# Patient Record
Sex: Male | Born: 1935 | Race: White | Hispanic: No | Smoking: Never smoker
Health system: Southern US, Community
[De-identification: ages and names within clinical notes are randomized; demographics above are authoritative.]

## PROBLEM LIST (undated history)

## (undated) DIAGNOSIS — Z9889 Other specified postprocedural states: Secondary | ICD-10-CM

## (undated) DIAGNOSIS — Z9049 Acquired absence of other specified parts of digestive tract: Secondary | ICD-10-CM

## (undated) DIAGNOSIS — I82409 Acute embolism and thrombosis of unspecified deep veins of unspecified lower extremity: Secondary | ICD-10-CM

## (undated) DIAGNOSIS — I639 Cerebral infarction, unspecified: Secondary | ICD-10-CM

## (undated) DIAGNOSIS — I6529 Occlusion and stenosis of unspecified carotid artery: Secondary | ICD-10-CM

## (undated) DIAGNOSIS — F0390 Unspecified dementia without behavioral disturbance: Secondary | ICD-10-CM

## (undated) DIAGNOSIS — E785 Hyperlipidemia, unspecified: Secondary | ICD-10-CM

## (undated) DIAGNOSIS — I2510 Atherosclerotic heart disease of native coronary artery without angina pectoris: Secondary | ICD-10-CM

## (undated) DIAGNOSIS — Z86711 Personal history of pulmonary embolism: Secondary | ICD-10-CM

## (undated) DIAGNOSIS — H269 Unspecified cataract: Secondary | ICD-10-CM

## (undated) DIAGNOSIS — Z8781 Personal history of (healed) traumatic fracture: Secondary | ICD-10-CM

## (undated) DIAGNOSIS — E039 Hypothyroidism, unspecified: Secondary | ICD-10-CM

## (undated) DIAGNOSIS — I350 Nonrheumatic aortic (valve) stenosis: Secondary | ICD-10-CM

## (undated) DIAGNOSIS — Z8619 Personal history of other infectious and parasitic diseases: Secondary | ICD-10-CM

## (undated) DIAGNOSIS — Z8679 Personal history of other diseases of the circulatory system: Secondary | ICD-10-CM

## (undated) DIAGNOSIS — R55 Syncope and collapse: Secondary | ICD-10-CM

## (undated) DIAGNOSIS — Z950 Presence of cardiac pacemaker: Secondary | ICD-10-CM

## (undated) HISTORY — PX: EYE SURGERY: SHX253

## (undated) HISTORY — DX: Other specified postprocedural states: Z98.890

## (undated) HISTORY — DX: Presence of cardiac pacemaker: Z95.0

## (undated) HISTORY — DX: Cerebral infarction, unspecified: I63.9

## (undated) HISTORY — DX: Nonrheumatic aortic (valve) stenosis: I35.0

## (undated) HISTORY — DX: Occlusion and stenosis of unspecified carotid artery: I65.29

## (undated) HISTORY — DX: Personal history of (healed) traumatic fracture: Z87.81

## (undated) HISTORY — DX: Personal history of pulmonary embolism: Z86.711

## (undated) HISTORY — PX: CARDIAC CATHETERIZATION: SHX172

## (undated) HISTORY — DX: Atherosclerotic heart disease of native coronary artery without angina pectoris: I25.10

## (undated) HISTORY — DX: Hyperlipidemia, unspecified: E78.5

## (undated) HISTORY — PX: VASCULAR SURGERY: SHX849

## (undated) HISTORY — DX: Acquired absence of other specified parts of digestive tract: Z90.49

## (undated) HISTORY — DX: Syncope and collapse: R55

## (undated) HISTORY — DX: Personal history of other diseases of the circulatory system: Z86.79

## (undated) HISTORY — DX: Hypothyroidism, unspecified: E03.9

## (undated) HISTORY — DX: Unspecified cataract: H26.9

## (undated) HISTORY — DX: Personal history of other infectious and parasitic diseases: Z86.19

## (undated) HISTORY — PX: BRAIN SURGERY: SHX531

---

## 1983-11-14 HISTORY — PX: AORTIC VALVE REPLACEMENT: SHX41

## 1998-08-23 ENCOUNTER — Encounter: Payer: Self-pay | Admitting: Cardiology

## 1998-08-23 ENCOUNTER — Inpatient Hospital Stay (HOSPITAL_COMMUNITY): Admission: AD | Admit: 1998-08-23 | Discharge: 1998-08-25 | Payer: Self-pay | Admitting: Cardiology

## 1998-08-24 ENCOUNTER — Encounter: Payer: Self-pay | Admitting: Pediatrics

## 1998-08-24 ENCOUNTER — Encounter: Payer: Self-pay | Admitting: Cardiology

## 2000-03-19 ENCOUNTER — Encounter: Payer: Self-pay | Admitting: Cardiology

## 2000-04-04 ENCOUNTER — Ambulatory Visit (HOSPITAL_COMMUNITY): Admission: RE | Admit: 2000-04-04 | Discharge: 2000-04-04 | Payer: Self-pay | Admitting: Cardiology

## 2003-03-11 ENCOUNTER — Ambulatory Visit (HOSPITAL_COMMUNITY): Admission: RE | Admit: 2003-03-11 | Discharge: 2003-03-11 | Payer: Self-pay | Admitting: Family Medicine

## 2003-03-11 ENCOUNTER — Encounter: Payer: Self-pay | Admitting: Family Medicine

## 2003-05-03 ENCOUNTER — Inpatient Hospital Stay (HOSPITAL_COMMUNITY): Admission: EM | Admit: 2003-05-03 | Discharge: 2003-05-06 | Payer: Self-pay | Admitting: Emergency Medicine

## 2003-05-03 ENCOUNTER — Encounter: Payer: Self-pay | Admitting: Emergency Medicine

## 2003-05-05 ENCOUNTER — Encounter: Payer: Self-pay | Admitting: Neurosurgery

## 2003-05-20 ENCOUNTER — Encounter: Payer: Self-pay | Admitting: Neurosurgery

## 2003-05-20 ENCOUNTER — Ambulatory Visit (HOSPITAL_COMMUNITY): Admission: RE | Admit: 2003-05-20 | Discharge: 2003-05-20 | Payer: Self-pay | Admitting: Neurosurgery

## 2003-06-03 ENCOUNTER — Ambulatory Visit (HOSPITAL_COMMUNITY): Admission: RE | Admit: 2003-06-03 | Discharge: 2003-06-03 | Payer: Self-pay | Admitting: Neurosurgery

## 2003-06-03 ENCOUNTER — Encounter: Payer: Self-pay | Admitting: Neurosurgery

## 2003-06-05 ENCOUNTER — Encounter: Payer: Self-pay | Admitting: Neurosurgery

## 2003-06-08 ENCOUNTER — Inpatient Hospital Stay (HOSPITAL_COMMUNITY): Admission: RE | Admit: 2003-06-08 | Discharge: 2003-06-15 | Payer: Self-pay | Admitting: Neurosurgery

## 2003-06-09 ENCOUNTER — Encounter: Payer: Self-pay | Admitting: Neurosurgery

## 2003-06-10 ENCOUNTER — Encounter: Payer: Self-pay | Admitting: Neurosurgery

## 2003-06-15 ENCOUNTER — Encounter: Payer: Self-pay | Admitting: Neurosurgery

## 2003-06-23 ENCOUNTER — Encounter: Payer: Self-pay | Admitting: Neurosurgery

## 2003-06-23 ENCOUNTER — Ambulatory Visit (HOSPITAL_COMMUNITY): Admission: RE | Admit: 2003-06-23 | Discharge: 2003-06-23 | Payer: Self-pay | Admitting: Neurosurgery

## 2003-06-25 ENCOUNTER — Inpatient Hospital Stay (HOSPITAL_COMMUNITY): Admission: EM | Admit: 2003-06-25 | Discharge: 2003-07-02 | Payer: Self-pay | Admitting: Emergency Medicine

## 2003-06-25 ENCOUNTER — Encounter: Payer: Self-pay | Admitting: Emergency Medicine

## 2003-06-29 ENCOUNTER — Encounter: Payer: Self-pay | Admitting: Cardiology

## 2003-07-08 ENCOUNTER — Encounter: Payer: Self-pay | Admitting: Neurosurgery

## 2003-07-08 ENCOUNTER — Inpatient Hospital Stay (HOSPITAL_COMMUNITY): Admission: RE | Admit: 2003-07-08 | Discharge: 2003-07-19 | Payer: Self-pay | Admitting: Neurosurgery

## 2003-07-13 ENCOUNTER — Encounter: Payer: Self-pay | Admitting: Neurosurgery

## 2003-07-14 ENCOUNTER — Encounter (INDEPENDENT_AMBULATORY_CARE_PROVIDER_SITE_OTHER): Payer: Self-pay | Admitting: Specialist

## 2003-07-14 ENCOUNTER — Encounter: Payer: Self-pay | Admitting: Neurosurgery

## 2003-07-15 ENCOUNTER — Encounter: Payer: Self-pay | Admitting: Neurosurgery

## 2003-07-17 ENCOUNTER — Encounter: Payer: Self-pay | Admitting: Neurosurgery

## 2003-07-19 ENCOUNTER — Encounter: Payer: Self-pay | Admitting: Neurosurgery

## 2003-07-30 ENCOUNTER — Inpatient Hospital Stay (HOSPITAL_COMMUNITY): Admission: EM | Admit: 2003-07-30 | Discharge: 2003-08-02 | Payer: Self-pay | Admitting: Emergency Medicine

## 2003-07-30 ENCOUNTER — Encounter: Payer: Self-pay | Admitting: Emergency Medicine

## 2003-07-31 ENCOUNTER — Encounter: Payer: Self-pay | Admitting: Cardiology

## 2003-08-02 ENCOUNTER — Encounter: Payer: Self-pay | Admitting: Neurosurgery

## 2003-08-05 ENCOUNTER — Encounter: Admission: RE | Admit: 2003-08-05 | Discharge: 2003-08-05 | Payer: Self-pay | Admitting: Family Medicine

## 2003-08-06 ENCOUNTER — Inpatient Hospital Stay (HOSPITAL_COMMUNITY): Admission: EM | Admit: 2003-08-06 | Discharge: 2003-08-21 | Payer: Self-pay | Admitting: Emergency Medicine

## 2003-08-10 ENCOUNTER — Encounter: Payer: Self-pay | Admitting: Family Medicine

## 2003-08-11 ENCOUNTER — Encounter: Payer: Self-pay | Admitting: Family Medicine

## 2003-08-17 ENCOUNTER — Encounter: Payer: Self-pay | Admitting: Family Medicine

## 2003-08-17 ENCOUNTER — Encounter: Payer: Self-pay | Admitting: Cardiology

## 2003-09-11 ENCOUNTER — Ambulatory Visit (HOSPITAL_COMMUNITY): Admission: RE | Admit: 2003-09-11 | Discharge: 2003-09-11 | Payer: Self-pay | Admitting: Neurosurgery

## 2003-10-30 ENCOUNTER — Ambulatory Visit (HOSPITAL_COMMUNITY): Admission: RE | Admit: 2003-10-30 | Discharge: 2003-10-30 | Payer: Self-pay | Admitting: Neurosurgery

## 2003-11-14 DIAGNOSIS — Z9049 Acquired absence of other specified parts of digestive tract: Secondary | ICD-10-CM

## 2003-11-14 HISTORY — DX: Acquired absence of other specified parts of digestive tract: Z90.49

## 2004-01-22 ENCOUNTER — Ambulatory Visit (HOSPITAL_COMMUNITY): Admission: RE | Admit: 2004-01-22 | Discharge: 2004-01-22 | Payer: Self-pay | Admitting: Neurosurgery

## 2004-07-21 ENCOUNTER — Ambulatory Visit (HOSPITAL_COMMUNITY): Admission: RE | Admit: 2004-07-21 | Discharge: 2004-07-21 | Payer: Self-pay | Admitting: Neurosurgery

## 2004-09-15 ENCOUNTER — Ambulatory Visit: Payer: Self-pay | Admitting: Internal Medicine

## 2004-09-29 ENCOUNTER — Ambulatory Visit: Payer: Self-pay | Admitting: Internal Medicine

## 2004-10-20 ENCOUNTER — Ambulatory Visit: Payer: Self-pay | Admitting: Cardiology

## 2004-10-20 ENCOUNTER — Inpatient Hospital Stay (HOSPITAL_COMMUNITY): Admission: EM | Admit: 2004-10-20 | Discharge: 2004-10-26 | Payer: Self-pay | Admitting: Emergency Medicine

## 2004-10-21 ENCOUNTER — Encounter: Payer: Self-pay | Admitting: Cardiology

## 2004-10-25 ENCOUNTER — Encounter (INDEPENDENT_AMBULATORY_CARE_PROVIDER_SITE_OTHER): Payer: Self-pay | Admitting: *Deleted

## 2004-10-31 ENCOUNTER — Ambulatory Visit: Payer: Self-pay | Admitting: Cardiovascular Disease

## 2004-10-31 ENCOUNTER — Ambulatory Visit: Payer: Self-pay

## 2004-11-21 ENCOUNTER — Ambulatory Visit: Payer: Self-pay | Admitting: Cardiology

## 2004-12-06 ENCOUNTER — Ambulatory Visit: Payer: Self-pay | Admitting: Cardiology

## 2004-12-16 ENCOUNTER — Ambulatory Visit: Payer: Self-pay | Admitting: Cardiovascular Disease

## 2004-12-30 ENCOUNTER — Ambulatory Visit: Payer: Self-pay | Admitting: Internal Medicine

## 2005-01-04 ENCOUNTER — Ambulatory Visit: Payer: Self-pay | Admitting: Cardiology

## 2005-01-23 ENCOUNTER — Ambulatory Visit: Payer: Self-pay | Admitting: Cardiology

## 2005-01-30 ENCOUNTER — Ambulatory Visit (HOSPITAL_COMMUNITY): Admission: RE | Admit: 2005-01-30 | Discharge: 2005-01-30 | Payer: Self-pay | Admitting: Family Medicine

## 2005-01-30 ENCOUNTER — Ambulatory Visit: Payer: Self-pay | Admitting: Cardiology

## 2005-02-07 ENCOUNTER — Ambulatory Visit: Payer: Self-pay | Admitting: Cardiology

## 2005-02-09 ENCOUNTER — Encounter: Admission: RE | Admit: 2005-02-09 | Discharge: 2005-03-21 | Payer: Self-pay | Admitting: Family Medicine

## 2005-02-14 ENCOUNTER — Ambulatory Visit: Payer: Self-pay | Admitting: Cardiology

## 2005-02-28 ENCOUNTER — Ambulatory Visit: Payer: Self-pay | Admitting: Cardiology

## 2005-03-16 ENCOUNTER — Ambulatory Visit: Payer: Self-pay | Admitting: Internal Medicine

## 2005-03-30 ENCOUNTER — Ambulatory Visit: Payer: Self-pay | Admitting: Cardiology

## 2005-04-14 ENCOUNTER — Ambulatory Visit: Payer: Self-pay | Admitting: *Deleted

## 2005-04-27 ENCOUNTER — Ambulatory Visit: Payer: Self-pay | Admitting: Internal Medicine

## 2005-05-15 ENCOUNTER — Inpatient Hospital Stay (HOSPITAL_COMMUNITY): Admission: RE | Admit: 2005-05-15 | Discharge: 2005-05-17 | Payer: Self-pay | Admitting: Orthopedic Surgery

## 2005-05-23 ENCOUNTER — Ambulatory Visit: Payer: Self-pay | Admitting: *Deleted

## 2005-05-24 ENCOUNTER — Encounter: Admission: RE | Admit: 2005-05-24 | Discharge: 2005-07-05 | Payer: Self-pay | Admitting: Orthopedic Surgery

## 2005-06-07 ENCOUNTER — Ambulatory Visit: Payer: Self-pay | Admitting: Cardiology

## 2005-06-22 ENCOUNTER — Ambulatory Visit: Payer: Self-pay | Admitting: Internal Medicine

## 2005-07-03 ENCOUNTER — Ambulatory Visit: Payer: Self-pay | Admitting: Cardiology

## 2005-07-06 ENCOUNTER — Encounter: Admission: RE | Admit: 2005-07-06 | Discharge: 2005-08-01 | Payer: Self-pay | Admitting: Orthopedic Surgery

## 2005-07-11 ENCOUNTER — Ambulatory Visit: Payer: Self-pay | Admitting: Cardiovascular Disease

## 2005-07-21 ENCOUNTER — Ambulatory Visit: Payer: Self-pay | Admitting: Internal Medicine

## 2005-08-02 ENCOUNTER — Ambulatory Visit: Payer: Self-pay | Admitting: Cardiology

## 2005-08-16 ENCOUNTER — Ambulatory Visit: Payer: Self-pay | Admitting: Cardiology

## 2005-09-06 ENCOUNTER — Ambulatory Visit: Payer: Self-pay | Admitting: Cardiology

## 2005-10-03 ENCOUNTER — Ambulatory Visit: Payer: Self-pay | Admitting: Cardiology

## 2005-10-17 ENCOUNTER — Ambulatory Visit: Payer: Self-pay | Admitting: Cardiology

## 2005-11-08 ENCOUNTER — Ambulatory Visit: Payer: Self-pay | Admitting: *Deleted

## 2005-12-06 ENCOUNTER — Ambulatory Visit: Payer: Self-pay | Admitting: Cardiology

## 2005-12-20 ENCOUNTER — Ambulatory Visit: Payer: Self-pay | Admitting: Cardiology

## 2005-12-29 ENCOUNTER — Ambulatory Visit: Payer: Self-pay | Admitting: Internal Medicine

## 2006-01-11 ENCOUNTER — Ambulatory Visit: Payer: Self-pay | Admitting: Cardiology

## 2006-02-08 ENCOUNTER — Ambulatory Visit: Payer: Self-pay | Admitting: Cardiology

## 2006-03-08 ENCOUNTER — Ambulatory Visit: Payer: Self-pay | Admitting: Cardiology

## 2006-03-14 ENCOUNTER — Encounter: Payer: Self-pay | Admitting: Cardiology

## 2006-04-05 ENCOUNTER — Ambulatory Visit: Payer: Self-pay | Admitting: Cardiology

## 2006-04-16 ENCOUNTER — Ambulatory Visit: Payer: Self-pay | Admitting: Cardiology

## 2006-05-15 ENCOUNTER — Ambulatory Visit: Payer: Self-pay | Admitting: Internal Medicine

## 2006-05-15 ENCOUNTER — Ambulatory Visit: Payer: Self-pay | Admitting: Cardiology

## 2006-05-29 ENCOUNTER — Ambulatory Visit: Payer: Self-pay | Admitting: Cardiology

## 2006-06-19 ENCOUNTER — Ambulatory Visit: Payer: Self-pay | Admitting: Cardiology

## 2006-07-10 ENCOUNTER — Ambulatory Visit: Payer: Self-pay | Admitting: *Deleted

## 2006-08-07 ENCOUNTER — Ambulatory Visit: Payer: Self-pay | Admitting: Cardiovascular Disease

## 2006-09-04 ENCOUNTER — Ambulatory Visit: Payer: Self-pay | Admitting: Internal Medicine

## 2006-10-02 ENCOUNTER — Ambulatory Visit: Payer: Self-pay | Admitting: Internal Medicine

## 2006-10-23 ENCOUNTER — Ambulatory Visit: Payer: Self-pay | Admitting: Cardiovascular Disease

## 2006-11-20 ENCOUNTER — Ambulatory Visit: Payer: Self-pay | Admitting: Cardiology

## 2006-12-18 ENCOUNTER — Ambulatory Visit: Payer: Self-pay | Admitting: Cardiology

## 2007-01-15 ENCOUNTER — Ambulatory Visit: Payer: Self-pay | Admitting: Cardiology

## 2007-02-12 ENCOUNTER — Ambulatory Visit: Payer: Self-pay | Admitting: Internal Medicine

## 2007-02-26 ENCOUNTER — Ambulatory Visit: Payer: Self-pay | Admitting: *Deleted

## 2007-03-26 ENCOUNTER — Ambulatory Visit: Payer: Self-pay | Admitting: Cardiology

## 2007-04-23 ENCOUNTER — Ambulatory Visit: Payer: Self-pay | Admitting: Cardiology

## 2007-05-14 ENCOUNTER — Ambulatory Visit: Payer: Self-pay | Admitting: Cardiology

## 2007-05-21 ENCOUNTER — Ambulatory Visit: Payer: Self-pay | Admitting: Cardiology

## 2007-05-21 LAB — CONVERTED CEMR LAB
ALT: 16 units/L (ref 0–53)
AST: 31 units/L (ref 0–37)
Albumin: 3.8 g/dL (ref 3.5–5.2)
Alkaline Phosphatase: 62 units/L (ref 39–117)
BUN: 13 mg/dL (ref 6–23)
Basophils Absolute: 0 10*3/uL (ref 0.0–0.1)
Basophils Relative: 0.1 % (ref 0.0–1.0)
Bilirubin, Direct: 0.1 mg/dL (ref 0.0–0.3)
CO2: 31 meq/L (ref 19–32)
Calcium: 8.7 mg/dL (ref 8.4–10.5)
Chloride: 109 meq/L (ref 96–112)
Cholesterol: 144 mg/dL (ref 0–200)
Creatinine, Ser: 1.1 mg/dL (ref 0.4–1.5)
Eosinophils Absolute: 0.1 10*3/uL (ref 0.0–0.6)
Eosinophils Relative: 1.9 % (ref 0.0–5.0)
GFR calc Af Amer: 84 mL/min
GFR calc non Af Amer: 69 mL/min
Glucose, Bld: 102 mg/dL — ABNORMAL HIGH (ref 70–99)
HCT: 36.7 % — ABNORMAL LOW (ref 39.0–52.0)
HDL: 50.4 mg/dL (ref >39.0)
Hemoglobin: 12.8 g/dL — ABNORMAL LOW (ref 13.0–17.0)
LDL Cholesterol: 82 mg/dL (ref 0–99)
Lymphocytes Relative: 22.4 % (ref 12.0–46.0)
MCHC: 35 g/dL (ref 30.0–36.0)
MCV: 97.5 fL (ref 78.0–100.0)
Monocytes Absolute: 0.4 10*3/uL (ref 0.2–0.7)
Monocytes Relative: 12.3 % — ABNORMAL HIGH (ref 3.0–11.0)
Neutro Abs: 2.1 10*3/uL (ref 1.4–7.7)
Neutrophils Relative %: 63.3 % (ref 43.0–77.0)
Platelets: 135 10*3/uL — ABNORMAL LOW (ref 150–400)
Potassium: 4.7 meq/L (ref 3.5–5.1)
RBC: 3.76 M/uL — ABNORMAL LOW (ref 4.22–5.81)
RDW: 12 % (ref 11.5–14.6)
Sodium: 142 meq/L (ref 135–145)
TSH: 3.25 microintl units/mL (ref 0.35–5.50)
Total Bilirubin: 1.2 mg/dL (ref 0.3–1.2)
Total CHOL/HDL Ratio: 2.9
Total Protein: 6.8 g/dL (ref 6.0–8.3)
Triglycerides: 57 mg/dL (ref 0–149)
VLDL: 11 mg/dL (ref 0–40)
WBC: 3.4 10*3/uL — ABNORMAL LOW (ref 4.5–10.5)

## 2007-06-17 ENCOUNTER — Ambulatory Visit: Payer: Self-pay | Admitting: Internal Medicine

## 2007-07-16 ENCOUNTER — Ambulatory Visit: Payer: Self-pay | Admitting: Cardiology

## 2007-07-29 ENCOUNTER — Ambulatory Visit: Payer: Self-pay | Admitting: Internal Medicine

## 2007-08-19 ENCOUNTER — Ambulatory Visit: Payer: Self-pay | Admitting: Internal Medicine

## 2007-09-16 ENCOUNTER — Ambulatory Visit: Payer: Self-pay | Admitting: Cardiovascular Disease

## 2007-09-26 ENCOUNTER — Ambulatory Visit: Payer: Self-pay | Admitting: Cardiology

## 2007-10-15 ENCOUNTER — Ambulatory Visit: Payer: Self-pay | Admitting: Cardiology

## 2007-10-29 ENCOUNTER — Ambulatory Visit: Payer: Self-pay | Admitting: Internal Medicine

## 2007-11-26 ENCOUNTER — Ambulatory Visit: Payer: Self-pay | Admitting: Cardiology

## 2007-12-17 ENCOUNTER — Ambulatory Visit: Payer: Self-pay | Admitting: Cardiology

## 2008-01-14 ENCOUNTER — Ambulatory Visit: Payer: Self-pay | Admitting: Internal Medicine

## 2008-02-11 ENCOUNTER — Ambulatory Visit: Payer: Self-pay | Admitting: Internal Medicine

## 2008-03-10 ENCOUNTER — Ambulatory Visit: Payer: Self-pay | Admitting: Cardiology

## 2008-04-07 ENCOUNTER — Ambulatory Visit: Payer: Self-pay | Admitting: Cardiology

## 2008-05-05 ENCOUNTER — Ambulatory Visit: Payer: Self-pay | Admitting: Cardiovascular Disease

## 2008-05-13 ENCOUNTER — Ambulatory Visit: Payer: Self-pay | Admitting: Cardiology

## 2008-05-19 ENCOUNTER — Ambulatory Visit: Payer: Self-pay | Admitting: Cardiovascular Disease

## 2008-05-19 ENCOUNTER — Ambulatory Visit: Payer: Self-pay | Admitting: Cardiology

## 2008-05-19 LAB — CONVERTED CEMR LAB
ALT: 14 units/L (ref 0–53)
AST: 30 units/L (ref 0–37)
Albumin: 3.9 g/dL (ref 3.5–5.2)
Alkaline Phosphatase: 63 units/L (ref 39–117)
BUN: 12 mg/dL (ref 6–23)
Basophils Absolute: 0 10*3/uL (ref 0.0–0.1)
Basophils Relative: 0.7 % (ref 0.0–1.0)
Bilirubin, Direct: 0.1 mg/dL (ref 0.0–0.3)
CO2: 29 meq/L (ref 19–32)
Calcium: 8.6 mg/dL (ref 8.4–10.5)
Chloride: 103 meq/L (ref 96–112)
Cholesterol: 132 mg/dL (ref 0–200)
Creatinine, Ser: 1.1 mg/dL (ref 0.4–1.5)
Eosinophils Absolute: 0.1 10*3/uL (ref 0.0–0.7)
Eosinophils Relative: 1.7 % (ref 0.0–5.0)
GFR calc Af Amer: 83 mL/min
GFR calc non Af Amer: 69 mL/min
Glucose, Bld: 103 mg/dL — ABNORMAL HIGH (ref 70–99)
HCT: 37.4 % — ABNORMAL LOW (ref 39.0–52.0)
HDL: 48.2 mg/dL (ref >39.0)
Hemoglobin: 13.2 g/dL (ref 13.0–17.0)
LDL Cholesterol: 73 mg/dL (ref 0–99)
Lymphocytes Relative: 25.1 % (ref 12.0–46.0)
MCHC: 35.4 g/dL (ref 30.0–36.0)
MCV: 96.1 fL (ref 78.0–100.0)
Monocytes Absolute: 0.3 10*3/uL (ref 0.1–1.0)
Monocytes Relative: 10.2 % (ref 3.0–12.0)
Neutro Abs: 1.8 10*3/uL (ref 1.4–7.7)
Neutrophils Relative %: 62.3 % (ref 43.0–77.0)
Platelets: 138 10*3/uL — ABNORMAL LOW (ref 150–400)
Potassium: 4.3 meq/L (ref 3.5–5.1)
RBC: 3.89 M/uL — ABNORMAL LOW (ref 4.22–5.81)
RDW: 12.2 % (ref 11.5–14.6)
Sodium: 137 meq/L (ref 135–145)
TSH: 3.5 microintl units/mL (ref 0.35–5.50)
Total Bilirubin: 1.1 mg/dL (ref 0.3–1.2)
Total CHOL/HDL Ratio: 2.7
Total Protein: 7 g/dL (ref 6.0–8.3)
Triglycerides: 54 mg/dL (ref 0–149)
VLDL: 11 mg/dL (ref 0–40)
WBC: 3 10*3/uL — ABNORMAL LOW (ref 4.5–10.5)

## 2008-06-16 ENCOUNTER — Ambulatory Visit: Payer: Self-pay | Admitting: Cardiology

## 2008-07-15 ENCOUNTER — Ambulatory Visit: Payer: Self-pay | Admitting: Internal Medicine

## 2008-08-12 ENCOUNTER — Ambulatory Visit: Payer: Self-pay | Admitting: Cardiovascular Disease

## 2008-09-09 ENCOUNTER — Ambulatory Visit: Payer: Self-pay | Admitting: Cardiology

## 2008-10-07 ENCOUNTER — Ambulatory Visit: Payer: Self-pay | Admitting: Cardiovascular Disease

## 2008-11-04 ENCOUNTER — Ambulatory Visit: Payer: Self-pay | Admitting: Cardiovascular Disease

## 2008-12-02 ENCOUNTER — Ambulatory Visit: Payer: Self-pay | Admitting: Internal Medicine

## 2008-12-30 ENCOUNTER — Ambulatory Visit: Payer: Self-pay | Admitting: Cardiology

## 2009-01-27 ENCOUNTER — Ambulatory Visit: Payer: Self-pay | Admitting: Cardiology

## 2009-02-24 ENCOUNTER — Ambulatory Visit: Payer: Self-pay | Admitting: Internal Medicine

## 2009-03-24 ENCOUNTER — Ambulatory Visit: Payer: Self-pay | Admitting: Cardiology

## 2009-04-13 ENCOUNTER — Encounter: Payer: Self-pay | Admitting: *Deleted

## 2009-04-21 ENCOUNTER — Ambulatory Visit: Payer: Self-pay | Admitting: Cardiology

## 2009-04-21 LAB — CONVERTED CEMR LAB
POC INR: 2.8
Protime: 20.1

## 2009-05-12 ENCOUNTER — Ambulatory Visit: Payer: Self-pay | Admitting: Internal Medicine

## 2009-05-12 LAB — CONVERTED CEMR LAB
POC INR: 2.3
Prothrombin Time: 18.4 s

## 2009-05-19 ENCOUNTER — Encounter: Payer: Self-pay | Admitting: *Deleted

## 2009-06-05 DIAGNOSIS — E785 Hyperlipidemia, unspecified: Secondary | ICD-10-CM | POA: Insufficient documentation

## 2009-06-08 DIAGNOSIS — Z9889 Other specified postprocedural states: Secondary | ICD-10-CM | POA: Insufficient documentation

## 2009-06-08 DIAGNOSIS — I82409 Acute embolism and thrombosis of unspecified deep veins of unspecified lower extremity: Secondary | ICD-10-CM | POA: Insufficient documentation

## 2009-06-08 DIAGNOSIS — I6200 Nontraumatic subdural hemorrhage, unspecified: Secondary | ICD-10-CM | POA: Insufficient documentation

## 2009-06-09 ENCOUNTER — Encounter: Payer: Self-pay | Admitting: Cardiology

## 2009-06-09 ENCOUNTER — Ambulatory Visit: Payer: Self-pay | Admitting: Cardiology

## 2009-06-09 DIAGNOSIS — I2510 Atherosclerotic heart disease of native coronary artery without angina pectoris: Secondary | ICD-10-CM

## 2009-06-09 LAB — CONVERTED CEMR LAB
POC INR: 1.8
Prothrombin Time: 16.7 s

## 2009-07-07 ENCOUNTER — Ambulatory Visit: Payer: Self-pay | Admitting: Internal Medicine

## 2009-07-07 LAB — CONVERTED CEMR LAB: POC INR: 2.7

## 2009-08-02 ENCOUNTER — Ambulatory Visit: Payer: Self-pay | Admitting: Cardiology

## 2009-08-02 LAB — CONVERTED CEMR LAB: POC INR: 2.6

## 2009-08-30 ENCOUNTER — Ambulatory Visit: Payer: Self-pay | Admitting: Cardiovascular Disease

## 2009-08-30 LAB — CONVERTED CEMR LAB: POC INR: 2.2

## 2009-09-27 ENCOUNTER — Ambulatory Visit: Payer: Self-pay | Admitting: Cardiology

## 2009-09-27 LAB — CONVERTED CEMR LAB: POC INR: 2.3

## 2009-10-25 ENCOUNTER — Ambulatory Visit: Payer: Self-pay | Admitting: Cardiovascular Disease

## 2009-10-25 LAB — CONVERTED CEMR LAB: POC INR: 2.1

## 2009-11-02 ENCOUNTER — Telehealth: Payer: Self-pay | Admitting: Cardiology

## 2009-11-15 ENCOUNTER — Telehealth: Payer: Self-pay | Admitting: Cardiology

## 2009-11-22 ENCOUNTER — Encounter (INDEPENDENT_AMBULATORY_CARE_PROVIDER_SITE_OTHER): Payer: Self-pay | Admitting: Cardiology

## 2009-11-22 ENCOUNTER — Ambulatory Visit: Payer: Self-pay | Admitting: Internal Medicine

## 2009-12-20 ENCOUNTER — Ambulatory Visit: Payer: Self-pay | Admitting: Cardiovascular Disease

## 2010-01-10 ENCOUNTER — Encounter (INDEPENDENT_AMBULATORY_CARE_PROVIDER_SITE_OTHER): Payer: Self-pay | Admitting: Cardiology

## 2010-01-10 ENCOUNTER — Ambulatory Visit: Payer: Self-pay | Admitting: Cardiology

## 2010-02-07 ENCOUNTER — Ambulatory Visit: Payer: Self-pay | Admitting: Cardiovascular Disease

## 2010-02-07 LAB — CONVERTED CEMR LAB: POC INR: 2.1

## 2010-03-07 ENCOUNTER — Ambulatory Visit: Payer: Self-pay | Admitting: Cardiovascular Disease

## 2010-04-04 ENCOUNTER — Ambulatory Visit: Payer: Self-pay | Admitting: Cardiology

## 2010-05-02 ENCOUNTER — Ambulatory Visit: Payer: Self-pay | Admitting: Cardiology

## 2010-05-02 LAB — CONVERTED CEMR LAB: POC INR: 1.9

## 2010-05-30 ENCOUNTER — Ambulatory Visit: Payer: Self-pay | Admitting: Cardiology

## 2010-05-30 LAB — CONVERTED CEMR LAB: POC INR: 2

## 2010-06-07 ENCOUNTER — Ambulatory Visit: Payer: Self-pay | Admitting: Cardiology

## 2010-06-27 ENCOUNTER — Ambulatory Visit: Payer: Self-pay | Admitting: Cardiology

## 2010-07-25 ENCOUNTER — Ambulatory Visit: Payer: Self-pay | Admitting: Internal Medicine

## 2010-07-25 LAB — CONVERTED CEMR LAB: POC INR: 2.1

## 2010-08-22 ENCOUNTER — Ambulatory Visit: Payer: Self-pay | Admitting: Internal Medicine

## 2010-08-22 LAB — CONVERTED CEMR LAB: POC INR: 2.3

## 2010-09-15 ENCOUNTER — Ambulatory Visit: Payer: Self-pay | Admitting: Cardiology

## 2010-10-13 ENCOUNTER — Ambulatory Visit: Payer: Self-pay | Admitting: Cardiovascular Disease

## 2010-11-10 ENCOUNTER — Ambulatory Visit: Payer: Self-pay | Admitting: Cardiology

## 2010-11-13 DIAGNOSIS — I639 Cerebral infarction, unspecified: Secondary | ICD-10-CM

## 2010-11-13 HISTORY — DX: Cerebral infarction, unspecified: I63.9

## 2010-12-07 ENCOUNTER — Ambulatory Visit (HOSPITAL_COMMUNITY)
Admission: RE | Admit: 2010-12-07 | Discharge: 2010-12-07 | Payer: Self-pay | Source: Home / Self Care | Attending: Family Medicine | Admitting: Family Medicine

## 2010-12-07 ENCOUNTER — Ambulatory Visit: Admission: RE | Admit: 2010-12-07 | Discharge: 2010-12-07 | Payer: Self-pay | Source: Home / Self Care

## 2010-12-07 LAB — CONVERTED CEMR LAB: POC INR: 2

## 2010-12-13 NOTE — Medication Information (Signed)
Summary: rov/sak  Anticoagulant Therapy  Managed by: Reina Fuse, PharmD Referring MD: Charlies Constable MD PCP: [facility] pratice-dr Elana Alm Supervising MD: Tenny Craw MD, Gunnar Fusi Indication 1: Aortic Valve Replacement (ICD-V43.3) Indication 2: Aortic Valve Disorder (ICD-424.1) Lab Used: [facility] Site: [facility] INR POC 2.3 INR RANGE 2 - 2.5  Dietary changes: no    Health status changes: no    Bleeding/hemorrhagic complications: no    Recent/future hospitalizations: no    Any changes in medication regimen? no    Recent/future dental: no  Any missed doses?: no       Is patient compliant with meds? yes       Current Medications (verified): 1)  Coumadin 3 Mg Tabs (Warfarin Sodium) .... Take As Directed By Coumadin Clinic. 2)  Flomax 0.4 Mg Caps (Tamsulosin Hcl) .Marland Kitchen.. 1 Tab  Day 3)  Vytorin 10-40 Mg Tabs (Ezetimibe-Simvastatin) .Marland Kitchen.. 1 Tab A Day 4)  Levoxyl 50 Mcg Tabs (Levothyroxine Sodium) .Marland Kitchen.. 1 Tab A Day 5)  Multivitamins   Tabs (Multiple Vitamin)  Allergies (verified): 1)  ! Sulfa  Anticoagulation Management History:      The patient is taking warfarin and comes in today for a routine follow up visit.  Positive risk factors for bleeding include an age of 75 years or older.  The bleeding index is 'intermediate risk'.  Positive CHADS2 values include Age > 48 years old.  The start date was 02/25/1998.  Anticoagulation responsible provider: Tenny Craw MD, Gunnar Fusi.  INR POC: 2.3.  Cuvette Lot#: [id number].  Exp: 09/2011.    Anticoagulation Management Assessment/Plan:      The patient's current anticoagulation dose is Coumadin 3 mg tabs: Take as directed by coumadin clinic..  The target INR is 2.0-2.5.  The next INR is due 09/19/2010.  Anticoagulation instructions were given to patient.  Results were reviewed/authorized by Reina Fuse, PharmD.  He was notified by Reina Fuse, PharmD.         Prior Anticoagulation Instructions: INR 2.1  Continue taking 1 tablet (3 mg) every day except 1/2  tablet (1.5 mg) on Monday's and Friday's.  Recheck in 4 wks  Current Anticoagulation Instructions: INR 2.3  Continue taking Coumadin 1 tab (3 mg) on all days except Coumadin 0.5 tab (1.5 mg) on Mondays and Fridays. Return to clinic in 4 weeks.

## 2010-12-13 NOTE — Medication Information (Signed)
Summary: rov/tm  Anticoagulant Therapy  Managed by: Weston Brass, PharmD Referring MD: Charlies Constable MD PCP: [facility] family pratice-dr Elana Alm Supervising MD: Shirlee Latch MD, Dalton Indication 1: Aortic Valve Replacement (ICD-V43.3) Indication 2: Aortic Valve Disorder (ICD-424.1) Lab Used: [facility] Site: [facility] INR POC 1.9 INR RANGE 2 - 2.5  Dietary changes: no    Health status changes: no    Bleeding/hemorrhagic complications: no    Recent/future hospitalizations: no    Any changes in medication regimen? no    Recent/future dental: no  Any missed doses?: no       Is patient compliant with meds? yes       Allergies: 1)  ! Sulfa  Anticoagulation Management History:      The patient is taking warfarin and comes in today for a routine follow up visit.  Positive risk factors for bleeding include an age of 29 years or older.  The bleeding index is 'intermediate risk'.  Positive CHADS2 values include Age > 71 years old.  The start date was 02/25/1998.  Anticoagulation responsible provider: Shirlee Latch MD, Dalton.  INR POC: 1.9.  Cuvette Lot#: [id number].  Exp: 08/2011.    Anticoagulation Management Assessment/Plan:      The patient's current anticoagulation dose is Coumadin 3 mg tabs: Take as directed by coumadin clinic..  The target INR is 2.0-2.5.  The next INR is due 07/25/2010.  Anticoagulation instructions were given to patient.  Results were reviewed/authorized by Weston Brass, PharmD.  He was notified by Gweneth Fritter, PharmD Candidate.         Prior Anticoagulation Instructions: INR 2.0 Continue 3mg s daily except 1.5mg s on Mondays and Fridays. Recheck in 4 weeks.   Current Anticoagulation Instructions: INR 1.9  Take 1 tablet (3mg ) tonight.  Then resume normal schedule of taking 1 tablet (3mg ) every day except take 1/2 tablet (1.5mg ) on Mondays and Fridays.  Recheck in 4 weeks.

## 2010-12-13 NOTE — Medication Information (Signed)
Summary: rov/ewj  Anticoagulant Therapy  Managed by: Bethena Midget, RN, BSN Referring MD: Charlies Constable MD PCP: [facility] family pratice-dr Tomasita Crumble MD: Antoine Poche MD, Fayrene Fearing Indication 1: Aortic Valve Replacement (ICD-V43.3) Indication 2: Aortic Valve Disorder (ICD-424.1) Lab Used[facility]  Site: [address] INR POC 2.0 INR RANGE 2 - 2.5  Dietary changes: no    Health status changes: no    Bleeding/hemorrhagic complications: no    Recent/future hospitalizations: no    Any changes in medication regimen? no    Recent/future dental: no  Any missed doses?: no       Is patient compliant with meds? yes       Allergies: 1)  ! Sulfa  Anticoagulation Management History:      The patient comes in today for his initial visit for anticoagulation therapy.  Positive risk factors for bleeding include an age of 75 years or older.  The bleeding index is 'intermediate risk'.  Positive CHADS2 values include Age > 86 years old.  The start date was 02/25/1998.  Anticoagulation responsible provider: Antoine Poche MD, Fayrene Fearing.  INR POC: 2.0.  Cuvette Lot#: [id number].  Exp: 08/2011.    Anticoagulation Management Assessment/Plan:      The patient's current anticoagulation dose is Coumadin 3 mg tabs: Take as directed by coumadin clinic..  The target INR is 2.0-2.5.  The next INR is due 06/27/2010.  Anticoagulation instructions were given to patient.  Results were reviewed/authorized by Bethena Midget, RN, BSN.  He was notified by Bethena Midget, RN, BSN.         Prior Anticoagulation Instructions: INR 1.9  Take 1 tablet today, then resume same dosage 1 tablet daily except 1/2 tablet on Mondays and Fridays.  Recheck in 4 weeks.    Current Anticoagulation Instructions: INR 2.0 Continue 3mg s daily except 1.5mg s on Mondays and Fridays. Recheck in 4 weeks.

## 2010-12-13 NOTE — Medication Information (Signed)
Summary: ROV/SP  Anticoagulant Therapy  Managed by: Eda Keys, PharmD Referring MD: Charlies Constable MD PCP: [facility] family pratice-dr Elana Alm Supervising MD: Jens Som MD, Arlys John Indication 1: Aortic Valve Replacement (ICD-V43.3) Indication 2: Aortic Valve Disorder (ICD-424.1) Lab Used: [facility] Site: [facility] INR POC 2.2 INR RANGE 2 - 2.5  Dietary changes: no    Health status changes: no    Bleeding/hemorrhagic complications: no    Recent/future hospitalizations: no    Any changes in medication regimen? no    Recent/future dental: no  Any missed doses?: no       Is patient compliant with meds? yes       Allergies: 1)  ! Sulfa  Anticoagulation Management History:      The patient is taking warfarin and comes in today for a routine follow up visit.  Positive risk factors for bleeding include an age of 75 years or older.  The bleeding index is 'intermediate risk'.  Positive CHADS2 values include Age > 75 years old.  The start date was 02/25/1998.  Anticoagulation responsible provider: Jens Som MD, Arlys John.  INR POC: 2.2.  Cuvette Lot#: [id number].  Exp: 06/2011.    Anticoagulation Management Assessment/Plan:      The patient's current anticoagulation dose is Coumadin 3 mg tabs: Take as directed by coumadin clinic..  The target INR is 2.0-2.5.  The next INR is due 05/02/2010.  Anticoagulation instructions were given to patient.  Results were reviewed/authorized by Eda Keys, PharmD.  He was notified by Eda Keys.         Prior Anticoagulation Instructions: INR 2.1  Continue same dose of 1 tablet every day except 1/2 tablet on Monday and Friday   Current Anticoagulation Instructions: INR 2.2  Take 1/2 tablet on Monday and Friday and 1 tablet all other days.  Return to clinic in 4 weeks.

## 2010-12-13 NOTE — Medication Information (Signed)
Summary: rov/tm  Anticoagulant Therapy  Managed by: Weston Brass, PharmD Referring MD: Charlies Constable MD PCP: [facility] pratice-dr Elana Alm Supervising MD: Excell Seltzer MD, Casimiro Needle Indication 1: Aortic Valve Replacement (ICD-V43.3) Indication 2: Aortic Valve Disorder (ICD-424.1) Lab Used: [facility] Site: [facility] INR POC 2.1 INR RANGE 2 - 2.5  Dietary changes: no    Health status changes: no    Bleeding/hemorrhagic complications: no    Recent/future hospitalizations: no    Any changes in medication regimen? no    Recent/future dental: no  Any missed doses?: no       Is patient compliant with meds? yes       Allergies: 1)  ! Sulfa  Anticoagulation Management History:      The patient is taking warfarin and comes in today for a routine follow up visit.  Positive risk factors for bleeding include an age of 75 years or older.  The bleeding index is 'intermediate risk'.  Positive CHADS2 values include Age > 75 years old.  The start date was 02/25/1998.  Anticoagulation responsible provider: Excell Seltzer MD, Casimiro Needle.  INR POC: 2.1.  Exp: 03/2011.    Anticoagulation Management Assessment/Plan:      The patient's current anticoagulation dose is Coumadin 3 mg tabs: Take as directed by coumadin clinic..  The target INR is 2.0-2.5.  The next INR is due 04/04/2010.  Anticoagulation instructions were given to patient.  Results were reviewed/authorized by Weston Brass, PharmD.  He was notified by Weston Brass PharmD.         Prior Anticoagulation Instructions: INR 2.1 Continue 3mg s everyday except 1.5mg s on Mondays and Fridays. Recheck in 4 weeks.   Current Anticoagulation Instructions: INR 2.1  Continue same dose of 1 tablet every day except 1/2 tablet on Monday and Friday

## 2010-12-13 NOTE — Letter (Signed)
Summary: Handout Printed  Printed Handout:  - Coumadin Instructions-w/out Meds 

## 2010-12-13 NOTE — Medication Information (Signed)
Summary: rov/eac  Anticoagulant Therapy  Managed by: Cloyde Reams, RN, BSN Referring MD: Charlies Constable MD PCP: [facility] family pratice-dr Tomasita Crumble MD: Shirlee Latch MD, Keven Soucy Indication 1: Aortic Valve Replacement (ICD-V43.3) Indication 2: Aortic Valve Disorder (ICD-424.1) Lab Used: [facility] Site: [address] INR POC 1.9 INR RANGE 2 - 2.5  Dietary changes: yes       Details: Incr vegetables from garden.   Health status changes: no    Bleeding/hemorrhagic complications: no    Recent/future hospitalizations: no    Any changes in medication regimen? no    Recent/future dental: no  Any missed doses?: no       Is patient compliant with meds? yes       Allergies: 1)  ! Sulfa  Anticoagulation Management History:      The patient is taking warfarin and comes in today for a routine follow up visit.  Positive risk factors for bleeding include an age of 75 years or older.  The bleeding index is 'intermediate risk'.  Positive CHADS2 values include Age > 75 years old.  The start date was 02/25/1998.  Anticoagulation responsible provider: Shirlee Latch MD, Joclyn Alsobrook.  INR POC: 1.9.  Cuvette Lot#: [id number].  Exp: 07/2011.    Anticoagulation Management Assessment/Plan:      The patient's current anticoagulation dose is Coumadin 3 mg tabs: Take as directed by coumadin clinic..  The target INR is 2.0-2.5.  The next INR is due 05/30/2010.  Anticoagulation instructions were given to patient.  Results were reviewed/authorized by Cloyde Reams, RN, BSN.  He was notified by Cloyde Reams RN.         Prior Anticoagulation Instructions: INR 2.2  Take 1/2 tablet on Monday and Friday and 1 tablet all other days.  Return to clinic in 4 weeks.      Current Anticoagulation Instructions: INR 1.9  Take 1 tablet today, then resume same dosage 1 tablet daily except 1/2 tablet on Mondays and Fridays.  Recheck in 4 weeks.

## 2010-12-13 NOTE — Medication Information (Signed)
Summary: [email or web address].mp  Anticoagulant Therapy  Managed by: Bethena Midget, RN, BSN Referring MD: Charlies Constable MD PCP: [facility] family pratice-dr Tomasita Crumble MD: Clifton James MD, Cristal Deer Indication 1: Aortic Valve Replacement (ICD-V43.3) Indication 2: Aortic Valve Disorder (ICD-424.1) Lab Used: [facility] Site: [address] INR POC 2.1 INR RANGE 2 - 2.5  Dietary changes: no    Health status changes: no    Bleeding/hemorrhagic complications: no    Recent/future hospitalizations: no    Any changes in medication regimen? no    Recent/future dental: no  Any missed doses?: no       Is patient compliant with meds? yes       Allergies: 1)  ! Sulfa  Anticoagulation Management History:      The patient is taking warfarin and comes in today for a routine follow up visit.  Positive risk factors for bleeding include an age of 76 years or older.  The bleeding index is 'intermediate risk'.  Positive CHADS2 values include Age > 1 years old.  The start date was 02/25/1998.  Anticoagulation responsible provider: Clifton James MD, Cristal Deer.  INR POC: 2.1.  Cuvette Lot#: [id number].  Exp: 03/2011.    Anticoagulation Management Assessment/Plan:      The patient's current anticoagulation dose is Coumadin 3 mg tabs: Take as directed by coumadin clinic..  The target INR is 2.0-2.5.  The next INR is due 03/07/2010.  Anticoagulation instructions were given to patient.  Results were reviewed/authorized by Bethena Midget, RN, BSN.  He was notified by Bethena Midget, RN, BSN.         Prior Anticoagulation Instructions: INR 2.3  Continue 0.5 tab each Monday and Friday and 1 tab on all other days.  Recheck in 4 weeks.   Current Anticoagulation Instructions: INR 2.1 Continue 3mg s everyday except 1.5mg s on Mondays and Fridays. Recheck in 4 weeks.

## 2010-12-13 NOTE — Medication Information (Signed)
Summary: rov/ewj  Anticoagulant Therapy  Managed by: Weston Brass, PharmD Referring MD: Charlies Constable MD PCP: [facility] family pratice-dr Elana Alm Supervising MD: Eden Emms MD, Theron Arista Indication 1: Aortic Valve Replacement (ICD-V43.3) Indication 2: Aortic Valve Disorder (ICD-424.1) Lab Used: [facility] Site: [facility] INR POC 1.8 INR RANGE 2 - 2.5  Dietary changes: no    Health status changes: no    Bleeding/hemorrhagic complications: no    Recent/future hospitalizations: no    Any changes in medication regimen? no    Recent/future dental: no  Any missed doses?: no       Is patient compliant with meds? yes       Allergies: 1)  ! Sulfa  Anticoagulation Management History:      The patient is taking warfarin and comes in today for a routine follow up visit.  Positive risk factors for bleeding include an age of 75 years or older.  The bleeding index is 'intermediate risk'.  Positive CHADS2 values include Age > 75 years old.  The start date was 02/25/1998.  Anticoagulation responsible provider: Eden Emms MD, Theron Arista.  INR POC: 1.8.  Cuvette Lot#: [id number].  Exp: 10/2011.    Anticoagulation Management Assessment/Plan:      The patient's current anticoagulation dose is Coumadin 3 mg tabs: Take as directed by coumadin clinic..  The target INR is 2.0-2.5.  The next INR is due 11/10/2010.  Anticoagulation instructions were given to patient.  Results were reviewed/authorized by Weston Brass, PharmD.  He was notified by Weston Brass PharmD.         Prior Anticoagulation Instructions: INR 2.0  Continue on same dosage 1 tablet daily except 1/2 tablet on Mondays and Fridays.  Recheck in 4 weeks.   Current Anticoagulation Instructions: INR 1.8  Take 1 1/2 tablets today then resume same dose of 1 tablet every day except 1/2 tablet on Monday and Friday.  Recheck INR in 4 weeks.

## 2010-12-13 NOTE — Medication Information (Signed)
Summary: [email or web address]  Anticoagulant Therapy  Managed by: Cloyde Reams, RN, BSN Referring MD: Charlies Constable MD PCP: [facility] family pratice-dr Tomasita Crumble MD: Excell Seltzer MD, Casimiro Needle Indication 1: Aortic Valve Replacement (ICD-V43.3) Indication 2: Aortic Valve Disorder (ICD-424.1) Lab Used: [facility] Site: [address] INR POC 3.0 INR RANGE 2 - 2.5   Health status changes: yes       Details: Had flu last week.    Bleeding/hemorrhagic complications: no    Recent/future hospitalizations: no    Any changes in medication regimen? yes       Details: Took Tamiflu, cough medication rx.    Recent/future dental: no  Any missed doses?: no       Is patient compliant with meds? yes       Allergies (verified): 1)  ! Sulfa  Anticoagulation Management History:      The patient is taking warfarin and comes in today for a routine follow up visit.  Positive risk factors for bleeding include an age of 75 years or older.  The bleeding index is 'intermediate risk'.  Positive CHADS2 values include Age > 65 years old.  The start date was 02/25/1998.  Anticoagulation responsible provider: Excell Seltzer MD, Casimiro Needle.  INR POC: 3.0.  Cuvette Lot#: [id number].  Exp: 02/2011.    Anticoagulation Management Assessment/Plan:      The patient's current anticoagulation dose is Coumadin 3 mg tabs: Take as directed by coumadin clinic..  The target INR is 2.0-2.5.  The next INR is due 01/10/2010.  Anticoagulation instructions were given to patient.  Results were reviewed/authorized by Cloyde Reams, RN, BSN.  He was notified by Cloyde Reams RN.         Prior Anticoagulation Instructions: INR 2.1  Continue 0.5 tab each Monday and Friday and 1 tab on all other days.  Recheck in 4 weeks.   Current Anticoagulation Instructions: INR 3.0  Skip today's dosage of coumadin then resume same dosage 1 tablet daily except 1/2 tablet on Mondays and Fridays.  Recheck in 3 weeks.

## 2010-12-13 NOTE — Medication Information (Signed)
Summary: rov/tm  Anticoagulant Therapy  Managed by: Shelby Dubin, PharmD, BCPS, CPP Referring MD: Charlies Constable MD PCP: [facility] family pratice-dr Elana Alm Supervising MD: Tenny Craw MD, Gunnar Fusi Indication 1: Aortic Valve Replacement (ICD-V43.3) Indication 2: Aortic Valve Disorder (ICD-424.1) Lab Used: [facility] Site: [facility] INR POC 2.1 INR RANGE 2 - 2.5  Dietary changes: no    Health status changes: no    Bleeding/hemorrhagic complications: no    Recent/future hospitalizations: no    Any changes in medication regimen? no    Recent/future dental: no  Any missed doses?: no       Is patient compliant with meds? yes       Current Medications (verified): 1)  Coumadin 3 Mg Tabs (Warfarin Sodium) .... Take As Directed By Coumadin Clinic. 2)  Flomax 0.4 Mg Caps (Tamsulosin Hcl) .Marland Kitchen.. 1 Tab  Day 3)  Vytorin 10-40 Mg Tabs (Ezetimibe-Simvastatin) .Marland Kitchen.. 1 Tab A Day 4)  Levoxyl 50 Mcg Tabs (Levothyroxine Sodium) .Marland Kitchen.. 1 Tab A Day 5)  Multivitamins   Tabs (Multiple Vitamin)  Allergies (verified): 1)  ! Sulfa  Anticoagulation Management History:      The patient is taking warfarin and comes in today for a routine follow up visit.  Positive risk factors for bleeding include an age of 75 years or older.  The bleeding index is 'intermediate risk'.  Positive CHADS2 values include Age > 75 years old.  The start date was 02/25/1998.  Anticoagulation responsible provider: Tenny Craw MD, Gunnar Fusi.  INR POC: 2.1.  Cuvette Lot#: [id number].  Exp: 12/2010.    Anticoagulation Management Assessment/Plan:      The patient's current anticoagulation dose is Coumadin 3 mg tabs: Take as directed by coumadin clinic..  The target INR is 2.0-2.5.  The next INR is due 12/20/2009.  Anticoagulation instructions were given to patient.  Results were reviewed/authorized by Shelby Dubin, PharmD, BCPS, CPP.  He was notified by Shelby Dubin PharmD, BCPS, CPP.         Prior Anticoagulation Instructions: INR 2.1 Continue 1 pill  everyday except 1/ pill on Mondays and Fridays. Recheck in 4 weeks.   Current Anticoagulation Instructions: INR 2.1  Continue 0.5 tab each Monday and Friday and 1 tab on all other days.  Recheck in 4 weeks.

## 2010-12-13 NOTE — Medication Information (Signed)
Summary: rov/sl  Anticoagulant Therapy  Managed by: Cloyde Reams, RN, BSN Referring MD: Charlies Constable MD PCP: [facility] family pratice-dr Elana Alm Supervising MD: Daleen Squibb MD, Maisie Fus Indication 1: Aortic Valve Replacement (ICD-V43.3) Indication 2: Aortic Valve Disorder (ICD-424.1) [facility]  Site: [address] INR POC 2.0 INR RANGE 2 - 2.5  Dietary changes: no    Health status changes: no    Bleeding/hemorrhagic complications: no    Recent/future hospitalizations: no    Any changes in medication regimen? yes       Details: Amoxicillin today prior to DDS visit.   Recent/future dental: yes     Details: Seeing DDS today, tooth ache needs extracted.   Any missed doses?: no       Is patient compliant with meds? yes       Allergies: 1)  ! Sulfa  Anticoagulation Management History:      The patient is taking warfarin and comes in today for a routine follow up visit.  Positive risk factors for bleeding include an age of 75 years or older.  The bleeding index is 'intermediate risk'.  Positive CHADS2 values include Age > 75 years old.  The start date was 02/25/1998.  Anticoagulation responsible Wynnie Pacetti: Daleen Squibb MD, Maisie Fus.  INR POC: 2.0.  Cuvette Lot#: [id number].  Exp: 10/2011.    Anticoagulation Management Assessment/Plan:      The patient's current anticoagulation dose is Coumadin 3 mg tabs: Take as directed by coumadin clinic..  The target INR is 2.0-2.5.  The next INR is due 10/13/2010.  Anticoagulation instructions were given to patient.  Results were reviewed/authorized by Cloyde Reams, RN, BSN.  He was notified by Cloyde Reams RN.         Prior Anticoagulation Instructions: INR 2.3  Continue taking Coumadin 1 tab (3 mg) on all days except Coumadin 0.5 tab (1.5 mg) on Mondays and Fridays. Return to clinic in 4 weeks.   Current Anticoagulation Instructions: INR 2.0  Continue on same dosage 1 tablet daily except 1/2 tablet on Mondays and Fridays.  Recheck in 4 weeks.

## 2010-12-13 NOTE — Medication Information (Signed)
Summary: [name]  Anticoagulant Therapy  Managed by: Rolland Porter, PharmD Referring MD: Charlies Constable MD PCP: [facility] family pratice-dr Tomasita Crumble MD: Tenny Craw MD, Gunnar Fusi Indication 1: Aortic Valve Replacement (ICD-V43.3) Indication 2: Aortic Valve Disorder (ICD-424.1) Lab Used: [facility] Site: [address] INR POC 2.1 INR RANGE 2 - 2.5  Dietary changes: no    Health status changes: no    Bleeding/hemorrhagic complications: no    Recent/future hospitalizations: no    Any changes in medication regimen? no    Recent/future dental: no  Any missed doses?: no       Is patient compliant with meds? yes       Current Medications (verified): 1)  Coumadin 3 Mg Tabs (Warfarin Sodium) .... Take As Directed By Coumadin Clinic. 2)  Flomax 0.4 Mg Caps (Tamsulosin Hcl) .Marland Kitchen.. 1 Tab  Day 3)  Vytorin 10-40 Mg Tabs (Ezetimibe-Simvastatin) .Marland Kitchen.. 1 Tab A Day 4)  Levoxyl 50 Mcg Tabs (Levothyroxine Sodium) .Marland Kitchen.. 1 Tab A Day 5)  Multivitamins   Tabs (Multiple Vitamin)  Allergies (verified): 1)  ! Sulfa  Anticoagulation Management History:      The patient is taking warfarin and comes in today for a routine follow up visit.  Positive risk factors for bleeding include an age of 74 years or older.  The bleeding index is 'intermediate risk'.  Positive CHADS2 values include Age > 43 years old.  The start date was 02/25/1998.  Anticoagulation responsible provider: Tenny Craw MD, Gunnar Fusi.  INR POC: 2.1.  Cuvette Lot#: [id number].  Exp: 09/2011.    Anticoagulation Management Assessment/Plan:      The patient's current anticoagulation dose is Coumadin 3 mg tabs: Take as directed by coumadin clinic..  The target INR is 2.0-2.5.  The next INR is due 08/22/2010.  Anticoagulation instructions were given to patient.  Results were reviewed/authorized by Rolland Porter, PharmD.  He was notified by Rolland Porter.         Prior Anticoagulation Instructions: INR 1.9  Take 1 tablet (3mg ) tonight.  Then resume normal  schedule of taking 1 tablet (3mg ) every day except take 1/2 tablet (1.5mg ) on Mondays and Fridays.  Recheck in 4 weeks.   Current Anticoagulation Instructions: INR 2.1  Continue taking 1 tablet (3 mg) every day except 1/2 tablet (1.5 mg) on Monday's and Friday's.  Recheck in 4 wks

## 2010-12-13 NOTE — Assessment & Plan Note (Signed)
Summary: per check out   Visit Type:  Follow-up Primary Provider:  [facility]-dr mcphail  CC:  occ chest pain overall pt states he is doing good.  History of Present Illness: The patient is 77 years old and returned for management of valvular heart disease. In 1985 he had a St. Jude aortic valve replacement for aortic stenosis. He had nonobstructive CAD at that time. Several years ago he had a subdural hematoma requiring craniotomy. Head, his Coumadin for a period of time. He later developed deep vein thrombophlebitis and pulmonary embolism. He finally survived all that improved. He was able to get back on his Coumadin.  He has been doing reasonably well from the standpoint of his heart. He's had no chest pain shortness of breath or palpitations.  Current Medications (verified): 1)  Coumadin 3 Mg Tabs (Warfarin Sodium) .... Take As Directed By Coumadin Clinic. 2)  Flomax 0.4 Mg Caps (Tamsulosin Hcl) .Marland Kitchen.. 1 Tab  Day 3)  Vytorin 10-40 Mg Tabs (Ezetimibe-Simvastatin) .Marland Kitchen.. 1 Tab A Day 4)  Levoxyl 50 Mcg Tabs (Levothyroxine Sodium) .Marland Kitchen.. 1 Tab A Day 5)  Multivitamins   Tabs (Multiple Vitamin)  Allergies (verified): 1)  ! Sulfa  Past History:  Past Medical History: Reviewed history from 06/08/2009 and no changes required.  1. Status post St. Jude aortic valve replacement in 1985 for aortic       stenosis, now stable.   2. Mild nonobstructive coronary artery disease at catheterization.   3. History of subdural hematoma requiring craniotomy.   4. History of deep vein thrombophlebitis and pulmonary embolus in the       postoperative period.   5. Hyperlipidemia.   Review of Systems       ROS is negative except as outlined in HPI.   Vital Signs:  Patient profile:   77 year old male Height:      71 inches Weight:      157 pounds BMI:     21.98 Pulse rate:   80 / minute BP sitting:   135 / 72  (left arm) Cuff size:   regular  Vitals Entered By: Burnett Kanaris, CNA (June 07, 2010 2:21 PM)   Impression & Recommendations:  Problem # 1:  HEART VALVE REPLACEMENT, HX OF (ICD-V15.1) He is status post prosthetic aortic valve replacement. This problem is stable.  Problem # 2:  HYPERLIPIDEMIA (ICD-272.4) He has hyperlipidemia managed with Vytorin. He recently had a lipid profile with his primary care physician. His updated medication list for this problem includes:    Vytorin 10-40 Mg Tabs (Ezetimibe-simvastatin) .Marland Kitchen... 1 tab a day  Problem # 3:  SUBDURAL HEMATOMA (ICD-432.1) He has a history of subdural hematoma but this has resolved and he is back on Coumadin.  Other Orders: EKG w/ Interpretation (93000)  Patient Instructions: 1)  Your physician recommends that you continue on your current medications as directed. Please refer to the Current Medication list given to you today. 2)  Your physician wants you to follow-up in: 6 months with Dr. Shirlee Latch.  You will receive a reminder letter in the mail two months in advance. If you don't receive a letter, please call our office to schedule the follow-up appointment.

## 2010-12-13 NOTE — Medication Information (Signed)
Summary: rov/ewj  Anticoagulant Therapy  Managed by: Shelby Dubin, PharmD, BCPS, CPP Referring MD: Charlies Constable MD PCP: [facility]-dr Elana Alm Supervising MD: Excell Seltzer MD, Casimiro Needle Indication 1: Aortic Valve Replacement (ICD-V43.3) Indication 2: Aortic Valve Disorder (ICD-424.1) Lab Used: [facility] Site: [facility] INR POC 2.3 INR RANGE 2 - 2.5  Dietary changes: no    Health status changes: no    Bleeding/hemorrhagic complications: no    Recent/future hospitalizations: no    Any changes in medication regimen? no    Recent/future dental: no  Any missed doses?: no       Is patient compliant with meds? yes       Current Medications (verified): 1)  Coumadin 3 Mg Tabs (Warfarin Sodium) .... Take As Directed By Coumadin Clinic. 2)  Flomax 0.4 Mg Caps (Tamsulosin Hcl) .Marland Kitchen.. 1 Tab  Day 3)  Vytorin 10-40 Mg Tabs (Ezetimibe-Simvastatin) .Marland Kitchen.. 1 Tab A Day 4)  Levoxyl 50 Mcg Tabs (Levothyroxine Sodium) .Marland Kitchen.. 1 Tab A Day 5)  Multivitamins   Tabs (Multiple Vitamin)  Allergies (verified): 1)  ! Sulfa  Anticoagulation Management History:      The patient is taking warfarin and comes in today for a routine follow up visit.  Positive risk factors for bleeding include an age of 75 years or older.  The bleeding index is 'intermediate risk'.  Positive CHADS2 values include Age > 25 years old.  The start date was 02/25/1998.  Anticoagulation responsible provider: Excell Seltzer MD, Casimiro Needle.  INR POC: 2.3.  Cuvette Lot#: [id number].  Exp: 03/2011.    Anticoagulation Management Assessment/Plan:      The patient's current anticoagulation dose is Coumadin 3 mg tabs: Take as directed by coumadin clinic..  The target INR is 2.0-2.5.  The next INR is due 02/07/2010.  Anticoagulation instructions were given to patient.  Results were reviewed/authorized by Shelby Dubin, PharmD, BCPS, CPP.  He was notified by Shelby Dubin PharmD, BCPS, CPP.         Prior Anticoagulation Instructions: INR 3.0  Skip  today's dosage of coumadin then resume same dosage 1 tablet daily except 1/2 tablet on Mondays and Fridays.  Recheck in 3 weeks.    Current Anticoagulation Instructions: INR 2.3  Continue 0.5 tab each Monday and Friday and 1 tab on all other days.  Recheck in 4 weeks.  Prescriptions: COUMADIN 3 MG TABS (WARFARIN SODIUM) Take as directed by coumadin clinic.  #60 x 0   Entered and Authorized by:   Shelby Dubin PharmD, BCPS, CPP   Signed by:   Shelby Dubin PharmD, BCPS, CPP on 01/10/2010   Method used:   Samples Given   RxID:   [id number] VYTORIN 10-40 MG TABS (EZETIMIBE-SIMVASTATIN) 1 TAB A DAY  #28 x 0   Entered and Authorized by:   Shelby Dubin PharmD, BCPS, CPP   Signed by:   Shelby Dubin PharmD, BCPS, CPP on 01/10/2010   Method used:   Samples Given   RxID:   [id number]  vytorin 10/40 lot # [id number]   exp 11/2010 coumadin 3 mg lot # [id number] exp 12/2010 mep

## 2010-12-13 NOTE — Progress Notes (Signed)
Summary: discuss surgery   Phone Note Call from Patient Call back at Home Phone [phone]   Caller: Patient Reason for Call: Talk to Nurse Summary of Call: pt needs to discuss surgery with Dr Juanda Chance per dr Elana Alm.  Initial call taken by: Edman Circle,  November 15, 2009 9:06 AM  Follow-up for Phone Call        The pt received a letter from Dr. Elana Alm stating that he had spoken with Dr. Juanda Chance. The pt was instructed to call her regarding the details of that conversation re: his surgical risk with coumadin. I explained to the pt I would discuss with Dr. Juanda Chance and we will call him back later. Sherri Rad, RN, BSN  November 15, 2009 4:12 PM  Dr. Juanda Chance called and spoke with the above pt. The pt will f/u with his urologist Dr. Darvin Neighbours and he will have Dr. Earlene Plater call Dr. Juanda Chance to discuss his situation.  Follow-up by: Sherri Rad, RN, BSN,  November 15, 2009 6:02 PM

## 2010-12-14 DIAGNOSIS — I639 Cerebral infarction, unspecified: Secondary | ICD-10-CM

## 2010-12-14 HISTORY — DX: Cerebral infarction, unspecified: I63.9

## 2010-12-15 NOTE — Medication Information (Signed)
Summary: rov/tm  Anticoagulant Therapy  Managed by: Louann Sjogren, PharmD Referring MD: Charlies Constable MD PCP: [facility] family pratice-dr Tomasita Crumble MD: Eden Emms MD,Hridaan Bouse Indication 1: Aortic Valve Replacement (ICD-V43.3) Indication 2: Aortic Valve Disorder (ICD-424.1) Lab Used: [facility] Site: [address] INR POC 2.0 INR RANGE 2 - 2.5  Dietary changes: no    Health status changes: no    Bleeding/hemorrhagic complications: no    Recent/future hospitalizations: no    Any changes in medication regimen? no    Recent/future dental: no  Any missed doses?: no       Is patient compliant with meds? yes       Allergies: 1)  ! Sulfa  Anticoagulation Management History:      The patient is taking warfarin and comes in today for a routine follow up visit.  Positive risk factors for bleeding include an age of 75 years or older.  The bleeding index is 'intermediate risk'.  Positive CHADS2 values include Age > 75 years old.  The start date was 02/25/1998.  Today's INR is 2.0.  Anticoagulation responsible provider: Eden Emms MD,Elayah Klooster.  INR POC: 2.0.  Cuvette Lot#: [id number].  Exp: 08/2011.    Anticoagulation Management Assessment/Plan:      The patient's current anticoagulation dose is Coumadin 3 mg tabs: Take as directed by coumadin clinic..  The target INR is 2.0-2.5.  The next INR is due 01/02/2011.  Anticoagulation instructions were given to patient.  Results were reviewed/authorized by Louann Sjogren, PharmD.  He was notified by Louann Sjogren PharmD.         Prior Anticoagulation Instructions: INR 1.8 Today take 1.5 pills then resume 1 pill everyday except 1/2 pill on Mondays and Fridays. Recheck in 4 weeks.   Current Anticoagulation Instructions: INR 2.0 (goal 2-2.5)  Continue taking 1 tablet everyday except take 1/2 tablet on Mondays and Fridays. Recheck INR in 4 weeks.

## 2010-12-15 NOTE — Medication Information (Signed)
Summary: rov/sp  Anticoagulant Therapy  Managed by: Cloyde Reams, RN, BSN Referring MD: Charlies Constable MD PCP: [facility]-dr Tomasita Crumble MD: Jens Som MD, Arlys John Indication 1: Aortic Valve Replacement (ICD-V43.3) Indication 2: Aortic Valve Disorder (ICD-424.1) Lab Used: [facility] Site: [address] INR POC 1.8 INR RANGE 2 - 2.5  Dietary changes: no    Health status changes: no    Bleeding/hemorrhagic complications: no    Recent/future hospitalizations: no    Any changes in medication regimen? no    Recent/future dental: no  Any missed doses?: no       Is patient compliant with meds? yes       Allergies: 1)  ! Sulfa  Anticoagulation Management History:      The patient is taking warfarin and comes in today for a routine follow up visit.  Positive risk factors for bleeding include an age of 20 years or older.  The bleeding index is 'intermediate risk'.  Positive CHADS2 values include Age > 58 years old.  The start date was 02/25/1998.  Anticoagulation responsible provider: Jens Som MD, Arlys John.  INR POC: 1.8.  Cuvette Lot#: [id number].  Exp: 08/2011.    Anticoagulation Management Assessment/Plan:      The patient's current anticoagulation dose is Coumadin 3 mg tabs: Take as directed by coumadin clinic..  The target INR is 2.0-2.5.  The next INR is due 12/08/2010.  Anticoagulation instructions were given to patient.  Results were reviewed/authorized by Cloyde Reams, RN, BSN.  He was notified by Cloyde Reams RN.         Prior Anticoagulation Instructions: INR 1.8  Take 1 1/2 tablets today then resume same dose of 1 tablet every day except 1/2 tablet on Monday and Friday.  Recheck INR in 4 weeks.   Current Anticoagulation Instructions: INR 1.8 Today take 1.5 pills then resume 1 pill everyday except 1/2 pill on Mondays and Fridays. Recheck in 4 weeks.

## 2011-01-02 ENCOUNTER — Encounter (INDEPENDENT_AMBULATORY_CARE_PROVIDER_SITE_OTHER): Payer: Medicare PPO

## 2011-01-02 ENCOUNTER — Encounter: Payer: Self-pay | Admitting: Cardiovascular Disease

## 2011-01-02 DIAGNOSIS — Z7901 Long term (current) use of anticoagulants: Secondary | ICD-10-CM

## 2011-01-02 DIAGNOSIS — I359 Nonrheumatic aortic valve disorder, unspecified: Secondary | ICD-10-CM

## 2011-01-02 DIAGNOSIS — Z954 Presence of other heart-valve replacement: Secondary | ICD-10-CM

## 2011-01-10 NOTE — Medication Information (Signed)
Summary: rov/sp  Anticoagulant Therapy  Managed by: Bethena Midget, RN, BSN Referring MD: Charlies Constable MD PCP: [facility]-dr Tomasita Crumble MD: Clifton James MD, Cristal Deer Indication 1: Aortic Valve Replacement (ICD-V43.3) Indication 2: Aortic Valve Disorder (ICD-424.1) Lab Used: [facility] Site: [address] INR POC 2.2 INR RANGE 2 - 2.5  Dietary changes: no    Health status changes: no    Bleeding/hemorrhagic complications: no    Recent/future hospitalizations: no    Any changes in medication regimen? no    Recent/future dental: no  Any missed doses?: no       Is patient compliant with meds? yes       Allergies: 1)  ! Sulfa  Anticoagulation Management History:      The patient is taking warfarin and comes in today for a routine follow up visit.  Positive risk factors for bleeding include an age of 75 years or older.  The bleeding index is 'intermediate risk'.  Positive CHADS2 values include Age > 52 years old.  The start date was 02/25/1998.  His last INR was 2.0.  Anticoagulation responsible provider: Clifton James MD, Cristal Deer.  INR POC: 2.2.  Cuvette Lot#: [id number].  Exp: 12/2011.    Anticoagulation Management Assessment/Plan:      The patient's current anticoagulation dose is Coumadin 3 mg tabs: Take as directed by coumadin clinic..  The target INR is 2.0-2.5.  The next INR is due 01/30/2011.  Anticoagulation instructions were given to patient.  Results were reviewed/authorized by Bethena Midget, RN, BSN.  He was notified by Bethena Midget, RN, BSN.         Prior Anticoagulation Instructions: INR 2.0 (goal 2-2.5)  Continue taking 1 tablet everyday except take 1/2 tablet on Mondays and Fridays. Recheck INR in 4 weeks.  Current Anticoagulation Instructions: INR 2.2 Continue 1 pill everyday except 1/2 pill on Mondays and Fridays. Recheck in 4 weeks.

## 2011-01-11 ENCOUNTER — Encounter: Payer: Self-pay | Admitting: Cardiology

## 2011-01-11 DIAGNOSIS — I359 Nonrheumatic aortic valve disorder, unspecified: Secondary | ICD-10-CM | POA: Insufficient documentation

## 2011-01-11 DIAGNOSIS — Z7901 Long term (current) use of anticoagulants: Secondary | ICD-10-CM | POA: Insufficient documentation

## 2011-01-11 DIAGNOSIS — I82409 Acute embolism and thrombosis of unspecified deep veins of unspecified lower extremity: Secondary | ICD-10-CM

## 2011-01-11 DIAGNOSIS — Z952 Presence of prosthetic heart valve: Secondary | ICD-10-CM

## 2011-03-28 NOTE — Assessment & Plan Note (Signed)
[facility]                            CARDIOLOGY OFFICE NOTE   [name], [name]                   MRN:          [id number]  DATE:05/14/2007                            DOB:          04-27-1928    PRIMARY CARE PHYSICIAN:  Ernestina Penna, M.D. and Dr. Montey Hora in  [facility].   PAST MEDICAL HISTORY:  [name] is 77 years old and returns for  management of his valvular heart disease.  He had a St. Jude aortic  valve replacement in 1985.  A year ago he developed a subdural hematoma  requiring surgery and he subsequently developed pulmonary embolus.  All  this required a down-titration of his anticoagulation therapy.  He  finally got through all this and has done well.  He has had no recent  chest pain, shortness of breath, or palpitations.   He does have nonobstructive coronary artery disease at catheterization.   PAST MEDICAL HISTORY:  Significant for hyperlipidemia.   CURRENT MEDICATIONS:  Flomax.  Coumadin.  Vytorin.  Levoxyl.   EXAMINATION:  Blood pressure is 129/68 and pulse 77 and regular.  There is no venous distension.  Carotid pulses are full without bruits.  Chest was clear.  CARDIAC:  Rhythm was regular.  There was normal prosthetic closure  sounds.  There was short systolic murmur at the left sternal edge.  I  could hear no diastolic murmur.  The abdomen is soft with normal bowel sounds.  There is no  hepatosplenomegaly.  Peripheral pulses are full and there is no peripheral edema.   ELECTROCARDIOGRAM:  Showed sinus rhythm and was normal.   IMPRESSION:  1. Status post St. Jude aortic valve replacement in 1985, stable.  2. Nonobstructive coronary disease.  3. History of subdural hematoma requiring craniotomy.  4. History of deep vein thrombophlebitis and pulmonary embolus in the      postoperative period.  5. Hyperlipidemia.   RECOMMENDATIONS:  I think [name] is doing quite well.  He has  not  had any lipid profile in some time, so we will plan to have him come  back for fasting lipid, liver, CBC, BNP, and TSH.  I will plan to see  him back in followup in a year.   ADDENDUM:  His wife was in the hospital following anterior back surgery  with titanium disk replacement, which was complicated by perforated  small intestine.  She was in the hospital over 100 days and was very  sick, and required emergency laparotomy.  She is now doing better.     Bruce Elvera Lennox Juanda Chance, MD, [facility]  Electronically Signed    BRB/MedQ  DD: 05/14/2007  DT: 05/14/2007  Job #: [id number]

## 2011-03-28 NOTE — Assessment & Plan Note (Signed)
[facility]                            CARDIOLOGY OFFICE NOTE   Stanley, [name]                   MRN:          [id number]  DATE:05/13/2008                            DOB:          14-Jan-1928    PRIMARY CARE PHYSICIANS:  Stanley Penna, MD in [place] and Dr. Montey [name], [facility].   CLINICAL HISTORY:  [name] is 77 years old, who returned for  followup management of his valvular heart disease.  He had a St. Jude  aortic valve replacement in 1985 for aortic stenosis.  In 2007, he  developed a substernal hematoma requiring surgery and during this period  he had to be off anticoagulants.  He subsequently developed deep vein  thrombophlebitis and a pulmonary embolus.  Finally, he is able to get  through all these without developing any problems with his St. Jude  aortic prosthesis.  He is now doing well and says he has had no recent  chest pain, shortness breath, or palpitations.   He has had nonobstructive coronary artery disease at catheterization.   PAST MEDICAL HISTORY:  Significant for hyperlipidemia.   CURRENT MEDICATIONS:  1. Flomax.  2. Coumadin, which is followed here in our clinic.  3. Vytorin 10/40.  4. Levoxyl.   PHYSICAL EXAMINATION:  VITAL SIGNS:  The blood pressure is 120/71 and  the pulse is 76 and regular.  NECK:  There is no venous tension.  The carotid pulses were full without  bruits.  CHEST:  Clear without rales or rhonchi.  CARDIAC:  Rhythm was regular.  There is normal prosthetic closure sound.  I could hear no diastolic murmur.  There is a grade 1-2/6 short systolic  murmur.  ABDOMEN:  Soft without organomegaly.  EXTREMITIES:  Peripheral pulses are full with no peripheral edema.   Electrocardiogram showed left axis deviation and minor nonspecific ST-T  changes.   IMPRESSION:  1. Status post St. Jude aortic valve replacement in 1985 for aortic      stenosis, now stable.  2. Mild  nonobstructive coronary artery disease at catheterization.  3. History of subdural hematoma requiring craniotomy.  4. History of deep vein thrombophlebitis and pulmonary embolus in the      postoperative period.  5. Hyperlipidemia.   RECOMMENDATIONS:  I think, [name] is doing quite well.  We will  plan to get a lipid and liver profile as well as a CBC, BMP, and TSH  when he comes in for his next pro-time.  I will see him back in follow  up in a year.     Bruce Elvera Lennox Juanda Chance, MD, [facility]  Electronically Signed    BRB/MedQ  DD: 05/13/2008  DT: 05/14/2008  Job #: [id number]

## 2011-03-31 NOTE — Consult Note (Signed)
NAME:  [name], [name]                      ACCOUNT NO.:  [id number]   MEDICAL RECORD NO.:  [id number]                   PATIENT TYPE:  INP   LOCATION:  3715                                 FACILITY:  [facility]   PHYSICIAN:  Hewitt Shorts, M.D.            DATE OF BIRTH:  1928-06-19   DATE OF CONSULTATION:  07/31/2003  DATE OF DISCHARGE:                                   CONSULTATION   HISTORY OF PRESENT ILLNESS:  The patient is a 77 year old right-handed white  male who has been under my care since June of this year and has had several  hospitalizations during that time. He was most recently discharged on  July 19, 2003, and was readmitted last evening  by the [facility] Teaching Service.   The patient's history is notable for having developed aortic stenosis and  undergoing an aortic valve replacement in 1985  with placement  of a St.  Jude's valve. He was subsequently anticoagulated with Coumadin because of  the valve's mechanical nature.   In April of this year the patient developed headaches. A CT scan was  obtained by his physicians at [facility] and was  unremarkable. That study was done at [facility].   The patient presented to the [facility] emergency room in June of  this year complaining of headache. A CT scan of the head was done which  revealed thin, bilateral chronic subdural hematomas. I admitted the patient  to the hospital. His anticoagulation was reversed and we followed  his  subdural hematomas which remained stable  in size. He was discharged to  home, but over a 4 week period of time, there was no resolution of the  subdural hematomas. The patient continued to complain of headaches  and  therefore  the patient was admitted and underwent bilateral frontal and  parietal craniectomies for evacuation of the subdural hematoma. The  procedure was tolerated well and  he had good relief of the  headaches.   The patient was subsequently discharged to home to follow up in the office,  but  on June 25, 2003, he developed pleuritic chest pain. He presented to  the emergency room and was evaluated by Dr. Juanito Doom in the [facility]  Cardiology Service. A CT scan of the chest revealed pulmonary embolism, and  after consultation with me, Dr. Daleen Squibb initiated anticoagulation with tightly  controlled heparin and then tightly  controlled Coumadin.   However, subsequent to his discharge back to home, the patient developed  a  headache on July 07, 2003, and contacted our office the following day. A  CT scan revealed  at that time recurrent subdural hematoma and the patient  was readmitted. His  anticoagulation was once again reversed. The [facility]  Cardiology Service placed an inferior vena cava filter and the patient was  subsequently taken for bilateral frontal parietal craniotomies on July 13, 2003, for evacuation of the subdural hematomas. We left subdural drains in  place. These were subsequently removed. A follow up CT scan showed a stable  appearance  and he was discharged to home on July 19, 2003.   The patient over  the past week or 2 had developed pain in his right groin  as well as  some low back pain. He was suspicious for the possibility of  kidney stones and underwent evaluation this week with Dr. Darvin Neighbours.  Yesterday while going for those evaluations with his son, he  had several  episodes of passing out consistent with syncope and presyncope, and after  contacting Dr. Regino Schultze office, he was taken to the [facility]  emergency room, evaluated, and admitted to the hospital, specifically to the  [facility] Service. Notably the patient had no motor seizure  activity with any of these episodes. He denies  any headaches. He was found  on his examination  by the teaching service to have orthostasis.   Notably the patient had been on Cardura for a long  time for prostatic  hypertrophy. That has since been discontinued by the teaching service and  he has been started on Flomax a an alternative. The patient has been seen in  cardiology consultation  by the [facility] Cardiology Service once again and a  neurosurgical consultation was requested. Past medical history, past  surgical history, allergies, medications, family history and social history  are all documented in my history and physical examination from July 08, 2003.   REVIEW OF SYSTEMS:  Notable for that as described above but is otherwise  unremarkable.   PHYSICAL EXAMINATION:  GENERAL:  The patient is a well developed, well  nourished white male in no acute distress.  LUNGS: Clear to auscultation. He has symmetrical respirations.  HEART:  Regular rate and rhythm, normal S1 and S2, no murmur.  ABDOMEN:  Soft, nontender, normal bowel sounds are present.  NEUROLOGIC:  The patient is awake, alert, fully oriented. Speech is fluent.  He has good comprehension. Cranial nerves show the pupils to be equally  round and reactive to light about 3 mm bilaterally. Extraocular movements  intact. Facial movement is symmetrical. Palatal movement is symmetrical.  Shoulder shrug is symmetrical. Tongue is in the midline. Motor examination  shows 5/5 strength  in the upper  and lower extremities. He has no drift of  the upper extremities. Sensation  is intact to pinprick. Reflexes are  symmetrical.   LABORATORY DATA:  Dilantin level  done in the emergency room last night  revealed a level  of 16.9. His outpatient Dilantin dose was 200 mg b.i.d.   IMPRESSION:  1. Syncope/presyncope. This may well be due to orthostasis. I tend to doubt     seizure as a cause of the significant presyncope, particularly  in the     absence of any motor seizure activity.  2. Status post  evacuation of bilateral subdural hematomas. The patient is     neurologically stable. His wounds are well healed. 3. Dilantin for  seizure prophylaxis. Outpatient dosage was 200 mg b.i.d.   RECOMMENDATIONS:  I have recommended and have ordered a CT scan of the brain  without contrast and a Dilantin level to be done on August 02, 2003, to  be available for morning rounds  on August 03, 2003. We  will change the patient's Dilantin dosage to his previously therapeutic dose  of 200 mg b.i.d. I  have had the option to speak to the patient and his wife  and both of his sons as well as one of his daughter-in-laws about  his  condition. I will plan on following  up on the patient on August 03, 2003.                                               Hewitt Shorts, M.D.    RWN/MEDQ  D:  07/31/2003  T:  08/02/2003  Job:  [id number]   cc:   Charlies Constable, M.D.   Ronald L. Ovidio Hanger, M.D.  [address]  [place]  [place] [address]  Fax: [phone]   [facility]

## 2011-03-31 NOTE — H&P (Signed)
NAME:  [name], [name]                      ACCOUNT NO.:  [id number]   MEDICAL RECORD NO.:  [id number]                   PATIENT TYPE:  INP   LOCATION:  3106                                 FACILITY:  [facility]   PHYSICIAN:  Hewitt Shorts, M.D.            DATE OF BIRTH:  1928-10-22   DATE OF ADMISSION:  07/08/2003  DATE OF DISCHARGE:                                HISTORY & PHYSICAL   HISTORY OF PRESENT ILLNESS:  The patient is a 77 year old, right-handed,  white male who is readmitted to [facility] with diagnosis of  increasing bilateral subdural hematomas.  His history is notable for having  had a St. Jude's valve (mechanical) placed 19 years ago for which he was on  Coumadin anticoagulation therapy for 19 years.   In April of this year, he developed headaches.  He saw his primary  physician.  CT scan of the head was done, which was unremarkable.  His  headaches continued and he presented to the [facility] Emergency  Room complaining of headaches in late June of this year and CT at that time  revealed thin bilateral chronic subdural hematomas.  The patient was  admitted; his anticoagulation was reversed with fresh frozen plasma and  vitamin K, and because of the lack of neurologic deficit and the relative  small size of the subdural hematomas, we decided to observe them and to see  if they might resolve on their own.   The patient was discharged to home and followed as an outpatient in the  office.  However, followup CT scan showed no changed in the subdural  hematomas and therefore, patient was readmitted on June 08, 2003, for  evacuation of the hematomas via bilateral frontal and parietal  craniectomies.  The patient did well from that surgery; however, he did  develop reaccumulation of these chronic subdural hematomas.  However, his  headaches had essentially resolved and therefore, we decided to observe  these.  He followed up at the office earlier this  month.  CT scan showed no  change in the subdural hematomas and he was scheduled to be seen in the  [facility] anticoagulation clinic.  However, prior to that visit, he  developed pleuritic chest pain at home.  He came to the emergency room and  was evaluated by Dr. Juanito Doom from the [facility] Cardiology Service and found  to have pulmonary embolism.  He was admitted to Dr. Vern Claude service and after  consultation with me started on heparin, and subsequently Coumadin and then  was discharged to home on Coumadin therapy being followed in the [facility]  Anticoagulation Clinic.   Last night, while watching TV, the patient developed sudden worse headache,  as well as vertical diplopia, and he contacted the office today.  We  requested a CT scan of the brain without contrast and for the patient to  come into the office for evaluation.  At  this time, he is having some  headache, but no diplopia and he does not describe any other neurologic  difficulties.  He has been taking Coumadin on a regular basis per the  instructions of the anticoagulation clinic at [facility].   PAST MEDICAL HISTORY:  Notable for his history of aortic valve disease.  He  presented 19 years ago with shortness of breath with physical activity,  which gradually worsened.  He developed dizziness and presyncope.  He was  found to have aortic valve disease and underwent an aortic valve replacement  by Dr. Juanda Chance in 1995.  There is history of subdural hematoma this year as  described above.  He has had a history of pulmonary embolism this month as  described above.  He has no history of hypertension, myocardial infarction,  cancers, stroke, diabetes, peptic ulcer disease, or lung disease.   PAST SURGICAL HISTORY:  His surgeries include open-heart surgery in 1995,  ________ in 1935, and ______ surgery in July of this year, as  described  above.   ALLERGIES:  He reports allergies to sulfa, which causes hives and  codeine,  which causes nausea.   MEDICATIONS:  1. Cardura 4 mg b.i.d.  2. Lipitor 40 mg q.p.m.  3. Dilantin 300 mg q.a.m.  4. Coumadin.   FAMILY HISTORY:  His mother died of stroke at age 60.  He father died of  heart attack at age 54.   SOCIAL HISTORY:  The patient is retired; he is married.  He does not drink  alcoholic beverages.   REVIEW OF SYSTEMS:  Notable for what is described in his history of present  illness and past medical history, but is otherwise unremarkable.   PHYSICAL EXAMINATION:  GENERAL:  This is a well-developed, well-nourished,  white male in no acute distress.  LUNGS:  Clear to auscultation and symmetrical  HEART:  Regular rate and rhythm with clear valvular sound.  There is no  murmur.  ABDOMEN:  Soft, nontender, bowel sounds present.  EXTREMITIES:  Show no clubbing, cyanosis, or edema.  NEUROLOGIC:  Shows the patient to be awake, alert, fully oriented.  His  speech is fluent.  Normal comprehension.  Cranial nerves show pupils to be  equal and reactive to light at about 3 mm bilaterally.  Extraocular  movements are intact.  His facial movement is symmetrical.  Motor  examination shows 5/5 strength in the upper and lower extremities.  He has  no drift of the upper extremities.  Sensation is intact throughout.  Reflexes are symmetrical.   DIAGNOSTIC IMPRESSION:  A CT scan of the brain done without contrast today  at [facility] as an outpatient shows bilateral frontoparietal acute  and chronic subdural hematoma.  Overall, larger hematoma is now on the right  side with more than acute component seen intermixed with the chronic  component on the right side.  This is noticeably increased as compared to  his most recent CAT scan earlier this month.   IMPRESSION:  1. Bilateral acute and chronic subdural hematomas contributed to by     anticoagulation with Coumadin. 2. History of pulmonary embolism earlier this month.  3. St. Jude's (mechanical)  aortic valve.  4. Anticoagulation with Coumadin for #2 and #3.   PLAN:  The patient will be admitted to the neurosurgical advanced care unit.  Observation including neuro checks, reversal of his anticoagulation with  fresh frozen plasma and vitamin K.  The patient is to be seen in cardiology  consultation by [facility]iology regarding placement of an inferior vena  cava stent (the case was discussed with Dr. Juanda Chance who concurs with these  plans).  The patient will require bilateral frontoparietal craniotomies  possibly craniectomies for evacuation of subdural hematoma.   The patient was seen with his wife and son, and the patient and his family  clearly understand the difficulties in this case and the significant risks  regarding his cranial condition (subdural hematomas), his pulmonary  embolism, and his mechanical heart valve.  Nevertheless, they also fully  understand the need for reverse of his anticoagulation at  this time for placement of his IVC filter and bilateral craniotomies for  evacuation of subdural hematoma.  They understand the risks of infection,  bleeding, neurologic deficit, complications regarding his heart valve and/or  his pulmonary embolism and complications in regards to surgery.                                                  Hewitt Shorts, M.D.    RWN/MEDQ  D:  07/09/2003  T:  07/09/2003  Job:  [id number]   cc:   Charlies Constable, M.D.

## 2011-03-31 NOTE — H&P (Signed)
NAMEAERON, DONAGHEY            ACCOUNT NO.:  [id number]   MEDICAL RECORD NO.:  [id number]          PATIENT TYPE:  INP   LOCATION:                               FACILITY:  [facility]   PHYSICIAN:  Almedia Balls. Ranell Patrick, M.D. DATE OF BIRTH:  17-Mar-1928   DATE OF ADMISSION:  05/15/2005  DATE OF DISCHARGE:                                HISTORY & PHYSICAL   Mr. Lazalde is a 77 year old male who comes in complaining about left  shoulder pain.   HISTORY OF PRESENT ILLNESS:  Patient has left shoulder pain that has been  ongoing.  MRI shows some mild bursitis, but no discrete rotator cuff tear.  Patient has elected to have a left shoulder arthroscopy and possible rotator  cuff repair by Dr. Malon Kindle on May 15, 2005.  Patient has allergies to  codeine and sulfa.   CURRENT MEDICATIONS:  1.  Coumadin 3 mg daily.  2.  Lipitor 40 mg p.o. daily.  3.  Flomax 0.4 mg p.o. daily.  4.  Levoxyl 25 mg p.o. daily.  5.  Multivitamin.   PAST MEDICAL HISTORY:  1.  BPH.  2.  Aortic valve replacement using a St. Jude.  3.  Coronary artery disease that is nonobstructive.  4.  History of DVT and PE.   SOCIAL HISTORY:  Patient is married.  He is retired.  Does not smoke or  drink.  Dr. Juanda Chance is his primary care.   FAMILY HISTORY:  Significant for coronary artery disease.   REVIEW OF SYSTEMS:  Pain with that left shoulder, especially with range of  motion.  Decreased strength.   PHYSICAL EXAMINATION:  GENERAL:  Mr. Cato is a healthy 77 year old  male.  No acute distress.  Pleasant mood and affect.  Alert and oriented x3.  VITAL SIGNS:  Pulse 84, respirations 18, blood pressure 148/72.  HEAD AND NECK:  Cranial nerves II-XII are grossly intact.  No complete  deficits.  NECK:  Full range of motion with no tenderness to palpation in paraspinal  muscles or spinous processes.  CHEST:  Active breath sounds with no wheezes, rhonchi, or rales.  HEART:  Audible click from the St. Jude aortic valve, but  no other  significant sounds.  ABDOMEN:  Nontender, nondistended.  Active bowel sounds in all four  quadrants.  EXTREMITIES:  Full range of motion bilateral upper extremities and lower  extremities.  He does have some mild pain in that left shoulder.  Positive  Hawkins.  Negative cross arm test.  Neurovascular is intact.  SKIN:  Intact.   MRI shows some mild left shoulder bursitis without discrete rotator cuff  tear.   IMPRESSION:  Left shoulder pain due to impingement.   PLAN:  Left shoulder arthroscopy and possible rotator cuff repair by Dr.  Malon Kindle on May 15, 2005.      Shella Spearing   TBD/MEDQ  D:  05/09/2005  T:  05/09/2005  Job:  [id number]

## 2011-03-31 NOTE — Discharge Summary (Signed)
NAME:  [name], [name]                      ACCOUNT NO.:  [id number]   MEDICAL RECORD NO.:  [id number]                   PATIENT TYPE:  INP   LOCATION:  3030                                 FACILITY:  [facility]   PHYSICIAN:  Hewitt Shorts, M.D.            DATE OF BIRTH:  September 15, 1928   DATE OF ADMISSION:  06/08/2003  DATE OF DISCHARGE:  06/15/2003                                 DISCHARGE SUMMARY   HISTORY OF PRESENT ILLNESS:  The patient is a 77 year old man who had  developed headache about four months ago.  Notably, he was on Coumadin for a  mechanical heart valve.  CT scan in April was unremarkable.  He continued to  have headache, and in June, he presented to the emergency room complaining  of headache.  CT scan was done which showed bilateral chronic subdural  hematomas.  The patient was admitted, treated with fresh frozen plasma and  vitamin K to reverse the anticoagulation and was followed for a month with  serial CT scans which showed no resolution of the subdural hematomas, and  the patient symptomatically continued to complain of headache.   Decision was, therefore, made to admit the patient for bilateral  frontoparietal craniectomies and evacuation of subdural hematoma.  Details  of the admission History and Physical examination are included in the  admission note.   HOSPITAL COURSE:  The patient was admitted, underwent bilateral frontal and  parietal craniectomies with evacuation of subdural hematoma.  Postoperatively, he has done well.  His wounds have healed well, and he has  remained neurologically intact.  He was loaded with Dilantin at the time of  surgery and has been continued on 20 mg q.a.m.  Dilantin level last week was  12.5.  Followup CT scan of the head was done today and showed residual  pneumocephalus and subdural fluid.  However, symptomatically the patient  notes that he is not having the headaches as he did before surgery.   His wounds have been  healing well, and we went ahead and asked the nurses to  remove the staples from the incisions.  He has been given instructions  regarding wound care and activities.  Following discharge, he is to return  to my office in one week with a new CT of the brain without contrast, and we  will plan on seeing him then.   DISCHARGE MEDICATIONS:  1. He as given a prescription for Dilantin 100 mg capsules, brand name only,     to take 3 q.a.m. Dispense 100 capsules and 5 refills.  2. He already has Vicodin and Tylenol at home for discomfort and pain.   DISCHARGE DIAGNOSES:  Bilateral chronic hemispheric subdural hematomas.  Hewitt Shorts, M.D.    RWN/MEDQ  D:  06/15/2003  T:  06/16/2003  Job:  [id number]

## 2011-03-31 NOTE — Discharge Summary (Signed)
[name], [name]            ACCOUNT NO.:  [id number]   MEDICAL RECORD NO.:  [id number]          PATIENT TYPE:  INP   LOCATION:  5740                         FACILITY:  [facility]   PHYSICIAN:  Sandria Bales. Ezzard Standing, M.D.  DATE OF BIRTH:  1928-10-17   DATE OF ADMISSION:  10/20/2004  DATE OF DISCHARGE:  10/26/2004                                 DISCHARGE SUMMARY   DISCHARGE DIAGNOSES:  1.  Chronic cholecystitis with cholelithiasis.  2.  St. Jude cardiac valve.  3.  Anticoagulation for St. Jude cardiac valve.  4.  History of craniotomy.  5.  History of deep vein thrombosis with inferior vena caval filter placed      in 2004.  6.  Benign prostatic hypertrophy.  7.  Splenic artery aneurysm.  8.  Left renal cyst.   PROCEDURE:  The patient had a laparoscopic cholecystectomy with  intraoperative cholangiogram on October 25, 2004.  His admission date was  October 20, 2004.  His discharge date was October 26, 2004.   HOSPITAL COURSE:  [name] is a 77 year old white male who again sees  Montey Hora, P.A. at [facility] and Dr. Charlies Constable and presented to the [facility] Emergency Room with  epigastric abdominal pain.  An ultrasound revealed gallstones with a  thickened gallbladder wall.   The patient has significant history that he had a St. Jude valve placed in  1985 by Dr. Delsa Grana. Wilson.  He has been followed chronically by Dr.  Charlies Constable.  He is on Coumadin for this.  He also has a history of DVT and  has a IVC filter placed 2004.  For this reason, since he was anticoagulated  with a PT of 24.3 and an INR of 3.0 on admission, I consulted cardiology and  Dr. Smitty Cords Brodie's service to see him.  His initial white blood count was  8300, hemoglobin 15, hematocrit 44. Again, his ultrasound showed a gallstone  with thickened gallbladder wall.   He was switched over a heparin drip, to come off of the Coumadin.  Labs that  were done  included an INR and a TSH of 8.219.  He had an echocardiogram  which showed a left ventricular ejection fraction of 60%, St. Jude  mechanical valve with mild aortic valvular regurgitation and good wall  function.   The patient by October 24, 2004 was ready for surgery.  His hemoglobin was  13, white blood count 7100, heparin level 0.77.  He was taken to the  operating room on October 25, 2004 where he underwent laparoscopic  cholecystectomy with intraoperative cholangiogram.   The day after surgery he was sore but doing well.  His temperature was 98.4,  pulse of 58.  He was to restart his Coumadin and was ready for discharge.   DISCHARGE MEDICATIONS:  1.  Restarting his home medications which included Lipitor 40 mg q.d.  2.  Flomax 4 mg q.d.  3.  Coumadin 3 mg q.d.  4.  Levoxyl 250 mcg q.d.  5.  Multivitamin and stool softener.   FOLLOW UP:  She was  to see Dr. Charlies Constable the week following to check for  his heparin for his Coumadin level at the [facility]in Clinic.  He was to see me  in two to three weeks for follow-up physical exam.   CONDITION ON DISCHARGE:  Good and I cannot find a pathology report in the  chart at the time of this dictation.      DHN/MEDQ  D:  12/15/2004  T:  12/15/2004  Job:  [id number]   cc:   Montey Hora, P.A.  [facility]   Charlies Constable, M.D. [facility]   Ronald L. Ovidio Hanger, M.D.  [address]  [place]  [place] [address]  Fax: [phone]

## 2011-03-31 NOTE — Consult Note (Signed)
NAME:  Stanley, [name] NO.:  [id number]   MEDICAL RECORD NO.:  [id number]          PATIENT TYPE:  INP   LOCATION:  5740                         FACILITY:  [facility]   PHYSICIAN:  Pricilla Riffle, M.D.    DATE OF BIRTH:  [birth year]   DATE OF CONSULTATION:  10/20/2004  DATE OF DISCHARGE:                                   CONSULTATION   IDENTIFICATION:  [name] is a 77 year old whom we were asked to see in  the perioperative period.  He has a history of mild CAD and is status post  AVR.  He is being evaluated for cholecystectomy.   HISTORY OF PRESENT ILLNESS:  The patient said he woke up at 3 a.m. with  epigastric pain that became worse during the day.  He presented to the ER  for further evaluation.   He denied chest pain, shortness of breath, and is active.   ALLERGIES:  SULFA, CODEINE.   MEDICATIONS:  1.  Coumadin as directed.  2.  Flomax.  3.  Lipitor 40 mg daily.  4.  Levoxyl 25 mg daily.   PAST MEDICAL HISTORY:  1.  Mild CAD by catheterization 2003 (40% proximal and 30% mid-LAD lesion).  2.  History of aortic valve disease, status post AVR in 1985.  3.  History of syncope secondary to orthostatic hypotension September 2004.  4.  History of subdural hematoma July 2004.  5.  History of DVT/pulmonary embolism while off Coumadin.  6.  History of dyslipidemia.  7.  History of BPH.   SOCIAL HISTORY:  The patient lives in [place].  He is married.  Does not  smoke x50 years.  Does not drink x17 years.   FAMILY HISTORY:  Significant for a mother who died at age 64 of a CVA.  Father died of an MI at age 48.   REVIEW OF SYSTEMS:  Notes a 30-pound weight gain x1 year.  Wears glasses.  CARDIAC:  As noted.  GENITOURINARY:  As noted.  MUSCULOSKELETAL:  Arthralgias.  GASTROINTESTINAL:  Some GE reflux.  Otherwise as noted above.  ENDOCRINE:  Negative.   PHYSICAL EXAMINATION:  GENERAL:  On exam, the patient is in no acute  distress.  VITAL SIGNS:  Blood  pressure 127/68, pulse of 65, temperature 97, O2  saturation on room air is 97.  NECK:  JVP is normal.  LUNGS:  Clear.  CARDIAC:  Regular rate and rhythm.  Crisp valve sounds.  No S3 or S4.  ABDOMEN:  Deferred.  EXTREMITIES:  No edema, good pulses.   Chest x-ray not ordered.  Abdominal x-ray shows cholelithiasis, chronic.  Renal artery stenosis.  EKG not ordered.   Labs significant for a hemoglobin of 15.8, WBC of 8.3.  BUN and creatinine  of 21 and 1.2.  INR of 3. Lipase 26.  Albumin 4.4.  Total bilirubin 1.4,  AST, ALT of 53 and 16.  Alkaline phosphatase 92.   IMPRESSION:  The patient is a 77 year old with history of:   1.  Aortic valve replacement.  2.  Mild coronary artery disease by catheterization.  3.  Dyslipidemia.  4.  Note normal ejection fraction.  5.  Note also history of increased coagulability.   Being admitted with cholecystitis, asked to follow in the perioperative  period.  Denies any active cardiac issues.  On exam, bowel sounds good.  I  agree with letting INR trickle down given clotting history.  Would check  echo preoperatively with this history in particular to recheck baseline  gradients.  After surgery, resume Coumadin when okay from surgical  standpoint.  Note some increased risk with hypercoagulability.  Overall,  though, feel the patient is at relatively low risk for major cardiac event.  Will continue to follow.       PVR/MEDQ  D:  10/20/2004  T:  10/21/2004  Job:  [id number]

## 2011-03-31 NOTE — Discharge Summary (Signed)
NAME:  [name], [name]                      ACCOUNT NO.:  [id number]   MEDICAL RECORD NO.:  [id number]                   PATIENT TYPE:  INP   LOCATION:  4706                                 FACILITY:  [facility]   PHYSICIAN:  Leighton Roach McDiarmid, M.D.             DATE OF BIRTH:  08-22-1928   DATE OF ADMISSION:  08/06/2003  DATE OF DISCHARGE:  08/21/2003                                 DISCHARGE SUMMARY   ATTENDING PHYSICIAN:  Leighton Roach McDiarmid, M.D.   PRIMARY CARE PHYSICIAN:  [facility].   REFERRING PHYSICIAN:  [facility].   CONSULTING PHYSICIAN:  Dr. Newell Coral with neurosurgery and Dr. Charlies Constable  with cardiology.   FINAL DIAGNOSES:  1. Bilateral lower extremity deep vein thromboses.  2. Benign prostatic hypertrophy with urinary retention.  3. Status post IVC filter in August 2004.  4. Status post evacuation of subdural hematoma in July 2004 and August 2004.  5. Status post aortic valve replacement in 1995.  6. History of past deep vein thromboses.  7. Status post [place] filter placement.  8. Hyperlipidemia.  9. Depression, not otherwise specified.   PRINCIPAL PROCEDURES:  1. Abdominal CT showing no masses, only showing the DVTs in the iliac veins     bilaterally. 2.  Pelvic CT:  No masses, otherwise within normal limits.  2. Head CT #1:  No new hemorrhage, no mass effect on the frontal lobe, no     hydronephrosis, no ischemia. Head CT #2:  Small bifrontal extra-axial     fluid collections with no mass effect, no midline shift, and no further     hemorrhage.  3. Echocardiogram:  Ejection fraction 55 to 60%, left ventricle motion and     wall thickness within normal limits. Aortic valve prosthesis, mild mitral     regurgitation, and tricuspid regurgitation, no effusions.   ADMISSION HISTORY AND PHYSICAL:  Please see chart.   LABORATORY DATA:  Includes INR at time of discharge of 2.4. Antithrombin-III  122. Protein C total  88, protein C functional 116, protein S total 139,  protein S functional 70. Lupus anticoagulant PTTLA 56.1, lupus anticoagulant  DRVTT 73.8, PTTLA confirmation 27.7, DRVTT confirmation 1.46, lupus  anticoagulant detected but low.   HOSPITAL COURSE:  [name] is a 77 year old Caucasian male who presented  with a one day history of increasing left leg swelling and pain. Lower  extremity Dopplers were obtained at [facility] and showed evidence of  extensive left lower extremity DVT. This patient has a history of recurrent  chronic subdural hematomas and is status post multiple craniectomies, the  most recent being performed on July 13, 2003. He also had an IVC filter  placed by [facility] cardiology in August, and his past medical history is  significant for aortic valve placement in 1985 with a St. Jude's valve, and  he has been anticoagulated on Coumadin in the past; however, this had  been  discontinued due to the recurrent subdural hematoma. The patient was  admitted in fair condition, and both cardiology and neurosurgery evaluated  the patient and discussed the need for anticoagulation. A thorough workup  was performed to evaluate for causes of recurrent DVTs. His coag studies  were all within normal limits except for possible low levels of lupus  anticoagulant. All other clotting functions were within normal limits. The  patient had an age-appropriate malignancy workup performed including an  abdominal and pelvic CT which were negative. The patient is a nonsmoker, and  the patient had a PSA performed at Dr. Jinny Sanders office which was 1.4, and  there were no abnormalities on a chest x-ray performed at admission.  Cardiology and neurosurgery decided that the patient did indeed need  anticoagulation to prevent future clots. He was started on Coumadin and  heparin, and his Coumadin goal continues to be 2.2 plus or minus 0.2. It is  felt that this patient needs very tight control,  and I stress very tight  control, of his INR to prevent further subdural hemorrhages. At the time of  discharge, the patient has been stable for three days with INRs going 2.2,  2.3, 2.4, 2.3, and 2.4, and this was on 2 mg of Coumadin. The patient had  issues with pain in his leg secondary to the DVT during admission. He was  initially started on IV morphine and was gradually tapered to MS Contin p.o.  At the time of discharge, he was on MS Contin 15 mg b.i.d. This greatly  helps his pain, and the patient was able to ambulate much more each day.  During admission, the patient's ability to ambulate increased. The patient's  Dilantin was continued throughout his hospital course, given his history of  subdural hemorrhages.   The patient does have BPH with urinary retention for which he was treated  with Flomax throughout his hospital stay and had no difficulties.   The patient appeared to be struggling throughout his hospital course with  all of the medical complications he has had over the past few months. He was  thought to be suffering from depression and was started on Remeron 15 mg  q.h.s. At the time of discharge, the patient's mood was improved. He is now  smiling and very hopeful about the future but yet still remains a little  nervous about the possibility of future subdural bleeds.   INSTRUCTIONS:  The patient was instructed to return to normal activity and  ambulate as possible. He is instructed to watch his diet and was educated  during his hospital course on the effects of diet on his Coumadin, and he  was instructed to followup with Dr. Charlies Constable on November 10 at 10:45  a.m. and to have a Coumadin check at [facility] on [facility] on Monday,  August 24, 2003 at noon. He is also instructed to followup with [facility]. He is to call them for an appointment and see  them in two weeks.   DISCHARGE MEDICATIONS: 1. Coumadin 2 mg one tablet p.o.  q.d.  2. MS Contin 15 mg one tablet p.o. q.12h.  3. Flomax 0.4 mg one tablet p.o. q.d.  4. Lipitor 40 mg one tablet p.o. q.d.  5.     Dilantin 200 mg one tablet p.o. b.i.d.  6. Remeron 15 mg one tablet p.o. q.h.s.   ADVANCE DIRECTIVES:  None. DNR status full code.      [employer],  MD                          Etta Grandchild, M.D.    SJ/MEDQ  D:  08/21/2003  T:  08/22/2003  Job:  [id number]   cc:   Charlies Constable, M.D.   Hewitt Shorts, M.D.  [address]  [place]  [place] [address]  Fax: [phone]   [facility]

## 2011-03-31 NOTE — Op Note (Signed)
NAME:  [name], SNAPP                      ACCOUNT NO.:  [id number]   MEDICAL RECORD NO.:  [id number]                   PATIENT TYPE:  INP   LOCATION:  3172                                 FACILITY:  [facility]   PHYSICIAN:  Hewitt Shorts, M.D.            DATE OF BIRTH:  1928/04/01   DATE OF PROCEDURE:  06/08/2003  DATE OF DISCHARGE:                                 OPERATIVE REPORT   PREOPERATIVE DIAGNOSIS:  Bilateral hemispheric chronic subdural hematoma.   POSTOPERATIVE DIAGNOSIS:  Bilateral hemispheric chronic subdural hematoma.   OPERATION/PROCEDURE:  Bilateral frontal and parietal craniectomies and  evacuation of subdural hematoma.   SURGEON:  Hewitt Shorts, M.D.   ASSISTANT:  Clydene Fake, M.D.   ANESTHESIA:  General endotracheal.   INDICATIONS FOR PROCEDURE:  This is a 77 year old man who presented with  headaches and was found by CT scan to have bilateral chronic hemispheric  subdural hematomas.  The patient had been anticoagulated with Coumadin for  an aortic valve, (mechanical St. Jude's) five weeks ago.  The  anticoagulation was reversed and the Coumadin and aspirin discontinued  however, follow-up CT scans revealed no resolution of the hematomas.  The  patient continued to complain of headaches and the decision was made to  proceed with evacuation of the hematomas.   It is elected to initially attempt more limited procedure, that of bilateral  frontal and parietal craniectomies, realizing that this may need to be done  in a staged procedure, proceeding on with bilateral craniotomies if  recurrence or re-accumulation of the subdural hematomas occur.   DESCRIPTION OF PROCEDURE:  The patient is brought to the operating room and  placed under general endotracheal anesthesia.  The patient's head was placed  in the donut head rest.  The scalp was shaved, prepped with Betadine soap  and solution and draped in a sterile fashion.  Bilateral frontal and  parietal incisions were made in a parasagittal fashion paralleling the  midline and approximately 4 cm lateral to the midline on either side.  All  of the incision sites were infiltrated with local anesthetic with  epinephrine.  The self-retaining retractors were placed and then four 2.5 cm  in diameter craniectomies were performed using the [facility] drill and  curets.  The dura was then opened at each site in a cruciate fashion with  cautery and xanthochromic fluid under moderate pressure drained.  The  subdural space was irrigated bilaterally with warm saline until clear.  The  brain was pulsating well and it was felt that we did not need to proceed  onto a craniotomy at this time.  We then closed the parietal incisions first  with interrupted inverted 2-0 Vicryl suture, closing the galea and surgical  staples closing the skin.  We then proceeded to close the frontal incisions  in a similar fashion.  The wounds were dressed with Adaptic and sterile  gauze and Hypo  fix.  The procedure was tolerated well.  Sponge and needle counts correct.  Estimated blood loss was 25 cc.  Following surgery, the patient was to be  reversed from anesthetic, extubated and transferred to the recovery room for  further care to be subsequently transferred to the neurosurgical intensive  care unit for further care.                                                Hewitt Shorts, M.D.    RWN/MEDQ  D:  06/08/2003  T:  06/08/2003  Job:  [id number]

## 2011-03-31 NOTE — Op Note (Signed)
NAME:  [name], [name]                      ACCOUNT NO.:  [id number]   MEDICAL RECORD NO.:  [id number]                   PATIENT TYPE:  INP   LOCATION:  3109                                 FACILITY:  [facility]   PHYSICIAN:  Hewitt Shorts, M.D.            DATE OF BIRTH:  [birth year]   DATE OF PROCEDURE:  07/13/2003  DATE OF DISCHARGE:                                 OPERATIVE REPORT   PREOPERATIVE DIAGNOSIS:  Bilateral acute and chronic subdural hematoma.   POSTOPERATIVE DIAGNOSIS:  Bilateral acute and chronic subdural hematoma.   OPERATION/PROCEDURE:  Bilateral frontal parietal craniotomies and evacuation  of subdural hematoma.   SURGEON:  Hewitt Shorts, M.D.   ASSISTANT:  Stefani Dama, M.D.   ANESTHESIA:  General endotracheal.   INDICATIONS:  The patient is a 77 year old man who has had a long and  complicated history.  He has a St. Jude's valve in place for 19 years, has  been on Coumadin, developed headaches and was found to have bilateral  frontal cubital hematomas.  Evacuation was attempted through bilateral  craniectomies and the patient had good relief of his headaches although he  did develop mild re-accumulation of the subdural collections.  He  subsequently developed pulmonary embolism and was restarted on  anticoagulation and ended up further hemorrhage into the subdural  collections resulting in a mixed acute and chronic hematoma bilaterally.  The patient was readmitted and the anticoagulation reversed.  An inferior  vena cava filter was placed and the patient is now brought to surgery for  evacuation of the hematomas.   DESCRIPTION OF PROCEDURE:  The patient was brought to the operating room and  placed under general endotracheal anesthesia.  The scalp was shaved, prepped  with Betadine soap solution and draped in a sterile fashion.  Bilateral  frontal parietal parasagittal incisions were made, extending the incisions  through the previous  craniectomy incisions.  Dissection was carried down to  the scalp. The periosteum was split and the scalp retracted to either side.  The scar tissue within the previous craniectomy was dissected out and  subdural hematoma drained out.  We then dissected the dura from the  overlying skull and using the [facility] drill with the craniotome  attachment, bilateral frontal parietal bone flaps were elevated and the dura  was opened.  There was moderately thick subdural membranes, thicker on the  right than the left.  We were able to remove the parietal membrane  bilaterally and the visceral membrane on the right side by gently elevating  it off of the brain surface.  The visceral membrane on the left side was  thinner and could not be freed from the peel surface.  A good evacuation of  the hematoma was achieved and we placed two flat Jackson-Pratt drains on the  right side where the hematoma cavity was larger and a single Jackson-Pratt  flat drain on the left side.  All of these were brought out through separate  stab incisions.  The dura was approximated but not tightly closed  bilaterally and the bone flap was secured in place with four straight  Osteomed plates on each side and then the scalp was closed on each side with  interrupted inverted 2-0 Vicryl sutures closing the galea and surgical  staples closing the skin.  However, after closure we noted significant  venous bloody output through one of the Jackson-Pratt drains on the right  side and we reopened that incision, removing the staples and cutting the  Vicryl sutures.  The bone flap was re-elevated.  The blood clot appeared to  be extradural although some had extended into the subdural space.  It was  gently irrigated out and it was found that most of the blood was actually  coming from the galea rather than from the subdural space or the dura.  Bleeding points from the galea were coagulated with bipolar cautery.  Good  hemostasis was  established.  The dura was then reapproximated once again.  The bone flap was once again resecured with the same Osteomed plates and  screws.  The galea was closed with interrupted inverted 2-0 running Vicryl  sutures and then the skin edges and scalp were closed with running locking 3-  0 nylon suture that was used to close both layers.  Although there was mild  output into the Jackson-Pratt drains, there was no significant continuous  venous output.  We then dressed the wounds with Adaptic, sterile gauze  sponges and wrapped with two Kerlix and two cling.  Following surgery, the  patient was reversed from anesthetic, extubated and was transferred to the  recovery room for further care where he was noted to be following commands  and moving all four extremities.  The estimated blood loss was 200 cc.  Sponge and needle counts correct.                                                Hewitt Shorts, M.D.    RWN/MEDQ  D:  07/14/2003  T:  07/14/2003  Job:  [id number]

## 2011-03-31 NOTE — Consult Note (Signed)
NAME:  [name], [name]                      ACCOUNT NO.:  [id number]   MEDICAL RECORD NO.:  [id number]                   PATIENT TYPE:  INP   LOCATION:  4706                                 FACILITY:  [facility]   PHYSICIAN:  Hewitt Shorts, M.D.            DATE OF BIRTH:  April 15, 1928   DATE OF CONSULTATION:  08/11/2003  DATE OF DISCHARGE:                                   CONSULTATION   HISTORY OF PRESENT ILLNESS:  The patient is a 77 year old right-handed white  male who has been under my care since June 2004.  Details of his history are  included in my consultation from July 31, 2003.   The patient was admitted five days ago with newly found deep vein thrombosis  which between Doppler examination and CT scan of the pelvis has been found  to involve the iliac veins bilaterally as well as the left femoral veins.   Consultation has been requested regarding the question of anticoagulation  therapy.   His history is notable for aortic valve replacement with a St. Jude valve in  1995, the development of chronic subdural hematoma this past spring which  underwent evacuations in July 2004 and again in August 2004, because of  reaccumulation.  He notably developed a deep vein thrombosis in August 2004,  initially was treated with anticoagulation, but when the subdural hematomas  recurred, an inferior vena cava filter was placed, the patient was taken  back to surgery for re-evacuation of the hematoma.   Clinically, the patient denies any headache, diplopia, blurred vision,  weakness, or other neurologic complaints at this time.   Past medical history otherwise, family history, social history are reviewed  in my previous dictations regarding this patient.   REVIEW OF SYSTEMS:  Notable primarily for discomfort in the pelvis and  groins.  He has discomfort when bearing weight with his left lower  extremity.   PHYSICAL EXAMINATION:  GENERAL:  The patient is a well-developed,  well-  nourished white male in no acute distress.  VITAL SIGNS:  His temperature is 96.8, pulse 85, blood pressure 104/55,  respiratory rate is 18.  LUNGS:  Clear to auscultation.  He has symmetrical respiratory effort.  HEART:  Regular rate and rhythm. S1 and S2.  There is no murmur.  A good  valve sound is heard.  ABDOMEN:  Soft, nondistended, bowel sounds are present.  NEUROLOGIC:  Mental status:  The patient is awake, alert, he is oriented to  his name, [facility], and 2004.  He follows commands.  His speech  is fluent.  He has good comprehension.  Cranial nerves show pupils are  equal, round, and reactive to light.  Extraocular movements are intact.  His  face has symmetrical movement.  Hearing is present.  Tongue is in the  midline.  Motor examination shows 5/5 strength in the upper and lower  extremities.  He has no drift to the  upper extremities.  Sensation is intact  to pinprick.  Reflexes are symmetrical.   IMPRESSION:  1. Pelvic and left lower extremity deep vein thrombosis.  2. Status post inferior vena cava filter placement in August 2004.  3. Status post evacuation of subdural hematoma in July 2004 and August 2004.  4. Status post aortic valve replacement with St. Jude valve in 1995.   RECOMMENDATIONS:  I spoke with the patient and his wife regarding my  assessment and recommendations.  I feel that before making a recommendation  regarding anticoagulation we will need to obtain an updated CT scan of the  brain without contrast, the last one was done about 1-1/2 weeks ago.  Instead it will be done without contrast.  The patient has been continued on  Dilantin 200 mg b.i.d. for seizure prophylaxis, and we will need to check a  Dilantin level which has been ordered as well.                                               Hewitt Shorts, M.D.    RWN/MEDQ  D:  08/11/2003  T:  08/11/2003  Job:  [id number]   cc:   Charlies Constable, M.D.   [facility]

## 2011-03-31 NOTE — Op Note (Signed)
[name], [name]            ACCOUNT NO.:  [id number]   MEDICAL RECORD NO.:  [id number]          PATIENT TYPE:  INP   LOCATION:  5740                         FACILITY:  [facility]   PHYSICIAN:  Sandria Bales. Ezzard Standing, M.D.  DATE OF BIRTH:  January 21, 1928   DATE OF PROCEDURE:  10/25/2004  DATE OF DISCHARGE:                                 OPERATIVE REPORT   PREOPERATIVE DIAGNOSIS:  Chronic cholecystitis and cholelithiasis.   POSTOPERATIVE DIAGNOSIS:  Chronic cholecystitis and cholelithiasis.   OPERATION PERFORMED:  Laparoscopic cholecystectomy with intraoperative  cholangiogram.   SURGEON:  Sandria Bales. Ezzard Standing, M.D.   ASSISTANT:  Gabrielle Dare. Janee Morn, M.D.   ANESTHESIA:  General endotracheal.   ESTIMATED BLOOD LOSS:  Minimal.   INDICATIONS FOR PROCEDURE:  [name] is a 77 year old white male who  has a St. Jude's aortic valve and is on chronic Coumadin.  He presented last  week with symptomatic cholelithiasis.  He has been kept in the hospital on  intravenous heparin until his INR has returned to normal.  He had become  asymptomatic in the hospital.  He is now ready to proceed with surgery for  his gallbladder.  The indications and potential complications were explained  to the patient.  The potential complications include but not limited to  bleeding, infection, bile duct injury, possibility of open surgery and of  course, postoperatively that of bleeding.   DESCRIPTION OF PROCEDURE:  The patient was placed in supine position.  He  underwent a general endotracheal anesthetic, had his abdomen shaved, prepped  with Betadine solution and sterilely draped.  I made an infraumbilical  incision with sharp dissection carried down to the abdominal cavity with 0  degree 10 mm laparoscope.  This was inserted through a 12 mm Hasson trocar  and this was secured with a 0 Vicryl suture.     Abdominal exploration revealed right and left lobes of the liver were  unremarkable.  Stomach was dilated,  otherwise unremarkable.  The bowel  otherwise what I could see was unremarkable.  Three additional trocars were  placed, a 10 mm subxiphoid trocar.  A 5 mm right subcostal.  A 5 mm lateral  subcostal trocar. Again, the gallbladder had evidence of chronic  cholecystitis with whitish change and plaque-y change of the gallbladder  wall.  There were adhesions up about one third of the gallbladder wall.  The  gallbladder was grasped, rotated cephalad.  The cystic artery was identified  anteriorly, doubly clipped and divided.  A clip was placed across the cystic  duct.   Intraoperative cholangiogram was obtained.  The intraoperative cholangiogram  was created using a cut off taut catheter inserted through a 14 gauge Jelco  into the side of the cut cystic duct.  This was then secured with an  EndoClip.  I used about 6 mL of half strength Hypaque solution.  Upon  opening the cystic duct, I did get kind of a thick oily material that came  out, maybe a little debris. The cholangiogram showed free flow of contrast  down into the duodenum, down the common bile duct, up the hepatic  radicals.  There was kind of a strandy look to the common bile duct which I think may  have been some residual thick or inspissated bile in this but otherwise the  cholangiogram was unremarkable.  I felt no further intervention needed to be  done at this time.  The taut catheter was then removed and the cystic duct  was triply endoclipped and divided.   The gallbladder was then sharply and bluntly dissected from the gallbladder  bed.  Prior to division of the gallbladder from the gallbladder bed, I  revisualized the gallbladder and I did see a couple of bleeding spots which  I controlled with Bovie electrocautery.  I then placed a piece of Surgicel.  I inserted a piece of Surgicel in the gallbladder bed.  The gallbladder was  then placed in an endocatch back and delivered through the umbilicus.  I  irrigated the abdomen  with about 1.5 L of saline.  Each trocar was removed  and in turn there was no bleeding in any trocar site.   The umbilical would was closed with a 0 Vicryl suture and the skin at each  site was closed with a 5-0 Vicryl suture, painted with tincture of benzoin  and steri-stripped.  The patient tolerated the procedure well.   Sponge, needle and instrument counts were correct at end of the case.  He  was transferred to recovery room.  Plan was to discharge him tomorrow,  restart his Coumadin at this time with follow-up with Dr. Juanda Chance for his  Coumadin needs following surgery.      [name]   DHN/MEDQ  D:  10/25/2004  T:  10/25/2004  Job:  [id number]   cc:   Charlies Constable, M.D. [facility]   Ronald L. Ovidio Hanger, M.D.  [address]  [place]  [place] [address]  Fax: [phone]

## 2011-03-31 NOTE — Discharge Summary (Signed)
NAME:  [name], [name]                      ACCOUNT NO.:  [id number]   MEDICAL RECORD NO.:  [id number]                   PATIENT TYPE:  INP   LOCATION:  3102                                 FACILITY:  [facility]   PHYSICIAN:  Hewitt Shorts, M.D.            DATE OF BIRTH:  1928/05/10   DATE OF ADMISSION:  07/08/2003  DATE OF DISCHARGE:  07/19/2003                                 DISCHARGE SUMMARY   ADMISSION HISTORY AND PHYSICAL EXAMINATION:  The patient is a 77 year old  man who was admitted for recurrent subdural hematoma.  He has had a history  of difficulty dating back to April of this year, at which time he presented  with headache.  CT scan at that time was unremarkable.  His headaches  persisted.  He presented for reevaluation to the emergency room in June  complaining of headaches.  He was found by CT scan to have bilateral chronic  subdural hematomas.  His past history is notable for having had a St. Jude's  aortic valve placed in 1985 for aortic stenosis for which he was  anticoagulated with Coumadin.   He was admitted at that time.  The anticoagulation was reversed with fresh  frozen plasma and vitamin K.  The subdural hematomas remained stable.  He  was subsequently discharged to home to observe to see if the subdural  hematomas would resolve on their own.  After four weeks, serial CT scans  revealed no resolution of the subdural hematomas and he was admitted for  craniectomies for evacuation of the subdural hematomas in late July of this  year.  He was subsequently discharged to home and was doing well with good  relief of his headaches.  However, he presented subsequently with pleuritic  chest pain.  He underwent a workup by Jesse Sans. Wall, M.D., who was covering  for his usual cardiologist, Charlies Constable, M.D.  Dr. Daleen Squibb found that he had  evidence of pulmonary embolism.  The patient was started on heparin and  converted over to Coumadin, but on a low dose of tightly  controlled level of  anticoagulation.  However, the patient subsequently developed acute  headache.  Followup CT scan revealed recurrent subdural hematoma with both  acute and chronic components.  The patient was evaluated in the office and  admitted from the office for evaluation and treatment of this.   On examination at the time of admission, the general examination was  unremarkable.  The neurologic examination showed that he was intact.   HOSPITAL COURSE:  The patient was admitted and treated with fresh frozen  plasma and vitamin K.  He was seen in cardiology consultation by the [facility] and arrangements were made for placement of inferior vena  cava filter.  This was done to reduce the risks of major pulmonary embolism.  The patient was subsequently taken to surgery on July 13, 2003, for  bilateral frontal parietal  craniotomy and evacuation of subdural hematoma.  Two drains were left in the right side and a single drain was left in the  left side.  He had mild drainage into the Jackson-Pratt drains.  There was a  mixture of CSF and blood.  The drains were removed three days following  surgery.  Postoperative CT scans showed good evacuation of the subdural  hematoma with a small amount of subdural fluid and air.  The overall  appearance was improved as compared to prior to surgery.  We allowed the  patient to be up and ambulating.  He was followed over the last couple of  days by Dr. Phoebe Perch from my service over the Labor Day holiday weekend.  The  patient was doing well according to Dr. Doreen Beam notes and was up and  ambulating.  He felt that the CT scan looked good.  Dr. Phoebe Perch discharged  the patient to home with arrangements for followup later that week for  suture and staple removal.    DISCHARGE DIAGNOSES:  1. Recurrent subdural hematoma, mixed acute and chronic.  2. History of pulmonary embolism.  3. St. Jude's aortic valve.                                                 Hewitt Shorts, M.D.    RWN/MEDQ  D:  07/29/2003  T:  07/29/2003  Job:  [id number]   cc:   Charlies Constable, M.D.

## 2011-03-31 NOTE — Consult Note (Signed)
NAME:  [name], [name]                      ACCOUNT NO.:  [id number]   MEDICAL RECORD NO.:  [id number]                   PATIENT TYPE:  INP   LOCATION:  4706                                 FACILITY:  [facility]   PHYSICIAN:  Rollene Rotunda, M.D.                DATE OF BIRTH:  07-10-1928   DATE OF CONSULTATION:  08/07/2003  DATE OF DISCHARGE:                                   CONSULTATION   REASON FOR CONSULTATION:  Evaluate patient with new onset lower extremity  deep venous thrombosis and aortic valve replacement.   HISTORY OF PRESENT ILLNESS:  The patient is a pleasant 77 year old with a  history of acute and chronic bilateral subdural hematomas.  He has had  bilateral craniotomies, most recently in August.  He has had a history of  DVTs and pulmonary emboli and has a Greenfield filter placed.  He also has  aortic valve replacement with a St. Jude's valve in 1985.  However, with all  of the bleeding difficulties, he has been off Coumadin.  He was recently  hospitalized for a syncopal episode which was felt to be orthostatic with  the exact etiology unclear.  He has had limited functional capacity being  inhibited by low back pain, as well as pain across his lower abdomen and  suprapubic area.  The etiology of this is not entirely clear.   He says that he noticed the onset of acute left lower extremity swelling  with pain yesterday.  He denies any new cardiac symptoms.  His orthostatic  symptoms are mild to moderate, but he has had no presyncope or syncope since  his most recent discharge.  He has had no chest discomfort.  He denies any  shortness of breath.  He does not notice any palpitations.   PAST MEDICAL HISTORY:  1. Greenfield filter on July 10, 2003.  2. Pulmonary emboli/DVT on June 25, 2003.  3. Acute and chronic bilateral subdural hematomas, status post craniotomy on     July 13, 2003.  4. Nonobstructive coronary disease.  5. Hyperlipidemia.  6. Benign  prostatic hypertrophy.  7. Status post St. Jude's aortic valve replacement in 1985.   ALLERGIES:  1. SULFA.  2. CODEINE.   MEDICATIONS:  1. Lipitor 40 mg daily.  2. Dilantin 200 mg b.i.d.  3. Flomax.  4. Multivitamins.  5. Vicodin.   SOCIAL HISTORY:  The patient is married.  He has never smoked cigarettes.  Does not drink alcohol.   FAMILY HISTORY:  Contributory for later onset coronary artery disease in  first degree relatives.   REVIEW OF SYSTEMS:  As stated in the HPI and otherwise negative for other  systems.   PHYSICAL EXAMINATION:  GENERAL APPEARANCE:  The patient is in no distress.  VITAL SIGNS:  Blood pressure 101/63, heart rate 80s and regular, and  afebrile.  HEENT:  Eyes:  Unremarkable.  Pupils equal, round, and reactive  to light.  Fundi no visualized.  Oral  mucosa unremarkable.  NECK:  No jugular venous distention.  Wave form within normal limits.  Carotid upstroke brisk and symmetric.  No bruits.  No thyromegaly.  LYMPHATICS:  No cervical, axillary, or inguinal adenopathy.  LUNGS:  Clear to auscultation bilaterally.  BACK:  No costovertebral angle tenderness.  CHEST:  Well-healed sternotomy scar.  HEART:  PMI not displaced or sustained.  S1 within normal limits.  S2 crisp.  A 2/6 apical systolic murmur radiating out the aortic outflow tract.  No  diastolic murmurs.  ABDOMEN:  Flat.  Positive bowel sounds normal in frequency and pitch.  No  bruits, rebound, guarding, or midline pulsatile mass.  No hepatomegaly or  splenomegaly.  SKIN:  No rash.  No nodules.  EXTREMITIES:  2+ upper pulses, 2+ femorals, 1+ dorsalis pedis, and 2+  posterior tibialis bilaterally.  Diffuse left lower extremity swelling and  calf tenderness.  NEUROLOGIC:  Oriented to person, place, and time.  Cranial nerves II-XII  grossly intact.  Motor grossly intact.   LABORATORY DATA:  WBC 6.0, hemoglobin 9.5.  Sodium 140, potassium 4.2, BUN  11, creatinine 0.9.   ASSESSMENT AND PLAN:  1.  New onset left lower extremity deep venous thrombosis.  The patient has     multiple indications for Coumadin with his mechanical aortic valve.  2. History of pulmonary emboli and deep venous thrombosis.  New deep venous     thrombosis.  He does have a Greenfield filter, but still is at risk for     pulmonary emboli.  However, he has a contraindication of recurrent     subdural hematomas.  Will need to confer with Dr. Newell Coral to weigh the     risks and benefits of restarting Coumadin.  3. Aortic valve replacement as above.  4. Orthostatic hypotension.  This seems to be somewhat better than it had     been a few weeks ago.  He has had no frank syncope.  He will continue on     the medications as listed.  He will continue with conservative measures,     though he cannot use compressive stockings at this point.                                               Rollene Rotunda, M.D.    JH/MEDQ  D:  08/07/2003  T:  08/08/2003  Job:  [id number]

## 2011-03-31 NOTE — H&P (Signed)
NAME:  [name], [name]                      ACCOUNT NO.:  [id number]   MEDICAL RECORD NO.:  [id number]                   PATIENT TYPE:  INP   LOCATION:  3172                                 FACILITY:  [facility]   PHYSICIAN:  Hewitt Shorts, M.D.            DATE OF BIRTH:  Jan 31, 1928   DATE OF ADMISSION:  06/08/2003  DATE OF DISCHARGE:                                HISTORY & PHYSICAL   HISTORY OF PRESENT ILLNESS:  The patient is a 77 year old right-handed white  male who has had difficulties with headaches for three months.  They have  been persistent and had been gradually worsening.  He had undergone a CT  scan of the head in April of this year which was unremarkable.  He presented  to the emergency room five weeks ago and CT scan at that time revealed  bilateral chronic hemispheric subdural hematomas.  The patient notably was  on Coumadin because of an aortic valve and had been therapeutically  anticoagulated.   We admitted the patient five weeks ago.  He was given vitamin K and fresh  frozen plasma.  The anticoagulation was reversed and follow-up CT scans were  stable.  The patient was followed as an outpatient to see if the subdural  hematomas would resolve on their own.  Unfortunately, follow-up CT scans  reveal no change in the bilateral hemispheric chronic subdural hematomas.  The patient has continued to have headache and has remained off Coumadin for  the past month.   That being the case, decision made to admit the patient at this time for  evacuation of the subdural hematomas.  It was planned to attempt bilateral  frontal and parietal craniectomies rather than proceeding with a more  extensive bilateral craniotomies.  However, the latter may be necessary in a  staged fashion.   The patient has also continued to follow up with his cardiologist, Charlies Constable, M.D.   PAST MEDICAL HISTORY:  Unchanged from his May 03, 2003 admission history  and physical  examination.   PAST SURGICAL HISTORY:  Unchanged from his May 03, 2003 admission history  and physical examination.   ALLERGIES:  Unchanged from his May 03, 2003 admission history and physical  examination.   MEDICATIONS:  1. Lipitor 40 mg q.p.m.  2. Cardura 4 mg b.i.d.  3. He is no longer taking Coumadin or aspirin.   FAMILY HISTORY:  Unchanged.   SOCIAL HISTORY:  Unchanged.   REVIEW OF SYSTEMS:  Notable for what was described above, but is otherwise  unremarkable.   PHYSICAL EXAMINATION:  GENERAL:  The patient is a well-developed, well-  nourished white male in no acute distress.  VITAL SIGNS:  Temperature 97.3, pulse 80, blood pressure 109/64, respiratory  rate 18, height 5 feet 11 inches, weight 154 pounds.  LUNGS:  Clear to auscultation.  He has symmetrical respiratory excursion.  HEART:  Regular rate and rhythm.  S1, S2.  There is no murmur.  ABDOMEN:  Soft, nondistended.  Bowel sounds are present.  EXTREMITIES:  No clubbing, cyanosis, edema.  NEUROLOGIC:  Mental status:  The patient awake, alert, fully oriented.  Speech is fluent.  Has good comprehension.  Cranial nerves:  Pupils are  equal and reactive to light and about 2 mm bilaterally.  Extraocular  movements are intact.  Facial movement is symmetrical.  Hearing is intact.  Palate movement is symmetrical.  Shoulder shrug is symmetrical and tongue is  in the midline.  Motor examination shows 5/5 strength in the upper and lower  extremities.  He has no drift to the upper extremities.  Sensation is intact  to pin prick in the upper and lower extremities.  Reflexes are 1-2 in the  biceps, 2-3 in the quadriceps, 1 in the gastrocnemii and they are all  symmetrical.  Toes are downgoing bilaterally.  He has normal gait and  stance.   DIAGNOSTIC STUDIES:  CT scan of the brain without contrast shows bilateral  hemispheric chronic subdural hematomas without change as compared to CT  scans over the preceding five weeks.    IMPRESSION:  1. Bilateral chronic subdural hematomas.  2. St. Jude (mechanical) aortic valve.   PLAN:  The patient will be admitted for bilateral frontoparietal  craniectomies for evacuation of the subdural hematomas.  I have spoken with  the patient and his wife at length on several occasions regarding this  condition.  We discussed the nature of surgery as well as its risks  including risks of infection, bleeding, possible need for transfusion,  possible reaccumulation of hematomas requiring further surgical  intervention, and anesthetic risks.  He also understands the underlying  risks related to his valve and being off of anticoagulation and the  contraindication to anticoagulation at this time due to these hematomas.   He also understands that this may need to be done as a staged procedure if  reaccumulation of the hematomas is encountered.  Understanding all this he  does wish to go ahead with surgery, is admitted for such.                                                Hewitt Shorts, M.D.    RWN/MEDQ  D:  06/08/2003  T:  06/08/2003  Job:  [id number]   cc:   Charlies Constable, M.D.

## 2011-03-31 NOTE — H&P (Signed)
NAME:  [name]                      ACCOUNT NO.:  [id number]   MEDICAL RECORD NO.:  [id number]                   PATIENT TYPE:  INP   LOCATION:  3304                                 FACILITY:  [facility]   PHYSICIAN:  Jesse Sans. Wall, M.D.                DATE OF BIRTH:  March 09, 1928   DATE OF ADMISSION:  06/25/2003  DATE OF DISCHARGE:                                HISTORY & PHYSICAL   We were asked by the emergency room to evaluate [name] with  progressive left lower posterior thoracic pain.   HISTORY OF PRESENT ILLNESS:  He is a 77 year old gentleman who has a history  of coronary artery disease with his last cath in 2001.  He has 30% LAD, RCA  and circumflex without significant disease.  He also has had a St. Jude  mechanical aortic valve since 1985.   He has been off his Coumadin since May 03, 2003 secondary to chronic  bilateral subdural hematomas.  He had surgery on June 12, 2003 with Dr.  Newell Coral.  He is yet to start back on his Coumadin.   Yesterday he felt tired and nauseated and then developed left lower back  pain in his chest.  It got worse over night to the point that it hurt to  take a deep breath, hurt to lie down, and he is very uncomfortable in the  emergency room.   Chest CT shows bilateral pulmonary emboli that are small but there is a  small left pleural effusion.  He has a rub on exam.   PAST MEDICAL HISTORY:   ALLERGIES:  He is intolerant to SULFA, and says CODEINE gives him nausea,  but he does take Vicodin according to his son.   CURRENT MEDICATIONS:  1. Dilantin 300 mg p.o. q.a.m.  2. Lipitor 40 mg daily.  3. Cardura 4 mg b.i.d.   He also has a history of benign prostatic hypertrophy, hyperlipidemia, and a  family history of coronary disease.   SOCIAL HISTORY:  He does not smoke.  Still with his wife.  He is retired  from a Insurance claims handler.   FAMILY HISTORY:  Is really noncontributory.   REVIEW OF SYSTEMS:  Earlier history of  lactose intolerance is negative.   PHYSICAL EXAMINATION:  GENERAL: He is in a fair amount of pain in the  emergency room.  VITAL SIGNS: His blood pressure is 143/65, his pulse is 86 and regular, his  respiratory rate is 21.  He is splinting on the left side.  His temperature  is 96.0.  He is slightly pale, skin is warm and dry. His O2 saturations are  98% on room air.  HEENT: Other than incision in the frontal area bilaterally from his  craniotomy procedure is not remarkable.  NECK: Shows no JVD.  Carotid upstrokes are equal bilaterally without bruits.  There is no thyromegaly.  Trachea is midline.  LUNGS: Reveal  decreased breath sounds in the left base.  He is splinting.  There is a soft rub in the left lateral lower lobe area.  HEART: Reveals a regular rate and rhythm with a positive valve click.  He  has a soft systolic murmur.  There is no pericardial rub appreciated.  SKIN: Clear.  ABDOMEN: Soft, good bowel sounds.  EXTREMITIES: Reveal no edema, there is no evidence of deep venous thrombosis  or clot.  Pulses are intact.  NEUROLOGIC: Exam is intact.   Chest x-ray shows left lower lobe atelectasis.  His CT scan results are  mentioned above in the HPI.  EKG is unremarkable.  He is in sinus rhythm with no ST segment change.   LABORATORY DATA:  His white count is 5700, platelets are 199,000, hemoglobin  is 12.9.  His troponin in the emergency room was 0.36 which is slightly  positive.  His anticoagulation factors were normal.   ASSESSMENT AND PLAN:  1. Bilateral pulmonary emboli that are small but I think are clearly     associated with his clinical presentation, particularly with the rub and     the small left pleural effusion on CT scan.  It was reviewed with the     radiologist.  We have a call in to Dr. Newell Coral to clear him for     anticoagulation.  If he cannot get cleared will go forward with an IVC     filter.  I have discussed this at length with the patient and the  family.     They agree.  Pain control will be apparently be of importance.  2. Other problems are currently inactive.                                                Thomas C. Wall, M.D.    TCW/MEDQ  D:  06/25/2003  T:  06/26/2003  Job:  [id number]   cc:   Charlies Constable, M.D.

## 2011-03-31 NOTE — Consult Note (Signed)
NAME:  [name], [name]                      ACCOUNT NO.:  [id number]   MEDICAL RECORD NO.:  [id number]                   PATIENT TYPE:  INP   LOCATION:  3106                                 FACILITY:  [facility]   PHYSICIAN:  [name], M.D.                   DATE OF BIRTH:  08-16-1928   DATE OF CONSULTATION:  07/09/2003  DATE OF DISCHARGE:                                   CONSULTATION   REFERRING PHYSICIAN:  Hewitt [name], M.D.   HISTORY OF PRESENT ILLNESS:  [name] is a delightful, 77 year old  male, well-known to [name] with history of remote St. Jude aortic  valve replacement, on chronic Coumadin, nonobstructive coronary artery  disease who presents with recurrent headache and diplopia and found to have  acute on chronic subdural hematoma.   The patient was recently diagnosed with chronic bilateral SDH of this past  June following presentation with headache.  He was taken off Coumadin and  underwent bilateral craniectomy on July 26.  During his recovery period and  off Coumadin, he developed pleuritic chest pain and was admitted on August  12, with bilateral pulmonary emboli by chest CT scan and finding of a left  popliteal DVT.  He was placed on intravenous heparin and discharged on  Coumadin on August 18, with an INR goal of 2.0.   However, the patient developed recurrent headache and vertical diplopia and  presented to [name].  A repeat CT scan of the head revealed a new SDH  with overall increased SDH mass effect.  [name] conferred with Dr.  Charlies [name] and agreed to proceed with reversal of anticoagulation,  followed by placement of IVC filter and then repeat bilateral craniectomy.  The patient was treated with 10 mg of oral Vitamin K on admission and has an  INR today of 1.6.   ALLERGIES:  SULFA.  CODEINE.   MEDICATIONS PRIOR TO ADMISSION:  1. Dilantin 100 mg.  2. Lipitor 40 mg q.d.  3. Cardura 4 mg b.i.d.  4. Coumadin 2 mg  q.d.  5. Hydrocodone p.r.n.   PAST MEDICAL HISTORY:  1. St. Jude AVR in 1985.  2. Nonobstructive coronary artery disease.  Coronary angiogram in May 2001.  3. Dyslipidemia.   REVIEW OF SYMPTOMS:  Denies any development of chest pain, dyspnea,  orthopnea, PND, pedal edema or tachycardic palpitations.  Denies any history  of rheumatic fever, thyroid or peptic ulcer disease.  Otherwise, as per HPI.   SOCIAL HISTORY:  Has never smoked tobacco.  Denies alcohol use.   FAMILY HISTORY:  Father deceased at age 45, secondary to myocardial  infarction.  Three brothers deceased, age 4/68 secondary to myocardial  infarction.  Brother age 4 with history of MI.  Mother deceased at age 55  secondary to stroke.   LABORATORY DATA AND X-RAY FINDINGS:  WBC 3.3, hemoglobin 11.3, platelets  151.  Sodium  139, potassium 3.7, glucose 93, BUN 10, creatinine 1.0.  INR  1.6 (1.9 on August 19).   Chest x-ray on August 12, no congestive heart failure.  Electrocardiogram on  August 25, with normal sinus rhythm at 78 beats per minute with left axis  deviation, nonspecific ST abnormalities.   PHYSICAL EXAMINATION:  VITAL SIGNS:  Blood pressure 117/62, pulse 72,  regular rate, respirations 18, temperature 98.3, SAO2 98% on room air.  GENERAL:  This is a 77 year old male in no apparent distress.  HEENT:  Normocephalic, atraumatic.  NECK:  Preserved bilateral carotid pulse without bruit, no jugular venous  distention.  LUNGS:  Clear to auscultation.  HEART:  Regular rate and rhythm, S1, crisp S2; 1/6 soft, systolic ejection  murmur along upper LSB.  ABDOMEN:  Soft, nontender.  Intact bowel sounds.  No bruits.  EXTREMITIES:  Bilateral femoral, popliteal and dorsalis pedis pulses.  No  pedal edema.  NEUROLOGIC:  Alert and oriented x3.  Cranial nerves 2-12 grossly intact.  Preserved upper/lower extremity strength.  Intact finger to nose.   IMPRESSION:  This is a 77 year old male who presents with acute on  chronic  bilateral subdural hematoma on low dose Coumadin with history of remote St.  Jude aortic valve replacement and more recently, bilateral pulmonary emboli  with left lower extremity deep venous thrombosis.   RECOMMENDATIONS:  Proceed with placement of IVC filter as discussed given  the patient's high risk for the patient's recurrent DVT and pulmonary  emboli.  Following this, the plan is to then have the patient return for a  redo bilateral craniectomy on Monday, by [name].  When cleared by Dr.  Newell [name], the patient will then resume long-term Coumadin anticoagulation  with an INR goal of 1.8-2.2.  The patient was seen and examined in  conjunction with Dr. Veneda [name].     [name], P.A. [facility]                      [name], M.D.    GS/MEDQ  D:  07/09/2003  T:  07/09/2003  Job:  [id number]   cc:   [name], M.D.  [address] [place]  [place] [address]  Fax: [phone]

## 2011-03-31 NOTE — Discharge Summary (Signed)
NAME:  [name], [name]                      ACCOUNT NO.:  [id number]   MEDICAL RECORD NO.:  [id number]                   PATIENT TYPE:  INP   LOCATION:                                       FACILITY:  [facility]   PHYSICIAN:  Leighton Roach McDiarmid, M.D.             DATE OF BIRTH:  11-05-1928   DATE OF ADMISSION:  07/30/2003  DATE OF DISCHARGE:  08/02/2003                                 DISCHARGE SUMMARY   DISCHARGE DIAGNOSES:  1. Orthostatic hypotension.  2. Anemia.  3. Benign prostatic hypertrophy.  4. Anticoagulation secondary to St. Jude aortic valve.   CONSULTATIONS:  1. Hewitt Shorts, M.D., neurosurgery.  2. Charlies Constable, M.D., cardiology.   PROCEDURES:  A 2-D echocardiogram on September 17 showed normal left  ventricular function with and EF of 55 to 65%, mechanica valve which  appeared normal, trivial LV regurgitation and mean gradient of 12.9 mmHg.   DISCHARGE MEDICATIONS:  1. Dilantin 200 mg b.i.d.  2. Lipitor 40 mg p.o. q.h.s.  3. Flomax 0.4 mg p.o. daily.   The patient is to stop taking Cardura.   ACTIVITY:  No strenuous activity x 3 days.  When getting out of bed, stand  up slowly.   DIET:  Low fat.   FOLLOW UP:  1. [facility] in one to two weeks for hospital     followup and also to follow up anemia.  2. See Dr. Newell Coral and cardiologist per their instructions.   BRIEF HISTORY:  [name] is a 77 year old white male recently  discharged from [facility] with [place] filter placement after recent  bilateral subdural hematomas on Coumadin therapy.  On the day of admission,  he had gone to the urologist for followup and workup of rental stones and  had an episode of weakness and near syncope.  That evening at home had a  syncopal episode post standing and then post void.   Upon presentation to the emergency department, he was found have orthostatic  hypotension with no changes in his Mini Mental Status exam which was 28/30,  no focal findings on his neurologic or cardiac exams other than consistent  with previous history.   HOSPITAL COURSE:  He was admitted to be worked up further for syncope.  There were no changes in his baseline EKG.  Enzymes were negative for MI.  No episodes of arrhythmia on telemetry throughout hospitalization.  A 2-D  echocardiogram was negative for source of syncope.  It was felt that his  orthostasis may be secondary to Cardura medication.  Therefore, this was  discontinued, and he was discharged home on Flomax.  He had no episodes of  syncope during hospitalization.   #2.  HISTORY OF SUBDURAL HEMATOMA ON COUMADIN THERAPY:  There was no  evidence of seizure activity.  A head CT on September 19,2004, showed mild  decrease in subdural hematoma with decreased density of blood, no acute  disease.  He also had a Dilantin level that was normal and will follow as  above with Dr. Newell Coral.   #3.  BENIGN PROSTATIC HYPERTROPHY:  He has history of urinary retention, and  this continued throughout hospitalization.  Was admitted on Cardura but  switched to Flomax during hospitalization with incomplete relief, but  patient was instructed on in and out catheterization p.r.n. for urinary  obstruction.   #4.  ANEMIA:  On admission, hemoglobin 10.8, which is stable versus 10.7 on  July 14, 2003. This appears to be anemia of chronic disease with MCV of  94.8.  This will continue to be followed by his primary care physician as an  outpatient as he is stable and hemoccult negative in the hospital.   LABORATORY AND X-RAY DATA:  Laboratory on July 31, 2003: WBC 4.5,  hemoglobin 11.1, hematocrit 32, MCV 93.9, platelets 220.  Sodium 140,  potassium 3.9, chloride 106, CO2 25, BUN 9, creatinine 1.1, calcium 9.2.  On  July 30, 2003, LFTs showed albumin 3.4. AST and ALT normal, alkaline  phosphatase 168, total bilirubin 0.6.  Cardiac enzymes on July 30, 2003: Troponin 0.16, 0.20, and 0.10.   Iron 77, TIBC 183, percent saturation  42.  On July 30, 2003, __________16.9 and September 19 was 13.1.   On July 31, 2003, EKG was done:  Normal sinus rhythm with rate of 79,  and no abnormalities or changes.      Ace Gins, MD                        Leighton Roach McDiarmid, M.D.    JS/MEDQ  D:  02/29/2004  T:  03/01/2004  Job:  [id number]   cc:   [facility]   Charlies Constable, M.D. [facility]   Ronald L. Ovidio Hanger, M.D.  [address]  [place]  [place] [address]  Fax: [phone]   Hewitt Shorts, M.D.  [address]  [place]  [place] [address]  Fax: [phone]

## 2011-03-31 NOTE — Discharge Summary (Signed)
NAME:  [name], [name]                      ACCOUNT NO.:  [id number]   MEDICAL RECORD NO.:  [id number]                   PATIENT TYPE:  INP   LOCATION:  3018                                 FACILITY:  [facility]   PHYSICIAN:  Hewitt Shorts, M.D.            DATE OF BIRTH:  [birth year]   DATE OF ADMISSION:  05/03/2003  DATE OF DISCHARGE:  05/06/2003                                 DISCHARGE SUMMARY   HISTORY OF PRESENT ILLNESS:  The patient is a 77 year old male with a seven-  week history of headaches which have been persistent and gradually  worsening.  He was evaluated in the [facility] emergency room.  On  the day of admission CT scan of the brain without contrast revealed  bilateral subacute/chronic subdural hematomas and neurosurgical consultation  was requested.  The patient did not describe any history of trauma or other  cause.  He had undergone work-up at the end of April because of the  headaches including a CT scan of the brain with and without contrast at  [facility] on March 11, 2003 and this study was unremarkable.   The patient denies any nausea, vomiting, blurred vision, diplopia or  weakness.  His history is notable for being status post aortic valve  replacement 19 years ago.  He has a mechanical St. Jude aortic valve and has  been on Coumadin for the past 19 years and is followed by Dr. Charlies Constable.  Past medical history in addition to the aortic valve disease was otherwise  unremarkable.   PAST SURGICAL HISTORY:  Open heart surgery in 1985 and tonsillectomy in  1935.   ALLERGIES:  1. SULFA causes hives.  2. CODEINE causes nausea.   MEDICATIONS PRIOR TO ADMISSION:  1. Coumadin.  2. Lipitor 40 mg every evening.  3. Cardura 44 mg twice daily.  4. Aspirin 81 mg every morning.   PHYSICAL EXAMINATION:  GENERAL:  The patient is a well-developed, well-  nourished white male in no acute distress.  General examination was  unremarkable .  NEUROLOGICAL:  Intact.   HOSPITAL COURSE:  The patient was evaluated in the emergency room and  admitted to the intermediate care unit for observation.  His anticoagulation  was reversed with vitamin K and fresh frozen plasma.  Follow-up of his  reversal was done.  He still had some anticoagulation which was fairly well  reversed with addition of vitamin K.  He has remained neurologically stable.  He was started on Darvocet-N 100 for the headache.  That was insufficient  and he was switched to Vicodin which has been much more effective for him.  He has been allowed to ambulate around the halls and has done well with  that.  He has remained neurologically intact.  Follow-up CT scan of the  brain was done without contrast yesterday and showed no change in the  bilateral holohemispheric subacute/chromic subdural hematomas.  The  patient  was subsequently transferred to the 3000 unit.  He did have some  constipation and this responded well to milk of magnesia.  The case was  discussed with Dr. Charlies Constable.  Our plan is to discharge him to home.  He  is to return to my office in two weeks and to have a CT scan of the brain  done without contrast that day at [facility] and to bring the study  with him to my office for follow-up.  He is to remain off all anticoagulants  and antiplatelet agents including Coumadin, aspirin, Plavix and so on.  He  is to let us know if he has any difficulty.  In the meantime, he was given a  prescription for Vicodin one or two tablets by mouth four times daily as  needed for pain, 60 tablets, no refills.  He states that he is actually only  using about one tablet twice daily at this point.   DISCHARGE DIAGNOSES:  1. Bilateral subacute/chronic subdural hematomas, etiology of which is     uncertain but which may have been contributed to by expurgation dosing     with Coumadin.  2. St. Jude (mechanical) aortic valve.  3. Anticoagulation with Coumadin which  has since been reversed.  4. Headache.                                               Hewitt Shorts, M.D.    RWN/MEDQ  D:  05/06/2003  T:  05/07/2003  Job:  [id number]   cc:   Charlies Constable, M.D.   Dr. Caren Macadam    cc:   Charlies Constable, M.D.   Dr. Caren Macadam

## 2011-03-31 NOTE — Op Note (Signed)
NAMERYLAN, [name] NO.:  [id number]   MEDICAL RECORD NO.:  [id number]          PATIENT TYPE:  INP   LOCATION:  2899                         FACILITY:  [facility]   PHYSICIAN:  Almedia Balls. Ranell Patrick, M.D. DATE OF BIRTH:  1928/05/10   DATE OF PROCEDURE:  05/15/2005  DATE OF DISCHARGE:                                 OPERATIVE REPORT   PREOPERATIVE DIAGNOSES:  Left shoulder impingement and partial-thickness  rotator cuff tear.   POSTOPERATIVE DIAGNOSES:  Left shoulder impingement and full-thickness  rotator cuff tear.   PROCEDURE:  Left shoulder arthroscopy with limited intra-articular  debridement of torn rotator cuff as well as subacromial decompression and  mini open rotator cuff repair.   SURGEON:  Almedia Balls. Ranell Patrick, M.D.   ASSISTANT:  __________ , P.A.-C.   ANESTHESIA:  General plus interscalene block.   ESTIMATED BLOOD LOSS:  Minimal.   FLUIDS REPLACED:  1200 cc of crystalloid.   COUNTS:  Correct.   COMPLICATIONS:  None.   INDICATIONS:  The patient is a 77 year old male with refractory left  shoulder pain.  The patient has a MRI suggesting a partial-thickness rotator  cuff tear and he has failed conservative management consisting of injections  with corticosteroids in the subacromial space, physical therapy, activity  modification, and pain medications.  The patient presents now with disabling  pain and shoulder function loss, desiring surgical treatment for his  suspected rotator cuff tear and informed consent obtained.   DESCRIPTION OF PROCEDURE:  After an adequate level of anesthesia was  achieved, the patient was positioned in a modified beach-chair position and  all neurovascular structures padded appropriately.  The left shoulder was  sterilely prepped and draped in the usual manner.  The shoulder was scoped  arthroscopically using standard arthroscopic portals, anterior, posterior  and lateral, created in a similar fashion with infiltration of  skin with  0.5% Marcaine with epinephrine, followed by an incision with an 11 blade  scalpel and introduction of the cannula into the joint.  Diagnostic  arthroscopy revealed a normal superior labrum, the biceps appeared normal  with the exception of a small frayed area as I retracted the biceps into the  joint.  The subscapularis normal.  The rotator cuff had a full-thickness  tear that we could see through.  This involved the supraspinatus only and  the infraspinatus and teres minor were normal.  The posterior labrum intact.  The glenohumeral articular cartilage in good shape.  At this point, we  placed the scope into the subacromial space and performed a thorough  bursectomy and acromioplasty using butcher block technique, removing the  anterior leading edge spur and releasing the CA ligament.  The patient had a  full-thickness cuff tear that we could visualize through the subacromial  space.  At this point, we made a small mini open incision in the linear skin  lines to approach the cuff.  We split the deltoid in line with the raphe  between the internal and lateral heads.  We easily were able to visualize  the rotator cuff tear, we freshened up the rotator cuff footprint and then  made a small interval slide to allow for the supraspinatus to be  reapproximated to its footprint.  This could easily be pulled over and  showed no signs of significant retraction or scarring.  At this point, we  placed a single #2 Fibrewire suture in a Mason-Allen suture technique at the  free edge of the tendon.  We then placed a single 6.5-mm corkscrew anchor  that was biosorbable by DePuy Mitek adjacent to the articular cartilage.  This gained good purchase and was double loaded with #2 Orthocor suture.  At  this point, we placed two suture anchors up through the tendon and we also  made two small drill holes in the bone which facilitated passage of the  Fibrewire suture through those holes and out the  lateral side of the  humerus.  We then brought the tendon back to its anatomic position, tied the  two Orthocor mattress sutures and then brought the lateral side of the  footprint down with the Fibrewire.  We tied that out of the lateral humerus.  We then took a 0 Vicryl suture and repaired the interval slide and we had a  nice repair.  We thoroughly irrigated the interval and took the shoulder  through a full range of motion and no impingement was noted of the cuff  repair.  We then repaired the deltoid with 0 Vicryl to itself and then the  subcutaneous and skin with a layered closure of Vicryl and Monocryl, the  same for the portals.  Steri-Strips were applied, followed by a sterile  dressing and shoulder sling, and the patient was taken to the recovery room  in stable condition.       SRN/MEDQ  D:  05/15/2005  T:  05/15/2005  Job:  [id number]

## 2011-03-31 NOTE — Consult Note (Signed)
NAME:  [name], [name]                      ACCOUNT NO.:  [id number]   MEDICAL RECORD NO.:  [id number]                   PATIENT TYPE:  INP   LOCATION:  3301                                 FACILITY:  [facility]   PHYSICIAN:  Olga Millers, M.D.                DATE OF BIRTH:  February 07, 1928   DATE OF CONSULTATION:  05/03/2003  DATE OF DISCHARGE:                                   CONSULTATION   [name] is a very pleasant 77 year old male patient of Dr. Juanda Chance who  has a past medical history of aortic valve replacement who now presents with  a headache.  A head CT reveals a subacute bilateral subdural hematoma.  Cardiology is asked to evaluate for aortic valve replacement and Coumadin.  The patient states that he had his aortic valve replaced approximately 18  years ago secondary to aortic stenosis.  There was no bypass surgery at that  time.  He has since been maintained on Coumadin, and he also takes a baby  aspirin per day.  Over the past five to six weeks, he has complained of an  occasional headache.  He did have a head CT on 03/11/2003 that was negative.  His pain has persisted and gradually increased in intensity.  He presented  to the emergency room today with worsening headache, and head CT revealed  subdural hematomas as described above.  Of note, he has no other complaints.  He denies any chest pain or shortness of breath, and there are no  palpitations or syncope.  There were no other neurological symptoms.   MEDICATIONS PRIOR TO ADMISSION:  1. Cardura 4 mg p.o. b.i.d.  2. Coumadin 2 mg on Monday and 4 mg the remaining days.  3. Aspirin 81 mg p.o. daily.  4. Lipitor 40 mg p.o. daily.   ALLERGIES:  SULFA and CODEINE.   PAST MEDICAL HISTORY:  1. Aortic valve replacement with a St. Jude valve approximately 18 years     ago.  This was secondary to aortic stenosis.  2. History of benign prostatic hypertrophy and is maintained on Cardura.  3. Prior tonsillectomy.   The  remaining past medical history is negative.   SOCIAL HISTORY:  He does not smoke, nor does he consume alcohol.   FAMILY HISTORY:  Positive for coronary artery disease.   REVIEW OF SYSTEMS:  He denies any fevers or chills.  His headache is as  described above.  He denies any hemoptysis or shortness of breath.  There is  no dysphagia, odynophagia, melena, or hematochezia.  There is no dysuria or  hematuria.  There is no rash or seizure activity.  There is no orthopnea,  PND, or pedal edema.  He denies any dysarthria or incontinence.  There is no  loss of strength or sensation in his extremities.  There is no visual  disturbance.  The remaining systems are negative.   PHYSICAL EXAMINATION:  VITAL SIGNS:  Blood pressure 119/64, pulse 74.  He is  afebrile.  GENERAL:  Well developed, well nourished.  He does not appear to be in any  cute distress, although he does complain of headache.  He does not appar to  be depressed.  SKIN:  Warm and dry.  There is no peripheral clubbing.  His skin shows prior  sternotomy but is otherwise unremarkable.  HEENT:  Unremarkable with normal eyelids.  His pupils are equally round and  reactive to light.  NECK:  Supple with a normal upstroke bilaterally.  There are no bruits  noted.  There is no jugular venous distention and no thyromegaly noted.  CHEST: Clear to auscultation and percussion.  CARDIOVASCULAR:  Regular rate and rhythm.  Crisp mechanical valve sound.  I  could not appreciate either systolic or diastolic murmurs.  There is no S3  or S4.  ABDOMEN:  Nontender, nondistended.  Positive bowel sounds.  No  hepatosplenomegaly and no mass appreciated.  There is no abdominal bruit.  EXTREMITIES:  He has 2+ femoral pulses bilaterally and no bruits.  His  extremities demonstrate no edema, and I can palpate no cords.  He has 2+  dorsalis pedis pulses bilaterally.  NEUROLOGIC:  Exam is grossly intact.  His motor strength is 5/5 in all  extremities, and his  sensation is normal to light touch.   LABORATORY DATA:  Hemoglobin 14, hematocrit 40, platelet count 141, white  blood cell count 4.2.  INR is elevated at 3.4.   His head CT is remarkable for the subdural hematomas as described above per  radiology.   DIAGNOSES:  1. Subacute bilateral subdural hematomas.  2. Status post St. Jude aortic valve replacement.  3. History of chronic Coumadin and aspirin use.  4. History of benign prostatic hypertrophy.   PLAN:  Mr. Bevens presents with bilateral subdural hematomas.  I agree  with vitamin K to reverse his Coumadin.  Neurosurgery is evaluating the  patient and will admit.  There is a questionable need for surgical  intervention.  We will follow while the patient is in the hospital.  I would  recommend resuming his anticoagulation when neurosurgery feels this is safe.  Given his intracranial hemorrhage, we will need to decrease our goal INR to  approximately 2.5.  I would also discontinue his aspirin completely and not  resume this medication in the future.  We will follow his exam daily.  There  is increased risk of valve thrombosis off of his Coumadin, but there is no  option at this point given his intracranial hemorrhage.                                               Olga Millers, M.D.    BC/MEDQ  D:  05/03/2003  T:  05/04/2003  Job:  [id number]

## 2011-03-31 NOTE — H&P (Signed)
[name], [name]            ACCOUNT NO.:  [id number]   MEDICAL RECORD NO.:  [id number]          PATIENT TYPE:  INP   LOCATION:  1846                         FACILITY:  [facility]   PHYSICIAN:  Sandria Bales. Ezzard Standing, M.D.  DATE OF BIRTH:  [birth year]   DATE OF ADMISSION:  10/20/2004  DATE OF DISCHARGE:                                HISTORY & PHYSICAL   HISTORY OF PRESENT ILLNESS:  This 77 year old white male who is a patient of  Dr. Montey Hora at [facility] followed from a  cardiac standpoint by Dr. Charlies Constable, has had two or three episodes of  epigastric pain over the last two weeks which had been somewhat nonspecific.  He has mentioned this to Dr.  Montey Hora.  And was advised if he had  further problem he was to come to the ER which he did this morning.   He has never had any ulcer disease, liver disease, jaundice, pancreatic  disease or colon disease.  He had no prior abdominal surgery and no body in  his family has gallbladder disease.   ALLERGIES:  1.  CODEINE makes his sick.  2.  SULFA leads to hives.   CURRENT MEDICATIONS:  1.  Coumadin 3 mg daily.  2.  Flomax 0.4 mg daily.  3.  Lipitor 40 mg daily.  4.  Levoxyl 25 mcg daily.   REVIEW OF SYMPTOMS:  NEUROLOGIC:  Apparently had bilateral subdural  hematomas in the summer of 2004 requiring bilateral craniotomies by Dr.  Shirlean Kelly.  He has recovered from these with no long lasting  neurologic deficit.  PULMONARY:  He has no history of pneumonia or tuberculosis. He did have a  IVC filter placed in the summer of 2004 for DVT.  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 Kennon Portela and Ines Bloomer, M.D., and has done well  with that valve since that time.  Is followed chronically by Dr. Juanda Chance  about every six months to a year.  He is on  chronic anticoagulation for the  St. Jude's valve.  GASTROINTESTINAL:  See history of present  illness.  GENITOURINARY:  He had some BPH before.  He has been placed on Flomax.  He  has seen Dr. Dannette Barbara and Dr. Alto Denver a long time ago for kidney stones, more  recently about a year or two ago.  He did see Dr. Darvin Neighbours for some  prostate complaints.   Apparently he is having no chronic problems that he is aware of.  He is  accompanied by his wife and two sons in the emergency room.   PHYSICAL EXAMINATION:  GENERAL APPEARANCE:  He is a well-nourished, though  somewhat indecisive male.  VITAL SIGNS:  Temperature 97.1, pulse 65, blood pressure 127/68.  HEENT:  Unremarkable.  NECK:  Supple without mass or thyromegaly.  LUNGS:  Clear to auscultation.  CARDIOVASCULAR:  He has a loud snapping second heart sound.  He has a well  healed median sternotomy scar.  I hear no murmur.  ABDOMEN:  He has some epigastric fullness.  No tenderness, no guarding and  no rebound.  He has been medicated.  He has no abdominal scars or  organomegaly that I can feel.  EXTREMITIES:  He has grossly normal upper and lower extremities.  NEUROLOGIC:  Grossly intact.   Labs:  White blood cell count 8300, hemoglobin 15.8, hematocrit 44.9, platelet  count 172,000.  Urinalysis is negative.  Sodium 138, potassium 5.1, chloride  105, glucose of 91, creatinine 1.1.  Alkaline phosphatase is 92, total  bilirubin 1.4.  He has a PT of 24.3 with INR of 3.0.  Lipase is 26.   He had a KUB which showed probable cholelithiasis and 1.2 cm calcified  splenic artery aneurysm.  Ultrasound showed multiple gallstones with  thickened gallbladder wall and a left renal cyst.   IMPRESSION:  1.  Symptomatic cholelithiasis.  I do not think he has acute cholecystitis      but with his underlying cardiac disease, it would probably be best if he      was admitted, placed on IV fluids and then following his coagulation      status, have an elective cholecystectomy in anywhere from two to seven      days.  I discussed this with the patient  and his wife the indications      and potential complications of gallbladder surgery.  Potential      complications include, but not limited to, bleeding, infection, bile      leak, injury and the possibility of open surgery.   1.  St. Jude valve on chronic anticoagulation with his INR at 3.0.  We will      consult Dr. Regino Schultze service to help [place] manage his anticoagulation.  2.  Status post craniotomy (Dr. Chong Sicilian) for which he is recovered with      no long-term follow-up.  3.  History of deep venous thrombosis.  He has an inferior vena cava filter      placed in 2004.  4.  Benign prostatic hypertrophy followed by Dr. Montey Hora.  He has seen      Dr. Darvin Neighbours over a year ago but not seeing him chronically.  5.  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  6.  Left renal cyst which appears to be benign.  7.  Hypothyroidism, recently started on Levoxyl.  Will check this level.  8.  Hypercholesterolemia on Lipitor.      [name]   DHN/MEDQ  D:  10/20/2004  T:  10/20/2004  Job:  [id number]   cc:   Montey Hora, M.D.  [facility]   Charlies Constable, M.D. [address] [facility]   Ronald L. Ovidio Hanger, M.D.  [address]  [place]  [place] [address]  Fax: [phone]

## 2011-03-31 NOTE — H&P (Signed)
NAME:  [name], [name]                      ACCOUNT NO.:  [id number]   MEDICAL RECORD NO.:  [id number]                   PATIENT TYPE:  INP   LOCATION:  3301                                 FACILITY:  [facility]   PHYSICIAN:  Hewitt Shorts, M.D.            DATE OF BIRTH:  July 18, 1928   DATE OF ADMISSION:  05/03/2003  DATE OF DISCHARGE:                                HISTORY & PHYSICAL   HISTORY OF PRESENT ILLNESS:  The patient is a 77 year old right-handed white  male with a seven or more week history of headaches which have been  persistent and gradually worsening.  He was evaluated today in the [facility] Emergency Room by Dr. Cathren Laine, the emergency  room physician, and was found by CT scan of the brain without contrast to  have bilateral subacute/chronic subdural hematoma.  The hematomas are  holohemispheric and approximately 1 cm in thickness.   The patient explains that his headaches began seven or more weeks ago  without any particular cause.  He explained that they could be located in  various areas, at time frontal lobe, at times occipital, at other times  temporal, at other times on the vertex.  They have persisted and gradually  worsened.   The patient was initially evaluated by Birdena Jubilee at [facility].  CT scan of the brain was done at [facility] on  03/11/2003 without and with contrast.  This study was unremarkable.   The patient lived with the symptoms up until last week when he saw Dr.  Zenda Alpers and was given an antibiotics, but after three or four days he was no  better.  He returned to [facility] two days ago and  was given a prescription for Cephadyn (butalbital and acetaminophen), and  that medication gave him no relief.  Therefore, he presented to the  emergency room.   Clinically, the patient denies any nausea, vomiting, blurred vision,  diplopia, or weakness.  He also  denies any history of trauma to his head,  although he is quite physically active, doing a lot of work around his yard  and home including running a tiller and such.  History is also notable for  being status post aortic valve replacement 19 years ago.  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 Jude aortic valve for which he has required  Coumadin therapy and is followed by Dr. Charlies Constable.   PAST MEDICAL HISTORY:  Notable for a history of aortic valve disease.  He  presented 19 years ago with shortness of breath with physical activity which  gradually worsening.  He developed dizziness and presyncope.  He was  evaluated by Dr. Alona Bene in [place] and referred to [facility].  Workup revealed aortic valve disease, and he underwent aortic valve  replacement by Dr. Edwyna Shell on 04/19/1994 and has continued on Coumadin since  that  time.  He has no history of hypertension, myocardial infarction,  cancer, stroke, diabetes, peptic ulcer disease, or lung disease.   PAST SURGICAL HISTORY:  1. Open heart surgery in 1995.  2. Tonsillectomy in 1935.   ALLERGIES:  He reports allergy to SULFA which causes hives, PROTEIN which  causes nausea.   MEDICATIONS PRIOR TO ADMISSION:  1. Coumadin.  2. Lipitor 40 mg q.p.m.  3. Cardura 4 mg b.i.d.  4. Aspirin 81 mg q.a.m.   FAMILY HISTORY:  Mother died at age 39 of a stroke.  His father died at age  1 of a heart attack.   SOCIAL HISTORY:  The patient is retired.  He is married.  He is a smoker.  He does not drink alcoholic beverages.   REVIEW OF SYSTEMS:  Notable as described in History of Present Illness and  Past Medical History.  The 14-point review sheet is otherwise unremarkable.   PHYSICAL EXAMINATION:  GENERAL:  Well developed, well nourished white male  in no acute distress.  VITAL SIGNS:  Temperature 97.4, pulse 74, blood pressure 119/65, respiratory  rate 18.  LUNGS:  Clear to auscultation.  He has symmetric respiratory  excursion.  HEART:  Regular rate and rhythm with a clear valvular sound.  There is no  murmur.  NEUROLOGIC:  Cranial nerves show pupils to be equal, round, and reactive to  light and approximately 2 mm bilaterally.  Extraocular movements are intact.  His face is symmetrical. Hearing is intact.  Palate moves symmetrically.  Shoulder shrug is symmetrical.  Tongue is in the midline.  Motor examination  shows 5/5 strength in the upper and lower extremities.  He has no drift of  the upper extremities.  Sensation is intact to pinprick in the upper and  lower extremities.  Reflexes are 1 to 2 in the biceps.  The quadriceps are 2  to 3.  The gastrocnemius are 1.  Toes are downgoing bilaterally.  Gait and  stance are note tested.   DIAGNOSTIC STUDIES:  CT of the brain without contrast done today at [facility] reveals bilateral holohemispheric subacute/chronic subdural  hematomas.  There is obliteration of the sulci and fissures and compression  of the ventricles, and this is as compared to his previous CT scan done  03/11/2003.   IMPRESSION:  1. Bilateral subacute/chronic subdural hematomas, etiology uncertain, but it     sounds like this is contributed to by anticoagulation with Coumadin.  2. St. Jude (mechanical) aortic valve.  3. Anticoagulation with Coumadin for #2.   PLAN:  The patient will be admitted to the neurosurgical intensive care unit  (NACU).  Neurological checks will be initiated.  The patient's  anticoagulation will be reversed with vitamin K and fresh frozen plasma.  The patient will be given analgesics for his headaches, and we will obtain a  followup CT scan on the morning of June 22 (approximately 36 hours from  now).  Will monitor the scan for increasing or decreasing subdural size as  well as monitor his neurologic function and difficulties as well as his  headaches.  We will hold off on surgical evacuation for now, and [facility] will be consulted.    I have had the opportunity to discuss this case with Dr. Olga Millers from  the [facility].  He agrees with reversal of anticoagulation  and monitoring and understands the possible eventual need for surgical  evacuation but also understands that are plan for now is to  monitor the  patient and see if hematomas may resolve on their own over the coming  months.   I have spoken with the patient, his wife, his sons, his daughter-in-laws,  and other family members at length regarding his condition, recommendations  for treatment, and the risks regarding his condition.  Their questions  regarding this were answered.   At this time, I feel it best to reverse his anticoagulation and monitor the  patient.  I am hopeful that the subdural hematomas may gradually resolve  over the next month or so.  This will require that he remain off  anticoagulation until they have resolved.  However, these subdural hematomas  may increase as time goes on.  If that is the case, surgical evacuation may  become necessary.  Also, he may become more symptomatic as time goes on, and  that also may lead to the need to proceed with surgical evacuation.  The  patient, his family, and [facility] understand this.   The patient and his family understand that there are certainly significant  risks regarding these subdural hematomas and certainly risk if  anticoagulation were continued.  However, they also understand that reversal  of the anticoagulation and leaving him without anticoagulation for several  weeks entails significant risk to his mechanical heart valve including  failure of the valve itself as well as risk of embolic phenomenon to the  brain, kidneys, intestines, or bowels, or other vital structures.   They understand that there is risk with any and all interventions in this  circumstance, but that the prudent intervention at this time is reversal of  anticoagulation and  continued monitoring.                                               Hewitt Shorts, M.D.    RWN/MEDQ  D:  05/03/2003  T:  05/04/2003  Job:  [id number]   cc:   Charlies Constable, M.D.   Caren Macadam, M.D.  [facility]    cc:   Charlies Constable, M.D.   Caren Macadam, M.D.  [facility]

## 2011-03-31 NOTE — Op Note (Signed)
NAME:  [name], [name]                      ACCOUNT NO.:  [id number]   MEDICAL RECORD NO.:  [id number]                   PATIENT TYPE:  INP   LOCATION:  3106                                 FACILITY:  [facility]   PHYSICIAN:  Veneda Melter, M.D.                   DATE OF BIRTH:  01-25-1928   DATE OF PROCEDURE:  07/10/2003  DATE OF DISCHARGE:                                 OPERATIVE REPORT   PROCEDURES PERFORMED:  1. Inferior vena cava venogram.  2. Placement trapeze inferior vena cava filter.   DIAGNOSES:  1. Pulmonary embolus.  2. Deep vein thrombosis.  3. Subdural hematoma.  4. Left renal vein thrombosis.   HISTORY OF PRESENT ILLNESS:  [name] is a 77 year old white male with  a history of St. Jude's mechanical aortic valve replacement on Coumadin  therapy who presents with chronic and recurrent subdural hematomas.  He has  had craniectomy and evacuation of his hematomas performed.  Unfortunately,  the patient has had persistence of hematoma which required repeat surgery.  He has recently developed pulmonary embolus with left lower extremity DVT  and due to contraindications for anticoagulation, he is referred for IVC  placement.   DESCRIPTION OF PROCEDURE:  Informed consent was obtained.  The patient was  brought to the catheterization lab.  A 6 French sheath was placed in the  right femoral vein using the modified Seldinger technique.  A 6 French long  sheath was advanced into the inferior vena cava and manual injections of  contrast performed for positioning.  A 5 French pigtail catheter was  advanced and a inferior venogram performed using power injections of  contrast.  This showed the inferior vena cava to be a large caliber vessel  with no luminal disease.  The right renal vein was widely patent.  Left  renal vein exhibited sluggish flow in its proximal segment suggestive of  thrombosis.  Appropriate positioning of the sheaths was then made, and a  trapeze IVC  filter positioned. This was then deployed.  Repeat angiography  confirmed positioning was below the renal veins.  There was full apposition  of the filter with vessel wall and no evidence and no evidence of vessel  damage or extravasation of contrast.  The sheath was retracted and  manual pressure applied to the right groin until adequate hemostasis was  achieved.  The patient tolerated the procedure well and was transferred to  the floor in stable condition.   FINAL RESULT:  Successful placement of Trapeze IVC filter.                                               Veneda Melter, M.D.    [name]  D:  07/10/2003  T:  07/11/2003  Job:  [id number]  cc:   Charlies Constable, M.D.   Hewitt Shorts, M.D.  [address]  [place]  [place] [address]  Fax: [phone]

## 2011-03-31 NOTE — Discharge Summary (Signed)
   NAME:  [name], [name]                      ACCOUNT NO.:  [id number]   MEDICAL RECORD NO.:  [id number]                   PATIENT TYPE:  INP   LOCATION:  4704                                 FACILITY:  [facility]   PHYSICIAN:  Charlies Constable, M.D.                  DATE OF BIRTH:  27-Apr-1928   DATE OF ADMISSION:  06/25/2003  DATE OF DISCHARGE:  07/02/2003                           DISCHARGE SUMMARY - REFERRING   DISCHARGE DIAGNOSES:  1. Bilateral pulmonary emboli.  2. Left lower extremity deep vein thrombosis.  3. Chronic bilateral subdural hematomas status post surgery May 03, 2003,     with Dr. Newell Coral.  4. Nonobstructive coronary artery disease.  5. St. Jude mechanical aortic valve in 1985.  6. Long-term Coumadin use.   HOSPITAL COURSE:  [name] is a 77 year old male patient who is  admitted on June 25, 2003, after experiencing pleuritic-type chest pain.  Chest CT in the emergency room revealed bilateral pulmonary emboli and a  small left pleural effusion.  Head CT revealed stable bilateral subdural  hygromas with decreased pneumocephalus.  Ultrasound, apparently, revealed a  left popliteal DVT.  A neurosurgical consultation was obtained by Dr.  Newell Coral and after the CT scan was cleared, the patient was started on  Coumadin.  His therapeutic INR goal is 2.0.  Today, on discharge on July 02, 2003, his INR is 1.9.  We will have him return to the office tomorrow to  check an INR.   Lab studies during his hospital stay also include a hemoglobin of 13.1,  hematocrit 37.8, white count 3.9, platelets 171,000.  Sodium 138, potassium  4.2, BUN 13, creatinine 1.2, maximum troponin was 0.41 with cardiac  isoenzymes.  Dilantin level 8.7.   DISCHARGE MEDICATIONS:  1. Dilantin 300 mg q.a.m.  2. Lipitor 40 mg daily.  3. Cardura 4 mg daily.  4. Coumadin 2 mg daily.  5. Tylenol 1-2 tablets q.6h. p.r.n. pain.   ACTIVITY:  As tolerated.  Low-fat diet.   We will need him to  followup on his Coumadin level on July 03, 2003, at  12:00 p.m.  He will follow with Dr. Charlies Constable on July 14, 2003, at  10:00 a.m.      Guy Franco, P.A. [facility]                      Charlies Constable, M.D.    LB/MEDQ  D:  07/02/2003  T:  07/02/2003  Job:  [id number]

## 2011-03-31 NOTE — Cardiovascular Report (Signed)
[facility]  Patient:    [name], [name]                   MRN: [id number] Proc. Date: 04/04/00 Adm. Date:  [id number] Disc. Date: [id number] Attending:  Lenoria Farrier CC:         Monica Becton, M.D.             Cardiac Catheterization Lab                        Cardiac Catheterization  CLINICAL HISTORY:  Mr. Corporan is 77 years old and had a St. Jude aortic valve replacement in 1985.  He has positive risk factors with hyperlipidemia and a positive family history and had a Cardiolite scan as a screening procedure and was found to have posterior ischemia.  He was taken off Coumadin and Lovenox overlap was used.  DESCRIPTION OF PROCEDURE:  The procedure was performed via the right femoral artery using an arterial sheath and 6-French preformed coronary catheters.  A front wall arterial puncture was performed, and Omnipaque contrast was used. No left ventriculogram was performed because of the prosthetic aortic valve. The right femoral artery was closed with Perclose at the end of the procedure to facilitate early re-anticoagulation.  The patient tolerated the procedure well and left the laboratory in satisfactory condition.  RESULTS: 1. The left main coronary artery was free of significant disease. 2. The left anterior descending artery gave rise to two septal perforators    and two diagonal branches.  There was 40% proximal narrowing and 30%    narrowing in the mid vessel.  The rest of the vessel was free of    significant disease. 3. The circumflex gave rise to a marginal branch and a posterolateral    branch.  These were also free of significant disease. 4. The right coronary artery gave rise to a conus branch, a right ventricular    branch, a posterior descending branch, and a posterolateral branch.  These    vessels were free of significant disease.  No left ventriculogram was performed.  CONCLUSION:  Nonobstructive coronary  artery disease.  RECOMMENDATIONS:  Reassurance.  In view of these findings, I think the Cardiolite probably represents a false positive scan.  Will plan continued risk factor modification.  Will plan to send him home today and resume his Coumadin tonight and give him Lovenox today and have him seen in the Coumadin clinic for continued Lovenox overlap until his INR becomes therapeutic. DD:  04/04/00 TD:  04/07/00 Job: [id number]

## 2011-03-31 NOTE — Discharge Summary (Signed)
NAMESUVAN, STCYR NO.:  [id number]   MEDICAL RECORD NO.:  [id number]          PATIENT TYPE:  INP   LOCATION:  5034                         FACILITY:  [facility]   PHYSICIAN:  Almedia Balls. Ranell Patrick, M.D. DATE OF BIRTH:  [birth year]   DATE OF ADMISSION:  05/15/2005  DATE OF DISCHARGE:  05/17/2005                                 DISCHARGE SUMMARY   ADMISSION DIAGNOSES:  Left shoulder pain and possible rotator cuff tear.   DISCHARGE DIAGNOSIS:  Left shoulder pain secondary to arthroscopy and  rotator cuff repair.   BRIEF HISTORY:  The patient is a 77 year old male who had some continuing  left shoulder pain.  MRI shows a probable rotator cuff tear.  The patient  has elected to have an arthroscopy and possible rotator cuff repair  completed by Dr. Malon Kindle that was scheduled for May 15, 2005.   PROCEDURE:  The patient had left shoulder arthroscopy with repair of the  left rotator cuff and also open DCR.  Attending surgeon was Malon Kindle,  M.D., assistant was Standley Dakins, P.A.-C.  Estimated blood loss was minimal,  general anesthesia was used and there were no complications.   HOSPITAL COURSE:  The patient was admitted for left shoulder arthroscopy and  open DCR and rotator cuff repair.  The patient was on Coumadin and thus  needed to be admitted for observation and to get his INR back to being  therapeutic before being discharged home.  The patient had the above-stated  procedure on May 15, 2005 by Dr. Malon Kindle, the patient tolerated the  procedure well and was transferred to the post anesthesia care unit and then  up to 5000.  On postoperative day 1, the patient stated he had very little  pain, he denied any muscle spasms and neurovascularly he was intact  distally.  Postoperative day 2, the patient was doing very well,  neurovascularly he was intact, and he went through some general exercises  while in the bed without difficulty.  Capillary refill was less  than 2  seconds.  The dressing was changed and the incision was healing well with no  signs of cellulitis, erythema, infection, or drainage.  Overall, he was  doing well.  His INR was 1.6 on postoperative day 2, thus he was to be  discharged home.   DISCHARGE PLAN:  The patient will be discharged home on May 17, 2005.   FOLLOWUP:  The patient is to follow up with Dr. Malon Kindle in 10 days.   CONDITION:  Stable.   DIET:  Regular.   ACTIVITY:  No weightbearing with that left arm.   DISCHARGE MEDICATIONS:  1.  Percocet 5/325 one tablet q.4-6h. p.r.n. pain.  2.  Robaxin 500 mg q.6h. p.r.n. pain.  3.  Coumadin.   ALLERGIES:  CODEINE AND SULFA.      Shella Spearing   TBD/MEDQ  D:  05/17/2005  T:  05/17/2005  Job:  [id number]

## 2011-03-31 NOTE — Consult Note (Signed)
NAME:  [name], [name]                      ACCOUNT NO.:  [id number]   MEDICAL RECORD NO.:  [id number]                   PATIENT TYPE:  INP   LOCATION:  4704                                 FACILITY:  [facility]   PHYSICIAN:  Hewitt Shorts, M.D.            DATE OF BIRTH:  1927-11-29   DATE OF CONSULTATION:  DATE OF DISCHARGE:                                   CONSULTATION   HISTORY OF PRESENT ILLNESS:  The patient is a 77 year old man who has been  under my care since late June of this year.  He was admitted two days ago by  the [facility]iology Service under whose care he has been for a couple of  decades with a newly diagnosed pulmonary embolus.   His history is notable for having had a mechanical heart valve placed 19  years ago.  He had been on Coumadin anticoagulation therapy under the  direction of Dr. Charlies Constable for many years.  The patient developed  headaches in April of 2004.  He was evaluated by his primary physician with  a CT scan of the brain that was unremarkable.  His headaches continued and  on May 03, 2003, he presented to the [facility] Emergency Room for  evaluation of the headaches.  CT scan of the brain was obtained which  revealed bilateral chronic subdural hematomas and neurosurgical consultation  was requested.  I admitted the patient at the time.  Anticoagulation was  reversed with fresh frozen plasma and vitamin K.  His subdural hematomas  were monitored while in the hospital and subsequently after discharge to  home with followup CT scans and office visits.  The hope was that the  chronic subdural hematomas might resolve on their own.  Unfortunately, there  was no improvement but on the other hand there was no worsening of the  chronic subdural hematomas.  However, his headaches continued and by one  month later, while he remained off anticoagulation therapy, decision was  made to proceed with bilateral evacuation of the subdural  hematomas and  therefore, the patient was admitted on June 12, 2003, and underwent  bilateral frontal and parietal craniectomies and evacuation of subdural  hematoma.  Following surgery, the patient did well and was subsequently,  after several days, discharged to home and, in fact, returned for a followup  visit in my office on June 23, 2003. CT scan of the brain had been done  earlier that day at [facility].  It showed reaccumulation of the  bilateral subdural hematomas.  They appeared low density, consistent with  chronic appearance.  However, the patient's headaches had improved  significantly immediately following surgical evacuation of the hematomas and  continued to be improved.  He does not describe any significant headaches  and notes the headaches that he had been experiencing from April through  July of this year are not present.   We had discussed  in the office that he should follow up with Dr. Juanda Chance and  have his anticoagulation resumed with Coumadin with a goal of an INR of 2.   However, a little over two days ago at about midnight, the patient began to  develop left chest discomfort.  Within an hour or two, the pain had become  severe and he presented to the [facility] Emergency Room  where he was evaluated by Dr. Juanito Doom from the [facility] Cardiology Service.  The patient had difficulty breathing because of pain.  His breaths were  shallow because more deep inspiration exacerbated the pain.  Workup included  CT scan of the chest which revealed pulmonary embolism and the patient was  admitted by Dr. Daleen Squibb to the [facility].  Dr. Daleen Squibb spoke to  me by phone in the operating room and we agreed to well-controlled  anticoagulation after weighing the risks and benefits to this patient in  this complex situation.  We felt that an appropriate goal was a PTT of 60  seconds.   Currently at this time, the patient is comfortable.  He denies  any  significant headache and he denies any neurologic difficulties.   PAST MEDICAL HISTORY:  His heart valve placement 19 years ago.   PAST SURGICAL HISTORY:  Open heart surgery as well as his bilateral  craniectomies as described above.   ALLERGIES:  He is intolerant to both SULFA and CODEINE.   MEDICATIONS:  1. Dilantin 300 mg q.a.m.  2. Lipitor 40 mg daily.  3. Cardura 4 mg b.i.d.   FAMILY HISTORY:  His parents have passed on.   SOCIAL HISTORY:  The patient is married.  He is retired.  He does not smoke.   REVIEW OF SYSTEMS:  All other systems as described above; otherwise,  unremarkable.   PHYSICAL EXAMINATION:  GENERAL:  Well-developed, well-nourished, white male  in no acute distress.  VITAL SIGNS:  Temperature 97.7, pulse 80, blood pressure 125/55, respiratory  rate 16, O2 saturation 95% on room air.  HEENT:  Well-healed cranial incisions without swelling, erythema or  drainage.  NEUROLOGIC:  The patient is awake, alert.  He is oriented to his name, [facility]  and 2004.  He follows commands.  Speech is fluent.  He has  good comprehension.  Cranial nerves show pupils are equal, round and  reactive to light and about 2.5 mm bilaterally.  Extraocular movements are  intact.  His face is symmetrical.  Motor examination showed 5/5 strength in  the upper and lower extremities.  He has no drift to the upper extremities.  Sensation is intact to pinprick throughout.  Reflexes are symmetrical.  Gait  and stance were not tested.   IMPRESSION:  1. Bilateral subdural hematomas reaccumulated, status post evacuation July     30, but chronic in appearance, not currently symptomatic.  That is, the     patient denies any headaches and has no neurologic complaints or     deficits.  2. Pulmonary embolism.  3. Mechanical heart valve (St. Jude type).   RECOMMENDATIONS: 1. Followup CT scan of the brain as requested for Monday, August 16.  Study     will be done without  contrast.  2. I agree with anticoagulation with heparin with a goal of PTT of 60     seconds and Coumadin with a goal of an INR of 2.  3. Will check a followup Dilantin level.  The Dilantin has been  given for     seizure prophylaxis.  The patient has never had a seizure.  He currently     is on 300 mg q.a.m.   I had an opportunity to speak with the patient, his wife and his daughter at  length in his hospital room today.  I discussed our assessment and plans for  treatment.  Their questions were answered.                                               Hewitt Shorts, M.D.   RWN/MEDQ  D:  06/27/2003  T:  06/27/2003  Job:  [id number]   cc:   Thomas C. Wall, M.D.   Charlies Constable, M.D.

## 2011-04-13 ENCOUNTER — Other Ambulatory Visit: Payer: Self-pay | Admitting: *Deleted

## 2011-04-13 DIAGNOSIS — E7800 Pure hypercholesterolemia, unspecified: Secondary | ICD-10-CM

## 2011-04-13 MED ORDER — EZETIMIBE-SIMVASTATIN 10-40 MG PO TABS: 1.0000 | ORAL_TABLET | Freq: Every day | ORAL | Status: DC

## 2011-05-08 ENCOUNTER — Telehealth: Payer: Self-pay | Admitting: Cardiology

## 2011-05-08 NOTE — Telephone Encounter (Signed)
Will forward to Dr Shirlee Latch for review.  Only other statin I see listed is Lipitor from 2006 in hospital records.

## 2011-05-08 NOTE — Telephone Encounter (Signed)
Pt and his wife are both on vytorin and it is so expensive they can't afford it and he was wondering if Dr. Shirlee Latch could give them generic or something that is cheaper

## 2011-05-09 MED ORDER — ATORVASTATIN CALCIUM 80 MG PO TABS: 80.0000 mg | ORAL_TABLET | Freq: Every day | ORAL | Status: DC

## 2011-05-09 NOTE — Telephone Encounter (Signed)
Marca Ancona, MD 05/09/2011 3:56 PM Signed  Have him take generic atorvastatin 80 mg daily Charolotte Capuchin, RN 05/08/2011 2:47 PM Signed  Will forward to Dr Shirlee Latch for review. Only other statin I see listed is Lipitor from 2006 in hospital records. Lela Vivia Birmingham 05/08/2011 1:24 PM Signed  Pt and his wife are both on vytorin and it is so expensive they can't afford it and he was wondering if Dr. Shirlee Latch could give them generic or something that is cheaper

## 2011-05-09 NOTE — Telephone Encounter (Signed)
I talked with pt. He will stop Vytorin and start Atorvastatin 80mg  daily.

## 2011-05-09 NOTE — Telephone Encounter (Signed)
Have him take generic atorvastatin 80 mg daily

## 2011-06-05 ENCOUNTER — Encounter: Payer: Self-pay | Admitting: Cardiology

## 2011-06-07 ENCOUNTER — Encounter: Payer: Self-pay | Admitting: Cardiology

## 2011-06-07 ENCOUNTER — Ambulatory Visit (INDEPENDENT_AMBULATORY_CARE_PROVIDER_SITE_OTHER): Payer: Medicare PPO | Admitting: Cardiology

## 2011-06-07 VITALS — BP 126/60 | HR 70 | Ht 71.0 in | Wt 152.0 lb

## 2011-06-07 DIAGNOSIS — I359 Nonrheumatic aortic valve disorder, unspecified: Secondary | ICD-10-CM

## 2011-06-07 DIAGNOSIS — E785 Hyperlipidemia, unspecified: Secondary | ICD-10-CM

## 2011-06-07 DIAGNOSIS — Z952 Presence of prosthetic heart valve: Secondary | ICD-10-CM

## 2011-06-07 NOTE — Progress Notes (Signed)
PCP: Dr. Modesto Charon  77 yo with history of mechanical aortic valve replacement, subdural hematoma requiring craniotomy in 2004, and small CVA in 2/12 presents for cardiology followup.  He has been seen by Dr. Juanda Chance in the past and is seen by me for the first time today. In general, he does well symptomatically and is very active.  He works in his garden with no problems.  No exertional dyspnea or chest pain. He had a small CVA in 2/12 with a transient facial droop.  He had not been taking ASA since his subdural hematoma in 2004 but had been on coumadin.  After the CVA, he resumed ASA 81 mg daily.    ECG: NSR, LVH, slight anterolateral ST depression  PMH: 1. Hypothyroidism 2. Cholecystectomy 2005 3. St. Jude mechanical aortic valve replacement in 1985 for aortic stenosis.  Last echo (12/05) with EF 60%, normal mechanical valve 4. DVT/PE (post-operative).  Has IVC filter.  5. Subdural hematoma requiring craniotomy in 2004.  6. Hyperlipidemia 7. Left heart cath (5/01): mild nonobstructive CAD 8. CVA: 2 /12, small with no residual effects.  9. Left shoulder surgery  SH: Married Eulah Citizen [place]), lives outside [place], nonsmoker.   FH: Noncontributory.   Current Outpatient Prescriptions  Medication Sig Dispense Refill  . docusate sodium (COLACE) 100 MG capsule Take 100 mg by mouth 2 (two) times daily.        Marland Kitchen ezetimibe-simvastatin (VYTORIN) 10-40 MG per tablet Take 1 tablet by mouth daily.        Marland Kitchen levothyroxine (SYNTHROID, LEVOTHROID) 50 MCG tablet Take 50 mcg by mouth daily.        . Multiple Vitamin (MULTIVITAMIN) tablet Take 1 tablet by mouth daily.        . Tamsulosin HCl (FLOMAX) 0.4 MG CAPS Take 0.4 mg by mouth daily.        . TRAVATAN Z 0.004 % ophthalmic solution       . warfarin (COUMADIN) 3 MG tablet Take 3 mg by mouth as directed.        Marland Kitchen atorvastatin (LIPITOR) 80 MG tablet Take 1 tablet (80 mg total) by mouth daily.  90 tablet  3    BP 126/60  Pulse 70  Ht 5\' 11"  (1.803  m)  Wt 152 lb (68.947 kg)  BMI 21.20 kg/m2 General: NAD Neck: No JVD, no thyromegaly or thyroid nodule.  Lungs: Clear to auscultation bilaterally with normal respiratory effort. CV: Nondisplaced PMI.  Heart regular S1/S2, mechanical S2, no S3/S4, 1/6 SEM.  No peripheral edema.  No carotid bruit. 2+ PT bilaterally.  Abdomen: Soft, nontender, no hepatosplenomegaly, no distention.  Neurologic: Alert and oriented x 3.  Psych: Normal affect. Extremities: No clubbing or cyanosis.

## 2011-06-07 NOTE — Assessment & Plan Note (Addendum)
St. Jude mechanical aortic valve replacement in 1985.   Symptomatically, he is doing well with no CHF symptoms.  He has been tolerating ASA 81 mg daily and coumadin with no problems since ASA was restarted in 2/12 after CVA.  He does have history of subdural hematoma in 2/12 while on coumadin.  Given history of stroke, he should be bridged with heparin gtt if he needs to come off coumadin at some point in the future.  I will get an echocardiogram to reassess the valve since it has been a number of years since this was done.

## 2011-06-07 NOTE — Assessment & Plan Note (Signed)
He is going to see Dr. Modesto Charon in 8/12.  I will ask for a copy of lipids/LFTs/BMET.

## 2011-06-07 NOTE — Patient Instructions (Signed)
Schedule an appointment for an echocardiogram.  Your physician wants you to follow-up in: 6 months with Dr McLean.(January 2013). You will receive a reminder letter in the mail two months in advance. If you don't receive a letter, please call our office to schedule the follow-up appointment.  

## 2011-06-08 ENCOUNTER — Other Ambulatory Visit: Payer: Self-pay | Admitting: *Deleted

## 2011-06-19 ENCOUNTER — Ambulatory Visit (HOSPITAL_COMMUNITY): Payer: Medicare PPO | Attending: Cardiology | Admitting: Radiology

## 2011-06-19 DIAGNOSIS — Z954 Presence of other heart-valve replacement: Secondary | ICD-10-CM | POA: Insufficient documentation

## 2011-06-19 DIAGNOSIS — E785 Hyperlipidemia, unspecified: Secondary | ICD-10-CM | POA: Insufficient documentation

## 2011-06-19 DIAGNOSIS — Z86718 Personal history of other venous thrombosis and embolism: Secondary | ICD-10-CM | POA: Insufficient documentation

## 2011-06-19 DIAGNOSIS — I2510 Atherosclerotic heart disease of native coronary artery without angina pectoris: Secondary | ICD-10-CM | POA: Insufficient documentation

## 2011-06-19 DIAGNOSIS — Z86711 Personal history of pulmonary embolism: Secondary | ICD-10-CM | POA: Insufficient documentation

## 2011-06-19 DIAGNOSIS — I059 Rheumatic mitral valve disease, unspecified: Secondary | ICD-10-CM | POA: Insufficient documentation

## 2011-06-19 DIAGNOSIS — Z8673 Personal history of transient ischemic attack (TIA), and cerebral infarction without residual deficits: Secondary | ICD-10-CM | POA: Insufficient documentation

## 2011-06-19 DIAGNOSIS — I359 Nonrheumatic aortic valve disorder, unspecified: Secondary | ICD-10-CM

## 2011-06-19 DIAGNOSIS — Z952 Presence of prosthetic heart valve: Secondary | ICD-10-CM

## 2011-06-21 ENCOUNTER — Encounter: Payer: Self-pay | Admitting: Cardiology

## 2011-06-23 ENCOUNTER — Telehealth: Payer: Self-pay | Admitting: *Deleted

## 2011-06-23 NOTE — Telephone Encounter (Signed)
Pt aware of lab results. Reviewed reducing potassium rich foods. Mylo Red RN

## 2011-09-05 ENCOUNTER — Ambulatory Visit: Payer: Self-pay | Admitting: Cardiology

## 2011-09-05 DIAGNOSIS — Z952 Presence of prosthetic heart valve: Secondary | ICD-10-CM

## 2011-09-05 DIAGNOSIS — I82409 Acute embolism and thrombosis of unspecified deep veins of unspecified lower extremity: Secondary | ICD-10-CM

## 2011-09-05 DIAGNOSIS — I359 Nonrheumatic aortic valve disorder, unspecified: Secondary | ICD-10-CM

## 2011-09-05 DIAGNOSIS — Z7901 Long term (current) use of anticoagulants: Secondary | ICD-10-CM

## 2012-02-08 ENCOUNTER — Ambulatory Visit (INDEPENDENT_AMBULATORY_CARE_PROVIDER_SITE_OTHER): Payer: Medicare PPO | Admitting: Cardiology

## 2012-02-08 ENCOUNTER — Encounter: Payer: Self-pay | Admitting: Cardiology

## 2012-02-08 VITALS — BP 110/65 | HR 66 | Ht 71.0 in | Wt 160.0 lb

## 2012-02-08 DIAGNOSIS — Z954 Presence of other heart-valve replacement: Secondary | ICD-10-CM

## 2012-02-08 DIAGNOSIS — E785 Hyperlipidemia, unspecified: Secondary | ICD-10-CM

## 2012-02-08 DIAGNOSIS — Z952 Presence of prosthetic heart valve: Secondary | ICD-10-CM

## 2012-02-08 NOTE — Patient Instructions (Addendum)
Please ask your doctor to fax a copy of your lab results to Dr Shirlee Latch. [phone]  Your physician wants you to follow-up in: 1 year with Dr Shirlee Latch. You will receive a reminder letter in the mail two months in advance. If you don't receive a letter, please call our office to schedule the follow-up appointment.

## 2012-02-09 NOTE — Progress Notes (Signed)
PCP: Dr. Modesto Charon  78 yo with history of mechanical aortic valve replacement, subdural hematoma requiring craniotomy in 2004, and small CVA in 2/12 presents for cardiology followup.  In general, he does well symptomatically and is very active.  He works in his garden with no problems.  No exertional dyspnea or chest pain. He had a small CVA in 2/12 with a transient facial droop.  He had not been taking ASA since his subdural hematoma in 2004 but had been on coumadin.  After the CVA, he resumed ASA 81 mg daily.  Echo in 8/12 showed well-seated mechanical aortic valve with normal function.   PMH: 1. Hypothyroidism 2. Cholecystectomy 2005 3. St. Jude mechanical aortic valve replacement in 1985 for aortic stenosis.  Echo (8/12): EF 50-55%, mild LVH, mechanical aortic valve with mild AI and mean gradient 14 mmHg, mild to moderate MR.  4. DVT/PE (post-operative).  Has IVC filter.  5. Subdural hematoma requiring craniotomy in 2004.  6. Hyperlipidemia 7. Left heart cath (5/01): mild nonobstructive CAD 8. CVA: 2 /12, small with no residual effects.  9. Left shoulder surgery  SH: Married [name]), lives outside [place], nonsmoker.   FH: No premature CAD.   Current Outpatient Prescriptions  Medication Sig Dispense Refill  . aspirin EC 81 MG tablet Take 1 tablet (81 mg total) by mouth daily.      Marland Kitchen docusate sodium (COLACE) 100 MG capsule Take 100 mg by mouth 2 (two) times daily.        Marland Kitchen ezetimibe-simvastatin (VYTORIN) 10-40 MG per tablet Take 1 tablet by mouth daily.        Marland Kitchen levothyroxine (SYNTHROID, LEVOTHROID) 50 MCG tablet Take 50 mcg by mouth daily.        . Multiple Vitamin (MULTIVITAMIN) tablet Take 1 tablet by mouth daily.        . Tamsulosin HCl (FLOMAX) 0.4 MG CAPS Take 0.4 mg by mouth daily.        . TRAVATAN Z 0.004 % ophthalmic solution       . warfarin (COUMADIN) 3 MG tablet Take 3 mg by mouth as directed.          BP 110/65  Pulse 66  Ht 5\' 11"  (1.803 m)  Wt 160 lb  (72.576 kg)  BMI 22.32 kg/m2 General: NAD Neck: No JVD, no thyromegaly or thyroid nodule.  Lungs: Clear to auscultation bilaterally with normal respiratory effort. CV: Nondisplaced PMI.  Heart regular S1/S2, mechanical S2, no S3/S4, 1/6 systolic murmur LLSB.  No peripheral edema.  No carotid bruit. 2+ PT bilaterally.  Abdomen: Soft, nontender, no hepatosplenomegaly, no distention.  Neurologic: Alert and oriented x 3.  Psych: Normal affect. Extremities: No clubbing or cyanosis.

## 2012-02-09 NOTE — Assessment & Plan Note (Signed)
Patient will ask PCP to send lipids and BMET copy to me next time they are checked.

## 2012-02-09 NOTE — Assessment & Plan Note (Signed)
St. Jude mechanical aortic valve replacement in 1985.   Symptomatically, he is doing well with no CHF symptoms.  He has been tolerating ASA 81 mg daily and coumadin with no problems since ASA was restarted in 2/12 after CVA.  He does have history of subdural hematoma in 2/12 while on coumadin.  Given history of stroke, he should be bridged with heparin gtt if he needs to come off coumadin at some point in the future.  Last echo showed normal functioning valve.

## 2012-02-15 ENCOUNTER — Ambulatory Visit: Payer: Medicare PPO | Admitting: Cardiology

## 2012-06-18 ENCOUNTER — Ambulatory Visit (HOSPITAL_COMMUNITY)
Admission: RE | Admit: 2012-06-18 | Discharge: 2012-06-18 | Disposition: A | Discharge status: Home / Self Care | Payer: Medicare Other | Source: Ambulatory Visit | Attending: Family Medicine | Admitting: Family Medicine

## 2012-06-18 ENCOUNTER — Other Ambulatory Visit: Payer: Self-pay | Admitting: Family Medicine

## 2012-06-18 DIAGNOSIS — R55 Syncope and collapse: Secondary | ICD-10-CM

## 2012-06-18 DIAGNOSIS — I639 Cerebral infarction, unspecified: Secondary | ICD-10-CM

## 2012-06-18 DIAGNOSIS — R42 Dizziness and giddiness: Secondary | ICD-10-CM | POA: Insufficient documentation

## 2012-06-18 DIAGNOSIS — I6350 Cerebral infarction due to unspecified occlusion or stenosis of unspecified cerebral artery: Secondary | ICD-10-CM | POA: Insufficient documentation

## 2012-06-27 ENCOUNTER — Encounter: Payer: Self-pay | Admitting: Physician Assistant

## 2012-06-27 ENCOUNTER — Ambulatory Visit (INDEPENDENT_AMBULATORY_CARE_PROVIDER_SITE_OTHER): Payer: Medicare Other | Admitting: Physician Assistant

## 2012-06-27 VITALS — BP 142/71 | HR 77 | Ht 71.0 in | Wt 151.0 lb

## 2012-06-27 DIAGNOSIS — I2510 Atherosclerotic heart disease of native coronary artery without angina pectoris: Secondary | ICD-10-CM

## 2012-06-27 DIAGNOSIS — R55 Syncope and collapse: Secondary | ICD-10-CM

## 2012-06-27 DIAGNOSIS — I359 Nonrheumatic aortic valve disorder, unspecified: Secondary | ICD-10-CM

## 2012-06-27 DIAGNOSIS — I82409 Acute embolism and thrombosis of unspecified deep veins of unspecified lower extremity: Secondary | ICD-10-CM

## 2012-06-27 NOTE — Progress Notes (Signed)
[address] [place], [place]  [address] Phone: [phone] Fax:  [phone]  Date:  06/27/2012   Name:  [name]   DOB:  18-May-1928   MRN:  [id number]  PCP:  Redmond Baseman, MD  Primary Cardiologist:  Dr. Marca Ancona  Primary Electrophysiologist:  None    History of Present Illness: [name] is a 78 y.o. male who returns for evaluation of syncope.  Previously followed by Dr. Juanda Chance.  He has a h/o aortic stenosis, s/p St Jude AVR in 1985, prior subdural hematoma requiring craniotomy in 2004, small stroke in 2/12.  Last echo in 8/12 with normal fxn AVR and good LVF.  Last seen by Dr. Marca Ancona 3/13 with plans for one year follow up.  He notes a hx of episodic dizziness.  Two weeks ago, while sitting at the table he experienced a syncopal episode shortly after eating breakfast.  He had no warning.  Wife witnessed this.  He was unconscious for a few mins.  She did not check his pulse.  His breathing may have been labored.  EMS was summoned.  His sugar, BP and ECG were all reportedly normal.  He does tell me his BP dropped "30 points" lying to standing.  He saw his PCP.  MRI of his brain was ordered and demonstrated no acute CVA.  Labs done at PCP office 06/17/12: K 4.2, creatinine 1.0, Hgb 13.8, TSH 6.244.  Synthroid was adjusted.  He had one other episode of near syncope yesterday.  Otherwise he has had lightheaded episodes and seems to describe a spinning sensation assoc with it.  He notes a discomfort in his occipital area and then nausea. No vomiting.  Denies chest pain.  Push mows his yard.  No dyspnea.  No orthopnea, PND, edema.     Wt Readings from Last 3 Encounters:  06/27/12 151 lb (68.493 kg)  02/08/12 160 lb (72.576 kg)  06/07/11 152 lb (68.947 kg)     Past Medical History  Diagnosis Date  . Aortic stenosis     St.Jude AVR in 1985; Echo 8/12: EF 50-55%, mild LVH, mech AV with mild AI, mean gradient 14 mmHg, mild to mod MR    . CAD (coronary artery disease)     Mild, nonobstructive at catheterization 5/01  . Personal history of subdural hematoma     Requiring craniotomy  . History of pulmonary embolism     post op DVT/PE;  s/p IVC filter  . Hyperlipidemia   . Hypothyroidism   . S/P cholecystectomy 2005  . CVA (cerebral infarction) 12/2010  . S/P shoulder surgery     left    Current Outpatient Prescriptions  Medication Sig Dispense Refill  . aspirin EC 81 MG tablet Take 1 tablet (81 mg total) by mouth daily.      Marland Kitchen docusate sodium (COLACE) 100 MG capsule Take 100 mg by mouth 2 (two) times daily.        Marland Kitchen ezetimibe-simvastatin (VYTORIN) 10-40 MG per tablet Take 1 tablet by mouth daily.        Marland Kitchen latanoprost (XALATAN) 0.005 % ophthalmic solution Place 1 drop into the right eye at bedtime.      Marland Kitchen levothyroxine (SYNTHROID, LEVOTHROID) 50 MCG tablet Take 50 mcg by mouth daily.        . Multiple Vitamin (MULTIVITAMIN) tablet Take 1 tablet by mouth daily.        . Tamsulosin HCl (FLOMAX) 0.4 MG CAPS Take 0.4  mg by mouth daily.        Marland Kitchen warfarin (COUMADIN) 3 MG tablet Take 3 mg by mouth as directed.          Allergies: Allergies  Allergen Reactions  . Sulfonamide Derivatives     History  Substance Use Topics  . Smoking status: Never Smoker   . Smokeless tobacco: Not on file  . Alcohol Use: No     ROS:  Please see the history of present illness.   No fevers, chills, cough, melena, hematochezia, hematuria, diarrhea, arthralgias, rashes.  No long trips.  All other systems reviewed and negative.   PHYSICAL EXAM: VS:  BP 124/64  Pulse 81  Ht 5\' 11"  (1.803 m)  Wt 151 lb (68.493 kg)  BMI 21.06 kg/m2  Filed Vitals:   06/27/12 1632 06/27/12 1639 06/27/12 1640 06/27/12 1641  BP: 119/68 129/67 141/73 142/71  Pulse: 73 80 78 77  Height:      Weight:         Well nourished, well developed, in no acute distress HEENT: normal Neck: no JVD Vascular: no carotid bruits Endo: no thyromegaly Cardiac:   normal S1, mechanical S2; RRR; 1/6 systolic murmur along LSB Lungs:  clear to auscultation bilaterally, no wheezing, rhonchi or rales Abd: soft, nontender, no hepatomegaly Ext: no edema Skin: warm and dry Neuro:  CNs 2-12 intact, no focal abnormalities noted Psych: normal affect  EKG:  NSR, HR 81, LAD, in RBBB, inf-lat TW inversions (more prominent than prior tracing)  ASSESSMENT AND PLAN:  1. Syncope Etiology not clear.  Will check orthostatic vital signs. Echo done last one year ago.  Will repeat to make sure valve remains stable. ECG looks somewhat different.  But no chest pain or dyspnea.  Will arrange ETT-Myoview. Check carotid dopplers.   Arrange 21 day event monitor. No driving for now. Follow up with Dr. Marca Ancona in 4 weeks. Go to ED if syncope recurs.  2.  Aortic Stenosis, s/p Mechanical AVR Last echo demonstrated normal fxn valve. With recent syncopal episode, arrange follow up echo.  3. Non-Obstructive CAD  Cath done in 2001.   I do not see a functional study since then.  Will arrange ETT-Myoview.  4. History of Pulmonary Embolism Recent INRs therapeutic per patient. Plus he has an IVC filter.  O2 sats normal. Doubt acute pulmonary embolism is cause of syncope.  5. Dizziness His orthostatic VS demonstrate a drop in his BP from lying to sitting (137/76 => 119/68) with no significant HR increase.  Then, his BPs return to baseline standing (142/71).  HR 74=>73=>80=>78=>77.  Do not think that orthostasis explains his symptoms.  Has some vertiginous sounding symptoms as well.  But this would not cause syncope.  If cardiac workup negative, refer back to neurology.    Luna Glasgow, PA-C  4:00 PM 06/27/2012

## 2012-06-27 NOTE — Patient Instructions (Addendum)
Your physician recommends that you schedule a follow-up appointment in: APPROX 4 WEEKS WITH DR. [facility]  Your physician has requested that you have en exercise stress myoview. For further information please visit [email or web address] Please follow instruction sheet, as given. DX SYNCOPE  Your physician has recommended that you wear an event monitor. Event monitors are medical devices that record the heart's electrical activity. Doctors most often [place] these monitors to diagnose arrhythmias. Arrhythmias are problems with the speed or rhythm of the heartbeat. The monitor is a small, portable device. You can wear one while you do your normal daily activities. This is usually used to diagnose what is causing palpitations/syncope (passing out). DX SYNCOPE  Your physician has requested that you have a carotid duplex. This test is an ultrasound of the carotid arteries in your neck. It looks at blood flow through these arteries that supply the brain with blood. Allow one hour for this exam. There are no restrictions or special instructions. DX SYNCOPE  Your physician has requested that you have an echocardiogram. Echocardiography is a painless test that uses sound waves to create images of your heart. It provides your doctor with information about the size and shape of your heart and how well your heart's chambers and valves are working. This procedure takes approximately one hour. There are no restrictions for this procedure. DX SYNCOPE  NO DRIVING UNTIL FURTHER ADVISED BY CARDIOLOGY

## 2012-06-28 ENCOUNTER — Ambulatory Visit (HOSPITAL_COMMUNITY): Payer: Medicare Other | Attending: Internal Medicine

## 2012-06-28 DIAGNOSIS — R55 Syncope and collapse: Secondary | ICD-10-CM

## 2012-06-28 DIAGNOSIS — I079 Rheumatic tricuspid valve disease, unspecified: Secondary | ICD-10-CM | POA: Insufficient documentation

## 2012-06-28 DIAGNOSIS — E785 Hyperlipidemia, unspecified: Secondary | ICD-10-CM | POA: Insufficient documentation

## 2012-06-28 DIAGNOSIS — I059 Rheumatic mitral valve disease, unspecified: Secondary | ICD-10-CM | POA: Insufficient documentation

## 2012-06-28 DIAGNOSIS — I2510 Atherosclerotic heart disease of native coronary artery without angina pectoris: Secondary | ICD-10-CM | POA: Insufficient documentation

## 2012-06-28 DIAGNOSIS — I10 Essential (primary) hypertension: Secondary | ICD-10-CM | POA: Insufficient documentation

## 2012-06-28 NOTE — Progress Notes (Signed)
Echocardiogram performed.  

## 2012-07-01 ENCOUNTER — Ambulatory Visit: Payer: Medicare Other | Admitting: Cardiology

## 2012-07-02 ENCOUNTER — Ambulatory Visit (HOSPITAL_COMMUNITY): Payer: Medicare Other | Attending: Cardiology | Admitting: Radiology

## 2012-07-02 VITALS — BP 132/63 | HR 69 | Ht 71.0 in | Wt 148.0 lb

## 2012-07-02 DIAGNOSIS — R9431 Abnormal electrocardiogram [ECG] [EKG]: Secondary | ICD-10-CM

## 2012-07-02 DIAGNOSIS — R0609 Other forms of dyspnea: Secondary | ICD-10-CM | POA: Insufficient documentation

## 2012-07-02 DIAGNOSIS — I2510 Atherosclerotic heart disease of native coronary artery without angina pectoris: Secondary | ICD-10-CM | POA: Insufficient documentation

## 2012-07-02 DIAGNOSIS — R0989 Other specified symptoms and signs involving the circulatory and respiratory systems: Secondary | ICD-10-CM | POA: Insufficient documentation

## 2012-07-02 DIAGNOSIS — R002 Palpitations: Secondary | ICD-10-CM | POA: Insufficient documentation

## 2012-07-02 DIAGNOSIS — I10 Essential (primary) hypertension: Secondary | ICD-10-CM | POA: Insufficient documentation

## 2012-07-02 DIAGNOSIS — R55 Syncope and collapse: Secondary | ICD-10-CM | POA: Insufficient documentation

## 2012-07-02 DIAGNOSIS — R42 Dizziness and giddiness: Secondary | ICD-10-CM | POA: Insufficient documentation

## 2012-07-02 DIAGNOSIS — R61 Generalized hyperhidrosis: Secondary | ICD-10-CM | POA: Insufficient documentation

## 2012-07-02 MED ORDER — TECHNETIUM TC 99M TETROFOSMIN IV KIT
30.0000 | PACK | Freq: Once | INTRAVENOUS | Status: AC | PRN
  Administered 2012-07-02: 30 via INTRAVENOUS

## 2012-07-02 MED ORDER — TECHNETIUM TC 99M TETROFOSMIN IV KIT
10.0000 | PACK | Freq: Once | INTRAVENOUS | Status: AC | PRN
  Administered 2012-07-02: 10 via INTRAVENOUS

## 2012-07-02 NOTE — Progress Notes (Addendum)
[facility] SITE 3 NUCLEAR MED [address] [place] [address] [phone]  Cardiology Nuclear Med Study  BATES COLLINGTON is a 78 y.o. male     MRN : [id number]     DOB: 21-Nov-1927  Procedure Date: 07/02/2012  Nuclear Med Background Indication for Stress Test:  Evaluation for Ischemia and New EKG Changes History:  '85 AVR; '01 Cath:Mild n/o CAD; 06/28/12 EF=60-65%, mild AI, mild LVH. Cardiac Risk Factors: CVA, Family History - CAD, Hypertension, Lipids and RBBB.  Symptoms:  Doe, Palpitations, Diaphoresis, Dizziness, DOE and Syncope about 2 weeks ago.   Nuclear Pre-Procedure Caffeine/Decaff Intake:  None NPO After: 7:00pm   Lungs:  Clear. O2 Sat: 97% on room air. IV 0.9% NS with Angio Cath:  22g  IV Site: R Hand  IV Started by:  Cathlyn Parsons, RN  Chest Size (in):  40 Cup Size: n/a  Height: 5\' 11"  (1.803 m)  Weight:  148 lb (67.132 kg)  BMI:  Body mass index is 20.64 kg/(m^2). Tech Comments:  n/a    Nuclear Med Study 1 or 2 day study: 1 day  Stress Test Type:  Stress  Reading MD: Willa Rough, MD  Order Authorizing Provider:  Marca Ancona, MD and [name], MD  Resting Radionuclide: Technetium 32m Tetrofosmin  Resting Radionuclide Dose: 10.6 mCi   Stress Radionuclide:  Technetium 50m Tetrofosmin  Stress Radionuclide Dose: 32.6 mCi           Stress Protocol Rest HR: 69 Stress HR: 120  Rest BP: 132/63 Stress BP: 163/68  Exercise Time (min): 6:00 METS: 7.0   Predicted Max HR: 139 bpm % Max HR: 86.33 bpm Rate Pressure Product: 53664   Dose of Adenosine (mg):  n/a Dose of Lexiscan: n/a mg  Dose of Atropine (mg): n/a Dose of Dobutamine: n/a mcg/kg/min (at max HR)  Stress Test Technologist: Smiley Houseman, CMA-N  Nuclear Technologist:  Domenic Polite, CNMT     Rest Procedure:  Myocardial perfusion imaging was performed at rest 45 minutes following the intravenous administration of Technetium 6m Tetrofosmin.  Rest ECG: Nonspecific ST-T  wave changes with occasional PAC.  Stress Procedure:  The patient exercised on the treadmill utilizing the Bruce protocol for six minutes. He then stopped due to fatigue and denied any chest pain.  There were no diagnostic ST-T wave changes, occasional PAC's and PVC's were noted.  Technetium 79m Tetrofosmin was injected at peak exercise and myocardial perfusion imaging was performed after a brief delay.  Stress ECG: No significant change from baseline ECG and No significant ST segment change suggestive of ischemia.  QPS Raw Data Images:  Normal; no motion artifact; normal heart/lung ratio. Stress Images:  There is a small area of decreased uptake in the inferior wall. The degree of photon reduction is mild Rest Images:  The rest images are the same as the stress images Subtraction (SDS):  No evidence of ischemia. Transient Ischemic Dilatation (Normal <1.22):  0.94 Lung/Heart Ratio (Normal <0.45):  0.31  Quantitative Gated Spect Images QGS EDV:  109 ml QGS ESV:  51 ml  Impression Exercise Capacity:  This is fairly good exercise tolerance for this 78 year old patient BP Response:  Normal blood pressure response. Clinical Symptoms:  shortness of breath ECG Impression:  No significant ST segment change suggestive of ischemia. Comparison with Prior Nuclear Study: No images to compare  Overall Impression:  Normal stress nuclear study. There is some decreased activity in the inferior wall. This is probably due to  attenuation.  LV Ejection Fraction: 54%.  LV Wall Motion:  Normal Wall Motion.  Willa Rough, MD   No evidence for ischemia or infarction.  EF near normal. Please tell patient.   Marca Ancona 07/03/2012 10:56 AM

## 2012-07-04 ENCOUNTER — Encounter (INDEPENDENT_AMBULATORY_CARE_PROVIDER_SITE_OTHER): Payer: Medicare Other

## 2012-07-04 DIAGNOSIS — R55 Syncope and collapse: Secondary | ICD-10-CM

## 2012-07-04 DIAGNOSIS — I6529 Occlusion and stenosis of unspecified carotid artery: Secondary | ICD-10-CM

## 2012-07-04 NOTE — Progress Notes (Signed)
Pt.notified

## 2012-07-07 ENCOUNTER — Encounter: Payer: Self-pay | Admitting: Physician Assistant

## 2012-07-08 ENCOUNTER — Telehealth: Payer: Self-pay | Admitting: *Deleted

## 2012-07-08 NOTE — Telephone Encounter (Signed)
pt's wife notified of echo results and gave verbal understanding to results today

## 2012-07-08 NOTE — Telephone Encounter (Signed)
Message copied by Tarri Fuller on Mon Jul 08, 2012  3:43 PM ------      Message from: [place], [place] T      Created: Sun Jul 07, 2012  6:24 AM       Normal LV function.      AV prosthesis ok.      [name], PA-C  6:24 AM 07/07/2012

## 2012-07-09 ENCOUNTER — Telehealth: Payer: Self-pay | Admitting: *Deleted

## 2012-07-09 ENCOUNTER — Encounter: Payer: Self-pay | Admitting: Physician Assistant

## 2012-07-09 NOTE — Telephone Encounter (Signed)
pt notified about carotid dopplers and gave me verbal understanding today

## 2012-07-09 NOTE — Telephone Encounter (Signed)
Message copied by Tarri Fuller on Tue Jul 09, 2012  3:26 PM ------      Message from: [place], [place] T      Created: Tue Jul 09, 2012  2:00 PM       RICA 40-59%      LICA 0-39%      Mild plaque buildup      Follow up carotid dopplers in one year.      [name], PA-C  2:00 PM 07/09/2012

## 2012-07-10 ENCOUNTER — Telehealth: Payer: Self-pay | Admitting: Cardiology

## 2012-07-10 NOTE — Telephone Encounter (Signed)
I received a call from [facility] regarding a 3.5 second pause regarding Mr. Raether at 7:00.  I spoke with Dr. Shirlee Latch who is aware of this, and is continuing to monitor.

## 2012-07-24 ENCOUNTER — Ambulatory Visit (INDEPENDENT_AMBULATORY_CARE_PROVIDER_SITE_OTHER): Payer: Medicare Other | Admitting: Cardiology

## 2012-07-24 ENCOUNTER — Encounter: Payer: Self-pay | Admitting: Cardiology

## 2012-07-24 VITALS — BP 128/62 | HR 71 | Ht 71.0 in | Wt 151.0 lb

## 2012-07-24 DIAGNOSIS — I359 Nonrheumatic aortic valve disorder, unspecified: Secondary | ICD-10-CM

## 2012-07-24 DIAGNOSIS — R55 Syncope and collapse: Secondary | ICD-10-CM

## 2012-07-24 DIAGNOSIS — I441 Atrioventricular block, second degree: Secondary | ICD-10-CM

## 2012-07-24 NOTE — Patient Instructions (Addendum)
You have been referred to EP for pacemaker evaluation. Dr Graciela Husbands, Dr Johney Frame, or Dr Ladona Ridgel.  Your physician has recommended that you have a sleep study. This test records several body functions during sleep, including: brain activity, eye movement, oxygen and carbon dioxide blood levels, heart rate and rhythm, breathing rate and rhythm, the flow of air through your mouth and nose, snoring, body muscle movements, and chest and belly movement.  Your physician recommends that you schedule a follow-up appointment in: 2 months with Dr Shirlee Latch.

## 2012-07-24 NOTE — Progress Notes (Signed)
Patient ID: [name], male   DOB: August 25, 1928, 78 y.o.   MRN: [id number] PCP: Dr. Modesto [name]  78 yo with history of mechanical aortic valve replacement, subdural hematoma requiring craniotomy in 2004, and small CVA in 2/12 presents for cardiology followup.  In general, he does well symptomatically and is very active.  He works in his garden with no problems.  No exertional dyspnea or chest pain. He had a small CVA in 2/12 with a transient facial droop.  He had not been taking ASA since his subdural hematoma in 2004 but had been on coumadin.  After the CVA, he resumed ASA 81 mg daily.    He has had spells of "dizziness" for many years.  These are periodic, typically once a month or less.  He will get lightheaded and diaphoretic and nauseated.  He will not fully pass out and the symptoms resolve quickly.  However, in 8/13, he was sitting at the kitchen table with his wife.  He had finished breakfast.  His wife says that his head fell onto the table without warning.  He was unconscious for a few minutes.  He was not orthostatic when checked by EMS.  No acute changes on ECG.  He had an MRI of his head that did not show a CVA.  He later followed up in our office with [name].  He had a myoview that showed no ischemia and an echo showing normal EF and a well-seated mechanical aortic valve.  He received a 30-day event monitor.  Since the syncopal event, he had one other episode in [name] where he got extremely lightheaded and almost passed out (before he started wearing the event monitor).    The event monitor has showed episodes of Type I 2nd degree and 2:1 heart block.  This typically occurs at night.  There are also episodes of higher degree heart block (several Ps in a row with no QRSs with up to 3.5 second pauses).  These episodes also tend to occur late at night while patient is presumably asleep.    Labs (8/13): K 4.2, creatinine 1.0, TSH 6.44 (Synthroid adjusted)   PMH: 1. Hypothyroidism 2.  Cholecystectomy 2005 3. St. Jude mechanical aortic valve replacement in 1985 for aortic stenosis.  Echo (8/12): EF 50-55%, mild LVH, mechanical aortic valve with mild AI and mean gradient 14 mmHg, mild to moderate MR. Echo (8/13): EF 60-65%, mechanical aortic valve is well-seated with mean gradient 19 mmHg. 4. DVT/PE (post-operative).  Has IVC filter.  5. Subdural hematoma requiring craniotomy in 2004.  6. Hyperlipidemia 7. Left heart cath (5/01): mild nonobstructive CAD 8. CVA: 2 /12, small with no residual effects.  9. Left shoulder surgery 10. Syncope (8/13): ETT-myoview was done with no ischemia or infarction, EF 54%.  Echo unremarkable, see above.  Carotid dopplers with 40-59% RICA stenosis.  30 day event monitor with episodes of Type I 2nd degree and 2:1 heart block.  The monitor also showed higher-grade heart block with several nonconducted Ps in a row, up to 3.5 second pauses.  Most arrhythmias took place at night.   SH: Married [name]), lives outside [place], nonsmoker.   FH: No premature CAD.   ROS: All systems reviewed and negative except as per HPI.   Current Outpatient Prescriptions  Medication Sig Dispense Refill  . aspirin EC 81 MG tablet Take 1 tablet (81 mg total) by mouth daily.      Marland Kitchen docusate sodium (COLACE) 100 MG capsule Take 100 mg  by mouth 2 (two) times daily.        Marland Kitchen ezetimibe-simvastatin (VYTORIN) 10-40 MG per tablet Take 1 tablet by mouth daily.        Marland Kitchen latanoprost (XALATAN) 0.005 % ophthalmic solution Place 1 drop into the right eye at bedtime.      Marland Kitchen levothyroxine (SYNTHROID, LEVOTHROID) 50 MCG tablet Take 50 mcg by mouth daily.        . Multiple Vitamin (MULTIVITAMIN) tablet Take 1 tablet by mouth daily.        . Tamsulosin HCl (FLOMAX) 0.4 MG CAPS Take 0.4 mg by mouth daily.        Marland Kitchen warfarin (COUMADIN) 3 MG tablet Take 3 mg by mouth as directed.          BP 128/62  Pulse 71  Ht 5\' 11"  (1.803 m)  Wt 151 lb (68.493 kg)  BMI 21.06 kg/m2   SpO2 96% General: NAD Neck: No JVD, no thyromegaly or thyroid nodule.  Lungs: Clear to auscultation bilaterally with normal respiratory effort. CV: Nondisplaced PMI.  Heart regular S1/S2, mechanical S2, no S3/S4, 1/6 systolic murmur LLSB.  No peripheral edema.  No carotid bruit. 2+ PT bilaterally.  Abdomen: Soft, nontender, no hepatosplenomegaly, no distention.  Neurologic: Alert and oriented x 3.  Psych: Normal affect. Extremities: No clubbing or cyanosis.   Assessment/Plan  1. Aortic valve replaced  St. Jude mechanical aortic valve replacement in 1985. Symptomatically, he is doing well with no CHF symptoms. He has been tolerating ASA 81 mg daily and coumadin with no problems since ASA was restarted in 2/12 after CVA. He does have history of subdural hematoma in 2004 while on coumadin. Given history of stroke, he should be bridged with heparin gtt if he needs to come off coumadin at some point in the future. Last echo showed normal functioning valve.  2. HYPERLIPIDEMIA  Patient will ask PCP to send lipids and BMET copy to me next time they are checked.  3. Syncope Description of syncopal event sounds arrhythmic.  He had one further symptomatically severe lightheaded episode after this but did not pass out. Echo and myoview were unremarkable.  The 30 day monitor showed Type I 2nd degree AVB and 2:1 AV block.  It also showed higher grade AV block with multiple sequential nonconducted Ps, creatinine pauses up to 3.5 seconds.  Most of the arrhythmias were noted at night.  - No driving.  - Sleep study to see if hypoxemia/OSA is contributing to nocturnal arrhythmias.   - I will refer Mr [name] to EP.  As above, syncopal episode was worrisome and arrhythmias were noted on monitor, though they were not significant enough to be expected to cause symptoms.  He will finish the monitor in a few days.  An implanted loop recorder may be the next step.    [name] [name]

## 2012-07-25 DIAGNOSIS — R55 Syncope and collapse: Secondary | ICD-10-CM | POA: Insufficient documentation

## 2012-07-29 ENCOUNTER — Encounter: Payer: Self-pay | Admitting: Internal Medicine

## 2012-07-29 ENCOUNTER — Telehealth: Payer: Self-pay | Admitting: Internal Medicine

## 2012-07-29 ENCOUNTER — Ambulatory Visit (INDEPENDENT_AMBULATORY_CARE_PROVIDER_SITE_OTHER): Payer: Medicare Other | Admitting: Internal Medicine

## 2012-07-29 VITALS — BP 116/70 | HR 72 | Ht 71.0 in | Wt 151.1 lb

## 2012-07-29 DIAGNOSIS — R55 Syncope and collapse: Secondary | ICD-10-CM

## 2012-07-29 DIAGNOSIS — G9001 Carotid sinus syncope: Secondary | ICD-10-CM

## 2012-07-29 NOTE — Telephone Encounter (Signed)
Pt son called Ismail Bester [phone]  [facility] the episode pt had last between 30 seconds, and 1 minute 45 seconds.  Loraine Leriche can be reached at the number listed above

## 2012-07-29 NOTE — Patient Instructions (Signed)
Call Dr.Klein back after you speak with your wife.

## 2012-07-29 NOTE — Progress Notes (Signed)
ELECTROPHYSIOLOGY CONSULT NOTE  Patient ID: [name], MRN: [id number], DOB/AGE: 76-May-1929 76 y.o. Admit date: (Not on file) Date of Consult: 07/29/2012  Primary Physician: Redmond Baseman, MD Primary Cardiologist: DM  Chief Complaint: sycope   HPI [name] is a 78 y.o. male  seen at the request of Dr. DM because of syncope.  He has a lifelong history (20 years) of spells characterized by a discomfort in the back of his head followed by spinning sensation and dizziness and nausea associated with profound diaphoresis and extreme pallor. These spells have become increasingly frequent and are now occurring a couple of times a day. Sometimes they are relatively brief lasting minutes other times that he can last hours. He describes himself with his bowels is becoming very weak and sometimes having to lie down. They abate on their own sometimes and sometimes an hour or 2.   He has had a couple of spells of syncope. The first occurred about 10 years ago. It he was walking and [facility]. He was resuscitated by having had his legs raised.. The episode that this evaluation occurred a few weeks ago. He had just finished eating breakfast consisting of eggs bacon grits etc. without warning he fell forward into his coffee. The duration of the episode has been estimated by his wife to be 2-3 minutes. She called 911. He was awake before they got there. Described as being pale. Talking to his mother, and counting out seconds, the son called back and said it was about 1-1/2 minutes or so.   His cardiac history is notable for mechanical aortic valve replacement. Echo August 2012 demonstrated normal left ventricular function. Catheterization 2001 demonstrated nonobstructive coronary disease. Stress test August 2013 demonstrated normal left ventricular function nonischemic Myoview.  He has a history of a prior stroke as well as subdermal hematoma requiring craniotomy 2004. He has  been treated with Coumadin. He also has a history of pulmonary embolism with prior IVC filter.  30 day monitor her demonstrated second-degree AV block at 2 to one block. And also show higher grade AV block with multiple sequential nonconductive P waves and pauses up to 3.5 seconds. These pauses were nocturnal. A sleep study was ordered.   Carotid Dopplers demonstrated 40-60% on the right and 0-39% on the last. He does not have significant dizziness driving or turning his head.  He denies exercise intolerance and is without chest pain or shortness of breath. He can walk "as far as I can "   Past Medical History  Diagnosis Date  . Aortic stenosis     St.Jude AVR in 1985; Echo 8/12: EF 50-55%, mild LVH, mech AV with mild AI, mean gradient 14 mmHg, mild to mod MR;  Echo 8/13: EF 60-65%, AVR ok with mean gradient 19 mmHg  . CAD (coronary artery disease)     Mild, nonobstructive at catheterization 5/01  . Personal history of subdural hematoma     Requiring craniotomy  . History of pulmonary embolism     post op DVT/PE;  s/p IVC filter  . Hyperlipidemia   . Hypothyroidism   . S/P cholecystectomy 2005  . CVA (cerebral infarction) 12/2010  . S/P shoulder surgery     left  . Carotid stenosis     Dopplers 8/13: RICA 40-50% and LICA 0-39% => followup 1 year      Surgical History:  Past Surgical History  Procedure Date  . Aortic valve replacement 1985    St. Jude  .  Cardiac catheterization      Home Meds: Prior to Admission medications   Medication Sig Start Date End Date Taking? Authorizing Provider  aspirin EC 81 MG tablet Take 1 tablet (81 mg total) by mouth daily. 06/08/11  Yes Laurey Morale, MD  docusate sodium (COLACE) 100 MG capsule Take 100 mg by mouth daily.    Yes Historical Provider, MD  ezetimibe-simvastatin (VYTORIN) 10-40 MG per tablet Take 1 tablet by mouth daily.     Yes Historical Provider, MD  latanoprost (XALATAN) 0.005 % ophthalmic solution Place 1 drop into the  right eye at bedtime. 05/01/12  Yes Historical Provider, MD  levothyroxine (SYNTHROID, LEVOTHROID) 50 MCG tablet Take 75 mcg by mouth daily.    Yes Historical Provider, MD  Multiple Vitamin (MULTIVITAMIN) tablet Take 1 tablet by mouth daily.     Yes Historical Provider, MD  Tamsulosin HCl (FLOMAX) 0.4 MG CAPS Take 0.4 mg by mouth 2 (two) times daily.    Yes Historical Provider, MD  warfarin (COUMADIN) 3 MG tablet Take 3 mg by mouth as directed.     Yes Historical Provider, MD      Allergies:  Allergies  Allergen Reactions  . Codeine   . Ivp Dye (Iodinated Diagnostic Agents)   . Sulfonamide Derivatives     History   Social History  . Marital Status: Married    Spouse Name: N/A    Number of Children: N/A  . Years of Education: N/A   Occupational History  . Retired    Social History Main Topics  . Smoking status: Never Smoker   . Smokeless tobacco: Not on file  . Alcohol Use: No  . Drug Use: No  . Sexually Active: Not on file   Other Topics Concern  . Not on file   Social History Narrative  . No narrative on file     Family History  Problem Relation Age of Onset  . Coronary artery disease Other      ROS:  Please see the history of present illness.    All other systems reviewed and negative.    Physical Exam:  Blood pressure 116/70, pulse 72, height 5\' 11"  (1.803 m), weight 151 lb 1.9 oz (68.548 kg). General: Well developed, well nourished male in no acute distress. Head: Normocephalic, atraumatic, sclera non-icteric, no xanthomas, nares are without discharge. Lymph Nodes:  none Back: without scoliosis/kyphosis, no CVA tendersness Neck: Negative for carotid bruits. JVD not elevated. Lungs: Clear bilaterally to auscultation without wheezes, rales, or rhonchi. Breathing is unlabored. Heart: RRR with S1 S mechanical S2 with a 2/6 systolic murmur , rubs, or gallops appreciated. Abdomen: Soft, non-tender, non-distended with normoactive bowel sounds. No hepatomegaly.  No rebound/guarding. No obvious abdominal masses. Msk:  Strength and tone appear normal for age. Extremities: No clubbing or cyanosis. Noedema.  Distal pedal pulses are 2+ and equal bilaterally. Skin: Warm and Dry Neuro: Alert and oriented X 3. CN III-XII intact Grossly normal sensory and motor function . Psych:  Responds to questions appropriately with a normal affect.      Labs: Cardiac Enzymes No results found for this basename: CKTOTAL:4,CKMB:4,TROPONINI:4 in the last 72 hours CBC Lab Results  Component Value Date   WBC 3.0* 05/19/2008   HGB 13.2 05/19/2008   HCT 37.4* 05/19/2008   MCV 96.1 05/19/2008   PLT 138* 05/19/2008   PROTIME: No results found for this basename: LABPROT:3,INR:3 in the last 72 hours Chemistry No results found for this basename: NA,K,CL,CO2,BUN,CREATININE,CALCIUM,LABALBU,PROT,BILITOT,ALKPHOS,ALT,AST,GLUCOSE in the  last 168 hours Lipids Lab Results  Component Value Date   CHOL 132 05/19/2008   HDL 48.2 05/19/2008   LDLCALC 73 05/19/2008   TRIG 54 05/19/2008   BNP No results found for this basename: probnp   Miscellaneous No results found for this basename: DDIMER    Radiology/Studies:  No results found.  EKG: sinus with normla intervals   Carotid SInus Massage: Left-sided massage was associated with 6  second pause     Sherryl Manges

## 2012-07-29 NOTE — Assessment & Plan Note (Addendum)
The patient had an abrupt onset episode of syncope the duration of which is not yet clear. An event recorder has demonstrated recurrent nocturnal pausing associated with high-grade heart block. No daytime episodes were noted. A sleep study has been ordered.  He has continued to have daytime episode of s he characterizes by a discomfort nausea diaphoresis I can last minutes or hours and be associated with fatigue. there were  multiple episodes occurred while he was going to monitor; and no daytime aarrhythmias were noted. It is not at all clear to me that he daytime spells of 20 years duration is related to the syncopal episode.  Furthermore, carotid sinus massage altough very impressive was associated with a somewhat different symptom complex. 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 We have discussed the role of pacing, and he is interested in proceeding that way as opposed with a loop recorder. That is a reasonable decision given the prolonged pause that was noted today. In addition, nocturnal pausing is of concern to him though it is not clear to him or to his family that obstructive sleep apnea is infact the explanation .   I have reviewed this issue with Dr. DM; given the abrupt nature of his syncope, impressive carotid sinus massage we will plan to recommend pacing. Orthostatic hypotension has not been demonstrable; cardiac function is normal making ventricular arrhythmias unlikely and with CLS pacing we may be able to help only with his cardio inhibition but also potentially with a vasomotor component.  The benefits and risks were reviewed including but not limited to death,  perforation, infection, lead dislodgement and device malfunction.  The patient understands agrees and is willing to proceed.

## 2012-07-29 NOTE — Telephone Encounter (Signed)
Pt's son called to let MD know about how long pt's episode lasted. Dr Graciela Husbands is aware that pt's episode lasted 1 minute 30 or 45 seconds.

## 2012-07-30 ENCOUNTER — Telehealth: Payer: Self-pay | Admitting: Cardiology

## 2012-07-30 NOTE — Telephone Encounter (Signed)
Patient  had an office visit yesterday 07/29/12 with Dr. Graciela Husbands. Pt states that Dr. Graciela Husbands  mentioned that pt needed to have a pacemake implant, and that Dr. Graciela Husbands will talk with Dr. Shirlee Latch about it, and to see if the procedure  can be done sometime this week. Pt would like to know if the two doctors have talk and if the procedure will be done this week.

## 2012-07-30 NOTE — Telephone Encounter (Signed)
plz return call to patient at hm#, concerning questions about getting a pace maker

## 2012-07-30 NOTE — Telephone Encounter (Signed)
Per Dr. Graciela Husbands, he has spoken with Dr. Shirlee Latch. He will proceed implantation of a CLS Biotronik device. I will contact the patient tomorrow to schedule.

## 2012-07-31 ENCOUNTER — Encounter: Payer: Self-pay | Admitting: *Deleted

## 2012-07-31 ENCOUNTER — Telehealth: Payer: Self-pay | Admitting: Internal Medicine

## 2012-07-31 ENCOUNTER — Encounter: Payer: Self-pay | Admitting: Internal Medicine

## 2012-07-31 ENCOUNTER — Other Ambulatory Visit: Payer: Self-pay | Admitting: Internal Medicine

## 2012-07-31 DIAGNOSIS — G9001 Carotid sinus syncope: Secondary | ICD-10-CM

## 2012-07-31 MED ORDER — GENTAMICIN SULFATE 40 MG/ML IJ SOLN
80.0000 mg | INTRAMUSCULAR | Status: DC
  Filled 2012-07-31: qty 2

## 2012-07-31 MED ORDER — DIPHENHYDRAMINE HCL 50 MG/ML IJ SOLN
25.0000 mg | INTRAMUSCULAR | Status: AC
  Administered 2012-08-01: 25 mg via INTRAVENOUS

## 2012-07-31 MED ORDER — PREDNISONE 20 MG PO TABS: ORAL_TABLET | ORAL | Status: DC

## 2012-07-31 MED ORDER — METHYLPREDNISOLONE SODIUM SUCC 125 MG IJ SOLR
125.0000 mg | INTRAMUSCULAR | Status: AC
  Administered 2012-08-01: 125 mg via INTRAVENOUS

## 2012-07-31 MED ORDER — FAMOTIDINE IN NACL 20-0.9 MG/50ML-% IV SOLN
20.0000 mg | INTRAVENOUS | Status: AC
  Administered 2012-08-01: 20 mg via INTRAVENOUS

## 2012-07-31 MED ORDER — CEFAZOLIN SODIUM-DEXTROSE 2-3 GM-% IV SOLR
2.0000 g | INTRAVENOUS | Status: DC
  Filled 2012-07-31: qty 50

## 2012-07-31 NOTE — Telephone Encounter (Signed)
I spoke with the patient today. He is scheduled for a PPM implant for 08/01/12 with Dr. Graciela Husbands. He is to arrive at 2:00 pm per the EP lab. Procedure set for 5:00pm but may be done earlier. He will go to [facility] for labs this afternoon.

## 2012-07-31 NOTE — Telephone Encounter (Signed)
When I spoke with the patient earlier today, [name] stated I did not need to call his son. We reviewed his allergy list and [name] does have an IVP allergy. Per Dr. Graciela Husbands we will pre-medicate for this. I have called in prednisone 20 mg to take three tabs tonight with benadryl 25 mg one tablet. The patient verbalizes understanding.

## 2012-07-31 NOTE — Telephone Encounter (Signed)
Pt calling re procedure he is having, said talked to a nurse about an hour ago and she was to call back, needs to leave so pls call back on cell number on file

## 2012-07-31 NOTE — Telephone Encounter (Signed)
F/u   Pt son [name] would like  Return call from nurse to ask questions upcoming procedure and the time he needs to be at the hospital.  Plz return call to [name] at [phone]

## 2012-08-01 ENCOUNTER — Encounter (HOSPITAL_COMMUNITY): Payer: Self-pay | Admitting: *Deleted

## 2012-08-01 ENCOUNTER — Encounter: Payer: Self-pay | Admitting: Internal Medicine

## 2012-08-01 ENCOUNTER — Encounter (HOSPITAL_COMMUNITY)
Admission: RE | Disposition: A | Discharge status: Home / Self Care | Payer: Self-pay | Source: Ambulatory Visit | Attending: Internal Medicine

## 2012-08-01 ENCOUNTER — Ambulatory Visit (HOSPITAL_COMMUNITY): Payer: Medicare Other

## 2012-08-01 ENCOUNTER — Ambulatory Visit (HOSPITAL_COMMUNITY)
Admission: RE | Admit: 2012-08-01 | Discharge: 2012-08-02 | Disposition: A | Discharge status: Home / Self Care | Payer: Medicare Other | Source: Ambulatory Visit | Attending: Internal Medicine | Admitting: Internal Medicine

## 2012-08-01 DIAGNOSIS — E785 Hyperlipidemia, unspecified: Secondary | ICD-10-CM | POA: Insufficient documentation

## 2012-08-01 DIAGNOSIS — I2510 Atherosclerotic heart disease of native coronary artery without angina pectoris: Secondary | ICD-10-CM | POA: Insufficient documentation

## 2012-08-01 DIAGNOSIS — Z7901 Long term (current) use of anticoagulants: Secondary | ICD-10-CM | POA: Insufficient documentation

## 2012-08-01 DIAGNOSIS — I6529 Occlusion and stenosis of unspecified carotid artery: Secondary | ICD-10-CM | POA: Insufficient documentation

## 2012-08-01 DIAGNOSIS — Z954 Presence of other heart-valve replacement: Secondary | ICD-10-CM | POA: Insufficient documentation

## 2012-08-01 DIAGNOSIS — Z86718 Personal history of other venous thrombosis and embolism: Secondary | ICD-10-CM | POA: Insufficient documentation

## 2012-08-01 DIAGNOSIS — R55 Syncope and collapse: Secondary | ICD-10-CM | POA: Insufficient documentation

## 2012-08-01 DIAGNOSIS — Z86711 Personal history of pulmonary embolism: Secondary | ICD-10-CM | POA: Insufficient documentation

## 2012-08-01 DIAGNOSIS — G9001 Carotid sinus syncope: Secondary | ICD-10-CM

## 2012-08-01 DIAGNOSIS — Z8673 Personal history of transient ischemic attack (TIA), and cerebral infarction without residual deficits: Secondary | ICD-10-CM | POA: Insufficient documentation

## 2012-08-01 HISTORY — PX: PERMANENT PACEMAKER INSERTION: SHX5480

## 2012-08-01 HISTORY — DX: Acute embolism and thrombosis of unspecified deep veins of unspecified lower extremity: I82.409

## 2012-08-01 SURGERY — PERMANENT PACEMAKER INSERTION
Anesthesia: LOCAL

## 2012-08-01 MED ORDER — ACETAMINOPHEN 325 MG PO TABS: 325.0000 mg | ORAL_TABLET | ORAL | Status: DC | PRN

## 2012-08-01 MED ORDER — LIDOCAINE HCL (PF) 1 % IJ SOLN
INTRAMUSCULAR | Status: AC
  Filled 2012-08-01: qty 60

## 2012-08-01 MED ORDER — LATANOPROST 0.005 % OP SOLN
1.0000 [drp] | Freq: Every day | OPHTHALMIC | Status: DC
  Administered 2012-08-01: 1 [drp] via OPHTHALMIC
  Filled 2012-08-01: qty 2.5

## 2012-08-01 MED ORDER — EZETIMIBE-SIMVASTATIN 10-40 MG PO TABS
1.0000 | ORAL_TABLET | Freq: Every day | ORAL | Status: DC
  Administered 2012-08-01 – 2012-08-02 (×2): 1 via ORAL
  Filled 2012-08-01 (×2): qty 1

## 2012-08-01 MED ORDER — DIPHENHYDRAMINE HCL 50 MG/ML IJ SOLN
INTRAMUSCULAR | Status: AC
  Administered 2012-08-01: 25 mg via INTRAVENOUS
  Filled 2012-08-01: qty 1

## 2012-08-01 MED ORDER — MUPIROCIN 2 % EX OINT
TOPICAL_OINTMENT | Freq: Two times a day (BID) | CUTANEOUS | Status: DC
  Administered 2012-08-01: 15:00:00 via NASAL
  Filled 2012-08-01: qty 22

## 2012-08-01 MED ORDER — SODIUM CHLORIDE 0.9 % IJ SOLN: 3.0000 mL | Freq: Two times a day (BID) | INTRAMUSCULAR | Status: DC

## 2012-08-01 MED ORDER — CEFAZOLIN SODIUM-DEXTROSE 2-3 GM-% IV SOLR
INTRAVENOUS | Status: AC
  Filled 2012-08-01: qty 50

## 2012-08-01 MED ORDER — WARFARIN SODIUM 3 MG PO TABS: 3.0000 mg | ORAL_TABLET | ORAL | Status: DC

## 2012-08-01 MED ORDER — MUPIROCIN 2 % EX OINT
TOPICAL_OINTMENT | CUTANEOUS | Status: AC
  Filled 2012-08-01: qty 22

## 2012-08-01 MED ORDER — CHLORHEXIDINE GLUCONATE 4 % EX LIQD: 60.0000 mL | Freq: Once | CUTANEOUS | Status: DC

## 2012-08-01 MED ORDER — TAMSULOSIN HCL 0.4 MG PO CAPS
0.4000 mg | ORAL_CAPSULE | Freq: Two times a day (BID) | ORAL | Status: DC
  Administered 2012-08-01 – 2012-08-02 (×2): 0.4 mg via ORAL
  Filled 2012-08-01 (×3): qty 1

## 2012-08-01 MED ORDER — WARFARIN - PHYSICIAN DOSING INPATIENT: Freq: Every day | Status: DC

## 2012-08-01 MED ORDER — METHYLPREDNISOLONE SODIUM SUCC 125 MG IJ SOLR
INTRAMUSCULAR | Status: AC
  Administered 2012-08-01: 125 mg via INTRAVENOUS
  Filled 2012-08-01: qty 2

## 2012-08-01 MED ORDER — SODIUM CHLORIDE 0.9 % IV SOLN
INTRAVENOUS | Status: DC
  Administered 2012-08-01: 18:00:00 via INTRAVENOUS

## 2012-08-01 MED ORDER — FENTANYL CITRATE 0.05 MG/ML IJ SOLN
INTRAMUSCULAR | Status: AC
  Filled 2012-08-01: qty 2

## 2012-08-01 MED ORDER — WARFARIN SODIUM 3 MG PO TABS: 1.5000 mg | ORAL_TABLET | Freq: Every day | ORAL | Status: DC

## 2012-08-01 MED ORDER — MIDAZOLAM HCL 2 MG/2ML IJ SOLN
INTRAMUSCULAR | Status: AC
  Filled 2012-08-01: qty 2

## 2012-08-01 MED ORDER — WARFARIN SODIUM 3 MG PO TABS
3.0000 mg | ORAL_TABLET | ORAL | Status: DC
  Administered 2012-08-01: 3 mg via ORAL
  Filled 2012-08-01: qty 1

## 2012-08-01 MED ORDER — WARFARIN SODIUM 1 MG PO TABS
1.5000 mg | ORAL_TABLET | ORAL | Status: DC
  Filled 2012-08-01: qty 1

## 2012-08-01 MED ORDER — ONDANSETRON HCL 4 MG/2ML IJ SOLN: 4.0000 mg | Freq: Four times a day (QID) | INTRAMUSCULAR | Status: DC | PRN

## 2012-08-01 MED ORDER — HEPARIN (PORCINE) IN NACL 2-0.9 UNIT/ML-% IJ SOLN
INTRAMUSCULAR | Status: AC
  Filled 2012-08-01: qty 500

## 2012-08-01 MED ORDER — SODIUM CHLORIDE 0.9 % IV SOLN
250.0000 mL | INTRAVENOUS | Status: DC
  Administered 2012-08-01: 15:00:00 via INTRAVENOUS

## 2012-08-01 MED ORDER — ASPIRIN EC 81 MG PO TBEC
81.0000 mg | DELAYED_RELEASE_TABLET | Freq: Every day | ORAL | Status: DC
  Administered 2012-08-02: 10:00:00 81 mg via ORAL
  Filled 2012-08-01: qty 1

## 2012-08-01 MED ORDER — CEFAZOLIN SODIUM 1-5 GM-% IV SOLN
1.0000 g | Freq: Four times a day (QID) | INTRAVENOUS | Status: DC
Start: 2012-08-01 — End: 2012-08-01
  Filled 2012-08-01 (×3): qty 50

## 2012-08-01 MED ORDER — SODIUM CHLORIDE 0.9 % IJ SOLN: 3.0000 mL | INTRAMUSCULAR | Status: DC | PRN

## 2012-08-01 MED ORDER — LEVOTHYROXINE SODIUM 75 MCG PO TABS
75.0000 ug | ORAL_TABLET | Freq: Every day | ORAL | Status: DC
  Administered 2012-08-02: 75 ug via ORAL
  Filled 2012-08-01: qty 1

## 2012-08-01 MED ORDER — CEFAZOLIN SODIUM 1-5 GM-% IV SOLN
1.0000 g | Freq: Four times a day (QID) | INTRAVENOUS | Status: AC
  Administered 2012-08-01 – 2012-08-02 (×3): 1 g via INTRAVENOUS
  Filled 2012-08-01 (×3): qty 50

## 2012-08-01 MED ORDER — DOCUSATE SODIUM 100 MG PO CAPS
100.0000 mg | ORAL_CAPSULE | Freq: Every day | ORAL | Status: DC
  Administered 2012-08-02: 100 mg via ORAL
  Filled 2012-08-01 (×2): qty 1

## 2012-08-01 MED ORDER — SODIUM CHLORIDE 0.45 % IV SOLN
INTRAVENOUS | Status: DC
  Administered 2012-08-01: 15:00:00 via INTRAVENOUS

## 2012-08-01 NOTE — Interval H&P Note (Signed)
History and Physical Interval Note:  08/01/2012 4:28 PM  [name]  has presented today for surgery, with the diagnosis of syncope/av block  The various methods of treatment have been discussed with the patient and family. After consideration of risks, benefits and other options for treatment, the patient has consented to  Procedure(s) (LRB) with comments: PERMANENT PACEMAKER INSERTION (N/A) as a surgical intervention .  The patient's history has been reviewed, patient examined, no change in status, stable for surgery.  I have reviewed the patient's chart and labs.  Questions were answered to the patient's satisfaction.     Sherryl Manges  lenghty discussions re potential benefits and limitations of pacing with carotid sinus hypersenstitviy    He agrees to proceed

## 2012-08-01 NOTE — H&P (View-Only) (Signed)
 ELECTROPHYSIOLOGY CONSULT NOTE  Patient ID: [name], MRN: [id number], DOB/AGE: 76/21/1929 76 y.o. Admit date: (Not on file) Date of Consult: 07/29/2012  Primary Physician: WONG,FRANCIS PATRICK, MD Primary Cardiologist: DM  Chief Complaint: sycope   HPI [name] is a 78 y.o. male  seen at the request of Dr. DM because of syncope.  He has a lifelong history (20 years) of spells characterized by a discomfort in the back of his head followed by spinning sensation and dizziness and nausea associated with profound diaphoresis and extreme pallor. These spells have become increasingly frequent and are now occurring a couple of times a day. Sometimes they are relatively brief lasting minutes other times that he can last hours. He describes himself with his bowels is becoming very weak and sometimes having to lie down. They abate on their own sometimes and sometimes an hour or 2.   He has had a couple of spells of syncope. The first occurred about 10 years ago. It he was walking and [facility]. He was resuscitated by having had his legs raised.. The episode that this evaluation occurred a few weeks ago. He had just finished eating breakfast consisting of eggs bacon grits etc. without warning he fell forward into his coffee. The duration of the episode has been estimated by his wife to be 2-3 minutes. She called 911. He was awake before they got there. Described as being pale. Talking to his mother, and counting out seconds, the son called back and said it was about 1-1/2 minutes or so.   His cardiac history is notable for mechanical aortic valve replacement. Echo August 2012 demonstrated normal left ventricular function. Catheterization 2001 demonstrated nonobstructive coronary disease. Stress test August 2013 demonstrated normal left ventricular function nonischemic Myoview.  He has a history of a prior stroke as well as subdermal hematoma requiring craniotomy 2004. He has  been treated with Coumadin. He also has a history of pulmonary embolism with prior IVC filter.  30 day monitor her demonstrated second-degree AV block at 2 to one block. And also show higher grade AV block with multiple sequential nonconductive P waves and pauses up to 3.5 seconds. These pauses were nocturnal. A sleep study was ordered.   Carotid Dopplers demonstrated 40-60% on the right and 0-39% on the last. He does not have significant dizziness driving or turning his head.  He denies exercise intolerance and is without chest pain or shortness of breath. He can walk "as far as I can "   Past Medical History  Diagnosis Date  . Aortic stenosis     St.Jude AVR in 1985; Echo 8/12: EF 50-55%, mild LVH, mech AV with mild AI, mean gradient 14 mmHg, mild to mod MR;  Echo 8/13: EF 60-65%, AVR ok with mean gradient 19 mmHg  . CAD (coronary artery disease)     Mild, nonobstructive at catheterization 5/01  . Personal history of subdural hematoma     Requiring craniotomy  . History of pulmonary embolism     post op DVT/PE;  s/p IVC filter  . Hyperlipidemia   . Hypothyroidism   . S/P cholecystectomy 2005  . CVA (cerebral infarction) 12/2010  . S/P shoulder surgery     left  . Carotid stenosis     Dopplers 8/13: RICA 40-50% and LICA 0-39% => followup 1 year      Surgical History:  Past Surgical History  Procedure Date  . Aortic valve replacement 1985    St. Jude  .   Cardiac catheterization      Home Meds: Prior to Admission medications   Medication Sig Start Date End Date Taking? Authorizing Provider  aspirin EC 81 MG tablet Take 1 tablet (81 mg total) by mouth daily. 06/08/11  Yes Dalton S McLean, MD  docusate sodium (COLACE) 100 MG capsule Take 100 mg by mouth daily.    Yes Historical Provider, MD  ezetimibe-simvastatin (VYTORIN) 10-40 MG per tablet Take 1 tablet by mouth daily.     Yes Historical Provider, MD  latanoprost (XALATAN) 0.005 % ophthalmic solution Place 1 drop into the  right eye at bedtime. 05/01/12  Yes Historical Provider, MD  levothyroxine (SYNTHROID, LEVOTHROID) 50 MCG tablet Take 75 mcg by mouth daily.    Yes Historical Provider, MD  Multiple Vitamin (MULTIVITAMIN) tablet Take 1 tablet by mouth daily.     Yes Historical Provider, MD  Tamsulosin HCl (FLOMAX) 0.4 MG CAPS Take 0.4 mg by mouth 2 (two) times daily.    Yes Historical Provider, MD  warfarin (COUMADIN) 3 MG tablet Take 3 mg by mouth as directed.     Yes Historical Provider, MD      Allergies:  Allergies  Allergen Reactions  . Codeine   . Ivp Dye (Iodinated Diagnostic Agents)   . Sulfonamide Derivatives     History   Social History  . Marital Status: Married    Spouse Name: N/A    Number of Children: N/A  . Years of Education: N/A   Occupational History  . Retired    Social History Main Topics  . Smoking status: Never Smoker   . Smokeless tobacco: Not on file  . Alcohol Use: No  . Drug Use: No  . Sexually Active: Not on file   Other Topics Concern  . Not on file   Social History Narrative  . No narrative on file     Family History  Problem Relation Age of Onset  . Coronary artery disease Other      ROS:  Please see the history of present illness.    All other systems reviewed and negative.    Physical Exam:  Blood pressure 116/70, pulse 72, height 5' 11" (1.803 m), weight 151 lb 1.9 oz (68.548 kg). General: Well developed, well nourished male in no acute distress. Head: Normocephalic, atraumatic, sclera non-icteric, no xanthomas, nares are without discharge. Lymph Nodes:  none Back: without scoliosis/kyphosis, no CVA tendersness Neck: Negative for carotid bruits. JVD not elevated. Lungs: Clear bilaterally to auscultation without wheezes, rales, or rhonchi. Breathing is unlabored. Heart: RRR with S1 S mechanical S2 with a 2/6 systolic murmur , rubs, or gallops appreciated. Abdomen: Soft, non-tender, non-distended with normoactive bowel sounds. No hepatomegaly.  No rebound/guarding. No obvious abdominal masses. Msk:  Strength and tone appear normal for age. Extremities: No clubbing or cyanosis. Noedema.  Distal pedal pulses are 2+ and equal bilaterally. Skin: Warm and Dry Neuro: Alert and oriented X 3. CN III-XII intact Grossly normal sensory and motor function . Psych:  Responds to questions appropriately with a normal affect.      Labs: Cardiac Enzymes No results found for this basename: CKTOTAL:4,CKMB:4,TROPONINI:4 in the last 72 hours CBC Lab Results  Component Value Date   WBC 3.0* 05/19/2008   HGB 13.2 05/19/2008   HCT 37.4* 05/19/2008   MCV 96.1 05/19/2008   PLT 138* 05/19/2008   PROTIME: No results found for this basename: LABPROT:3,INR:3 in the last 72 hours Chemistry No results found for this basename: NA,K,CL,CO2,BUN,CREATININE,CALCIUM,LABALBU,PROT,BILITOT,ALKPHOS,ALT,AST,GLUCOSE in the   last 168 hours Lipids Lab Results  Component Value Date   CHOL 132 05/19/2008   HDL 48.2 05/19/2008   LDLCALC 73 05/19/2008   TRIG 54 05/19/2008   BNP No results found for this basename: probnp   Miscellaneous No results found for this basename: DDIMER    Radiology/Studies:  No results found.  EKG: sinus with normla intervals   Carotid SInus Massage: Left-sided massage was associated with 6  second pause     Talayeh Bruinsma   

## 2012-08-01 NOTE — Progress Notes (Signed)
PHARMACIST - PHYSICIAN COMMUNICATION DR: Graciela Husbands  CONCERNING: Pharmacy Care Issues Regarding Warfarin Labs  RECOMMENDATION (Action Taken): A daily protime for three days has been ordered to meet the [facility] National Patient safety goal and comply with the current [facility] & Therapeutics Committee policy.   The Pharmacy will defer all warfarin dose order changes and follow up of lab results to the prescriber unless an additional order to initiate a "pharmacy Coumadin consult" is placed.  DESCRIPTION:  While hospitalized, to be in compliance with The Joint Commission National Patient Safety Goals, all patients on warfarin must have a baseline and/or current protime prior to the administration of warfarin. Pharmacy has received your order for warfarin without these required laboratory assessments. (Current PT/INR was already ordered on admission. Daily PT/INR x 3 days have been added to orders.)   Thank you,  Noah Delaine, RPh Clinical Pharmacist Pager: [phone] 08/01/2012 18:46

## 2012-08-01 NOTE — CV Procedure (Signed)
Preop DX::syncope  Post op DX:: same  Procedure  dual pacemaker implantation  After routine prep and drape, lidocaine was infiltrated in the prepectoral subclavicular region on the left side an incision was made and carried down to later the prepectoral fascia using electrocautery and sharp dissection a pocket was formed similarly. Hemostasis was obtained.  After this, we turned our attention to gaining accessm to the extrathoracic,left subclavian vein. This was accomplished without difficulty and without the aspiration of air or puncture of the artery. 2 separate venipunctures were accomplished; guidewires were placed and retained and sequentially 7 French sheath through which were  passed an Medtronic  ventricular lead serial [id number] and an Medtronic 5076  atrial lead serial number [id number] .  The ventricular lead was manipulated to the right ventricular apex with a bipolar R wave was 10.5, the pacing impedance was 1055, the threshold was 1.2 @ 0.4 msec  Current at threshold was   1.1  Ma and the current of injury was  brisk.  The right atrial lead was manipulated to the right atrial appendage with a bipolar P-wave  1.5, the pacing impedance was 788, the threshold 0.9@ 0.4 msec   Current at threshold was 1.2  Ma and the current of injury was brisk.  The ventricular lead was marked with a tie prior to the insertion of the atrial lead. The leads were affixed to the prepectoral fascia and attached to a  BIOTRONIK pulse generator serial number [id number].  Hemostasis was obtained. The pocket was copiously irrigated with antibiotic containing saline solution. The leads and the pulse generator were placed in the pocket and affixed to the prepectoral fascia. The wound was then closed in 3 layers in the normal fashion. The wound was washed dried and a benzoin Steri-Strip dressing was applied.  Needle  Count, sponge counts and instrument counts were correct at the end of the procedure .   The  patient tolerated the procedure without apparent complication.  Gerlene Burdock.D.

## 2012-08-02 ENCOUNTER — Ambulatory Visit (HOSPITAL_COMMUNITY): Payer: Medicare Other

## 2012-08-02 DIAGNOSIS — R55 Syncope and collapse: Secondary | ICD-10-CM

## 2012-08-02 MED ORDER — YOU HAVE A PACEMAKER BOOK
Freq: Once | Status: AC
  Administered 2012-08-02: 04:00:00
  Filled 2012-08-02: qty 1

## 2012-08-02 NOTE — Progress Notes (Signed)
   ELECTROPHYSIOLOGY ROUNDING NOTE    Patient Name: [name] Date of Encounter: 08-02-2012    SUBJECTIVE:Patient feels well.  No chest pain or shortness of breath.  Minimal incisional soreness.  Status post dual chamber pacemaker 08-01-2012.   TELEMETRY: Reviewed telemetry pt in sinus rhythm with occasional atrial pacing Filed Vitals:   08/01/12 2000 08/01/12 2100 08/01/12 2326 08/02/12 0429  BP: 134/68 113/79 115/64 136/72  Pulse:   78 79  Temp:   97.8 F (36.6 C) 97.6 F (36.4 C)  TempSrc:   Oral Oral  Resp: 18 15 20    Height:      Weight:      SpO2:   95% 96%    Intake/Output Summary (Last 24 hours) at 08/02/12 0708 Last data filed at 08/02/12 0426  Gross per 24 hour  Intake   1300 ml  Output      0 ml  Net   1300 ml    LABS: INR: 2.77  Radiology/Studies:  Final result pending, leads in stable position.  PHYSICAL EXAM Left chest without hematoma or ecchymosis BP 136/72  Pulse 79  Temp 97.6 F (36.4 C) (Oral)  Resp 20  Ht 5\' 11"  (1.803 m)  Wt 151 lb (68.493 kg)  BMI 21.06 kg/m2  SpO2 96%  DEVICE INTERROGATION: Device interrogation pending    Wound care, arm mobility, restrictions reviewed with patient.  Routine follow up scheduled. INR followed by Dr Kathi Der office-- he will call and move appt up to one day early next week for re-check.   Discharge plans  Reviewed instructions

## 2012-08-02 NOTE — Discharge Summary (Signed)
ELECTROPHYSIOLOGY PROCEDURE DISCHARGE SUMMARY    Patient ID: [name],  MRN: [id number], DOB/AGE: May 26, 1928 78 y.o.  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  Aortic stenosis status post St Jude AVR in 1985 2.  CAD- mild nonobstructive at cath in 2001 3.  Subdural hematoma requiring craniotomy 4.  History of PE and DVT s/p IVC filter 5.  Hyperlipidemia 6.  Hypothyroidism 7.  CVA in 2012 8.  Carotid stenosis  Procedures This Admission: 1.  Implantation of a dual chamber pacemaker on 08-01-2012 by Dr Graciela Husbands.  The patient received a Biotronik Evia pacemaker with model number 5076 right atrial and right ventricular leads.  There were no early apparent complications.  2.  CXR on 08-02-2012 demonstrated no pneumothorax status post device implantation.   Brief HPI: [name] is a 78 year old male referred to Dr Graciela Husbands for syncope and pauses demonstrated on event monitor. The patient had an abrupt onset episode of syncope the duration of which is not yet clear. An event recorder has demonstrated recurrent nocturnal pausing associated with high-grade heart block. No daytime episodes were noted. Furthermore, carotid sinus massage altough very impressive was associated with a somewhat different symptom complex. 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 We have discussed the role of pacing, and he is interested in proceeding that way as opposed with a loop recorder. That is a reasonable decision given the prolonged pause that was noted today. In addition, nocturnal pausing is of concern to him though it is not clear to him or to his family that obstructive sleep apnea is  infact the explanation .  I have reviewed this issue with Dr. DM; given the abrupt nature of his syncope, impressive carotid sinus massage we will plan to recommend pacing. Orthostatic hypotension has not been demonstrable; cardiac function is normal making ventricular arrhythmias unlikely and with CLS pacing we may be able to help only with his cardio inhibition but also potentially with a vasomotor component. Risks, benefits, and alternatives to pacemaker implantation were reviewed with the patient who wished to proceed.   Hospital Course:  The patient was admitted and underwent implantation of a dual chamber pacemaker with details as outlined above.   He was monitored on telemetry overnight which demonstrated intermittent atrial pacing.  Left chest was without hematoma or ecchymosis.  The device was interrogated and found to be functioning normally.  CXR was obtained and demonstrated no pneumothorax status post device implantation.  Wound care, arm mobility, and restrictions were reviewed with the patient.  Dr Graciela Husbands examined the patient and considered them stable for discharge to home.    Discharge Vitals: Blood pressure 108/46, pulse 79, temperature 97.6 F (36.4 C), temperature source Oral, resp. rate 17, height 5\' 11"  (1.803 m), weight 151 lb (68.493 kg), SpO2 96.00%.    Labs: INR 2.77  Discharge Medications:    Medication List     As of 08/02/2012  9:47 AM    ASK your doctor about these medications         aspirin EC 81 MG tablet   Take 1 tablet (81 mg total) by mouth daily.      docusate sodium 100 MG capsule   Commonly known as: COLACE  Take 100 mg by mouth daily.      ezetimibe-simvastatin 10-40 MG per tablet   Commonly known as: VYTORIN   Take 1 tablet by mouth daily.      FLOMAX 0.4 MG Caps   Generic drug: Tamsulosin HCl   Take 0.4 mg by mouth 2 (two) times daily.      latanoprost 0.005 % ophthalmic solution   Commonly known as: XALATAN   Place 1 drop into the right  eye at bedtime.      levothyroxine 75 MCG tablet   Commonly known as: SYNTHROID, LEVOTHROID   Take 75 mcg by mouth daily.      multivitamin tablet   Take 1 tablet by mouth daily.      predniSONE 20 MG tablet   Commonly known as: DELTASONE   Take three tablets by mouth at bedtime tonight (9/18)      warfarin 3 MG tablet   Commonly known as: COUMADIN   Take 1.5-3 mg by mouth daily. Takes 1.5 mg on Monday and Friday and 3 mg all other days.          Disposition:  Discharge Orders    Future Appointments: Provider: Department: Dept Phone: Center:   08/07/2012 8:00 PM [facility] [phone] [facility]   08/09/2012 2:45 PM Minda Meo, PA-C [facility] [place] [phone] [facility]   09/30/2012 3:45 PM Laurey Morale, MD [facility] [phone] [facility]   11/05/2012 11:45 AM Duke Salvia, MD [facility] [phone] [facility]     Follow-up Information    Follow up with EDMISTEN, BROOKE, PA-C. On 08/09/2012. (At 2:45 PM for wound check)    Contact information:   [address] [place] [address] [phone]       Follow up with Sherryl Manges, MD. On 11/05/2012. (At 11:45 AM)    Contact information:   [address] [place] [address] [phone]       Follow up with Marca Ancona, MD. On 09/30/2012. (At 3:45 PM)    Contact information:   [address] [place] [address] [phone]      Follow up with [facility]. (As scheduled next week for Coumadin follow-up (INR check))          Duration of Discharge Encounter: Greater than 30 minutes including physician time.  Signed, Gypsy Balsam, RN, BSN 08/02/2012, 9:47 AM .Noni Saupe .ssteve

## 2012-08-02 NOTE — Progress Notes (Signed)
Utilization Review Completed.  

## 2012-08-07 ENCOUNTER — Ambulatory Visit (HOSPITAL_BASED_OUTPATIENT_CLINIC_OR_DEPARTMENT_OTHER): Payer: Medicare Other | Attending: Cardiology

## 2012-08-07 VITALS — Ht 71.0 in | Wt 150.0 lb

## 2012-08-07 DIAGNOSIS — G4737 Central sleep apnea in conditions classified elsewhere: Secondary | ICD-10-CM | POA: Insufficient documentation

## 2012-08-07 DIAGNOSIS — G4733 Obstructive sleep apnea (adult) (pediatric): Secondary | ICD-10-CM | POA: Insufficient documentation

## 2012-08-09 ENCOUNTER — Ambulatory Visit (INDEPENDENT_AMBULATORY_CARE_PROVIDER_SITE_OTHER): Payer: Medicare Other | Admitting: Cardiology

## 2012-08-09 ENCOUNTER — Encounter: Payer: Self-pay | Admitting: Cardiology

## 2012-08-09 DIAGNOSIS — Z950 Presence of cardiac pacemaker: Secondary | ICD-10-CM

## 2012-08-09 DIAGNOSIS — R001 Bradycardia, unspecified: Secondary | ICD-10-CM

## 2012-08-09 NOTE — Progress Notes (Signed)
Wound check and device check only. See PaceArt report.

## 2012-08-14 LAB — PACEMAKER DEVICE OBSERVATION
AL AMPLITUDE: 3.6 mv
BRDY-0002RV: 60 {beats}/min
BRDY-0003RV: 120 {beats}/min
DEVICE MODEL PM: 66244569
RV LEAD AMPLITUDE: 15.1 mv
RV LEAD THRESHOLD: 0.4 V

## 2012-08-17 DIAGNOSIS — G4730 Sleep apnea, unspecified: Secondary | ICD-10-CM

## 2012-08-17 DIAGNOSIS — G4710 Hypersomnia, unspecified: Secondary | ICD-10-CM

## 2012-08-17 NOTE — Procedures (Signed)
NAME:  [name], [name] NO.:  [id number]  MEDICAL RECORD NO.:  [id number]          PATIENT TYPE:  OUT  LOCATION:  SLEEP CENTER                 FACILITY[facility]  [facility]  PHYSICIAN:  Barbaraann Share, MD,FCCPDATE OF BIRTH:  12/11/29  DATE OF STUDY:  08/07/2012                           NOCTURNAL POLYSOMNOGRAM  REFERRING PHYSICIAN:  Marca Ancona, MD  INDICATION FOR STUDY:  Hypersomnia with sleep apnea.  EPWORTH SLEEPINESS SCORE:  9.  SLEEP ARCHITECTURE:  The patient had a total sleep time of 193 minutes with no slow-wave sleep and only 24 minutes of REM.  Sleep onset latency was normal at 20 minutes and REM onset was at the upper limits of normal at 124 minutes.  Sleep efficiency was poor at 48%.  RESPIRATORY DATA:  The patient was found to have 50 apneas as well as 5 obstructive hypopneas, giving him an apnea/hypopnea index of 17 events per hour.  It should be noted that many of the apneas scored as central apneas were in reality mixed apneas.  The events were more common in the supine position and there was moderate snoring noted throughout.  The patient did not meet split night protocol secondary to the majority of his events occurring well past midnight.  OXYGEN DATA:  There was O2 desaturation as low as 91% with the patient's events.  CARDIAC DATA:  Occasional PVC noted, but no clinically significant arrhythmias were noted.  MOVEMENT/PARASOMNIA:  The patient had 269 limb movements with 2 per hour resulting in arousal or awakening.  No abnormal behaviors were seen.  IMPRESSION/RECOMMENDATION: 1. Mild obstructive and central sleep apnea with an AHI of 17 events     per hour and O2 desaturation as low as 91%.  Treatment for this     degree of sleep apnea can include upper airway surgery, dental     appliance, as well as CPAP.  Clinical correlation is suggested. 2. Occasional premature ventricular contraction noted, but no     clinically significant arrhythmias  were seen 3. Very large numbers of leg jerks with some degree of sleep     disruption.  It is really unclear whether this is     secondary to the patient's sleep disorder breathing, or whether he     may have a concomitant primary movement disorder of sleep.     Clinical correlation is suggested.     Barbaraann Share, MD,FCCP Diplomate, American Board of Sleep Medicine    [facility]/MEDQ  D:  08/17/2012 14:09:18  T:  08/17/2012 19:47:31  Job:  [id number]

## 2012-08-21 ENCOUNTER — Telehealth: Payer: Self-pay | Admitting: *Deleted

## 2012-08-21 NOTE — Telephone Encounter (Signed)
Only mild OSA but he may have restless leg syndrome. If he wants to pursue evaluation of this, could have appointment with Dr. Shelle Iron. ----- Message ----- From: Barbaraann Share, MD Sent: 08/20/2012 5:51 PM To: Laurey Morale, MD   08/28/12--pt's son [name] advised of results. He will discuss with pt and let me know if pt decides to pursue further evaluation for restless leg syndrome.

## 2012-09-30 ENCOUNTER — Ambulatory Visit (INDEPENDENT_AMBULATORY_CARE_PROVIDER_SITE_OTHER): Payer: Medicare Other | Admitting: Cardiology

## 2012-09-30 ENCOUNTER — Encounter: Payer: Self-pay | Admitting: Cardiology

## 2012-09-30 VITALS — BP 140/62 | HR 68 | Ht 71.0 in | Wt 150.0 lb

## 2012-09-30 DIAGNOSIS — Z952 Presence of prosthetic heart valve: Secondary | ICD-10-CM

## 2012-09-30 DIAGNOSIS — R55 Syncope and collapse: Secondary | ICD-10-CM

## 2012-09-30 DIAGNOSIS — Z954 Presence of other heart-valve replacement: Secondary | ICD-10-CM

## 2012-09-30 NOTE — Patient Instructions (Addendum)
Your physician wants you to follow-up in: 6 months with Dr Shirlee Latch. (May 2014). You will receive a reminder letter in the mail two months in advance. If you don't receive a letter, please call our office to schedule the follow-up appointment.

## 2012-10-01 NOTE — Progress Notes (Signed)
Patient ID: ALBEN KOSIER, male   DOB: 02-10-1928, 78 y.o.   MRN: [id number] PCP: Dr. Modesto Charon  78 yo with history of mechanical aortic valve replacement, subdural hematoma requiring craniotomy in 2004, and small CVA in 2/12 presents for cardiology followup.  In general, he does well symptomatically and is very active.  No exertional dyspnea or chest pain. He had a small CVA in 2/12 with a transient facial droop.  He had not been taking ASA since his subdural hematoma in 2004 but had been on coumadin.  After the CVA, he resumed ASA 81 mg daily.    Recently, he had episodes of high grade heart block with syncope and presyncope.  He had a Biotronik dual chamber PCM placed.  He has no complaints since the procedure.  His son thinks that he is doing better since the pacemaker was placed. No further presyncope or syncope.     Labs (8/13): K 4.2, creatinine 1.0, TSH 6.44 (Synthroid adjusted)  Labs (9/13): K 3.9, creatinine 1.18  PMH: 1. Hypothyroidism 2. Cholecystectomy 2005 3. St. Jude mechanical aortic valve replacement in 1985 for aortic stenosis.  Echo (8/12): EF 50-55%, mild LVH, mechanical aortic valve with mild AI and mean gradient 14 mmHg, mild to moderate MR. Echo (8/13): EF 60-65%, mechanical aortic valve is well-seated with mean gradient 19 mmHg. 4. DVT/PE (post-operative).  Has IVC filter.  5. Subdural hematoma requiring craniotomy in 2004.  6. Hyperlipidemia 7. Left heart cath (5/01): mild nonobstructive CAD 8. CVA: 2 /12, small with no residual effects.  9. Left shoulder surgery 10. Syncope (8/13): ETT-myoview was done with no ischemia or infarction, EF 54%.  Echo unremarkable, see above.  Carotid dopplers with 40-59% RICA stenosis.  30 day event monitor with episodes of Type I 2nd degree and 2:1 heart block.  The monitor also showed higher-grade heart block with several nonconducted Ps in a row, up to 3.5 second pauses.  Most arrhythmias took place at night.  Biotronik dual chamber PCM  placed in 9/13.    SH: Married Eulah [place]), lives outside [place], nonsmoker.   FH: No premature CAD.    Current Outpatient Prescriptions  Medication Sig Dispense Refill  . aspirin EC 81 MG tablet Take 1 tablet (81 mg total) by mouth daily.      Marland Kitchen docusate sodium (COLACE) 100 MG capsule Take 100 mg by mouth daily.       Marland Kitchen ezetimibe-simvastatin (VYTORIN) 10-40 MG per tablet Take 1 tablet by mouth daily.        Marland Kitchen latanoprost (XALATAN) 0.005 % ophthalmic solution Place 1 drop into the right eye at bedtime.      Marland Kitchen levothyroxine (SYNTHROID, LEVOTHROID) 75 MCG tablet Take 75 mcg by mouth daily.      . Multiple Vitamin (MULTIVITAMIN) tablet Take 1 tablet by mouth daily.        . Tamsulosin HCl (FLOMAX) 0.4 MG CAPS Take 0.4 mg by mouth 2 (two) times daily.       Marland Kitchen warfarin (COUMADIN) 3 MG tablet Take 1.5-3 mg by mouth daily. Takes 1.5 mg on Monday and Friday and 3 mg all other days.        BP 140/62  Pulse 68  Ht 5\' 11"  (1.803 m)  Wt 150 lb (68.04 kg)  BMI 20.92 kg/m2 General: NAD Neck: No JVD, no thyromegaly or thyroid nodule.  Lungs: Clear to auscultation bilaterally with normal respiratory effort. CV: Nondisplaced PMI.  Heart regular S1/S2, mechanical S2, no S3/S4, 1/6 systolic  murmur LLSB.  No peripheral edema.  No carotid bruit. 2+ PT bilaterally.  Abdomen: Soft, nontender, no hepatosplenomegaly, no distention.  Neurologic: Alert and oriented x 3.  Psych: Normal affect. Extremities: No clubbing or cyanosis.   Assessment/Plan  1. Aortic valve replaced  St. Jude mechanical aortic valve replacement in 1985. Symptomatically, he is doing well with no CHF symptoms. He has been tolerating ASA 81 mg daily and coumadin with no problems since ASA was restarted in 2/12 after CVA. He does have history of subdural hematoma in 2004 while on coumadin. Given history of stroke, he should be bridged with heparin gtt if he needs to come off coumadin at some point in the future. Last echo  showed normal functioning valve.  2. High grade heart block Status post Biotronik PCM. He has been doing well with no further syncope or presyncope.  Marca Ancona 10/01/2012

## 2012-10-29 ENCOUNTER — Encounter: Payer: Self-pay | Admitting: Internal Medicine

## 2012-10-29 ENCOUNTER — Ambulatory Visit (INDEPENDENT_AMBULATORY_CARE_PROVIDER_SITE_OTHER): Payer: Medicare Other | Admitting: Internal Medicine

## 2012-10-29 VITALS — BP 121/75 | HR 69 | Wt 152.0 lb

## 2012-10-29 DIAGNOSIS — I495 Sick sinus syndrome: Secondary | ICD-10-CM

## 2012-10-29 DIAGNOSIS — R55 Syncope and collapse: Secondary | ICD-10-CM

## 2012-10-29 DIAGNOSIS — Z950 Presence of cardiac pacemaker: Secondary | ICD-10-CM

## 2012-10-29 DIAGNOSIS — G9001 Carotid sinus syncope: Secondary | ICD-10-CM

## 2012-10-29 LAB — PACEMAKER DEVICE OBSERVATION
AL AMPLITUDE: 3.7 mv
AL IMPEDENCE PM: 448 Ohm
AL THRESHOLD: 0.6 V
RV LEAD AMPLITUDE: 14.2 mv
RV LEAD IMPEDENCE PM: 565 Ohm
VENTRICULAR PACING PM: 2

## 2012-10-29 NOTE — Assessment & Plan Note (Addendum)
No recurrent dizziness or lightheadedness since the implantation of his pacemaker; this is quite a surprise but a pleasant one

## 2012-10-29 NOTE — Progress Notes (Signed)
[facility] Patient Care Team: Ileana Ladd, MD as PCP - General (Family Medicine)   HPI  [name] is a 78 y.o. male Seen in followup for syncope which was associated with carotid sinus hypersensitivity and we elected at that time to proceed with pacing  Past Medical History  Diagnosis Date  . Aortic stenosis     St.Jude AVR in 1985; Echo 8/12: EF 50-55%, mild LVH, mech AV with mild AI, mean gradient 14 mmHg, mild to mod MR;  Echo 8/13: EF 60-65%, AVR ok with mean gradient 19 mmHg  . CAD (coronary artery disease)     Mild, nonobstructive at catheterization 5/01  . Personal history of subdural hematoma     Requiring craniotomy  . History of pulmonary embolism     post op DVT/PE;  s/p IVC filter  . Hyperlipidemia   . Hypothyroidism   . S/P cholecystectomy 2005  . CVA (cerebral infarction) 12/2010  . S/P shoulder surgery     left  . Carotid stenosis     Dopplers 8/13: RICA 40-50% and LICA 0-39% => followup 1 year  . DVT (deep venous thrombosis)     Past Surgical History  Procedure Date  . Aortic valve replacement 1985    St. Jude  . Cardiac catheterization   . Vascular surgery     Current Outpatient Prescriptions  Medication Sig Dispense Refill  . aspirin EC 81 MG tablet Take 1 tablet (81 mg total) by mouth daily.      Marland Kitchen docusate sodium (COLACE) 100 MG capsule Take 100 mg by mouth daily.       Marland Kitchen ezetimibe-simvastatin (VYTORIN) 10-40 MG per tablet Take 1 tablet by mouth daily.        Marland Kitchen latanoprost (XALATAN) 0.005 % ophthalmic solution Place 1 drop into the right eye at bedtime.      Marland Kitchen levothyroxine (SYNTHROID, LEVOTHROID) 75 MCG tablet Take 75 mcg by mouth daily.      . Multiple Vitamin (MULTIVITAMIN) tablet Take 1 tablet by mouth daily.        . Tamsulosin HCl (FLOMAX) 0.4 MG CAPS Take 0.4 mg by mouth 2 (two) times daily.       Marland Kitchen warfarin (COUMADIN) 3 MG tablet Take 1.5-3 mg by mouth daily. Takes 1.5 mg on Monday and Friday and 3 mg all other days.        Allergies   Allergen Reactions  . Codeine   . Ivp Dye (Iodinated Diagnostic Agents)   . Sulfonamide Derivatives     Review of Systems negative except from HPI and PMH  Physical Exam BP 121/75  Pulse 69  Wt 152 lb (68.947 kg) Well developed and well nourished in no acute distress HENT normal E scleral and icterus clear Neck Supple Clear to ausculation Device pocket well healed; without hematoma or erythema Regular rate and rhythm, n  Soft with active bowel sounds No clubbing cyanosis none Edema Alert and oriented, grossly normal motor and sensory function Skin Warm and Dry    Assessment and  Plan

## 2012-10-29 NOTE — Patient Instructions (Addendum)
Your physician wants you to follow-up in: September 2014 [name] and [name] in the device clinic. You will receive a reminder letter in the mail two months in advance. If you don't receive a letter, please call our office to schedule the follow-up appointment.

## 2012-10-29 NOTE — Assessment & Plan Note (Signed)
No recurrent syncope 

## 2012-10-29 NOTE — Assessment & Plan Note (Signed)
The patient's device was interrogated and the information was fully reviewed.  The device was reprogrammed to  Maximize longevity 

## 2012-11-05 ENCOUNTER — Encounter: Payer: Medicare Other | Admitting: Internal Medicine

## 2013-02-04 ENCOUNTER — Ambulatory Visit (INDEPENDENT_AMBULATORY_CARE_PROVIDER_SITE_OTHER): Payer: Medicare Other | Admitting: Pharmacist Clinician (PhC)/ Clinical Pharmacy Specialist

## 2013-02-04 DIAGNOSIS — I4891 Unspecified atrial fibrillation: Secondary | ICD-10-CM

## 2013-02-04 DIAGNOSIS — I441 Atrioventricular block, second degree: Secondary | ICD-10-CM

## 2013-02-04 DIAGNOSIS — I480 Paroxysmal atrial fibrillation: Secondary | ICD-10-CM | POA: Insufficient documentation

## 2013-02-04 DIAGNOSIS — Z954 Presence of other heart-valve replacement: Secondary | ICD-10-CM

## 2013-02-04 DIAGNOSIS — Z952 Presence of prosthetic heart valve: Secondary | ICD-10-CM

## 2013-03-18 ENCOUNTER — Ambulatory Visit (INDEPENDENT_AMBULATORY_CARE_PROVIDER_SITE_OTHER): Payer: Medicare Other | Admitting: Pharmacist Clinician (PhC)/ Clinical Pharmacy Specialist

## 2013-03-18 DIAGNOSIS — Z954 Presence of other heart-valve replacement: Secondary | ICD-10-CM

## 2013-03-18 DIAGNOSIS — Z952 Presence of prosthetic heart valve: Secondary | ICD-10-CM

## 2013-03-18 DIAGNOSIS — I4891 Unspecified atrial fibrillation: Secondary | ICD-10-CM

## 2013-03-18 LAB — POCT INR: INR: 2

## 2013-03-31 ENCOUNTER — Ambulatory Visit (INDEPENDENT_AMBULATORY_CARE_PROVIDER_SITE_OTHER): Payer: Medicare Other | Admitting: Cardiology

## 2013-03-31 ENCOUNTER — Encounter: Payer: Self-pay | Admitting: Cardiology

## 2013-03-31 VITALS — BP 124/60 | HR 81 | Ht 70.0 in | Wt 149.0 lb

## 2013-03-31 DIAGNOSIS — E785 Hyperlipidemia, unspecified: Secondary | ICD-10-CM

## 2013-03-31 DIAGNOSIS — Z952 Presence of prosthetic heart valve: Secondary | ICD-10-CM

## 2013-03-31 DIAGNOSIS — I359 Nonrheumatic aortic valve disorder, unspecified: Secondary | ICD-10-CM

## 2013-03-31 DIAGNOSIS — I4891 Unspecified atrial fibrillation: Secondary | ICD-10-CM

## 2013-03-31 DIAGNOSIS — I6523 Occlusion and stenosis of bilateral carotid arteries: Secondary | ICD-10-CM

## 2013-03-31 DIAGNOSIS — I658 Occlusion and stenosis of other precerebral arteries: Secondary | ICD-10-CM

## 2013-03-31 DIAGNOSIS — Z954 Presence of other heart-valve replacement: Secondary | ICD-10-CM

## 2013-03-31 LAB — LIPID PANEL
HDL: 49.3 mg/dL (ref >39.00)
LDL Cholesterol: 46 mg/dL (ref 0–99)
VLDL: 14.2 mg/dL (ref 0.0–40.0)

## 2013-03-31 NOTE — Patient Instructions (Addendum)
Your physician recommends that you have lab today--lipid profile.   Your physician has requested that you have a carotid duplex. This test is an ultrasound of the carotid arteries in your neck. It looks at blood flow through these arteries that supply the brain with blood. Allow one hour for this exam. There are no restrictions or special instructions. September 2014.  Your physician recommends that you schedule a follow-up appointment in: September with Dr Graciela Husbands.   Your physician wants you to follow-up in: 1 year with Dr Shirlee Latch. (May 2015). You will receive a reminder letter in the mail two months in advance. If you don't receive a letter, please call our office to schedule the follow-up appointment.

## 2013-03-31 NOTE — Progress Notes (Signed)
Patient ID: [name], male   DOB: 08-03-1928, 79 y.o.   MRN: [id number] PCP: Dr. Modesto [name]  79 yo with history of mechanical aortic valve replacement, subdural hematoma requiring craniotomy in 2004, and small CVA in 2/12 presents for cardiology followup.  He had a small CVA in 2/12 with a transient facial droop.  He had not been taking ASA since his subdural hematoma in 2004 but had been on coumadin.  After the CVA, he resumed ASA 81 mg daily.  High grade heart block was noted in 9/13 and he got a Biotronik PCM.    In general, he does well symptomatically and is very active.  No exertional dyspnea or chest pain.  He has planted a large garden this year.  Occasional dizziness if he stands up too fast.    ECG: a-paced, v-sensed with left axis deviation.  Labs (8/13): K 4.2, creatinine 1.0, TSH 6.44 (Synthroid adjusted)  Labs (9/13): K 3.9, creatinine 1.18  PMH: 1. Hypothyroidism 2. Cholecystectomy 2005 3. St. Jude mechanical aortic valve replacement in 1985 for aortic stenosis.  Echo (8/12): EF 50-55%, mild LVH, mechanical aortic valve with mild AI and mean gradient 14 mmHg, mild to moderate MR. Echo (8/13): EF 60-65%, mechanical aortic valve is well-seated with mean gradient 19 mmHg. 4. DVT/PE (post-operative).  Has IVC filter.  5. Subdural hematoma requiring craniotomy in 2004.  6. Hyperlipidemia 7. Left heart cath (5/01): mild nonobstructive CAD 8. CVA: 2 /12, small with no residual effects.  9. Left shoulder surgery 10. Syncope (8/13): ETT-myoview was done with no ischemia or infarction, EF 54%.  Echo unremarkable, see above.  Carotid dopplers with 40-59% RICA stenosis.  30 day event monitor with episodes of Type I 2nd degree and 2:1 heart block.  The monitor also showed higher-grade heart block with several nonconducted Ps in a row, up to 3.5 second pauses.  Most arrhythmias took place at night.  Biotronik dual chamber PCM placed in 9/13.   11. Carotid stenosis: Carotids dopplers (9/13)  with 40-59% RICA.   SH: Married [name]), lives outside [place], nonsmoker.   FH: No premature CAD.    Current Outpatient Prescriptions  Medication Sig Dispense Refill  . aspirin EC 81 MG tablet Take 1 tablet (81 mg total) by mouth daily.      Marland Kitchen docusate sodium (COLACE) 100 MG capsule Take 100 mg by mouth daily.       Marland Kitchen ezetimibe-simvastatin (VYTORIN) 10-40 MG per tablet Take 1 tablet by mouth daily.        Marland Kitchen latanoprost (XALATAN) 0.005 % ophthalmic solution Place 1 drop into the right eye at bedtime.      Marland Kitchen levothyroxine (SYNTHROID, LEVOTHROID) 75 MCG tablet Take 75 mcg by mouth daily.      . Multiple Vitamin (MULTIVITAMIN) tablet Take 1 tablet by mouth daily.        . Tamsulosin HCl (FLOMAX) 0.4 MG CAPS Take 0.4 mg by mouth 2 (two) times daily.       Marland Kitchen warfarin (COUMADIN) 3 MG tablet Take 1.5-3 mg by mouth daily. Takes 1.5 mg on Monday and Friday and 3 mg all other days.       No current facility-administered medications for this visit.    BP 124/60  Pulse 81  Ht 5\' 10"  (1.778 m)  Wt 149 lb (67.586 kg)  BMI 21.38 kg/m2  SpO2 98% General: NAD Neck: No JVD, no thyromegaly or thyroid nodule.  Lungs: Clear to auscultation bilaterally with normal respiratory effort.  CV: Nondisplaced PMI.  Heart regular S1/S2, mechanical S2, no S3/S4, 1/6 systolic murmur LLSB.  No peripheral edema.  No carotid bruit. 2+ PT bilaterally.  Abdomen: Soft, nontender, no hepatosplenomegaly, no distention.  Neurologic: Alert and oriented x 3.  Psych: Normal affect. Extremities: No clubbing or cyanosis.   Assessment/Plan  1. Aortic valve replaced  St. Jude mechanical aortic valve replacement in 1985. Symptomatically, he is doing well with no CHF symptoms. He has been tolerating ASA 81 mg daily and coumadin with no problems since ASA was restarted in 2/12 after CVA. He does have history of subdural hematoma in 2004 while on coumadin. Given history of stroke, he should be bridged with heparin  gtt if he needs to come off coumadin at some point in the future. Last echo showed normal functioning valve.  2. High grade heart block Status post Biotronik PCM. He has been doing well with no further syncope or presyncope. 3. Carotid stenosis Check carotid dopplers in 9/14.  4. Hyperlipidemia Continue statin given vascular disease (carotid stenosis).  Will check lipids.    [name] 03/31/2013

## 2013-05-06 ENCOUNTER — Ambulatory Visit (INDEPENDENT_AMBULATORY_CARE_PROVIDER_SITE_OTHER): Payer: Medicare Other | Admitting: Pharmacist Clinician (PhC)/ Clinical Pharmacy Specialist

## 2013-05-06 DIAGNOSIS — Z952 Presence of prosthetic heart valve: Secondary | ICD-10-CM

## 2013-05-06 DIAGNOSIS — I4891 Unspecified atrial fibrillation: Secondary | ICD-10-CM

## 2013-05-06 DIAGNOSIS — Z954 Presence of other heart-valve replacement: Secondary | ICD-10-CM

## 2013-05-06 LAB — POCT INR: INR: 2

## 2013-05-21 ENCOUNTER — Ambulatory Visit (INDEPENDENT_AMBULATORY_CARE_PROVIDER_SITE_OTHER): Payer: Medicare Other | Admitting: Family Medicine

## 2013-05-21 ENCOUNTER — Other Ambulatory Visit (HOSPITAL_COMMUNITY): Payer: Self-pay | Admitting: Family Medicine

## 2013-05-21 ENCOUNTER — Telehealth: Payer: Self-pay | Admitting: Family Medicine

## 2013-05-21 ENCOUNTER — Telehealth: Payer: Self-pay | Admitting: Internal Medicine

## 2013-05-21 VITALS — BP 131/77 | HR 88 | Temp 98.3°F | Wt 146.6 lb

## 2013-05-21 DIAGNOSIS — B029 Zoster without complications: Secondary | ICD-10-CM

## 2013-05-21 MED ORDER — ACYCLOVIR 800 MG PO TABS: ORAL_TABLET | ORAL | Status: DC

## 2013-05-21 MED ORDER — HYDROCODONE-ACETAMINOPHEN 5-325 MG PO TABS: 1.0000 | ORAL_TABLET | Freq: Four times a day (QID) | ORAL | Status: DC | PRN

## 2013-05-21 NOTE — Progress Notes (Signed)
  Subjective:    Patient ID: [name], male    DOB: 01-Jan-1928, 79 y.o.   MRN: [id number]  HPI This 79 y.o. male presents for evaluation of rash over left forehead and eye.  He is c/o left eye pain. He has pain in his left ear, left eye, left forehead, and left scalp.  He has been hurting for a day.  He c/o Diminished vision in his left eye.   Review of Systems Complains of left eye pain and diminished vision, c/o rash and pain on his scalp No chest pain, SOB, HA, dizziness, N/V, diarrhea, constipation, dysuria, urinary urgency or frequency, myalgias, arthralgias.     Objective:   Physical Exam Vital signs noted  Well developed well nourished male.  HEENT - Head atraumatic Normocephalic  Rash on scalp and forehead.                Eyes - PERRLA, Conjuctiva - clear Sclera- Clear EOMI Rash over left eye.                Ears - EAC's Wnl TM's Wnl Gross Hearing WNL                Nose - Nares patent                 Throat - oropharanx wnl Respiratory - Lungs CTA bilateral Cardiac - RRR S1 and S2 without murmur GI - Abdomen soft Nontender and bowel sounds active x 4 Skin - Vesicular erythematous raised rash from left eye to left scalp Extremities - No edema. Neuro - Grossly intact.       Assessment & Plan:  Shingles - Plan: acyclovir (ZOVIRAX) 800 MG tablet, Ambulatory referral to Ophthalmology [facility] pain medicine, follow up in one week.  Left eye pain - Refer to opthamologist ASAP, appointment made and patient will be seen now.  Follow up in one week.

## 2013-05-21 NOTE — Telephone Encounter (Signed)
appt at 2:00 with Ander Slade

## 2013-05-21 NOTE — Patient Instructions (Signed)
Shingles Shingles (herpes zoster) is an infection that is caused by the same virus that causes chickenpox (varicella). The infection causes a painful skin rash and fluid-filled blisters, which eventually break open, crust over, and heal. It may occur in any area of the body, but it usually affects only one side of the body or face. The pain of shingles usually lasts about 1 month. However, some people with shingles may develop long-term (chronic) pain in the affected area of the body. Shingles often occurs many years after the person had chickenpox. It is more common:  In people older than 50 years.  In people with weakened immune systems, such as those with HIV, AIDS, or cancer.  In people taking medicines that weaken the immune system, such as transplant medicines.  In people under great stress. CAUSES  Shingles is caused by the varicella zoster virus (VZV), which also causes chickenpox. After a person is infected with the virus, it can remain in the person's body for years in an inactive state (dormant). To cause shingles, the virus reactivates and breaks out as an infection in a nerve root. The virus can be spread from person to person (contagious) through contact with open blisters of the shingles rash. It will only spread to people who have not had chickenpox. When these people are exposed to the virus, they may develop chickenpox. They will not develop shingles. Once the blisters scab over, the person is no longer contagious and cannot spread the virus to others. SYMPTOMS  Shingles shows up in stages. The initial symptoms may be pain, itching, and tingling in an area of the skin. This pain is usually described as burning, stabbing, or throbbing.In a few days or weeks, a painful red rash will appear in the area where the pain, itching, and tingling were felt. The rash is usually on one side of the body in a band or belt-like pattern. Then, the rash usually turns into fluid-filled blisters. They  will scab over and dry up in approximately 2 3 weeks. Flu-like symptoms may also occur with the initial symptoms, the rash, or the blisters. These may include:  Fever.  Chills.  Headache.  Upset stomach. DIAGNOSIS  Your caregiver will perform a skin exam to diagnose shingles. Skin scrapings or fluid samples may also be taken from the blisters. This sample will be examined under a microscope or sent to a lab for further testing. TREATMENT  There is no specific cure for shingles. Your caregiver will likely prescribe medicines to help you manage the pain, recover faster, and avoid long-term problems. This may include antiviral drugs, anti-inflammatory drugs, and pain medicines. HOME CARE INSTRUCTIONS   Take a cool bath or apply cool compresses to the area of the rash or blisters as directed. This may help with the pain and itching.   Only take over-the-counter or prescription medicines as directed by your caregiver.   Rest as directed by your caregiver.  Keep your rash and blisters clean with mild soap and cool water or as directed by your caregiver.  Do not pick your blisters or scratch your rash. Apply an anti-itch cream or numbing creams to the affected area as directed by your caregiver.  Keep your shingles rash covered with a loose bandage (dressing).  Avoid skin contact with:  Babies.   Pregnant women.   Children with eczema.   Elderly people with transplants.   People with chronic illnesses, such as leukemia or AIDS.   Wear loose-fitting clothing to help ease   the pain of material rubbing against the rash.  Keep all follow-up appointments with your caregiver.If the area involved is on your face, you may receive a referral for follow-up to a specialist, such as an eye doctor (ophthalmologist) or an ear, nose, and throat (ENT) doctor. Keeping all follow-up appointments will help you avoid eye complications, chronic pain, or disability.  SEEK IMMEDIATE MEDICAL  CARE IF:   You have facial pain, pain around the eye area, or loss of feeling on one side of your face.  You have ear pain or ringing in your ear.  You have loss of taste.  Your pain is not relieved with prescribed medicines.   Your redness or swelling spreads.   You have more pain and swelling.  Your condition is worsening or has changed.   You have a feveror persistent symptoms for more than 2 3 days.  You have a fever and your symptoms suddenly get worse. MAKE SURE YOU:  Understand these instructions.  Will watch your condition.  Will get help right away if you are not doing well or get worse. Document Released: 10/30/2005 Document Revised: 07/24/2012 Document Reviewed: 06/13/2012 [employer] Patient Information 2014 [employer].  

## 2013-05-21 NOTE — Telephone Encounter (Signed)
New Problem:    Patient called in because he is experiencing shooting pains coming out of his ear.  Patient stated that they were not constant but when they do happen, they feel like an electrical current.  Please call back.

## 2013-05-21 NOTE — Telephone Encounter (Signed)
Spoke w/pt in regards to shooting pains coming out of ear---advised pt to call PCP. Per symptoms does not seem to be pacemaker related. Pt understands and will call PCP.

## 2013-05-26 ENCOUNTER — Encounter: Payer: Self-pay | Admitting: Family Medicine

## 2013-05-26 ENCOUNTER — Ambulatory Visit (INDEPENDENT_AMBULATORY_CARE_PROVIDER_SITE_OTHER): Payer: Medicare Other | Admitting: Family Medicine

## 2013-05-26 VITALS — BP 125/66 | HR 77 | Temp 97.6°F | Wt 146.4 lb

## 2013-05-26 DIAGNOSIS — B029 Zoster without complications: Secondary | ICD-10-CM | POA: Insufficient documentation

## 2013-05-26 NOTE — Progress Notes (Signed)
Patient ID: [name], male   DOB: 1928-01-21, 79 y.o.   MRN: [id number] SUBJECTIVE: CC: Chief Complaint  Patient presents with  . Follow-up    reck shingles   doing  better    HPI: Doing a lot better. Saw the ophthalmologist and he said it did not go into the eye but he wanted to see him once more and he has that appointment next week. He is on day 6 of acyclovir. Still has a couple of lesions on the forehead, and occassional sharp pains on the side of the head.  Past Medical History  Diagnosis Date  . Aortic stenosis     St.Jude AVR in 1985; Echo 8/12: EF 50-55%, mild LVH, mech AV with mild AI, mean gradient 14 mmHg, mild to mod MR;  Echo 8/13: EF 60-65%, AVR ok with mean gradient 19 mmHg  . CAD (coronary artery disease)     Mild, nonobstructive at catheterization 5/01  . Personal history of subdural hematoma     Requiring craniotomy  . History of pulmonary embolism     post op DVT/PE;  s/p IVC filter  . Hyperlipidemia   . Hypothyroidism   . S/P cholecystectomy 2005  . CVA (cerebral infarction) 12/2010  . S/P shoulder surgery     left  . Carotid stenosis     Dopplers 8/13: RICA 40-50% and LICA 0-39% => followup 1 year  . DVT (deep venous thrombosis)   . Syncope     + carotid sinus massage  . Pacemaker  Biotronik    Past Surgical History  Procedure Laterality Date  . Aortic valve replacement  1985    [facility] Jude  . Cardiac catheterization    . Vascular surgery     Current Outpatient Prescriptions on File Prior to Visit  Medication Sig Dispense Refill  . acyclovir (ZOVIRAX) 800 MG tablet Take one po 5 x daily for 7 days  35 tablet  1  . aspirin EC 81 MG tablet Take 1 tablet (81 mg total) by mouth daily.      Marland Kitchen COUMADIN 3 MG tablet TAKE 1 TABLET EVERY DAY AS DIRECTED BY COUMADIN CLINIC  90 tablet  0  . docusate sodium (COLACE) 100 MG capsule Take 100 mg by mouth daily.       Marland Kitchen ezetimibe-simvastatin (VYTORIN) 10-40 MG per tablet Take 1 tablet by mouth daily.        Marland Kitchen  HYDROcodone-acetaminophen (NORCO) 5-325 MG per tablet Take 1 tablet by mouth every 6 (six) hours as needed for pain.  30 tablet  0  . latanoprost (XALATAN) 0.005 % ophthalmic solution Place 1 drop into the right eye at bedtime.      Marland Kitchen levothyroxine (SYNTHROID, LEVOTHROID) 75 MCG tablet Take 75 mcg by mouth daily.      . Multiple Vitamin (MULTIVITAMIN) tablet Take 1 tablet by mouth daily.        . Tamsulosin HCl (FLOMAX) 0.4 MG CAPS Take 0.4 mg by mouth 2 (two) times daily.        No current facility-administered medications on file prior to visit.   Allergies  Allergen Reactions  . Codeine   . Ivp Dye (Iodinated Diagnostic Agents)   . Sulfonamide Derivatives     There is no immunization history on file for this patient. History   Social History  . Marital Status: Married    Spouse Name: N/A    Number of Children: N/A  . Years of Education: N/A   Occupational  History  . Retired    Social History Main Topics  . Smoking status: Never Smoker   . Smokeless tobacco: Not on file  . Alcohol Use: No  . Drug Use: No  . Sexually Active: Not on file   Other Topics Concern  . Not on file   Social History Narrative  . No narrative on file      ROS: As above in the HPI. All other systems are stable or negative.  OBJECTIVE: APPEARANCE:  Patient in no acute distress.The patient appeared well nourished and normally developed. Acyanotic. Waist: VITAL SIGNS:BP 125/66  Pulse 77  Temp(Src) 97.6 F (36.4 C) (Oral)  Wt 146 lb 6.4 oz (66.407 kg)  BMI 21.01 kg/m2 WM  SKIN: warm and  Dry. 2 patches on the left eyebrow and forehead. Crusted and  Healing. A few red patches in the left parietal area.mild  Sensitivity.  HEAD and Neck: without JVD, Head and scalp: normal Eyes:No scleral icterus. Fundi normal, eye movements normal. Ears: Auricle normal, canal normal, Tympanic membranes normal, insufflation normal. Nose: normal Throat: normal Neck & thyroid: normal  CHEST &  LUNGS: Chest wall: normal Lungs: Clear  CVS: Reveals the PMI to be normally located. Regular rhythm, First and Second Heart sounds are normal,  absence of murmurs, rubs or gallops. Peripheral vasculature: Radial pulses: normal Dorsal pedis pulses: normal Posterior pulses: normal  ABDOMEN:  Appearance: normal Benign, no organomegaly, no masses, no Abdominal Aortic enlargement. No Guarding , no rebound. No Bruits. Bowel sounds: normal  RECTAL: N/A GU: N/A  NEUROLOGIC: oriented to time,place and person; nonfocal.  ASSESSMENT: Herpes zoster   PLAN: Continue the acyclovir and  Refill for another week since still has a few active  Lesions and he has 1 day of the acyclovir left.  Return in about 2 weeks (around 06/09/2013) for recheck shingles.  Annalycia Done P. [place] Charon, M.D.

## 2013-06-09 ENCOUNTER — Ambulatory Visit (INDEPENDENT_AMBULATORY_CARE_PROVIDER_SITE_OTHER): Payer: Medicare Other | Admitting: Family Medicine

## 2013-06-09 ENCOUNTER — Encounter: Payer: Self-pay | Admitting: Family Medicine

## 2013-06-09 VITALS — BP 112/63 | HR 85 | Temp 97.1°F | Wt 146.4 lb

## 2013-06-09 DIAGNOSIS — B029 Zoster without complications: Secondary | ICD-10-CM

## 2013-06-09 NOTE — Progress Notes (Signed)
Patient ID: [name], male   DOB: 1928-01-02, 79 y.o.   MRN: [id number] SUBJECTIVE: CC: Chief Complaint  Patient presents with  . Follow-up    2 wk  reck shingles  saw dr Dione Booze last week    HPI: Here for follow up on the shingles. The left parietal area.is sensitive to touch but no pain. Saw the  Eye doctor and he said his eyes wer not affected. Otherwise his medical problems are stable.  Past Medical History  Diagnosis Date  . Aortic stenosis     St.Jude AVR in 1985; Echo 8/12: EF 50-55%, mild LVH, mech AV with mild AI, mean gradient 14 mmHg, mild to mod MR;  Echo 8/13: EF 60-65%, AVR ok with mean gradient 19 mmHg  . CAD (coronary artery disease)     Mild, nonobstructive at catheterization 5/01  . Personal history of subdural hematoma     Requiring craniotomy  . History of pulmonary embolism     post op DVT/PE;  s/p IVC filter  . Hyperlipidemia   . Hypothyroidism   . S/P cholecystectomy 2005  . CVA (cerebral infarction) 12/2010  . S/P shoulder surgery     left  . Carotid stenosis     Dopplers 8/13: RICA 40-50% and LICA 0-39% => followup 1 year  . DVT (deep venous thrombosis)   . Syncope     + carotid sinus massage  . Pacemaker  Biotronik    Past Surgical History  Procedure Laterality Date  . Aortic valve replacement  1985    St. Jude  . Cardiac catheterization    . Vascular surgery     History   Social History  . Marital Status: Married    Spouse Name: N/A    Number of Children: N/A  . Years of Education: N/A   Occupational History  . Retired    Social History Main Topics  . Smoking status: Never Smoker   . Smokeless tobacco: Not on file  . Alcohol Use: No  . Drug Use: No  . Sexually Active: Not on file   Other Topics Concern  . Not on file   Social History Narrative  . No narrative on file   Family History  Problem Relation Age of Onset  . Coronary artery disease Other    Current Outpatient Prescriptions on File Prior to Visit  Medication  Sig Dispense Refill  . acyclovir (ZOVIRAX) 800 MG tablet Take one po 5 x daily for 7 days  35 tablet  1  . aspirin EC 81 MG tablet Take 1 tablet (81 mg total) by mouth daily.      Marland Kitchen COUMADIN 3 MG tablet TAKE 1 TABLET EVERY DAY AS DIRECTED BY COUMADIN CLINIC  90 tablet  0  . docusate sodium (COLACE) 100 MG capsule Take 100 mg by mouth daily.       Marland Kitchen ezetimibe-simvastatin (VYTORIN) 10-40 MG per tablet Take 1 tablet by mouth daily.        Marland Kitchen HYDROcodone-acetaminophen (NORCO) 5-325 MG per tablet Take 1 tablet by mouth every 6 (six) hours as needed for pain.  30 tablet  0  . latanoprost (XALATAN) 0.005 % ophthalmic solution Place 1 drop into the right eye at bedtime.      Marland Kitchen levothyroxine (SYNTHROID, LEVOTHROID) 75 MCG tablet Take 75 mcg by mouth daily.      . Multiple Vitamin (MULTIVITAMIN) tablet Take 1 tablet by mouth daily.        . Tamsulosin HCl (FLOMAX)  0.4 MG CAPS Take 0.4 mg by mouth 2 (two) times daily.        No current facility-administered medications on file prior to visit.   Allergies  Allergen Reactions  . Codeine   . Ivp Dye (Iodinated Diagnostic Agents)   . Sulfonamide Derivatives     There is no immunization history on file for this patient. Prior to Admission medications   Medication Sig Start Date End Date Taking? Authorizing Provider  acyclovir (ZOVIRAX) 800 MG tablet Take one po 5 x daily for 7 days 05/21/13   Deatra Canter, FNP  aspirin EC 81 MG tablet Take 1 tablet (81 mg total) by mouth daily. 06/08/11   Laurey Morale, MD  COUMADIN 3 MG tablet TAKE 1 TABLET EVERY DAY AS DIRECTED BY COUMADIN CLINIC 05/21/13   Ernestina Penna, MD  docusate sodium (COLACE) 100 MG capsule Take 100 mg by mouth daily.     Historical Provider, MD  ezetimibe-simvastatin (VYTORIN) 10-40 MG per tablet Take 1 tablet by mouth daily.      Historical Provider, MD  HYDROcodone-acetaminophen (NORCO) 5-325 MG per tablet Take 1 tablet by mouth every 6 (six) hours as needed for pain. 05/21/13   Deatra Canter, FNP  latanoprost (XALATAN) 0.005 % ophthalmic solution Place 1 drop into the right eye at bedtime. 05/01/12   Historical Provider, MD  levothyroxine (SYNTHROID, LEVOTHROID) 75 MCG tablet Take 75 mcg by mouth daily.    Historical Provider, MD  Multiple Vitamin (MULTIVITAMIN) tablet Take 1 tablet by mouth daily.      Historical Provider, MD  Tamsulosin HCl (FLOMAX) 0.4 MG CAPS Take 0.4 mg by mouth 2 (two) times daily.     Historical Provider, MD    ROS: As above in the HPI. All other systems are stable or negative.  OBJECTIVE: APPEARANCE:  Patient in no acute distress.The patient appeared well nourished and normally developed. Acyanotic. Waist: VITAL SIGNS:BP 112/63  Pulse 85  Temp(Src) 97.1 F (36.2 C) (Oral)  Wt 146 lb 6.4 oz (66.407 kg)  BMI 21.01 kg/m2 WM elderly  SKIN: warm and  Dry without overt rashes. The Zoster lesions have all healed. Sensitive to touch in the left parietal area.  HEAD and Neck: without JVD, Head and scalp: normal Eyes:No scleral icterus. Fundi normal, eye movements normal. Ears: Auricle normal, canal normal, Tympanic membranes normal, insufflation normal. Nose: normal Throat: normal Neck & thyroid: normal  CHEST & LUNGS: Chest wall: normal Lungs: Clear  CVS: Reveals the PMI to be normally located. Regular rhythm, First and Second Heart sounds are normal,  absence of murmurs, rubs or gallops. Peripheral vasculature: Radial pulses: normal Dorsal pedis pulses: normal Posterior pulses: normal  ABDOMEN:  Appearance: normal Benign, no organomegaly, no masses, no Abdominal Aortic enlargement. No Guarding , no rebound. No Bruits. Bowel sounds: normal  RECTAL: N/A GU: N/A  EXTREMETIES: nonedematous. .  ASSESSMENT: Herpes zoster  Resolved and stable with residual sensitivity  PLAN:  Complete the acyclovir. Return to clinic for  Routine follow ups and for the coumadin clinic as scheduled.  Return if symptoms worsen or fail to  improve.  Tenishia Ekman P. [place] Charon, M.D.

## 2013-06-10 ENCOUNTER — Ambulatory Visit (INDEPENDENT_AMBULATORY_CARE_PROVIDER_SITE_OTHER): Payer: Medicare Other | Admitting: Pharmacist Clinician (PhC)/ Clinical Pharmacy Specialist

## 2013-06-10 DIAGNOSIS — I4891 Unspecified atrial fibrillation: Secondary | ICD-10-CM

## 2013-06-10 DIAGNOSIS — Z954 Presence of other heart-valve replacement: Secondary | ICD-10-CM

## 2013-06-10 DIAGNOSIS — Z952 Presence of prosthetic heart valve: Secondary | ICD-10-CM

## 2013-06-18 ENCOUNTER — Other Ambulatory Visit: Payer: Self-pay

## 2013-07-07 ENCOUNTER — Encounter: Payer: Self-pay | Admitting: Family Medicine

## 2013-07-07 ENCOUNTER — Telehealth: Payer: Self-pay | Admitting: Family Medicine

## 2013-07-07 ENCOUNTER — Ambulatory Visit (INDEPENDENT_AMBULATORY_CARE_PROVIDER_SITE_OTHER): Payer: Medicare Other | Admitting: Family Medicine

## 2013-07-07 VITALS — BP 127/68 | HR 76 | Temp 97.3°F | Wt 145.8 lb

## 2013-07-07 DIAGNOSIS — Z7901 Long term (current) use of anticoagulants: Secondary | ICD-10-CM

## 2013-07-07 DIAGNOSIS — Z952 Presence of prosthetic heart valve: Secondary | ICD-10-CM

## 2013-07-07 DIAGNOSIS — Z954 Presence of other heart-valve replacement: Secondary | ICD-10-CM

## 2013-07-07 DIAGNOSIS — K2970 Gastritis, unspecified, without bleeding: Secondary | ICD-10-CM | POA: Insufficient documentation

## 2013-07-07 DIAGNOSIS — R195 Other fecal abnormalities: Secondary | ICD-10-CM | POA: Insufficient documentation

## 2013-07-07 LAB — POCT CBC
Granulocyte percent: 67.1 %G (ref 37–80)
HCT, POC: 39.4 % — AB (ref 43.5–53.7)
Hemoglobin: 13.7 g/dL — AB (ref 14.1–18.1)
Lymph, poc: 0.9 (ref 0.6–3.4)
MCH, POC: 33.7 pg — AB (ref 27–31.2)
MCHC: 34.7 g/dL (ref 31.8–35.4)
MCV: 97 fL (ref 80–97)
MPV: 7.2 fL (ref 0–99.8)
POC Granulocyte: 2.5 (ref 2–6.9)
POC LYMPH PERCENT: 23.6 %L (ref 10–50)
Platelet Count, POC: 142 10*3/uL (ref 142–424)
RBC: 4.1 M/uL — AB (ref 4.69–6.13)
RDW, POC: 13.9 %
WBC: 3.7 10*3/uL — AB (ref 4.6–10.2)

## 2013-07-07 LAB — POCT INR: INR: 1.9

## 2013-07-07 MED ORDER — PANTOPRAZOLE SODIUM 40 MG PO TBEC: 40.0000 mg | DELAYED_RELEASE_TABLET | Freq: Every day | ORAL | Status: DC

## 2013-07-07 NOTE — Telephone Encounter (Signed)
Noticed dark stools yesterday. Also c/o that tongue is white. He would like to see Dr. Modesto Charon. Appt scheduled for today.  Patient aware.

## 2013-07-07 NOTE — Progress Notes (Signed)
Patient ID: [name], male   DOB: 09-Dec-1927, 79 y.o.   MRN: [id number] SUBJECTIVE: CC: Chief Complaint  Patient presents with  . Follow-up    dark stools saying better now c/o white tongue.  pt is on coumadin    HPI: Looks better today. Some mild stomach hurting but that has resolved.ate at a restaurant and had upset stomach and  Diarrhea. The stools was dark. And his tongue looks pale. No dizziness. Better now. Came to get checked.   Past Medical History  Diagnosis Date  . Aortic stenosis     St.Jude AVR in 1985; Echo 8/12: EF 50-55%, mild LVH, mech AV with mild AI, mean gradient 14 mmHg, mild to mod MR;  Echo 8/13: EF 60-65%, AVR ok with mean gradient 19 mmHg  . CAD (coronary artery disease)     Mild, nonobstructive at catheterization 5/01  . Personal history of subdural hematoma     Requiring craniotomy  . History of pulmonary embolism     post op DVT/PE;  s/p IVC filter  . Hyperlipidemia   . Hypothyroidism   . S/P cholecystectomy 2005  . CVA (cerebral infarction) 12/2010  . S/P shoulder surgery     left  . Carotid stenosis     Dopplers 8/13: RICA 40-50% and LICA 0-39% => followup 1 year  . DVT (deep venous thrombosis)   . Syncope     + carotid sinus massage  . Pacemaker  Biotronik    Past Surgical History  Procedure Laterality Date  . Aortic valve replacement  1985    [facility]. Jude  . Cardiac catheterization    . Vascular surgery     History   Social History  . Marital Status: Married    Spouse Name: N/A    Number of Children: N/A  . Years of Education: N/A   Occupational History  . Retired    Social History Main Topics  . Smoking status: Never Smoker   . Smokeless tobacco: Not on file  . Alcohol Use: No  . Drug Use: No  . Sexual Activity: Not on file   Other Topics Concern  . Not on file   Social History Narrative  . No narrative on file   Family History  Problem Relation Age of Onset  . Coronary artery disease Other    Current Outpatient  Prescriptions on File Prior to Visit  Medication Sig Dispense Refill  . acyclovir (ZOVIRAX) 800 MG tablet Take one po 5 x daily for 7 days  35 tablet  1  . aspirin EC 81 MG tablet Take 1 tablet (81 mg total) by mouth daily.      Marland Kitchen COUMADIN 3 MG tablet TAKE 1 TABLET EVERY DAY AS DIRECTED BY COUMADIN CLINIC  90 tablet  0  . docusate sodium (COLACE) 100 MG capsule Take 100 mg by mouth daily.       Marland Kitchen ezetimibe-simvastatin (VYTORIN) 10-40 MG per tablet Take 1 tablet by mouth daily.        Marland Kitchen HYDROcodone-acetaminophen (NORCO) 5-325 MG per tablet Take 1 tablet by mouth every 6 (six) hours as needed for pain.  30 tablet  0  . latanoprost (XALATAN) 0.005 % ophthalmic solution Place 1 drop into the right eye at bedtime.      Marland Kitchen levothyroxine (SYNTHROID, LEVOTHROID) 75 MCG tablet Take 75 mcg by mouth daily.      . Multiple Vitamin (MULTIVITAMIN) tablet Take 1 tablet by mouth daily.        Marland Kitchen  Tamsulosin HCl (FLOMAX) 0.4 MG CAPS Take 0.4 mg by mouth 2 (two) times daily.        No current facility-administered medications on file prior to visit.   Allergies  Allergen Reactions  . Codeine   . Ivp Dye [Iodinated Diagnostic Agents]   . Sulfonamide Derivatives     There is no immunization history on file for this patient. Prior to Admission medications   Medication Sig Start Date End Date Taking? Authorizing Provider  acyclovir (ZOVIRAX) 800 MG tablet Take one po 5 x daily for 7 days 05/21/13  Yes Deatra Canter, FNP  aspirin EC 81 MG tablet Take 1 tablet (81 mg total) by mouth daily. 06/08/11  Yes Laurey Morale, MD  COUMADIN 3 MG tablet TAKE 1 TABLET EVERY DAY AS DIRECTED BY COUMADIN CLINIC 05/21/13  Yes Ernestina Penna, MD  docusate sodium (COLACE) 100 MG capsule Take 100 mg by mouth daily.    Yes Historical Provider, MD  ezetimibe-simvastatin (VYTORIN) 10-40 MG per tablet Take 1 tablet by mouth daily.     Yes Historical Provider, MD  HYDROcodone-acetaminophen (NORCO) 5-325 MG per tablet Take 1 tablet by  mouth every 6 (six) hours as needed for pain. 05/21/13  Yes Deatra Canter, FNP  latanoprost (XALATAN) 0.005 % ophthalmic solution Place 1 drop into the right eye at bedtime. 05/01/12  Yes Historical Provider, MD  levothyroxine (SYNTHROID, LEVOTHROID) 75 MCG tablet Take 75 mcg by mouth daily.   Yes Historical Provider, MD  Multiple Vitamin (MULTIVITAMIN) tablet Take 1 tablet by mouth daily.     Yes Historical Provider, MD  Tamsulosin HCl (FLOMAX) 0.4 MG CAPS Take 0.4 mg by mouth 2 (two) times daily.    Yes Historical Provider, MD     ROS: As above in the HPI. All other systems are stable or negative.  OBJECTIVE: APPEARANCE:  Patient in no acute distress.The patient appeared well nourished and normally developed. Acyanotic. Waist: VITAL SIGNS:BP 127/68  Pulse 76  Temp(Src) 97.3 F (36.3 C) (Oral)  Wt 145 lb 12.8 oz (66.134 kg)  BMI 20.92 kg/m2 WM elderly and frail  SKIN: warm and  Dry without overt rashes, tattoos and scars  HEAD and Neck: without JVD, Head and scalp: normal Eyes:No scleral icterus. Fundi normal, eye movements normal. Ears: Auricle normal, canal normal, Tympanic membranes normal, insufflation normal. Nose: normal Throat: normal Neck & thyroid: normal  CHEST & LUNGS: Chest wall: normal Lungs: Clear  CVS: Reveals the PMI to be normally located. Regular rhythm, First and Second Heart sounds are normal,  absence of murmurs, rubs or gallops. Peripheral vasculature: Radial pulses: normal Dorsal pedis pulses: normal Posterior pulses: normal  ABDOMEN:  Appearance: normal Benign, no organomegaly, no masses, no Abdominal Aortic enlargement. No Guarding , no rebound. No Bruits. Bowel sounds: normal  RECTAL: prostate 3 + enlarged. Heme negative brown stools. No masses  GU: N/A  EXTREMETIES: nonedematous.  MUSCULOSKELETAL:  Spine: normal Joints: intact  NEUROLOGIC: oriented to time,place and person; nonfocal. Strength is normal Sensory is  normal Reflexes are normal Cranial Nerves are normal.  Results for orders placed in visit on 07/07/13  POCT CBC      Result Value Range   WBC 3.7 (*) 4.6 - 10.2 K/uL   Lymph, poc 0.9  0.6 - 3.4   POC LYMPH PERCENT 23.6  10 - 50 %L   MID (cbc)    0 - 0.9   POC MID %    0 - 12 %M  POC Granulocyte 2.5  2 - 6.9   Granulocyte percent 67.1  37 - 80 %G   RBC 4.1 (*) 4.69 - 6.13 M/uL   Hemoglobin 13.7 (*) 14.1 - 18.1 g/dL   HCT, POC 59.5 (*) 63.8 - 53.7 %   MCV 97.0  80 - 97 fL   MCH, POC 33.7 (*) 27 - 31.2 pg   MCHC 34.7  31.8 - 35.4 g/dL   RDW, POC 75.6     Platelet Count, POC 142.0  142 - 424 K/uL   MPV 7.2  0 - 99.8 fL  POCT INR      Result Value Range   INR 1.9      ASSESSMENT: Dark stools - Plan: POCT CBC, POCT INR, pantoprazole (PROTONIX) 40 MG tablet  Unspecified gastritis and gastroduodenitis without mention of hemorrhage  Aortic valve replacement  Long term current use of anticoagulant  S/P AVR (aortic valve replacement)  PLAN: Orders Placed This Encounter  Procedures  . POCT CBC  . POCT INR    Meds ordered this encounter  Medications  . pantoprazole (PROTONIX) 40 MG tablet    Sig: Take 1 tablet (40 mg total) by mouth daily.    Dispense:  30 tablet    Refill:  3    Results for orders placed in visit on 07/07/13  POCT CBC      Result Value Range   WBC 3.7 (*) 4.6 - 10.2 K/uL   Lymph, poc 0.9  0.6 - 3.4   POC LYMPH PERCENT 23.6  10 - 50 %L   MID (cbc)    0 - 0.9   POC MID %    0 - 12 %M   POC Granulocyte 2.5  2 - 6.9   Granulocyte percent 67.1  37 - 80 %G   RBC 4.1 (*) 4.69 - 6.13 M/uL   Hemoglobin 13.7 (*) 14.1 - 18.1 g/dL   HCT, POC 43.3 (*) 29.5 - 53.7 %   MCV 97.0  80 - 97 fL   MCH, POC 33.7 (*) 27 - 31.2 pg   MCHC 34.7  31.8 - 35.4 g/dL   RDW, POC 18.8     Platelet Count, POC 142.0  142 - 424 K/uL   MPV 7.2  0 - 99.8 fL  POCT INR      Result Value Range   INR 1.9     Reassurance, observe.  Return if symptoms worsen or fail to  improve.  George Haggart P. [place] Charon, M.D.

## 2013-07-22 ENCOUNTER — Ambulatory Visit (INDEPENDENT_AMBULATORY_CARE_PROVIDER_SITE_OTHER): Payer: Medicare Other | Admitting: Pharmacist Clinician (PhC)/ Clinical Pharmacy Specialist

## 2013-07-22 ENCOUNTER — Encounter: Payer: Medicare Other | Admitting: Internal Medicine

## 2013-07-22 DIAGNOSIS — Z952 Presence of prosthetic heart valve: Secondary | ICD-10-CM

## 2013-07-22 DIAGNOSIS — I4891 Unspecified atrial fibrillation: Secondary | ICD-10-CM

## 2013-07-22 DIAGNOSIS — Z954 Presence of other heart-valve replacement: Secondary | ICD-10-CM

## 2013-07-22 LAB — POCT INR: INR: 2

## 2013-08-05 ENCOUNTER — Encounter: Payer: Self-pay | Admitting: Internal Medicine

## 2013-08-05 ENCOUNTER — Encounter (INDEPENDENT_AMBULATORY_CARE_PROVIDER_SITE_OTHER): Payer: Medicare Other

## 2013-08-05 ENCOUNTER — Ambulatory Visit (INDEPENDENT_AMBULATORY_CARE_PROVIDER_SITE_OTHER): Payer: Medicare Other | Admitting: Internal Medicine

## 2013-08-05 VITALS — BP 136/71 | HR 68 | Ht 70.0 in | Wt 147.6 lb

## 2013-08-05 DIAGNOSIS — I6529 Occlusion and stenosis of unspecified carotid artery: Secondary | ICD-10-CM

## 2013-08-05 DIAGNOSIS — Z950 Presence of cardiac pacemaker: Secondary | ICD-10-CM

## 2013-08-05 DIAGNOSIS — I472 Ventricular tachycardia: Secondary | ICD-10-CM

## 2013-08-05 DIAGNOSIS — G9001 Carotid sinus syncope: Secondary | ICD-10-CM

## 2013-08-05 DIAGNOSIS — I495 Sick sinus syndrome: Secondary | ICD-10-CM

## 2013-08-05 DIAGNOSIS — I6523 Occlusion and stenosis of bilateral carotid arteries: Secondary | ICD-10-CM

## 2013-08-05 LAB — PACEMAKER DEVICE OBSERVATION
AL IMPEDENCE PM: 448 Ohm
AL THRESHOLD: 0.7 V
RV LEAD AMPLITUDE: 14.8 mv
RV LEAD IMPEDENCE PM: 526 Ohm
RV LEAD THRESHOLD: 0.6 V

## 2013-08-05 NOTE — Assessment & Plan Note (Signed)
No recurrent syncope 

## 2013-08-05 NOTE — Progress Notes (Signed)
Patient Care Team: Ileana Ladd, MD as PCP - General (Family Medicine)   HPI  [name] is a 79 y.o. male Seen in followup for carotid sinus hypersensitivity and syncope. He is status post pacemaker-Biotronik implantation.  He also has a history of mechanical aortic valve replacement with a subdural hematoma requiring craniotomy 2004. He has been on Coumadin in following a stroke in 2012 aspirin was added to his regime, appropriate   in the context of his valve  Echo EF normal  He has some orthostatic dizziness butno other lightheadedness  Past Medical History  Diagnosis Date  . Aortic stenosis     St.Jude AVR in 1985; Echo 8/12: EF 50-55%, mild LVH, mech AV with mild AI, mean gradient 14 mmHg, mild to mod MR;  Echo 8/13: EF 60-65%, AVR ok with mean gradient 19 mmHg  . CAD (coronary artery disease)     Mild, nonobstructive at catheterization 5/01  . Personal history of subdural hematoma     Requiring craniotomy  . History of pulmonary embolism     post op DVT/PE;  s/p IVC filter  . Hyperlipidemia   . Hypothyroidism   . S/P cholecystectomy 2005  . CVA (cerebral infarction) 12/2010  . S/P shoulder surgery     left  . Carotid stenosis     Dopplers 8/13: RICA 40-50% and LICA 0-39% => followup 1 year  . DVT (deep venous thrombosis)   . Syncope     + carotid sinus massage  . Pacemaker  Biotronik     Past Surgical History  Procedure Laterality Date  . Aortic valve replacement  1985    [facility]. Jude  . Cardiac catheterization    . Vascular surgery      Current Outpatient Prescriptions  Medication Sig Dispense Refill  . aspirin EC 81 MG tablet Take 1 tablet (81 mg total) by mouth daily.      Marland Kitchen COUMADIN 3 MG tablet TAKE 1 TABLET EVERY DAY AS DIRECTED BY COUMADIN CLINIC  90 tablet  0  . docusate sodium (COLACE) 100 MG capsule Take 100 mg by mouth daily.       Marland Kitchen ezetimibe-simvastatin (VYTORIN) 10-40 MG per tablet Take 1 tablet by mouth daily.        Marland Kitchen  HYDROcodone-acetaminophen (NORCO) 5-325 MG per tablet Take 1 tablet by mouth every 6 (six) hours as needed for pain.  30 tablet  0  . latanoprost (XALATAN) 0.005 % ophthalmic solution Place 1 drop into the right eye at bedtime.      Marland Kitchen levothyroxine (SYNTHROID, LEVOTHROID) 75 MCG tablet Take 75 mcg by mouth daily.      . Multiple Vitamin (MULTIVITAMIN) tablet Take 1 tablet by mouth daily.        . pantoprazole (PROTONIX) 40 MG tablet Take 1 tablet (40 mg total) by mouth daily.  30 tablet  3  . Tamsulosin HCl (FLOMAX) 0.4 MG CAPS Take 0.4 mg by mouth 2 (two) times daily.        No current facility-administered medications for this visit.    Allergies  Allergen Reactions  . Codeine   . Ivp Dye [Iodinated Diagnostic Agents]   . Sulfonamide Derivatives     Review of Systems negative except from HPI and PMH  Physical Exam BP 136/71  Pulse 68  Ht 5\' 10"  (1.778 m)  Wt 147 lb 9.6 oz (66.951 kg)  BMI 21.18 kg/m2 Well developed and well nourished in no acute distress HENT normal E scleral  and icterus clear Neck Supple JVP flat; carotids brisk and full Clear to ausculation  Regular rate and rhythm, mechanical S2 wearly systolic murmur Soft with active bowel sounds No clubbing cyanosis no Edema Alert and oriented, grossly normal motor and sensory function Skin Warm and Dry  ECG demonstrates sinus rhythm at 60 intervals 23/11/38  Assessment and  Plan

## 2013-08-05 NOTE — Assessment & Plan Note (Signed)
Newly identified; no clearly symptomatic. In the context of normal left ventricular function will not pursue this at this time

## 2013-08-05 NOTE — Assessment & Plan Note (Signed)
70% atrial pacing Stable post pacing

## 2013-08-05 NOTE — Patient Instructions (Addendum)
Your physician wants you to follow-up in: 6 MONTHS WITH DR KLEIN You will receive a reminder letter in the mail two months in advance. If you don't receive a letter, please call our office to schedule the follow-up appointment.  

## 2013-08-05 NOTE — Assessment & Plan Note (Signed)
The patient's device was interrogated.  The information was reviewed. No changes were made in the programming.    

## 2013-08-25 ENCOUNTER — Ambulatory Visit (INDEPENDENT_AMBULATORY_CARE_PROVIDER_SITE_OTHER): Payer: Medicare Other

## 2013-08-25 DIAGNOSIS — Z23 Encounter for immunization: Secondary | ICD-10-CM

## 2013-09-02 ENCOUNTER — Ambulatory Visit (INDEPENDENT_AMBULATORY_CARE_PROVIDER_SITE_OTHER): Payer: Medicare Other | Admitting: Pharmacist Clinician (PhC)/ Clinical Pharmacy Specialist

## 2013-09-02 DIAGNOSIS — I4891 Unspecified atrial fibrillation: Secondary | ICD-10-CM

## 2013-09-02 DIAGNOSIS — Z952 Presence of prosthetic heart valve: Secondary | ICD-10-CM

## 2013-09-02 DIAGNOSIS — Z954 Presence of other heart-valve replacement: Secondary | ICD-10-CM

## 2013-09-16 ENCOUNTER — Other Ambulatory Visit: Payer: Self-pay

## 2013-09-16 DIAGNOSIS — R195 Other fecal abnormalities: Secondary | ICD-10-CM

## 2013-09-16 MED ORDER — PANTOPRAZOLE SODIUM 40 MG PO TBEC: 40.0000 mg | DELAYED_RELEASE_TABLET | Freq: Every day | ORAL | Status: DC

## 2013-10-14 ENCOUNTER — Ambulatory Visit (INDEPENDENT_AMBULATORY_CARE_PROVIDER_SITE_OTHER): Payer: Medicare Other | Admitting: Pharmacist Clinician (PhC)/ Clinical Pharmacy Specialist

## 2013-10-14 DIAGNOSIS — Z954 Presence of other heart-valve replacement: Secondary | ICD-10-CM

## 2013-10-14 DIAGNOSIS — I4891 Unspecified atrial fibrillation: Secondary | ICD-10-CM

## 2013-10-14 DIAGNOSIS — Z952 Presence of prosthetic heart valve: Secondary | ICD-10-CM

## 2013-11-17 ENCOUNTER — Ambulatory Visit (INDEPENDENT_AMBULATORY_CARE_PROVIDER_SITE_OTHER): Payer: Medicare HMO | Admitting: Pharmacist

## 2013-11-17 DIAGNOSIS — I4891 Unspecified atrial fibrillation: Secondary | ICD-10-CM

## 2013-11-17 DIAGNOSIS — Z952 Presence of prosthetic heart valve: Secondary | ICD-10-CM

## 2013-11-17 DIAGNOSIS — Z954 Presence of other heart-valve replacement: Secondary | ICD-10-CM

## 2013-11-17 LAB — POCT INR: INR: 2.3

## 2013-11-17 NOTE — Patient Instructions (Signed)
Anticoagulation Dose Instructions as of 11/17/2013     [name] Tue Wed Thu Fri Sat   New Dose 3 mg 1.5 mg 3 mg 3 mg 3 mg 1.5 mg 3 mg    Description       * Goal 2.0-2.5 per cardiologist* Continue 1/2 tablet on Mondays and Fridays and 1 tablet all other days.      INR was 2.3 today

## 2013-12-16 ENCOUNTER — Other Ambulatory Visit: Payer: Self-pay | Admitting: Family Medicine

## 2013-12-17 ENCOUNTER — Encounter: Payer: Self-pay | Admitting: *Deleted

## 2013-12-31 ENCOUNTER — Other Ambulatory Visit: Payer: Self-pay | Admitting: *Deleted

## 2013-12-31 ENCOUNTER — Ambulatory Visit (INDEPENDENT_AMBULATORY_CARE_PROVIDER_SITE_OTHER): Payer: Medicare HMO | Admitting: Pharmacist

## 2013-12-31 DIAGNOSIS — Z954 Presence of other heart-valve replacement: Secondary | ICD-10-CM

## 2013-12-31 DIAGNOSIS — R195 Other fecal abnormalities: Secondary | ICD-10-CM

## 2013-12-31 DIAGNOSIS — I4891 Unspecified atrial fibrillation: Secondary | ICD-10-CM

## 2013-12-31 DIAGNOSIS — Z952 Presence of prosthetic heart valve: Secondary | ICD-10-CM

## 2013-12-31 LAB — POCT INR: INR: 2.3

## 2013-12-31 MED ORDER — TAMSULOSIN HCL 0.4 MG PO CAPS: 0.4000 mg | ORAL_CAPSULE | Freq: Two times a day (BID) | ORAL | Status: DC

## 2013-12-31 MED ORDER — LATANOPROST 0.005 % OP SOLN: 1.0000 [drp] | Freq: Every day | OPHTHALMIC | Status: DC

## 2013-12-31 MED ORDER — PANTOPRAZOLE SODIUM 40 MG PO TBEC: 40.0000 mg | DELAYED_RELEASE_TABLET | Freq: Every day | ORAL | Status: DC

## 2013-12-31 MED ORDER — LEVOTHYROXINE SODIUM 75 MCG PO TABS: 75.0000 ug | ORAL_TABLET | Freq: Every day | ORAL | Status: DC

## 2013-12-31 NOTE — Patient Instructions (Signed)
Anticoagulation Dose Instructions as of 12/31/2013     [name] Tue Wed Thu Fri Sat   New Dose 3 mg 1.5 mg 3 mg 3 mg 3 mg 1.5 mg 3 mg    Description       * Goal 2.0-2.5 per cardiologist* Continue 1/2 tablet on Mondays and Fridays and 1 tablet all other days.      INR was 2.3 today

## 2014-01-04 ENCOUNTER — Other Ambulatory Visit: Payer: Self-pay | Admitting: Family Medicine

## 2014-02-09 ENCOUNTER — Encounter (HOSPITAL_COMMUNITY): Payer: Self-pay | Admitting: Emergency Medicine

## 2014-02-09 ENCOUNTER — Emergency Department (HOSPITAL_COMMUNITY)
Admission: EM | Admit: 2014-02-09 | Discharge: 2014-02-09 | Disposition: A | Discharge status: Home / Self Care | Payer: Medicare HMO | Attending: Emergency Medicine | Admitting: Emergency Medicine

## 2014-02-09 ENCOUNTER — Emergency Department (HOSPITAL_COMMUNITY): Payer: Medicare HMO

## 2014-02-09 DIAGNOSIS — Z950 Presence of cardiac pacemaker: Secondary | ICD-10-CM | POA: Insufficient documentation

## 2014-02-09 DIAGNOSIS — Z7982 Long term (current) use of aspirin: Secondary | ICD-10-CM | POA: Insufficient documentation

## 2014-02-09 DIAGNOSIS — Z9089 Acquired absence of other organs: Secondary | ICD-10-CM | POA: Insufficient documentation

## 2014-02-09 DIAGNOSIS — Z8673 Personal history of transient ischemic attack (TIA), and cerebral infarction without residual deficits: Secondary | ICD-10-CM | POA: Insufficient documentation

## 2014-02-09 DIAGNOSIS — Z7901 Long term (current) use of anticoagulants: Secondary | ICD-10-CM | POA: Insufficient documentation

## 2014-02-09 DIAGNOSIS — E785 Hyperlipidemia, unspecified: Secondary | ICD-10-CM | POA: Insufficient documentation

## 2014-02-09 DIAGNOSIS — Z87828 Personal history of other (healed) physical injury and trauma: Secondary | ICD-10-CM | POA: Insufficient documentation

## 2014-02-09 DIAGNOSIS — H81399 Other peripheral vertigo, unspecified ear: Secondary | ICD-10-CM

## 2014-02-09 DIAGNOSIS — Z79899 Other long term (current) drug therapy: Secondary | ICD-10-CM | POA: Insufficient documentation

## 2014-02-09 DIAGNOSIS — E039 Hypothyroidism, unspecified: Secondary | ICD-10-CM | POA: Insufficient documentation

## 2014-02-09 DIAGNOSIS — Z9889 Other specified postprocedural states: Secondary | ICD-10-CM | POA: Insufficient documentation

## 2014-02-09 DIAGNOSIS — Z954 Presence of other heart-valve replacement: Secondary | ICD-10-CM | POA: Insufficient documentation

## 2014-02-09 DIAGNOSIS — Z86718 Personal history of other venous thrombosis and embolism: Secondary | ICD-10-CM | POA: Insufficient documentation

## 2014-02-09 DIAGNOSIS — I2510 Atherosclerotic heart disease of native coronary artery without angina pectoris: Secondary | ICD-10-CM | POA: Insufficient documentation

## 2014-02-09 DIAGNOSIS — Z86711 Personal history of pulmonary embolism: Secondary | ICD-10-CM | POA: Insufficient documentation

## 2014-02-09 LAB — I-STAT TROPONIN, ED
Troponin i, poc: 0 ng/mL (ref 0.00–0.08)
Troponin i, poc: 0.01 ng/mL (ref 0.00–0.08)

## 2014-02-09 LAB — URINALYSIS, ROUTINE W REFLEX MICROSCOPIC
Bilirubin Urine: NEGATIVE
Glucose, UA: NEGATIVE mg/dL
Hgb urine dipstick: NEGATIVE
KETONES UR: NEGATIVE mg/dL
LEUKOCYTES UA: NEGATIVE
NITRITE: NEGATIVE
PH: 7.5 (ref 5.0–8.0)
PROTEIN: NEGATIVE mg/dL
Specific Gravity, Urine: 1.015 (ref 1.005–1.030)
UROBILINOGEN UA: 1 mg/dL (ref 0.0–1.0)

## 2014-02-09 LAB — PROTIME-INR
INR: 2.31 — ABNORMAL HIGH (ref 0.00–1.49)
PROTHROMBIN TIME: 24.6 s — AB (ref 11.6–15.2)

## 2014-02-09 LAB — CBC WITH DIFFERENTIAL/PLATELET
Basophils Absolute: 0 10*3/uL (ref 0.0–0.1)
Basophils Relative: 0 % (ref 0–1)
Eosinophils Absolute: 0.1 10*3/uL (ref 0.0–0.7)
Eosinophils Relative: 1 % (ref 0–5)
HEMATOCRIT: 36.9 % — AB (ref 39.0–52.0)
Hemoglobin: 12.6 g/dL — ABNORMAL LOW (ref 13.0–17.0)
LYMPHS ABS: 0.4 10*3/uL — AB (ref 0.7–4.0)
LYMPHS PCT: 11 % — AB (ref 12–46)
MCH: 33.1 pg (ref 26.0–34.0)
MCHC: 34.1 g/dL (ref 30.0–36.0)
MCV: 96.9 fL (ref 78.0–100.0)
MONO ABS: 0.4 10*3/uL (ref 0.1–1.0)
Monocytes Relative: 9 % (ref 3–12)
Neutro Abs: 2.9 10*3/uL (ref 1.7–7.7)
Neutrophils Relative %: 79 % — ABNORMAL HIGH (ref 43–77)
PLATELETS: 158 10*3/uL (ref 150–400)
RBC: 3.81 MIL/uL — AB (ref 4.22–5.81)
RDW: 12.4 % (ref 11.5–15.5)
WBC: 3.8 10*3/uL — AB (ref 4.0–10.5)

## 2014-02-09 LAB — COMPREHENSIVE METABOLIC PANEL
ALT: 10 U/L (ref 0–53)
AST: 21 U/L (ref 0–37)
Albumin: 3.5 g/dL (ref 3.5–5.2)
Alkaline Phosphatase: 90 U/L (ref 39–117)
BILIRUBIN TOTAL: 0.8 mg/dL (ref 0.3–1.2)
BUN: 13 mg/dL (ref 6–23)
CO2: 25 meq/L (ref 19–32)
Calcium: 8.9 mg/dL (ref 8.4–10.5)
Chloride: 103 mEq/L (ref 96–112)
Creatinine, Ser: 1.08 mg/dL (ref 0.50–1.35)
GFR calc Af Amer: 71 mL/min — ABNORMAL LOW (ref >90)
GFR, EST NON AFRICAN AMERICAN: 61 mL/min — AB (ref >90)
GLUCOSE: 100 mg/dL — AB (ref 70–99)
Potassium: 4.4 mEq/L (ref 3.7–5.3)
SODIUM: 140 meq/L (ref 137–147)
Total Protein: 7.4 g/dL (ref 6.0–8.3)

## 2014-02-09 MED ORDER — MECLIZINE HCL 50 MG PO TABS: 50.0000 mg | ORAL_TABLET | Freq: Three times a day (TID) | ORAL | Status: DC | PRN

## 2014-02-09 MED ORDER — ONDANSETRON HCL 4 MG/2ML IJ SOLN
4.0000 mg | Freq: Once | INTRAMUSCULAR | Status: AC
  Administered 2014-02-09: 4 mg via INTRAVENOUS
  Filled 2014-02-09: qty 2

## 2014-02-09 MED ORDER — METOPROLOL TARTRATE 25 MG PO TABS: 25.0000 mg | ORAL_TABLET | Freq: Two times a day (BID) | ORAL | Status: DC

## 2014-02-09 NOTE — ED Notes (Signed)
Hannah PA at bedside

## 2014-02-09 NOTE — ED Notes (Signed)
Pt in radiology 

## 2014-02-09 NOTE — ED Notes (Signed)
Pt alert x4 respirations easy non labored. Denies complaints at this time

## 2014-02-09 NOTE — ED Notes (Signed)
Pt arrived via EMS related to near syncopal episode. Pt denies c/p, sob or HA. Pt states sudden onset of nausea, dizziness, "hot" and clammy. Alert x4, MAE randomly. Skin w/d at this time.

## 2014-02-09 NOTE — Discharge Instructions (Signed)
Your exam shows you have had an episode of vertigo, which causes a false sense of movement such as a spinning feeling or walls that seem to move.  Most vertigo is caused by a (usually temporary) problem in the inner ear. Rarely, the back part of the brain can cause vertigo (some mini-strokes / TIA's / strokes), but it appears to be a low risk cause for you at this time. It is important to follow-up with your doctor however, to see if you need further testing.  °Do not drive or participate in potentially dangerous activities requiring balance unless off meds (not drowsy) and the vertigo has resolved. °Most of the time benign vertigo is much better after a few days. However, mild unsteadiness may last for up to 3 months in some patients. An MRI scan or other special tests to evaluate your hearing and balance may be needed if the vertigo does not improve or returns in the future. RETURN IMMEDIATELY IF YOU HAVE ANY OF THE FOLLOWING (call 911): °Increasing vertigo, earache, ear drainage, or loss of hearing.  °Severe headache, blurred or double vision, or trouble walking.  °Fainting or poorly responsive, extreme weakness, chest pain, or palpitations.  °Fever, persistent vomiting, or dehydration.  °Numbness, tingling, incoordination, or weakness of the limbs.  °Change in speech, vision, swallowing, understanding, or other concerns. °

## 2014-02-09 NOTE — ED Notes (Signed)
Pt denies complaints at this time

## 2014-02-09 NOTE — ED Provider Notes (Signed)
CSN: [id number]     Arrival date & time 02/09/14  1117 History   First MD Initiated Contact with Patient 02/09/14 1136     Chief Complaint  Patient presents with  . Near Syncope     (Consider location/radiation/quality/duration/timing/severity/associated sxs/prior Treatment) HPI Comments: Patient is an 80 year old male with history of aortic stenosis, CAD, subdural hematoma, PE, hyperlipidemia, hypothyroidism, CVA, carotid stenosis, DVT, syncope, and Biotronik pacemaker who presents today after a near syncopal episode. He reports that prior to arrival he had an episode of diaphoresis which lasted approximately 5-10 minutes. He was associated with nausea and one episode of vomiting. He has had persistent nausea since that time. He reports feeling dizzy during this time which reminded him of his vertigo that he has had in the past. The symptoms were triggered by sudden movement of his head. He was bending over to pick something up and then reached up to grab a sock above his head. He denies any difficulty with speech during this episode. He denies residual deficit from prior stroke.   The history is provided by the patient. No language interpreter was used.    Past Medical History  Diagnosis Date  . Aortic stenosis     St.Jude AVR in 1985; Echo 8/12: EF 50-55%, mild LVH, mech AV with mild AI, mean gradient 14 mmHg, mild to mod MR;  Echo 8/13: EF 60-65%, AVR ok with mean gradient 19 mmHg  . CAD (coronary artery disease)     Mild, nonobstructive at catheterization 5/01  . Personal history of subdural hematoma     Requiring craniotomy  . History of pulmonary embolism     post op DVT/PE;  s/p IVC filter  . Hyperlipidemia   . Hypothyroidism   . S/P cholecystectomy 2005  . CVA (cerebral infarction) 12/2010  . S/P shoulder surgery     left  . Carotid stenosis     Dopplers 8/13: RICA 40-50% and LICA 0-39% => followup 1 year  . DVT (deep venous thrombosis)   . Syncope     + carotid sinus  massage  . Pacemaker  Biotronik    Past Surgical History  Procedure Laterality Date  . Aortic valve replacement  1985    St. Jude  . Cardiac catheterization    . Vascular surgery    . Brain surgery     Family History  Problem Relation Age of Onset  . Coronary artery disease Other    History  Substance Use Topics  . Smoking status: Never Smoker   . Smokeless tobacco: Not on file  . Alcohol Use: No    Review of Systems  Constitutional: Negative for fever and chills.  Eyes: Negative for photophobia and visual disturbance.  Respiratory: Negative for shortness of breath.   Cardiovascular: Negative for chest pain.  Gastrointestinal: Positive for nausea and vomiting. Negative for abdominal pain.  Neurological: Positive for dizziness.  All other systems reviewed and are negative.      Allergies  Codeine; Ivp dye; and Sulfonamide derivatives  Home Medications   Current Outpatient Rx  Name  Route  Sig  Dispense  Refill  . aspirin EC 81 MG tablet   Oral   Take 1 tablet (81 mg total) by mouth daily.         Marland Kitchen COUMADIN 3 MG tablet      TAKE 1 TABLET EVERY DAY AS DIRECTED BY COUMADIN CLINIC   90 tablet   0     Dispense as  written.   . docusate sodium (COLACE) 100 MG capsule   Oral   Take 100 mg by mouth daily.          Marland Kitchen ezetimibe-simvastatin (VYTORIN) 10-40 MG per tablet   Oral   Take 1 tablet by mouth daily.           Marland Kitchen HYDROcodone-acetaminophen (NORCO) 5-325 MG per tablet   Oral   Take 1 tablet by mouth every 6 (six) hours as needed for pain.   30 tablet   0   . latanoprost (XALATAN) 0.005 % ophthalmic solution   Right Eye   Place 1 drop into the right eye at bedtime.   7.5 mL   0   . levothyroxine (SYNTHROID, LEVOTHROID) 75 MCG tablet   Oral   Take 1 tablet (75 mcg total) by mouth daily.   90 tablet   0   . Multiple Vitamin (MULTIVITAMIN) tablet   Oral   Take 1 tablet by mouth daily.           . pantoprazole (PROTONIX) 40 MG tablet    Oral   Take 1 tablet (40 mg total) by mouth daily.   90 tablet   0   . tamsulosin (FLOMAX) 0.4 MG CAPS capsule   Oral   Take 1 capsule (0.4 mg total) by mouth 2 (two) times daily.   180 capsule   0    BP 135/72  Pulse 90  Temp(Src) 97.9 F (36.6 C) (Oral)  Resp 15  SpO2 96% Physical Exam  Nursing note and vitals reviewed. Constitutional: He is oriented to person, place, and time. He appears well-developed and well-nourished. No distress.  HENT:  Head: Normocephalic and atraumatic.  Right Ear: External ear normal.  Left Ear: External ear normal.  Nose: Nose normal.  Eyes: Conjunctivae and EOM are normal. Right eye exhibits no nystagmus. Left eye exhibits no nystagmus. Right pupil is not reactive. Pupils are unequal.  Hx of glaucoma in right eye  Neck: Normal range of motion. No tracheal deviation present.  Cardiovascular: Normal rate, regular rhythm, normal heart sounds, intact distal pulses and normal pulses.   Pulses:      Radial pulses are 2+ on the right side, and 2+ on the left side.       Posterior tibial pulses are 2+ on the right side, and 2+ on the left side.  Pulmonary/Chest: Effort normal and breath sounds normal. No stridor.  Abdominal: Soft. He exhibits no distension. There is no tenderness.  Musculoskeletal: Normal range of motion.  Neurological: He is alert and oriented to person, place, and time. He has normal strength. Coordination and gait normal.  Finger nose finger normal. Grip strength 5/5 bilaterally. Gait is normal with antalgia or ataxia.  No facial droop.  Strength 5/5 in all extremities.   Skin: Skin is warm and dry. He is not diaphoretic.  Psychiatric: He has a normal mood and affect. His behavior is normal.    ED Course  Procedures (including critical care time) Labs Review Labs Reviewed  CBC WITH DIFFERENTIAL - Abnormal; Notable for the following:    WBC 3.8 (*)    RBC 3.81 (*)    Hemoglobin 12.6 (*)    HCT 36.9 (*)    Neutrophils  Relative % 79 (*)    Lymphocytes Relative 11 (*)    Lymphs Abs 0.4 (*)    All other components within normal limits  COMPREHENSIVE METABOLIC PANEL - Abnormal; Notable for the following:  Glucose, Bld 100 (*)    GFR calc non Af Amer 61 (*)    GFR calc Af Amer 71 (*)    All other components within normal limits  PROTIME-INR - Abnormal; Notable for the following:    Prothrombin Time 24.6 (*)    INR 2.31 (*)    All other components within normal limits  URINE CULTURE  URINALYSIS, ROUTINE W REFLEX MICROSCOPIC  I-STAT TROPOININ, ED  Rosezena SensorI-STAT TROPOININ, ED   Imaging Review Dg Chest 2 View  02/09/2014   CLINICAL DATA:  Near syncope.  Nausea and vomiting.  EXAM: CHEST  2 VIEW  COMPARISON:  PA and lateral chest 08/02/2012.  FINDINGS: The patient is status post CABG with a pacing device in place. Lungs are clear. Heart size is normal. No pneumothorax or pleural effusion.  IMPRESSION: No acute disease.   Electronically Signed   By: Drusilla Kannerhomas  Dalessio M.D.   On: 02/09/2014 13:19   Ct Head Wo Contrast  02/09/2014   CLINICAL DATA:  Severe headache  EXAM: CT HEAD WITHOUT CONTRAST  TECHNIQUE: Contiguous axial images were obtained from the base of the skull through the vertex without intravenous contrast.  COMPARISON:  CT 07/21/2004, MRI 06/18/2012  FINDINGS: Generalized atrophy. Negative for hydrocephalus. Chronic ischemia in the frontal white matter bilaterally, left greater than right. This extends into the left external capsule. This has progressed since the prior MRI.  Negative for acute infarct. Negative for acute hemorrhage. Note is made of chronic micro hemorrhage in the brain on the prior MRI. There is a calcification in the left paracentral pons which has progressed since 2005. This may be related to dystrophic calcification, prior infection, or possibly a vascular malformation. This area is not apparent on the MRI which does not show evidence of cavernoma. This calcification shows mild progression  since the CT in 2005. This is felt to be a benign calcification.  Bifrontal craniotomy.  IMPRESSION: Atrophy and chronic microvascular ischemia with some progression since 2013. No acute abnormality   Electronically Signed   By: Marlan Palauharles  Clark M.D.   On: 02/09/2014 13:32     EKG Interpretation   Date/Time:  Monday February 09 2014 11:29:05 EDT Ventricular Rate:  72 PR Interval:  219 QRS Duration: 104 QT Interval:  377 QTC Calculation: 412 R Axis:   -25 Text Interpretation:  Sinus rhythm Borderline prolonged PR interval  Borderline left axis deviation Compared to previous tracing Atrial-paced  rhythm NO LONGER PRESENT Confirmed by Fonnie JarvisBEDNAR  MD, Jonny RuizJOHN ([id number]) on  02/09/2014 11:45:48 AM      MDM   Final diagnoses:  Peripheral vertigo   Patient presents to ED with symptoms consistent with peripheral vertigo. No recurrence of sx here. Patient did undergo head ct which showed no acute pathology. EKG is unchanged from prior. Chest XR shows no acute disease. Labwork is unremarkable. No concern for central etiology of vertigo. Pacemaker was interrogated which shows no events today. Cardiology reviewed the case and pacemaker interrogation. They recommend addition lopressor for patient. Patient is ok to be discharged home. Dr. Fonnie JarvisBednar evaluated patient and agrees with plan. Specific return instructions given. Vital signs stable for discharge. Patient / Family / Caregiver informed of clinical course, understand medical decision-making process, and agree with plan.     Mora BellmanHannah S Abriel Hattery, PA-C 02/09/14 1900

## 2014-02-09 NOTE — ED Notes (Signed)
Pt return from ct scan. Pt denies complaints at this time. Dr bednar at bedside.

## 2014-02-09 NOTE — ED Notes (Signed)
Charge nurse at bedside to interrogate pacemaker 

## 2014-02-09 NOTE — ED Provider Notes (Signed)
Medical screening examination/treatment/procedure(s) were conducted as a shared visit with non-physician practitioner(s) and myself.  I personally evaluated the patient during the encounter.   EKG Interpretation   Date/Time:  Monday February 09 2014 11:29:05 EDT Ventricular Rate:  72 PR Interval:  219 QRS Duration: 104 QT Interval:  377 QTC Calculation: 412 R Axis:   -25 Text Interpretation:  Sinus rhythm Borderline prolonged PR interval  Borderline left axis deviation Compared to previous tracing Atrial-paced  rhythm NO LONGER PRESENT Confirmed by Fonnie Jarvis  MD, Jonny Ruiz ([id number]) on  02/09/2014 11:45:31 AM     80 year old male with history of vertigo in the past and over and pick up his shoes from a closet floor then reached up and turned to grab his socks which were hanging up higher he had sudden onset of vertigo worse with position changes with no headache but he did have a feeling of warmth nausea sweats and vomiting with no change in speech vision swallowing or understanding and no focal or lateralizing weakness or numbness but he did feel uncoordinated and could not walk straight and had to hang onto objects because he felt as if there was a spinning sensation he could not walk straight. The symptoms have resolved completely and he is now able to walk normally. On examination he has baseline poor vision in right eye with baseline abnormal right pupil from prior surgery and glaucoma no change in vision today. Extraocular movements intact peripheral visual fields baseline to confrontation with no nystagmus and negative test of skew. No facial asymmetry normal facial light touch normal light touch to arms and legs no pronator drift and gait is normal.  Doubt central vertigo/CVA. No dysrhythmia per pacer check.  Hurman Horn, MD 02/11/14 [phone]

## 2014-02-09 NOTE — ED Notes (Signed)
Pt ambulates without assistance steady gait noted.. Denies complaints with ambulation.

## 2014-02-10 LAB — URINE CULTURE
CULTURE: NO GROWTH
Colony Count: NO GROWTH

## 2014-02-11 ENCOUNTER — Telehealth: Payer: Self-pay | Admitting: Family Medicine

## 2014-02-11 NOTE — Telephone Encounter (Signed)
appt made

## 2014-02-16 ENCOUNTER — Encounter: Payer: Self-pay | Admitting: Family Medicine

## 2014-02-16 ENCOUNTER — Ambulatory Visit (INDEPENDENT_AMBULATORY_CARE_PROVIDER_SITE_OTHER): Payer: Medicare HMO

## 2014-02-16 ENCOUNTER — Ambulatory Visit (INDEPENDENT_AMBULATORY_CARE_PROVIDER_SITE_OTHER): Payer: Medicare HMO | Admitting: Family Medicine

## 2014-02-16 VITALS — BP 120/69 | HR 72 | Temp 98.0°F | Ht 70.0 in | Wt 148.4 lb

## 2014-02-16 DIAGNOSIS — I472 Ventricular tachycardia: Secondary | ICD-10-CM

## 2014-02-16 DIAGNOSIS — Z954 Presence of other heart-valve replacement: Secondary | ICD-10-CM

## 2014-02-16 DIAGNOSIS — I6200 Nontraumatic subdural hemorrhage, unspecified: Secondary | ICD-10-CM

## 2014-02-16 DIAGNOSIS — I2510 Atherosclerotic heart disease of native coronary artery without angina pectoris: Secondary | ICD-10-CM

## 2014-02-16 DIAGNOSIS — R55 Syncope and collapse: Secondary | ICD-10-CM

## 2014-02-16 DIAGNOSIS — Z950 Presence of cardiac pacemaker: Secondary | ICD-10-CM

## 2014-02-16 DIAGNOSIS — M25511 Pain in right shoulder: Secondary | ICD-10-CM

## 2014-02-16 DIAGNOSIS — K2970 Gastritis, unspecified, without bleeding: Secondary | ICD-10-CM

## 2014-02-16 DIAGNOSIS — K2990 Gastroduodenitis, unspecified, without bleeding: Principal | ICD-10-CM

## 2014-02-16 DIAGNOSIS — M25519 Pain in unspecified shoulder: Secondary | ICD-10-CM

## 2014-02-16 DIAGNOSIS — Z952 Presence of prosthetic heart valve: Secondary | ICD-10-CM

## 2014-02-16 DIAGNOSIS — I495 Sick sinus syndrome: Secondary | ICD-10-CM

## 2014-02-16 DIAGNOSIS — E785 Hyperlipidemia, unspecified: Secondary | ICD-10-CM

## 2014-02-16 DIAGNOSIS — I4729 Other ventricular tachycardia: Secondary | ICD-10-CM

## 2014-02-16 DIAGNOSIS — I4891 Unspecified atrial fibrillation: Secondary | ICD-10-CM

## 2014-02-16 DIAGNOSIS — Z7901 Long term (current) use of anticoagulants: Secondary | ICD-10-CM

## 2014-02-16 NOTE — Progress Notes (Signed)
Patient ID: [name], male   DOB: 10/05/1928, 80 y.o.   MRN: [id number] SUBJECTIVE: CC: Chief Complaint  Patient presents with  . Hospitalization Follow-up    went to ER "vertigo" states had protime last week and was 2.3    HPI:  1)here for follow up on his vertigo.almost gone with the meclizine. No headache , no chest pain, no palpitations. hes been bending over a lot prepping the soil for his garden   2) right shoulder joint has been sore with work. Had to have surgery on th eleft shoulder in the past. No radiculopathy. No weakness. When he reaches for things in front , across and overhead , the middle of the shoulder is a sharp pain.   Past Medical History  Diagnosis Date  . Aortic stenosis     St.Jude AVR in 1985; Echo 8/12: EF 50-55%, mild LVH, mech AV with mild AI, mean gradient 14 mmHg, mild to mod MR;  Echo 8/13: EF 60-65%, AVR ok with mean gradient 19 mmHg  . CAD (coronary artery disease)     Mild, nonobstructive at catheterization 5/01  . Personal history of subdural hematoma     Requiring craniotomy  . History of pulmonary embolism     post op DVT/PE;  s/p IVC filter  . Hyperlipidemia   . Hypothyroidism   . S/P cholecystectomy 2005  . CVA (cerebral infarction) 12/2010  . S/P shoulder surgery     left  . Carotid stenosis     Dopplers 8/13: RICA 40-50% and LICA 0-39% => followup 1 year  . DVT (deep venous thrombosis)   . Syncope     + carotid sinus massage  . Pacemaker  Biotronik    Past Surgical History  Procedure Laterality Date  . Aortic valve replacement  1985    St. Jude  . Cardiac catheterization    . Vascular surgery    . Brain surgery     History   Social History  . Marital Status: Married    Spouse Name: N/A    Number of Children: N/A  . Years of Education: N/A   Occupational History  . Retired    Social History Main Topics  . Smoking status: Never Smoker   . Smokeless tobacco: Not on file  . Alcohol Use: No  . Drug Use: No  .  Sexual Activity: Not on file   Other Topics Concern  . Not on file   Social History Narrative  . No narrative on file   Family History  Problem Relation Age of Onset  . Coronary artery disease Other    Current Outpatient Prescriptions on File Prior to Visit  Medication Sig Dispense Refill  . aspirin EC 81 MG tablet Take 1 tablet (81 mg total) by mouth daily.      Marland Kitchen docusate sodium (COLACE) 100 MG capsule Take 100 mg by mouth daily.       Marland Kitchen ezetimibe-simvastatin (VYTORIN) 10-40 MG per tablet Take 1 tablet by mouth daily.        Marland Kitchen latanoprost (XALATAN) 0.005 % ophthalmic solution Place 1 drop into the right eye at bedtime.  7.5 mL  0  . levothyroxine (SYNTHROID, LEVOTHROID) 75 MCG tablet Take 1 tablet (75 mcg total) by mouth daily.  90 tablet  0  . meclizine (ANTIVERT) 50 MG tablet Take 1 tablet (50 mg total) by mouth 3 (three) times daily as needed.  30 tablet  0  . metoprolol (LOPRESSOR) 25 MG tablet Take  1 tablet (25 mg total) by mouth 2 (two) times daily.  60 tablet  1  . Multiple Vitamin (MULTIVITAMIN) tablet Take 1 tablet by mouth daily.        . pantoprazole (PROTONIX) 40 MG tablet Take 1 tablet (40 mg total) by mouth daily.  90 tablet  0  . tamsulosin (FLOMAX) 0.4 MG CAPS capsule Take 1 capsule (0.4 mg total) by mouth 2 (two) times daily.  180 capsule  0  . warfarin (COUMADIN) 3 MG tablet Take 3 mg by mouth daily. Take 3 mg daily except take 3 mg on mondays and fridays       No current facility-administered medications on file prior to visit.   Allergies  Allergen Reactions  . Codeine Nausea Only  . Ivp Dye [Iodinated Diagnostic Agents] Swelling  . Sulfonamide Derivatives Hives and Itching   Immunization History  Administered Date(s) Administered  . Influenza,inj,Quad PF,36+ Mos 08/25/2013   Prior to Admission medications   Medication Sig Start Date End Date Taking? Authorizing Provider  aspirin EC 81 MG tablet Take 1 tablet (81 mg total) by mouth daily. 06/08/11   Laurey Moralealton  S McLean, MD  docusate sodium (COLACE) 100 MG capsule Take 100 mg by mouth daily.     Historical Provider, MD  ezetimibe-simvastatin (VYTORIN) 10-40 MG per tablet Take 1 tablet by mouth daily.      Historical Provider, MD  latanoprost (XALATAN) 0.005 % ophthalmic solution Place 1 drop into the right eye at bedtime. 12/31/13   Ileana LaddFrancis P Markeise Mathews, MD  levothyroxine (SYNTHROID, LEVOTHROID) 75 MCG tablet Take 1 tablet (75 mcg total) by mouth daily. 12/31/13   Ileana LaddFrancis P Shakayla Hickox, MD  meclizine (ANTIVERT) 50 MG tablet Take 1 tablet (50 mg total) by mouth 3 (three) times daily as needed. 02/09/14   Mora BellmanHannah S Merrell, PA-C  metoprolol (LOPRESSOR) 25 MG tablet Take 1 tablet (25 mg total) by mouth 2 (two) times daily. 02/09/14   Mora BellmanHannah S Merrell, PA-C  Multiple Vitamin (MULTIVITAMIN) tablet Take 1 tablet by mouth daily.      Historical Provider, MD  pantoprazole (PROTONIX) 40 MG tablet Take 1 tablet (40 mg total) by mouth daily. 12/31/13   Ileana LaddFrancis P Callum Wolf, MD  tamsulosin (FLOMAX) 0.4 MG CAPS capsule Take 1 capsule (0.4 mg total) by mouth 2 (two) times daily. 12/31/13   Ileana LaddFrancis P Aryssa Rosamond, MD  warfarin (COUMADIN) 3 MG tablet Take 3 mg by mouth daily. Take 3 mg daily except take 3 mg on mondays and fridays    Historical Provider, MD     ROS: As above in the HPI. All other systems are stable or negative.  OBJECTIVE: APPEARANCE:  Patient in no acute distress.The patient appeared well nourished and normally developed. Acyanotic. Waist: VITAL SIGNS:BP 120/69  Pulse 72  Temp(Src) 98 F (36.7 C) (Oral)  Ht 5\' 10"  (1.778 m)  Wt 148 lb 6.4 oz (67.314 kg)  BMI 21.29 kg/m2  Elderly WM  SKIN: warm and  Dry without overt rashes, tattoos and scars  HEAD and Neck: without JVD, Head and scalp: normal Eyes:No scleral icterus. Fundi normal, eye movements normal. Ears: Auricle normal, canal normal, Tympanic membranes normal, insufflation normal. Nose: normal Throat: normal Neck & thyroid: normal  CHEST & LUNGS: Chest wall:  normal Lungs: Clear  CVS: Reveals the PMI to be normally located. Regular rhythm, First and Second Heart sounds are normal,  absence of murmurs, rubs or gallops. Peripheral vasculature: Radial pulses: normal Dorsal pedis pulses: normal Posterior pulses: normal  ABDOMEN:  Appearance: normal Benign, no organomegaly, no masses, no Abdominal Aortic enlargement. No Guarding , no rebound. No Bruits. Bowel sounds: normal  RECTAL: N/A GU: N/A  EXTREMETIES: nonedematous.  MUSCULOSKELETAL:  Spine:reduced ROMJoints: right shoulder cross reaching is limited a little compared to the left. Mild painfull arc. Empty can test is normal. Mild pain on external rotation.   NEUROLOGIC: oriented to time,place and person; nonfocal. Strength is normal Sensory is normal Reflexes are normal Cranial Nerves are normal.  Results for orders placed during the hospital encounter of 02/09/14  URINE CULTURE      Result Value Ref Range   Specimen Description URINE, CLEAN CATCH     Special Requests NONE     Culture  Setup Time       Value: 02/09/2014 17:42     Performed at [facility] Count       Value: NO GROWTH     Performed at [facility]   Culture       Value: NO GROWTH     Performed at [facility]   Report Status 02/10/2014 FINAL    CBC WITH DIFFERENTIAL      Result Value Ref Range   WBC 3.8 (*) 4.0 - 10.5 K/uL   RBC 3.81 (*) 4.22 - 5.81 MIL/uL   Hemoglobin 12.6 (*) 13.0 - 17.0 g/dL   HCT 91.4 (*) 78.2 - 95.6 %   MCV 96.9  78.0 - 100.0 fL   MCH 33.1  26.0 - 34.0 pg   MCHC 34.1  30.0 - 36.0 g/dL   RDW 21.3  08.6 - 57.8 %   Platelets 158  150 - 400 K/uL   Neutrophils Relative % 79 (*) 43 - 77 %   Neutro Abs 2.9  1.7 - 7.7 K/uL   Lymphocytes Relative 11 (*) 12 - 46 %   Lymphs Abs 0.4 (*) 0.7 - 4.0 K/uL   Monocytes Relative 9  3 - 12 %   Monocytes Absolute 0.4  0.1 - 1.0 K/uL   Eosinophils Relative 1  0 - 5 %   Eosinophils Absolute 0.1  0.0 - 0.7  K/uL   Basophils Relative 0  0 - 1 %   Basophils Absolute 0.0  0.0 - 0.1 K/uL  COMPREHENSIVE METABOLIC PANEL      Result Value Ref Range   Sodium 140  137 - 147 mEq/L   Potassium 4.4  3.7 - 5.3 mEq/L   Chloride 103  96 - 112 mEq/L   CO2 25  19 - 32 mEq/L   Glucose, Bld 100 (*) 70 - 99 mg/dL   BUN 13  6 - 23 mg/dL   Creatinine, Ser 4.69  0.50 - 1.35 mg/dL   Calcium 8.9  8.4 - 62.9 mg/dL   Total Protein 7.4  6.0 - 8.3 g/dL   Albumin 3.5  3.5 - 5.2 g/dL   AST 21  0 - 37 U/L   ALT 10  0 - 53 U/L   Alkaline Phosphatase 90  39 - 117 U/L   Total Bilirubin 0.8  0.3 - 1.2 mg/dL   GFR calc non Af Amer 61 (*) >90 mL/min   GFR calc Af Amer 71 (*) >90 mL/min  PROTIME-INR      Result Value Ref Range   Prothrombin Time 24.6 (*) 11.6 - 15.2 seconds   INR 2.31 (*) 0.00 - 1.49  URINALYSIS, ROUTINE W REFLEX MICROSCOPIC      Result Value  Ref Range   Color, Urine YELLOW  YELLOW   APPearance CLEAR  CLEAR   Specific Gravity, Urine 1.015  1.005 - 1.030   pH 7.5  5.0 - 8.0   Glucose, UA NEGATIVE  NEGATIVE mg/dL   Hgb urine dipstick NEGATIVE  NEGATIVE   Bilirubin Urine NEGATIVE  NEGATIVE   Ketones, ur NEGATIVE  NEGATIVE mg/dL   Protein, ur NEGATIVE  NEGATIVE mg/dL   Urobilinogen, UA 1.0  0.0 - 1.0 mg/dL   Nitrite NEGATIVE  NEGATIVE   Leukocytes, UA NEGATIVE  NEGATIVE  I-STAT TROPOININ, ED      Result Value Ref Range   Troponin i, poc 0.00  0.00 - 0.08 ng/mL   Comment 3           I-STAT TROPOININ, ED      Result Value Ref Range   Troponin i, poc 0.01  0.00 - 0.08 ng/mL   Comment 3              ASSESSMENT:   Unspecified gastritis and gastroduodenitis without mention of hemorrhage  Sinoatrial node dysfunction  S/P AVR (aortic valve replacement)  Pacemaker-Biotronik  Nonsustained ventricular tachycardia  Long term current use of anticoagulant  HYPERLIPIDEMIA  CAD, NATIVE VESSEL  Atrial fibrillation  Syncope  SUBDURAL HEMATOMA  Right shoulder pain - Plan: DG Shoulder  Right  PLAN: reviewed the ED notes. And work up Modified sermont exercises for vestibular dysfunction demonstarted and patient to do exercises at home twice a day.  Orders Placed This Encounter  Procedures  . DG Shoulder Right    Standing Status: Future     Number of Occurrences: 1     Standing Expiration Date: 04/19/2015    Order Specific Question:  Reason for Exam (SYMPTOM  OR DIAGNOSIS REQUIRED)    Answer:  right shoulder pain    Order Specific Question:  Preferred imaging location?    Answer:  Internal  [facility] reading (PRIMARY) by  Dr. Modesto [name]: mild  Degenerative changes. No acute findings.   Patient to do his rotator cuff exercises on his right shoulder as he has had to do in the past with the elastic bands. demonstrated the exercises in 4 planes to perform twice  A day.                               No orders of the defined types were placed in this encounter.   There are no discontinued medications. No Follow-up on file.  [name], M.D.

## 2014-02-20 ENCOUNTER — Encounter: Payer: Self-pay | Admitting: *Deleted

## 2014-02-26 ENCOUNTER — Telehealth: Payer: Self-pay | Admitting: Internal Medicine

## 2014-02-26 NOTE — Telephone Encounter (Signed)
New message     Pt got letter stating he has not had his device checked. He had it checked in the ER on 02-09-14 at [facility].

## 2014-02-26 NOTE — Telephone Encounter (Signed)
Appt made for 4-23 @ 2:30. No record of ppm check from hosp. Patient voiced understanding.

## 2014-03-05 ENCOUNTER — Ambulatory Visit (INDEPENDENT_AMBULATORY_CARE_PROVIDER_SITE_OTHER): Payer: Medicare HMO | Admitting: *Deleted

## 2014-03-05 ENCOUNTER — Encounter: Payer: Self-pay | Admitting: Internal Medicine

## 2014-03-05 DIAGNOSIS — I495 Sick sinus syndrome: Secondary | ICD-10-CM

## 2014-03-05 DIAGNOSIS — Z950 Presence of cardiac pacemaker: Secondary | ICD-10-CM

## 2014-03-05 LAB — MDC_IDC_ENUM_SESS_TYPE_INCLINIC
Date Time Interrogation Session: 20150423170645
Implantable Pulse Generator Serial Number: 66244569
Lead Channel Impedance Value: 448 Ohm
Lead Channel Pacing Threshold Amplitude: 0.6 V
Lead Channel Pacing Threshold Amplitude: 0.6 V
Lead Channel Pacing Threshold Amplitude: 0.7 V
Lead Channel Pacing Threshold Pulse Width: 0.4 ms
Lead Channel Pacing Threshold Pulse Width: 0.4 ms
Lead Channel Pacing Threshold Pulse Width: 0.4 ms
Lead Channel Sensing Intrinsic Amplitude: 13.4 mV
Lead Channel Sensing Intrinsic Amplitude: 4.2 mV
Lead Channel Setting Pacing Amplitude: 2 V
MDC IDC MSMT LEADCHNL RA PACING THRESHOLD PULSEWIDTH: 0.4 ms
MDC IDC MSMT LEADCHNL RV IMPEDANCE VALUE: 507 Ohm
MDC IDC MSMT LEADCHNL RV PACING THRESHOLD AMPLITUDE: 0.7 V
MDC IDC MSMT LEADCHNL RV SENSING INTR AMPL: 4.2 mV
MDC IDC SET LEADCHNL RA PACING AMPLITUDE: 2 V
MDC IDC SET LEADCHNL RV PACING PULSEWIDTH: 0.4 ms
MDC IDC STAT BRADY RA PERCENT PACED: 51 %
MDC IDC STAT BRADY RV PERCENT PACED: 1 %

## 2014-03-05 NOTE — Progress Notes (Signed)
Pacemaker check in clinic. Normal device function. Thresholds, sensing, impedances consistent with previous measurements. Device programmed to maximize longevity. 6 mode switches--- <1%. No high ventricular rates noted. Device programmed at appropriate safety margins. Histogram distribution appropriate for patient activity level. Device programmed to optimize intrinsic conduction. Estimated longevity 49yrs 67mo. ROV w/ SK in 41mo.

## 2014-03-16 ENCOUNTER — Telehealth: Payer: Self-pay | Admitting: Family Medicine

## 2014-03-16 NOTE — Telephone Encounter (Signed)
appt given for tomorrow with Ander Slade

## 2014-03-17 ENCOUNTER — Ambulatory Visit (INDEPENDENT_AMBULATORY_CARE_PROVIDER_SITE_OTHER): Payer: Medicare HMO | Admitting: Family Medicine

## 2014-03-17 ENCOUNTER — Ambulatory Visit (INDEPENDENT_AMBULATORY_CARE_PROVIDER_SITE_OTHER): Payer: Medicare HMO

## 2014-03-17 VITALS — BP 144/74 | HR 75 | Temp 96.1°F | Wt 149.2 lb

## 2014-03-17 DIAGNOSIS — R109 Unspecified abdominal pain: Secondary | ICD-10-CM

## 2014-03-17 LAB — POCT CBC
Granulocyte percent: 77.4 %G (ref 37–80)
HCT, POC: 37.6 % — AB (ref 43.5–53.7)
Hemoglobin: 12.5 g/dL — AB (ref 14.1–18.1)
Lymph, poc: 0.7 (ref 0.6–3.4)
MCH, POC: 32.6 pg — AB (ref 27–31.2)
MCHC: 33.4 g/dL (ref 31.8–35.4)
MCV: 97.9 fL — AB (ref 80–97)
MPV: 7.4 fL (ref 0–99.8)
POC Granulocyte: 2.9 (ref 2–6.9)
POC LYMPH PERCENT: 19.1 %L (ref 10–50)
Platelet Count, POC: 121 10*3/uL — AB (ref 142–424)
RBC: 3.8 M/uL — AB (ref 4.69–6.13)
RDW, POC: 13 %
WBC: 3.7 10*3/uL — AB (ref 4.6–10.2)

## 2014-03-19 NOTE — Progress Notes (Signed)
   Subjective:    Patient ID: [name], male    DOB: 1928/10/22, 80 y.o.   MRN: [id number]  HPI  This 80 y.o. male presents for evaluation of Right abdominal pain and discomfort.  He is worried his IVC filter may be causing this.  Review of Systems C/o abdominal pain   No chest pain, SOB, HA, dizziness, vision change, N/V, diarrhea, constipation, dysuria, urinary urgency or frequency, myalgias, arthralgias or rash.  Objective:   Physical Exam  Vital signs noted  Well developed well nourished male.  HEENT - Head atraumatic Normocephalic                Eyes - PERRLA, Conjuctiva - clear Sclera- Clear EOMI                Ears - EAC's Wnl TM's Wnl Gross Hearing WNL                Throat - oropharanx wnl Respiratory - Lungs CTA bilateral Cardiac - RRR S1 and S2 without murmur GI - Abdomen soft Nontender and bowel sounds active x 4 Extremities - No edema.  KUB - Stool in RLQ .Prelimnary reading by Angeline Slim  Results for orders placed in visit on 03/17/14  POCT CBC      Result Value Ref Range   WBC 3.7 (*) 4.6 - 10.2 K/uL   Lymph, poc 0.7  0.6 - 3.4   POC LYMPH PERCENT 19.1  10 - 50 %L   POC Granulocyte 2.9  2 - 6.9   Granulocyte percent 77.4  37 - 80 %G   RBC 3.8 (*) 4.69 - 6.13 M/uL   Hemoglobin 12.5 (*) 14.1 - 18.1 g/dL   HCT, POC 88.8 (*) 91.6 - 53.7 %   MCV 97.9 (*) 80 - 97 fL   MCH, POC 32.6 (*) 27 - 31.2 pg   MCHC 33.4  31.8 - 35.4 g/dL   RDW, POC 94.5     Platelet Count, POC 121.0 (*) 142 - 424 K/uL   MPV 7.4  0 - 99.8 fL      Assessment & Plan:  Abdominal pain, unspecified site - Plan: DG Abd 1 View, POCT CBC.  Discussed with him that  KUB and cbc is normal and abdominal pain is due to constipation or muscle strain but reassured him That the abdominal pain is not due to the filter.  Deatra Canter FNP

## 2014-03-24 ENCOUNTER — Ambulatory Visit (INDEPENDENT_AMBULATORY_CARE_PROVIDER_SITE_OTHER): Payer: Medicare HMO | Admitting: Pharmacist Clinician (PhC)/ Clinical Pharmacy Specialist

## 2014-03-24 ENCOUNTER — Other Ambulatory Visit: Payer: Self-pay | Admitting: Family Medicine

## 2014-03-24 DIAGNOSIS — I4891 Unspecified atrial fibrillation: Secondary | ICD-10-CM

## 2014-03-24 DIAGNOSIS — Z954 Presence of other heart-valve replacement: Secondary | ICD-10-CM

## 2014-03-24 DIAGNOSIS — Z952 Presence of prosthetic heart valve: Secondary | ICD-10-CM

## 2014-03-24 LAB — POCT INR: INR: 2.6

## 2014-03-24 MED ORDER — METHYLPREDNISOLONE (PAK) 4 MG PO TABS: ORAL_TABLET | ORAL | Status: DC

## 2014-03-27 ENCOUNTER — Telehealth: Payer: Self-pay | Admitting: Family Medicine

## 2014-03-27 DIAGNOSIS — R195 Other fecal abnormalities: Secondary | ICD-10-CM

## 2014-03-30 ENCOUNTER — Other Ambulatory Visit: Payer: Self-pay | Admitting: Family Medicine

## 2014-03-30 DIAGNOSIS — R195 Other fecal abnormalities: Secondary | ICD-10-CM

## 2014-03-30 MED ORDER — LEVOTHYROXINE SODIUM 75 MCG PO TABS: 75.0000 ug | ORAL_TABLET | Freq: Every day | ORAL | Status: DC

## 2014-03-30 MED ORDER — PANTOPRAZOLE SODIUM 40 MG PO TBEC: 40.0000 mg | DELAYED_RELEASE_TABLET | Freq: Every day | ORAL | Status: DC

## 2014-03-30 NOTE — Telephone Encounter (Signed)
meds sent to pharm

## 2014-03-30 NOTE — Telephone Encounter (Signed)
I DO NOT SEE A TSH SINCE 2013

## 2014-04-08 ENCOUNTER — Other Ambulatory Visit: Payer: Self-pay | Admitting: *Deleted

## 2014-04-08 MED ORDER — WARFARIN SODIUM 3 MG PO TABS: 3.0000 mg | ORAL_TABLET | Freq: Every day | ORAL | Status: DC

## 2014-04-10 ENCOUNTER — Telehealth: Payer: Self-pay | Admitting: Family Medicine

## 2014-04-10 ENCOUNTER — Other Ambulatory Visit: Payer: Self-pay

## 2014-04-10 MED ORDER — WARFARIN SODIUM 3 MG PO TABS: 3.0000 mg | ORAL_TABLET | Freq: Every day | ORAL | Status: DC

## 2014-04-13 ENCOUNTER — Other Ambulatory Visit: Payer: Self-pay | Admitting: Family

## 2014-04-13 MED ORDER — WARFARIN SODIUM 3 MG PO TABS: ORAL_TABLET | ORAL | Status: DC

## 2014-04-20 ENCOUNTER — Ambulatory Visit: Payer: Medicare HMO | Admitting: Family Medicine

## 2014-05-05 ENCOUNTER — Ambulatory Visit (INDEPENDENT_AMBULATORY_CARE_PROVIDER_SITE_OTHER): Payer: Medicare HMO | Admitting: Pharmacist Clinician (PhC)/ Clinical Pharmacy Specialist

## 2014-05-05 DIAGNOSIS — Z952 Presence of prosthetic heart valve: Secondary | ICD-10-CM

## 2014-05-05 DIAGNOSIS — I4891 Unspecified atrial fibrillation: Secondary | ICD-10-CM

## 2014-05-05 DIAGNOSIS — Z954 Presence of other heart-valve replacement: Secondary | ICD-10-CM

## 2014-05-05 LAB — POCT INR: INR: 2.8

## 2014-05-05 MED ORDER — WARFARIN SODIUM 3 MG PO TABS: ORAL_TABLET | ORAL | Status: DC

## 2014-05-05 MED ORDER — SIMVASTATIN 40 MG PO TABS: 40.0000 mg | ORAL_TABLET | Freq: Every day | ORAL | Status: DC

## 2014-05-05 MED ORDER — EZETIMIBE 10 MG PO TABS
10.0000 mg | ORAL_TABLET | Freq: Every day | ORAL | Status: DC
Start: 2014-05-05 — End: 2014-08-31

## 2014-05-06 ENCOUNTER — Encounter: Payer: Self-pay | Admitting: Family Medicine

## 2014-05-06 ENCOUNTER — Ambulatory Visit (INDEPENDENT_AMBULATORY_CARE_PROVIDER_SITE_OTHER): Payer: Medicare HMO | Admitting: Family Medicine

## 2014-05-06 VITALS — BP 114/63 | HR 66 | Temp 97.4°F | Ht 70.0 in | Wt 149.2 lb

## 2014-05-06 DIAGNOSIS — N400 Enlarged prostate without lower urinary tract symptoms: Secondary | ICD-10-CM

## 2014-05-06 DIAGNOSIS — I10 Essential (primary) hypertension: Secondary | ICD-10-CM

## 2014-05-06 DIAGNOSIS — E039 Hypothyroidism, unspecified: Secondary | ICD-10-CM

## 2014-05-06 LAB — POCT CBC
Granulocyte percent: 64.8 %G (ref 37–80)
HCT, POC: 39.9 % — AB (ref 43.5–53.7)
Hemoglobin: 12.8 g/dL — AB (ref 14.1–18.1)
Lymph, poc: 0.9 (ref 0.6–3.4)
MCH, POC: 30.7 pg (ref 27–31.2)
MCHC: 32.1 g/dL (ref 31.8–35.4)
MCV: 95.6 fL (ref 80–97)
MPV: 7.4 fL (ref 0–99.8)
POC Granulocyte: 2.1 (ref 2–6.9)
POC LYMPH PERCENT: 25.8 %L (ref 10–50)
Platelet Count, POC: 138 10*3/uL — AB (ref 142–424)
RBC: 4.2 M/uL — AB (ref 4.69–6.13)
RDW, POC: 13.2 %
WBC: 3.3 10*3/uL — AB (ref 4.6–10.2)

## 2014-05-06 MED ORDER — TAMSULOSIN HCL 0.4 MG PO CAPS: 0.4000 mg | ORAL_CAPSULE | Freq: Two times a day (BID) | ORAL | Status: DC

## 2014-05-06 NOTE — Progress Notes (Signed)
   Subjective:    Patient ID: [name], male    DOB: Feb 04, 1928, 80 y.o.   MRN: [id number]  HPI This 80 y.o. male presents for evaluation of routine follow up.  He has hx of hypertension, hyperlipidemia, A-fib, and hypothyroidism.  He has no acute complaints.   Review of Systems No chest pain, SOB, HA, dizziness, vision change, N/V, diarrhea, constipation, dysuria, urinary urgency or frequency, myalgias, arthralgias or rash.     Objective:   Physical Exam  Vital signs noted  Well developed well nourished elderly male.  HEENT - Head atraumatic Normocephalic                Eyes - PERRLA, Conjuctiva - clear Sclera- Clear EOMI                Ears - EAC's Wnl TM's Wnl Gross Hearing WNL                Nose - Nares patent                 Throat - oropharanx wnl Respiratory - Lungs CTA bilateral Cardiac - RRR S1 and S2 without murmur GI - Abdomen soft Nontender and bowel sounds active x 4 Extremities - No edema. Neuro - Grossly intact.      Assessment & Plan:  Unspecified hypothyroidism - Plan: POCT CBC, CMP14+EGFR, Lipid panel, TSH  Essential hypertension, benign - Plan: POCT CBC, CMP14+EGFR, Lipid panel, TSH  BPH (benign prostatic hyperplasia) - Plan: tamsulosin (FLOMAX) 0.4 MG CAPS capsule  Follow up in 6 months  Lysbeth Penner FNP

## 2014-05-07 LAB — LIPID PANEL
Chol/HDL Ratio: 2.1 ratio units (ref 0.0–5.0)
Cholesterol, Total: 134 mg/dL (ref 100–199)
HDL: 63 mg/dL (ref >39)
LDL Calculated: 60 mg/dL (ref 0–99)
Triglycerides: 56 mg/dL (ref 0–149)
VLDL Cholesterol Cal: 11 mg/dL (ref 5–40)

## 2014-05-07 LAB — CMP14+EGFR
ALT: 13 IU/L (ref 0–44)
AST: 24 IU/L (ref 0–40)
Albumin/Globulin Ratio: 1.8 (ref 1.1–2.5)
Albumin: 4.5 g/dL (ref 3.5–4.7)
Alkaline Phosphatase: 79 IU/L (ref 39–117)
BUN/Creatinine Ratio: 11 (ref 10–22)
BUN: 14 mg/dL (ref 8–27)
CO2: 24 mmol/L (ref 18–29)
Calcium: 9.4 mg/dL (ref 8.6–10.2)
Chloride: 102 mmol/L (ref 97–108)
Creatinine, Ser: 1.25 mg/dL (ref 0.76–1.27)
GFR calc Af Amer: 61 mL/min/{1.73_m2} (ref >59)
GFR calc non Af Amer: 53 mL/min/{1.73_m2} — ABNORMAL LOW (ref >59)
Globulin, Total: 2.5 g/dL (ref 1.5–4.5)
Glucose: 95 mg/dL (ref 65–99)
Potassium: 4.9 mmol/L (ref 3.5–5.2)
Sodium: 141 mmol/L (ref 134–144)
Total Bilirubin: 0.7 mg/dL (ref 0.0–1.2)
Total Protein: 7 g/dL (ref 6.0–8.5)

## 2014-05-07 LAB — TSH: TSH: 1.4 u[IU]/mL (ref 0.450–4.500)

## 2014-05-30 ENCOUNTER — Other Ambulatory Visit: Payer: Self-pay | Admitting: Family Medicine

## 2014-06-15 ENCOUNTER — Telehealth: Payer: Self-pay | Admitting: Family Medicine

## 2014-06-15 DIAGNOSIS — R195 Other fecal abnormalities: Secondary | ICD-10-CM

## 2014-06-16 MED ORDER — LEVOTHYROXINE SODIUM 75 MCG PO TABS: 75.0000 ug | ORAL_TABLET | Freq: Every day | ORAL | Status: DC

## 2014-06-16 MED ORDER — PANTOPRAZOLE SODIUM 40 MG PO TBEC: 40.0000 mg | DELAYED_RELEASE_TABLET | Freq: Every day | ORAL | Status: DC

## 2014-06-16 NOTE — Telephone Encounter (Signed)
done

## 2014-06-23 ENCOUNTER — Ambulatory Visit (INDEPENDENT_AMBULATORY_CARE_PROVIDER_SITE_OTHER): Payer: Medicare HMO | Admitting: Pharmacist Clinician (PhC)/ Clinical Pharmacy Specialist

## 2014-06-23 DIAGNOSIS — Z952 Presence of prosthetic heart valve: Secondary | ICD-10-CM

## 2014-06-23 DIAGNOSIS — Z954 Presence of other heart-valve replacement: Secondary | ICD-10-CM

## 2014-06-23 DIAGNOSIS — I4891 Unspecified atrial fibrillation: Secondary | ICD-10-CM

## 2014-06-23 LAB — POCT INR: INR: 2.1

## 2014-07-06 ENCOUNTER — Other Ambulatory Visit: Payer: Self-pay | Admitting: Family Medicine

## 2014-07-06 DIAGNOSIS — N400 Enlarged prostate without lower urinary tract symptoms: Secondary | ICD-10-CM

## 2014-07-06 MED ORDER — TAMSULOSIN HCL 0.4 MG PO CAPS: 0.4000 mg | ORAL_CAPSULE | Freq: Two times a day (BID) | ORAL | Status: DC

## 2014-07-27 ENCOUNTER — Ambulatory Visit (INDEPENDENT_AMBULATORY_CARE_PROVIDER_SITE_OTHER): Payer: Medicare HMO | Admitting: Pharmacist

## 2014-07-27 DIAGNOSIS — I82409 Acute embolism and thrombosis of unspecified deep veins of unspecified lower extremity: Secondary | ICD-10-CM

## 2014-07-27 DIAGNOSIS — Z952 Presence of prosthetic heart valve: Secondary | ICD-10-CM

## 2014-07-27 DIAGNOSIS — I4891 Unspecified atrial fibrillation: Secondary | ICD-10-CM

## 2014-07-27 DIAGNOSIS — I495 Sick sinus syndrome: Secondary | ICD-10-CM

## 2014-07-27 DIAGNOSIS — G9001 Carotid sinus syncope: Secondary | ICD-10-CM

## 2014-07-27 DIAGNOSIS — Z954 Presence of other heart-valve replacement: Secondary | ICD-10-CM

## 2014-07-27 LAB — POCT INR: INR: 2.6

## 2014-07-27 NOTE — Patient Instructions (Signed)
Anticoagulation Dose Instructions as of 07/27/2014     [name] Tue Wed Thu Fri Sat   New Dose 3 mg 1.5 mg 3 mg 3 mg 3 mg 1.5 mg 3 mg    Description       * Goal 2.0-2.5 per cardiologist* Continue 1/2 tablet on Mondays and Fridays and 1 tablet all other days.      INR was 2.6 today

## 2014-07-31 ENCOUNTER — Ambulatory Visit: Payer: Self-pay

## 2014-08-07 ENCOUNTER — Ambulatory Visit: Payer: Self-pay

## 2014-08-10 ENCOUNTER — Other Ambulatory Visit (INDEPENDENT_AMBULATORY_CARE_PROVIDER_SITE_OTHER): Payer: Medicare HMO | Admitting: *Deleted

## 2014-08-10 ENCOUNTER — Other Ambulatory Visit (HOSPITAL_COMMUNITY): Payer: Self-pay | Admitting: Cardiology

## 2014-08-10 ENCOUNTER — Ambulatory Visit (INDEPENDENT_AMBULATORY_CARE_PROVIDER_SITE_OTHER): Payer: Medicare HMO

## 2014-08-10 DIAGNOSIS — Z23 Encounter for immunization: Secondary | ICD-10-CM

## 2014-08-10 DIAGNOSIS — I6529 Occlusion and stenosis of unspecified carotid artery: Secondary | ICD-10-CM

## 2014-08-25 ENCOUNTER — Other Ambulatory Visit: Payer: Self-pay | Admitting: *Deleted

## 2014-08-25 MED ORDER — SIMVASTATIN 40 MG PO TABS: 40.0000 mg | ORAL_TABLET | Freq: Every day | ORAL | Status: DC

## 2014-08-26 ENCOUNTER — Ambulatory Visit (HOSPITAL_COMMUNITY): Payer: Medicare HMO | Attending: Cardiovascular Disease | Admitting: Cardiology

## 2014-08-26 DIAGNOSIS — Z8673 Personal history of transient ischemic attack (TIA), and cerebral infarction without residual deficits: Secondary | ICD-10-CM | POA: Insufficient documentation

## 2014-08-26 DIAGNOSIS — E785 Hyperlipidemia, unspecified: Secondary | ICD-10-CM | POA: Diagnosis not present

## 2014-08-26 DIAGNOSIS — I2510 Atherosclerotic heart disease of native coronary artery without angina pectoris: Secondary | ICD-10-CM | POA: Insufficient documentation

## 2014-08-26 DIAGNOSIS — Z951 Presence of aortocoronary bypass graft: Secondary | ICD-10-CM | POA: Diagnosis not present

## 2014-08-26 DIAGNOSIS — I6523 Occlusion and stenosis of bilateral carotid arteries: Secondary | ICD-10-CM

## 2014-08-26 DIAGNOSIS — I6529 Occlusion and stenosis of unspecified carotid artery: Secondary | ICD-10-CM

## 2014-08-26 NOTE — Progress Notes (Signed)
Carotid duplex performed 

## 2014-08-31 ENCOUNTER — Other Ambulatory Visit: Payer: Self-pay | Admitting: Family Medicine

## 2014-08-31 MED ORDER — EZETIMIBE 10 MG PO TABS: 10.0000 mg | ORAL_TABLET | Freq: Every day | ORAL | Status: DC

## 2014-09-08 ENCOUNTER — Ambulatory Visit (INDEPENDENT_AMBULATORY_CARE_PROVIDER_SITE_OTHER): Payer: Medicare HMO | Admitting: Pharmacist Clinician (PhC)/ Clinical Pharmacy Specialist

## 2014-09-08 DIAGNOSIS — I482 Chronic atrial fibrillation, unspecified: Secondary | ICD-10-CM

## 2014-09-08 DIAGNOSIS — Z954 Presence of other heart-valve replacement: Secondary | ICD-10-CM

## 2014-09-08 DIAGNOSIS — I4891 Unspecified atrial fibrillation: Secondary | ICD-10-CM

## 2014-09-08 DIAGNOSIS — Z952 Presence of prosthetic heart valve: Secondary | ICD-10-CM

## 2014-09-08 LAB — POCT INR: INR: 2.7

## 2014-09-11 ENCOUNTER — Encounter: Payer: Self-pay | Admitting: Internal Medicine

## 2014-09-11 ENCOUNTER — Ambulatory Visit (INDEPENDENT_AMBULATORY_CARE_PROVIDER_SITE_OTHER): Payer: Commercial Managed Care - HMO | Admitting: Internal Medicine

## 2014-09-11 VITALS — BP 140/66 | HR 104 | Ht 71.0 in | Wt 151.0 lb

## 2014-09-11 DIAGNOSIS — Z45018 Encounter for adjustment and management of other part of cardiac pacemaker: Secondary | ICD-10-CM

## 2014-09-11 DIAGNOSIS — Z954 Presence of other heart-valve replacement: Secondary | ICD-10-CM

## 2014-09-11 DIAGNOSIS — G9001 Carotid sinus syncope: Secondary | ICD-10-CM

## 2014-09-11 DIAGNOSIS — I480 Paroxysmal atrial fibrillation: Secondary | ICD-10-CM

## 2014-09-11 DIAGNOSIS — Z952 Presence of prosthetic heart valve: Secondary | ICD-10-CM

## 2014-09-11 DIAGNOSIS — R42 Dizziness and giddiness: Secondary | ICD-10-CM

## 2014-09-11 LAB — MDC_IDC_ENUM_SESS_TYPE_INCLINIC
Battery Remaining Longevity: 64 mo
Date Time Interrogation Session: 20151030111807
Implantable Pulse Generator Serial Number: 66244569
Lead Channel Impedance Value: 448 Ohm
Lead Channel Impedance Value: 526 Ohm
Lead Channel Pacing Threshold Amplitude: 0.7 V
Lead Channel Pacing Threshold Amplitude: 0.7 V
Lead Channel Pacing Threshold Pulse Width: 0.4 ms
Lead Channel Pacing Threshold Pulse Width: 0.4 ms
Lead Channel Pacing Threshold Pulse Width: 0.4 ms
Lead Channel Pacing Threshold Pulse Width: 0.4 ms
Lead Channel Sensing Intrinsic Amplitude: 3.4 mV
Lead Channel Sensing Intrinsic Amplitude: 3.4 mV
Lead Channel Setting Pacing Amplitude: 2 V
Lead Channel Setting Pacing Pulse Width: 0.4 ms
MDC IDC MSMT LEADCHNL RA PACING THRESHOLD AMPLITUDE: 0.7 V
MDC IDC MSMT LEADCHNL RV PACING THRESHOLD AMPLITUDE: 0.7 V
MDC IDC MSMT LEADCHNL RV SENSING INTR AMPL: 14.1 mV
MDC IDC SET LEADCHNL RA PACING AMPLITUDE: 2 V
MDC IDC STAT BRADY RA PERCENT PACED: 55 %
MDC IDC STAT BRADY RV PERCENT PACED: 3 %

## 2014-09-11 NOTE — Progress Notes (Signed)
Patient Care Team: Ernestina Penna, MD as PCP - General (Family Medicine)   HPI  [name] is a 80 y.o. male Seen in followup for carotid sinus hypersensitivity and syncope. He is status post pacemaker-Biotronik implantation.  He also has a history of mechanical aortic valve replacement with a subdural hematoma requiring craniotomy 2004. He has been on Coumadin in following a stroke in 2012 aspirin was added to his regime, appropriate   in the context of his valve  Echo EF normal  He has some orthostatic dizziness exercise tolerance remains reasonable and adequate  Past Medical History  Diagnosis Date  . Aortic stenosis     St.Jude AVR in 1985; Echo 8/12: EF 50-55%, mild LVH, mech AV with mild AI, mean gradient 14 mmHg, mild to mod MR;  Echo 8/13: EF 60-65%, AVR ok with mean gradient 19 mmHg  . CAD (coronary artery disease)     Mild, nonobstructive at catheterization 5/01  . Personal history of subdural hematoma     Requiring craniotomy  . History of pulmonary embolism     post op DVT/PE;  s/p IVC filter  . Hyperlipidemia   . Hypothyroidism   . S/P cholecystectomy 2005  . CVA (cerebral infarction) 12/2010  . S/P shoulder surgery     left  . Carotid stenosis     Dopplers 8/13: RICA 40-50% and LICA 0-39% => followup 1 year  . DVT (deep venous thrombosis)   . Syncope     + carotid sinus massage  . Pacemaker  Biotronik     Past Surgical History  Procedure Laterality Date  . Aortic valve replacement  1985    St. Jude  . Cardiac catheterization    . Vascular surgery    . Brain surgery      Current Outpatient Prescriptions  Medication Sig Dispense Refill  . aspirin EC 81 MG tablet Take 1 tablet (81 mg total) by mouth daily.      Marland Kitchen docusate sodium (COLACE) 100 MG capsule Take 100 mg by mouth daily.       Marland Kitchen ezetimibe (ZETIA) 10 MG tablet Take 1 tablet (10 mg total) by mouth daily.  30 tablet  12  . latanoprost (XALATAN) 0.005 % ophthalmic solution Place 1 drop into  the right eye at bedtime.  7.5 mL  0  . levothyroxine (SYNTHROID, LEVOTHROID) 75 MCG tablet Take 1 tablet (75 mcg total) by mouth daily.  90 tablet  2  . meclizine (ANTIVERT) 50 MG tablet Take 1 tablet (50 mg total) by mouth 3 (three) times daily as needed.  30 tablet  0  . metoprolol (LOPRESSOR) 25 MG tablet Take 1 tablet (25 mg total) by mouth 2 (two) times daily.  60 tablet  1  . Multiple Vitamin (MULTIVITAMIN) tablet Take 1 tablet by mouth daily.        . pantoprazole (PROTONIX) 40 MG tablet Take 1 tablet (40 mg total) by mouth daily.  90 tablet  1  . simvastatin (ZOCOR) 40 MG tablet Take 1 tablet (40 mg total) by mouth at bedtime.  90 tablet  1  . tamsulosin (FLOMAX) 0.4 MG CAPS capsule Take 1 capsule (0.4 mg total) by mouth 2 (two) times daily.  180 capsule  3  . warfarin (COUMADIN) 3 MG tablet Take 3 mg daily except take 1.5 mg on mondays and fridays  30 tablet  12   No current facility-administered medications for this visit.    Allergies  Allergen Reactions  .  Ivp Dye [Iodinated Diagnostic Agents] Swelling    Swelling   . Codeine Nausea Only    Nausea   . Sulfonamide Derivatives Hives and Itching    Hives itching     Review of Systems negative except from HPI and PMH  Physical Exam BP 140/66  Pulse 104  Ht 5\' 11"  (1.803 m)  Wt 151 lb (68.493 kg)  BMI 21.07 kg/m2 Well developed and well nourished in no acute distress HENT normal E scleral and icterus clear Neck Supple JVP flat; carotids brisk and full Clear to ausculation  Regular rate and rhythm, mechanical S2 wearly systolic murmur Soft with active bowel sounds No clubbing cyanosis no Edema Alert and oriented, grossly normal motor and sensory function Skin Warm and Dry  ECG demonstrates sinus rhythm at 60 intervals 23/11/38  Assessment and  Plan  Carotid sinus hypersensitivity   sinus node dysfunction/chronotropic incompetence  Pacemaker-Biotronik  Hypertension  Atrial fibrillation  Aortic valve  replacement-mechanical  Orthostatic dizziness  Overall doing quite well. His blood pressure where it ends as I'm concerned about potential for orthostasis.  His pacemaker will need to be reprogrammed to attenuate rate response  Blood pressure is well-controlled

## 2014-09-11 NOTE — Patient Instructions (Signed)
Your physician recommends that you continue on your current medications as directed. Please refer to the Current Medication list given to you today.  Your physician wants you to follow-up in: 1 year with Dr. Klein.  You will receive a reminder letter in the mail two months in advance. If you don't receive a letter, please call our office to schedule the follow-up appointment.  

## 2014-09-21 ENCOUNTER — Ambulatory Visit (INDEPENDENT_AMBULATORY_CARE_PROVIDER_SITE_OTHER): Payer: Medicare HMO | Admitting: Family Medicine

## 2014-09-21 ENCOUNTER — Encounter: Payer: Self-pay | Admitting: Family Medicine

## 2014-09-21 VITALS — BP 121/67 | HR 80 | Temp 97.0°F | Ht 70.0 in | Wt 150.4 lb

## 2014-09-21 DIAGNOSIS — N451 Epididymitis: Secondary | ICD-10-CM

## 2014-09-21 MED ORDER — CIPROFLOXACIN HCL 250 MG PO TABS: 250.0000 mg | ORAL_TABLET | Freq: Two times a day (BID) | ORAL | Status: DC

## 2014-09-21 NOTE — Progress Notes (Signed)
   Subjective:    Patient ID: [name], male    DOB: January 29, 1928, 80 y.o.   MRN: [id number]  HPI C/o left testicular swelling and discomfort.  Review of Systems  Constitutional: Negative for fever.  HENT: Negative for ear pain.   Eyes: Negative for discharge.  Respiratory: Negative for cough.   Cardiovascular: Negative for chest pain.  Gastrointestinal: Negative for abdominal distention.  Endocrine: Negative for polyuria.  Genitourinary: Negative for difficulty urinating.  Musculoskeletal: Negative for gait problem and neck pain.  Skin: Negative for color change and rash.  Neurological: Negative for speech difficulty and headaches.  Psychiatric/Behavioral: Negative for agitation.       Objective:    BP 121/67 mmHg  Pulse 80  Temp(Src) 97 F (36.1 C) (Oral)  Ht 5\' 10"  (1.778 m)  Wt 150 lb 6.4 oz (68.221 kg)  BMI 21.58 kg/m2 Physical Exam  Constitutional: He is oriented to person, place, and time. He appears well-developed and well-nourished.  HENT:  Head: Normocephalic and atraumatic.  Mouth/Throat: Oropharynx is clear and moist.  Eyes: Pupils are equal, round, and reactive to light.  Neck: Normal range of motion. Neck supple.  Cardiovascular: Normal rate and regular rhythm.   No murmur heard. Pulmonary/Chest: Effort normal and breath sounds normal.  Abdominal: Soft. Bowel sounds are normal. There is no tenderness.  Genitourinary:  Right testicular swelling and tenderness  Neurological: He is alert and oriented to person, place, and time.  Skin: Skin is warm and dry.  Psychiatric: He has a normal mood and affect.          Assessment & Plan:     ICD-9-CM ICD-10-CM   1. Epididymitis 604.90 N45.1 ciprofloxacin (CIPRO) 250 MG tablet     POCT UA - Microscopic Only     POCT urinalysis dipstick     Urine culture     No Follow-up on file.  Deatra Canter FNP

## 2014-09-22 ENCOUNTER — Telehealth: Payer: Self-pay | Admitting: Family Medicine

## 2014-09-22 ENCOUNTER — Other Ambulatory Visit: Payer: Self-pay | Admitting: Family Medicine

## 2014-09-22 LAB — POCT URINALYSIS DIPSTICK
Bilirubin, UA: NEGATIVE
Blood, UA: NEGATIVE
Glucose, UA: NEGATIVE
Ketones, UA: NEGATIVE
Leukocytes, UA: NEGATIVE
Nitrite, UA: NEGATIVE
Protein, UA: NEGATIVE
Spec Grav, UA: 1.01
Urobilinogen, UA: NEGATIVE
pH, UA: 8

## 2014-09-22 LAB — POCT UA - MICROSCOPIC ONLY
Bacteria, U Microscopic: NEGATIVE
Casts, Ur, LPF, POC: NEGATIVE
Crystals, Ur, HPF, POC: NEGATIVE
Mucus, UA: NEGATIVE
RBC, urine, microscopic: NEGATIVE
WBC, Ur, HPF, POC: NEGATIVE
Yeast, UA: NEGATIVE

## 2014-09-22 NOTE — Telephone Encounter (Signed)
Pharmacy notified.

## 2014-09-22 NOTE — Telephone Encounter (Signed)
Take rx how rx'd

## 2014-09-22 NOTE — Telephone Encounter (Signed)
Is this suppose to be Cipro for 14 days?

## 2014-09-23 LAB — URINE CULTURE: Organism ID, Bacteria: NO GROWTH

## 2014-10-07 ENCOUNTER — Other Ambulatory Visit: Payer: Self-pay | Admitting: *Deleted

## 2014-10-07 MED ORDER — LATANOPROST 0.005 % OP SOLN: 1.0000 [drp] | Freq: Every day | OPHTHALMIC | Status: DC

## 2014-10-20 ENCOUNTER — Ambulatory Visit (INDEPENDENT_AMBULATORY_CARE_PROVIDER_SITE_OTHER): Payer: Medicare HMO | Admitting: Pharmacist Clinician (PhC)/ Clinical Pharmacy Specialist

## 2014-10-20 DIAGNOSIS — Z952 Presence of prosthetic heart valve: Secondary | ICD-10-CM

## 2014-10-20 DIAGNOSIS — I482 Chronic atrial fibrillation, unspecified: Secondary | ICD-10-CM

## 2014-10-20 DIAGNOSIS — I4891 Unspecified atrial fibrillation: Secondary | ICD-10-CM

## 2014-10-20 DIAGNOSIS — Z954 Presence of other heart-valve replacement: Secondary | ICD-10-CM

## 2014-10-20 LAB — POCT INR: INR: 2.6

## 2014-10-22 ENCOUNTER — Encounter (HOSPITAL_COMMUNITY): Payer: Self-pay | Admitting: Internal Medicine

## 2014-11-09 ENCOUNTER — Encounter: Payer: Self-pay | Admitting: Family Medicine

## 2014-11-09 ENCOUNTER — Ambulatory Visit (INDEPENDENT_AMBULATORY_CARE_PROVIDER_SITE_OTHER): Payer: Medicare HMO | Admitting: Family Medicine

## 2014-11-09 VITALS — BP 119/70 | HR 82 | Temp 97.6°F | Ht 70.0 in | Wt 148.6 lb

## 2014-11-09 DIAGNOSIS — I10 Essential (primary) hypertension: Secondary | ICD-10-CM

## 2014-11-09 DIAGNOSIS — E785 Hyperlipidemia, unspecified: Secondary | ICD-10-CM

## 2014-11-09 LAB — POCT CBC
Granulocyte percent: 73.4 %G (ref 37–80)
HCT, POC: 42.3 % — AB (ref 43.5–53.7)
Hemoglobin: 13.3 g/dL — AB (ref 14.1–18.1)
Lymph, poc: 0.9 (ref 0.6–3.4)
MCH, POC: 30.5 pg (ref 27–31.2)
MCHC: 31.5 g/dL — AB (ref 31.8–35.4)
MCV: 96.8 fL (ref 80–97)
MPV: 7 fL (ref 0–99.8)
POC Granulocyte: 3.2 (ref 2–6.9)
POC LYMPH PERCENT: 21.9 %L (ref 10–50)
Platelet Count, POC: 156 10*3/uL (ref 142–424)
RBC: 4.4 M/uL — AB (ref 4.69–6.13)
RDW, POC: 13 %
WBC: 4.3 10*3/uL — AB (ref 4.6–10.2)

## 2014-11-09 NOTE — Progress Notes (Signed)
   Subjective:    Patient ID: [name], male    DOB: 03/08/1928, 80 y.o.   MRN: [id number]  HPI Patient is here for follow up.  He has been having some anxiety due to a solicitor calling him.  He has been doing fine otherwise.    Review of Systems  Constitutional: Negative for fever.  HENT: Negative for ear pain.   Eyes: Negative for discharge.  Respiratory: Negative for cough.   Cardiovascular: Negative for chest pain.  Gastrointestinal: Negative for abdominal distention.  Endocrine: Negative for polyuria.  Genitourinary: Negative for difficulty urinating.  Musculoskeletal: Negative for gait problem and neck pain.  Skin: Negative for color change and rash.  Neurological: Negative for speech difficulty and headaches.  Psychiatric/Behavioral: Negative for agitation.       Objective:    BP 119/70 mmHg  Pulse 82  Temp(Src) 97.6 F (36.4 C) (Oral)  Ht $R'5\' 10"'nP$  (1.778 m)  Wt 148 lb 9.6 oz (67.405 kg)  BMI 21.32 kg/m2 Physical Exam  Constitutional: He is oriented to person, place, and time. He appears well-developed and well-nourished.  HENT:  Head: Normocephalic and atraumatic.  Mouth/Throat: Oropharynx is clear and moist.  Eyes: Pupils are equal, round, and reactive to light.  Neck: Normal range of motion. Neck supple.  Cardiovascular: Normal rate and regular rhythm.   No murmur heard. Pulmonary/Chest: Effort normal and breath sounds normal.  Abdominal: Soft. Bowel sounds are normal. There is no tenderness.  Neurological: He is alert and oriented to person, place, and time.  Skin: Skin is warm and dry.  Psychiatric: He has a normal mood and affect.          Assessment & Plan:     ICD-9-CM ICD-10-CM   1. Essential hypertension, benign 401.1 I10 POCT CBC     CMP14+EGFR  2. Hyperlipemia 272.4 E78.5 Lipid panel     No Follow-up on file.  Lysbeth Penner FNP

## 2014-11-10 ENCOUNTER — Telehealth: Payer: Self-pay

## 2014-11-10 LAB — CMP14+EGFR
ALT: 11 IU/L (ref 0–44)
AST: 19 IU/L (ref 0–40)
Albumin/Globulin Ratio: 1.4 (ref 1.1–2.5)
Albumin: 4.2 g/dL (ref 3.5–4.7)
Alkaline Phosphatase: 80 IU/L (ref 39–117)
BUN/Creatinine Ratio: 10 (ref 10–22)
BUN: 14 mg/dL (ref 8–27)
CO2: 26 mmol/L (ref 18–29)
Calcium: 9.2 mg/dL (ref 8.6–10.2)
Chloride: 103 mmol/L (ref 97–108)
Creatinine, Ser: 1.35 mg/dL — ABNORMAL HIGH (ref 0.76–1.27)
GFR calc Af Amer: 55 mL/min/{1.73_m2} — ABNORMAL LOW (ref >59)
GFR calc non Af Amer: 48 mL/min/{1.73_m2} — ABNORMAL LOW (ref >59)
Globulin, Total: 3 g/dL (ref 1.5–4.5)
Glucose: 104 mg/dL — ABNORMAL HIGH (ref 65–99)
Potassium: 5 mmol/L (ref 3.5–5.2)
Sodium: 141 mmol/L (ref 134–144)
Total Bilirubin: 0.7 mg/dL (ref 0.0–1.2)
Total Protein: 7.2 g/dL (ref 6.0–8.5)

## 2014-11-10 LAB — LIPID PANEL
Chol/HDL Ratio: 2.2 ratio units (ref 0.0–5.0)
Cholesterol, Total: 127 mg/dL (ref 100–199)
HDL: 58 mg/dL (ref >39)
LDL Calculated: 53 mg/dL (ref 0–99)
Triglycerides: 80 mg/dL (ref 0–149)
VLDL Cholesterol Cal: 16 mg/dL (ref 5–40)

## 2014-11-10 NOTE — Telephone Encounter (Signed)
-----   Message from Deatra Canter, FNP sent at 11/10/2014 10:09 AM EST ----- Labs ok

## 2014-11-10 NOTE — Telephone Encounter (Signed)
Pt aware of results 

## 2014-12-01 ENCOUNTER — Other Ambulatory Visit: Payer: Self-pay | Admitting: *Deleted

## 2014-12-01 ENCOUNTER — Ambulatory Visit (INDEPENDENT_AMBULATORY_CARE_PROVIDER_SITE_OTHER): Payer: Commercial Managed Care - HMO | Admitting: Pharmacist Clinician (PhC)/ Clinical Pharmacy Specialist

## 2014-12-01 DIAGNOSIS — Z954 Presence of other heart-valve replacement: Secondary | ICD-10-CM | POA: Diagnosis not present

## 2014-12-01 DIAGNOSIS — I482 Chronic atrial fibrillation, unspecified: Secondary | ICD-10-CM

## 2014-12-01 DIAGNOSIS — I4891 Unspecified atrial fibrillation: Secondary | ICD-10-CM | POA: Diagnosis not present

## 2014-12-01 DIAGNOSIS — Z952 Presence of prosthetic heart valve: Secondary | ICD-10-CM

## 2014-12-01 LAB — POCT INR: INR: 2.2

## 2014-12-01 MED ORDER — METOPROLOL TARTRATE 25 MG PO TABS: 25.0000 mg | ORAL_TABLET | Freq: Two times a day (BID) | ORAL | Status: DC

## 2014-12-07 ENCOUNTER — Telehealth: Payer: Self-pay | Admitting: Family Medicine

## 2014-12-07 MED ORDER — EZETIMIBE 10 MG PO TABS: 10.0000 mg | ORAL_TABLET | Freq: Every day | ORAL | Status: DC

## 2014-12-07 NOTE — Telephone Encounter (Signed)
Patient aware that we do not have any samples of zetia.

## 2014-12-07 NOTE — Addendum Note (Signed)
Addended by: Tamera Punt on: 12/07/2014 04:03 PM   Modules accepted: Orders

## 2015-01-06 ENCOUNTER — Other Ambulatory Visit: Payer: Self-pay | Admitting: Family Medicine

## 2015-01-12 ENCOUNTER — Ambulatory Visit (INDEPENDENT_AMBULATORY_CARE_PROVIDER_SITE_OTHER): Payer: Commercial Managed Care - HMO | Admitting: Pharmacist Clinician (PhC)/ Clinical Pharmacy Specialist

## 2015-01-12 DIAGNOSIS — I482 Chronic atrial fibrillation, unspecified: Secondary | ICD-10-CM

## 2015-01-12 DIAGNOSIS — Z954 Presence of other heart-valve replacement: Secondary | ICD-10-CM

## 2015-01-12 DIAGNOSIS — Z952 Presence of prosthetic heart valve: Secondary | ICD-10-CM

## 2015-01-12 DIAGNOSIS — I4891 Unspecified atrial fibrillation: Secondary | ICD-10-CM

## 2015-01-12 LAB — POCT INR: INR: 2.1

## 2015-02-10 ENCOUNTER — Telehealth: Payer: Self-pay | Admitting: Family Medicine

## 2015-02-10 ENCOUNTER — Other Ambulatory Visit: Payer: Self-pay | Admitting: Family Medicine

## 2015-02-15 MED ORDER — LEVOTHYROXINE SODIUM 75 MCG PO TABS: 75.0000 ug | ORAL_TABLET | Freq: Every day | ORAL | Status: DC

## 2015-02-15 MED ORDER — PANTOPRAZOLE SODIUM 40 MG PO TBEC: DELAYED_RELEASE_TABLET | ORAL | Status: DC

## 2015-02-15 MED ORDER — METOPROLOL TARTRATE 25 MG PO TABS: 25.0000 mg | ORAL_TABLET | Freq: Two times a day (BID) | ORAL | Status: DC

## 2015-02-15 MED ORDER — SIMVASTATIN 40 MG PO TABS
40.0000 mg | ORAL_TABLET | Freq: Every day | ORAL | Status: DC
Start: 2015-02-15 — End: 2015-10-25

## 2015-02-15 NOTE — Telephone Encounter (Signed)
done

## 2015-02-17 ENCOUNTER — Encounter: Payer: Self-pay | Admitting: Pharmacist

## 2015-02-17 ENCOUNTER — Ambulatory Visit (INDEPENDENT_AMBULATORY_CARE_PROVIDER_SITE_OTHER): Payer: Commercial Managed Care - HMO | Admitting: Pharmacist

## 2015-02-17 VITALS — BP 125/70 | HR 75 | Ht 68.0 in | Wt 147.0 lb

## 2015-02-17 DIAGNOSIS — N183 Chronic kidney disease, stage 3 (moderate): Secondary | ICD-10-CM

## 2015-02-17 DIAGNOSIS — Z952 Presence of prosthetic heart valve: Secondary | ICD-10-CM

## 2015-02-17 DIAGNOSIS — Z1211 Encounter for screening for malignant neoplasm of colon: Secondary | ICD-10-CM | POA: Diagnosis not present

## 2015-02-17 DIAGNOSIS — Z954 Presence of other heart-valve replacement: Secondary | ICD-10-CM | POA: Diagnosis not present

## 2015-02-17 DIAGNOSIS — I482 Chronic atrial fibrillation, unspecified: Secondary | ICD-10-CM

## 2015-02-17 DIAGNOSIS — I480 Paroxysmal atrial fibrillation: Secondary | ICD-10-CM | POA: Diagnosis not present

## 2015-02-17 DIAGNOSIS — Z0000 Encounter for general adult medical examination without abnormal findings: Secondary | ICD-10-CM | POA: Diagnosis not present

## 2015-02-17 DIAGNOSIS — N1832 Chronic kidney disease, stage 3b: Secondary | ICD-10-CM | POA: Insufficient documentation

## 2015-02-17 LAB — POCT INR: INR: 3.1

## 2015-02-17 MED ORDER — METOPROLOL SUCCINATE ER 25 MG PO TB24: 25.0000 mg | ORAL_TABLET | Freq: Every day | ORAL | Status: DC

## 2015-02-17 NOTE — Patient Instructions (Addendum)
Anticoagulation Dose Instructions as of 02/17/2015      Dorene Grebe Tue Wed Thu Fri Sat   New Dose 3 mg 1.5 mg 3 mg 3 mg 3 mg 1.5 mg 3 mg    Description        * Goal 2.0-2.5 per cardiologist* Continue 1/2 tablet on Mondays and Fridays and 1 tablet all other days.      Call Dr Katy Fitch for comprehensive eye exam - recommend yearly eye exam Stool test given to take home. Return test to clinic when sample obtained.  Fall Prevention and Home Safety Falls cause injuries and can affect all age groups. It is possible to use preventive measures to significantly decrease the likelihood of falls. There are many simple measures which can make your home safer and prevent falls. OUTDOORS  Repair cracks and edges of walkways and driveways.  Remove high doorway thresholds.  Trim shrubbery on the main path into your home.  Have good outside lighting.  Clear walkways of tools, rocks, debris, and clutter.  Check that handrails are not broken and are securely fastened. Both sides of steps should have handrails.  Have leaves, snow, and ice cleared regularly.  Use sand or salt on walkways during winter months.  In the garage, clean up grease or oil spills. BATHROOM  Install night lights.  Install grab bars by the toilet and in the tub and shower.  Use non-skid mats or decals in the tub or shower.  Place a plastic non-slip stool in the shower to sit on, if needed.  Keep floors dry and clean up all water on the floor immediately.  Remove soap buildup in the tub or shower on a regular basis.  Secure bath mats with non-slip, double-sided rug tape.  Remove throw rugs and tripping hazards from the floors. BEDROOMS  Install night lights.  Make sure a bedside light is easy to reach.  Do not use oversized bedding.  Keep a telephone by your bedside.  Have a firm chair with side arms to use for getting dressed.  Remove throw rugs and tripping hazards from the floor. KITCHEN  Keep handles  on pots and pans turned toward the center of the stove. Use back burners when possible.  Clean up spills quickly and allow time for drying.  Avoid walking on wet floors.  Avoid hot utensils and knives.  Position shelves so they are not too high or low.  Place commonly used objects within easy reach.  If necessary, use a sturdy step stool with a grab bar when reaching.  Keep electrical cables out of the way.  Do not use floor polish or wax that makes floors slippery. If you must use wax, use non-skid floor wax.  Remove throw rugs and tripping hazards from the floor. STAIRWAYS  Never leave objects on stairs.  Place handrails on both sides of stairways and use them. Fix any loose handrails. Make sure handrails on both sides of the stairways are as long as the stairs.  Check carpeting to make sure it is firmly attached along stairs. Make repairs to worn or loose carpet promptly.  Avoid placing throw rugs at the top or bottom of stairways, or properly secure the rug with carpet tape to prevent slippage. Get rid of throw rugs, if possible.  Have an electrician put in a light switch at the top and bottom of the stairs. OTHER FALL PREVENTION TIPS  Wear low-heel or rubber-soled shoes that are supportive and fit well. Wear closed toe  shoes.  When using a stepladder, make sure it is fully opened and both spreaders are firmly locked. Do not climb a closed stepladder.  Add color or contrast paint or tape to grab bars and handrails in your home. Place contrasting color strips on first and last steps.  Learn and use mobility aids as needed. Install an electrical emergency response system.  Turn on lights to avoid dark areas. Replace light bulbs that burn out immediately. Get light switches that glow.  Arrange furniture to create clear pathways. Keep furniture in the same place.  Firmly attach carpet with non-skid or double-sided tape.  Eliminate uneven floor surfaces.  Select a  carpet pattern that does not visually hide the edge of steps.  Be aware of all pets. OTHER HOME SAFETY TIPS  Set the water temperature for 120 F (48.8 C).  Keep emergency numbers on or near the telephone.  Keep smoke detectors on every level of the home and near sleeping areas. Document Released: 10/20/2002 Document Revised: 04/30/2012 Document Reviewed: 01/19/2012 [facility] Patient Information 2015 [place], [place]. This information is not intended to replace advice given to you by your health care provider. Make sure you discuss any questions you have with your health care provider.  Preventive Care for Adults A healthy lifestyle and preventive care can promote health and wellness. Preventive health guidelines for men include the following key practices:  A routine yearly physical is a good way to check with your health care provider about your health and preventative screening. It is a chance to share any concerns and updates on your health and to receive a thorough exam.  Visit your dentist for a routine exam and preventative care every 6 months. Brush your teeth twice a day and floss once a day. Good oral hygiene prevents tooth decay and gum disease.  The frequency of eye exams is based on your age, health, family medical history, use of contact lenses, and other factors. Follow your health care provider's recommendations for frequency of eye exams.  Eat a healthy diet. Foods such as vegetables, fruits, whole grains, low-fat dairy products, and lean protein foods contain the nutrients you need without too many calories. Decrease your intake of foods high in solid fats, added sugars, and salt. Eat the right amount of calories for you.Get information about a proper diet from your health care provider, if necessary.  Regular physical exercise is one of the most important things you can do for your health. Most adults should get at least 150 minutes of moderate-intensity exercise (any  activity that increases your heart rate and causes you to sweat) each week. In addition, most adults need muscle-strengthening exercises on 2 or more days a week.  Maintain a healthy weight. The body mass index (BMI) is a screening tool to identify possible weight problems. It provides an estimate of body fat based on height and weight. Your health care provider can find your BMI and can help you achieve or maintain a healthy weight.For adults 20 years and older:  A BMI below 18.5 is considered underweight.  A BMI of 18.5 to 24.9 is normal.  A BMI of 25 to 29.9 is considered overweight.  A BMI of 30 and above is considered obese.  Maintain normal blood lipids and cholesterol levels by exercising and minimizing your intake of saturated fat. Eat a balanced diet with plenty of fruit and vegetables. Blood tests for lipids and cholesterol should begin at age 51 and be repeated every 5 years. If  your lipid or cholesterol levels are high, you are over 50, or you are at high risk for heart disease, you may need your cholesterol levels checked more frequently.Ongoing high lipid and cholesterol levels should be treated with medicines if diet and exercise are not working.  If you smoke, find out from your health care provider how to quit. If you do not use tobacco, do not start.  Lung cancer screening is recommended for adults aged 52-80 years who are at high risk for developing lung cancer because of a history of smoking. A yearly low-dose CT scan of the lungs is recommended for people who have at least a 30-pack-year history of smoking and are a current smoker or have quit within the past 15 years. A pack year of smoking is smoking an average of 1 pack of cigarettes a day for 1 year (for example: 1 pack a day for 30 years or 2 packs a day for 15 years). Yearly screening should continue until the smoker has stopped smoking for at least 15 years. Yearly screening should be stopped for people who develop a  health problem that would prevent them from having lung cancer treatment.  If you choose to drink alcohol, do not have more than 2 drinks per day. One drink is considered to be 12 ounces (355 mL) of beer, 5 ounces (148 mL) of wine, or 1.5 ounces (44 mL) of liquor.  Avoid use of street drugs. Do not share needles with anyone. Ask for help if you need support or instructions about stopping the use of drugs.  High blood pressure causes heart disease and increases the risk of stroke. Your blood pressure should be checked at least every 1-2 years. Ongoing high blood pressure should be treated with medicines, if weight loss and exercise are not effective.  If you are 7-42 years old, ask your health care provider if you should take aspirin to prevent heart disease.  Diabetes screening involves taking a blood sample to check your fasting blood sugar level. This should be done once every 3 years, after age 60, if you are within normal weight and without risk factors for diabetes. Testing should be considered at a younger age or be carried out more frequently if you are overweight and have at least 1 risk factor for diabetes.  Colorectal cancer can be detected and often prevented. Most routine colorectal cancer screening begins at the age of 87 and continues through age 74. However, your health care provider may recommend screening at an earlier age if you have risk factors for colon cancer. On a yearly basis, your health care provider may provide home test kits to check for hidden blood in the stool. Use of a small camera at the end of a tube to directly examine the colon (sigmoidoscopy or colonoscopy) can detect the earliest forms of colorectal cancer. Talk to your health care provider about this at age 19, when routine screening begins. Direct exam of the colon should be repeated every 5-10 years through age 39, unless early forms of precancerous polyps or small growths are found.  People who are at an  increased risk for hepatitis B should be screened for this virus. You are considered at high risk for hepatitis B if:  You were born in a country where hepatitis B occurs often. Talk with your health care provider about which countries are considered high risk.  Your parents were born in a high-risk country and you have not received a shot to  protect against hepatitis B (hepatitis B vaccine).  You have HIV or AIDS.  You use needles to inject street drugs.  You live with, or have sex with, someone who has hepatitis B.  You are a man who has sex with other men (MSM).  You get hemodialysis treatment.  You take certain medicines for conditions such as cancer, organ transplantation, and autoimmune conditions.  Hepatitis C blood testing is recommended for all people born from [birth year] through 1963 and any individual with known risks for hepatitis C.  Practice safe sex. Use condoms and avoid high-risk sexual practices to reduce the spread of sexually transmitted infections (STIs). STIs include gonorrhea, chlamydia, syphilis, trichomonas, herpes, HPV, and human immunodeficiency virus (HIV). Herpes, HIV, and HPV are viral illnesses that have no cure. They can result in disability, cancer, and death.  If you are at risk of being infected with HIV, it is recommended that you take a prescription medicine daily to prevent HIV infection. This is called preexposure prophylaxis (PrEP). You are considered at risk if:  You are a man who has sex with other men (MSM) and have other risk factors.  You are a heterosexual man, are sexually active, and are at increased risk for HIV infection.  You take drugs by injection.  You are sexually active with a partner who has HIV.  Talk with your health care provider about whether you are at high risk of being infected with HIV. If you choose to begin PrEP, you should first be tested for HIV. You should then be tested every 3 months for as long as you are taking  PrEP.  A one-time screening for abdominal aortic aneurysm (AAA) and surgical repair of large AAAs by ultrasound are recommended for men ages 37 to 75 years who are current or former smokers.  Healthy men should no longer receive prostate-specific antigen (PSA) blood tests as part of routine cancer screening. Talk with your health care provider about prostate cancer screening.  Testicular cancer screening is not recommended for adult males who have no symptoms. Screening includes self-exam, a health care provider exam, and other screening tests. Consult with your health care provider about any symptoms you have or any concerns you have about testicular cancer.  Use sunscreen. Apply sunscreen liberally and repeatedly throughout the day. You should seek shade when your shadow is shorter than you. Protect yourself by wearing long sleeves, pants, a wide-brimmed hat, and sunglasses year round, whenever you are outdoors.  Once a month, do a whole-body skin exam, using a mirror to look at the skin on your back. Tell your health care provider about new moles, moles that have irregular borders, moles that are larger than a pencil eraser, or moles that have changed in shape or color.  Stay current with required vaccines (immunizations).  Influenza vaccine. All adults should be immunized every year.  Tetanus, diphtheria, and acellular pertussis (Td, Tdap) vaccine. An adult who has not previously received Tdap or who does not know his vaccine status should receive 1 dose of Tdap. This initial dose should be followed by tetanus and diphtheria toxoids (Td) booster doses every 10 years. Adults with an unknown or incomplete history of completing a 3-dose immunization series with Td-containing vaccines should begin or complete a primary immunization series including a Tdap dose. Adults should receive a Td booster every 10 years.  Varicella vaccine. An adult without evidence of immunity to varicella should receive 2  doses or a second dose if he has previously  received 1 dose.  Human papillomavirus (HPV) vaccine. Males aged 43-21 years who have not received the vaccine previously should receive the 3-dose series. Males aged 22-26 years may be immunized. Immunization is recommended through the age of 39 years for any male who has sex with males and did not get any or all doses earlier. Immunization is recommended for any person with an immunocompromised condition through the age of 59 years if he did not get any or all doses earlier. During the 3-dose series, the second dose should be obtained 4-8 weeks after the first dose. The third dose should be obtained 24 weeks after the first dose and 16 weeks after the second dose.  Zoster vaccine. One dose is recommended for adults aged 26 years or older unless certain conditions are present.  Measles, mumps, and rubella (MMR) vaccine. Adults born before [birth year] generally are considered immune to measles and mumps. Adults born in [birth year] or later should have 1 or more doses of MMR vaccine unless there is a contraindication to the vaccine or there is laboratory evidence of immunity to each of the three diseases. A routine second dose of MMR vaccine should be obtained at least 28 days after the first dose for students attending postsecondary schools, health care workers, or international travelers. People who received inactivated measles vaccine or an unknown type of measles vaccine during 1963-1967 should receive 2 doses of MMR vaccine. People who received inactivated mumps vaccine or an unknown type of mumps vaccine before 1979 and are at high risk for mumps infection should consider immunization with 2 doses of MMR vaccine. Unvaccinated health care workers born before 65 who lack laboratory evidence of measles, mumps, or rubella immunity or laboratory confirmation of disease should consider measles and mumps immunization with 2 doses of MMR vaccine or rubella immunization with 1 dose  of MMR vaccine.  Pneumococcal 13-valent conjugate (PCV13) vaccine. When indicated, a person who is uncertain of his immunization history and has no record of immunization should receive the PCV13 vaccine. An adult aged 63 years or older who has certain medical conditions and has not been previously immunized should receive 1 dose of PCV13 vaccine. This PCV13 should be followed with a dose of pneumococcal polysaccharide (PPSV23) vaccine. The PPSV23 vaccine dose should be obtained at least 8 weeks after the dose of PCV13 vaccine. An adult aged 27 years or older who has certain medical conditions and previously received 1 or more doses of PPSV23 vaccine should receive 1 dose of PCV13. The PCV13 vaccine dose should be obtained 1 or more years after the last PPSV23 vaccine dose.  Pneumococcal polysaccharide (PPSV23) vaccine. When PCV13 is also indicated, PCV13 should be obtained first. All adults aged 2 years and older should be immunized. An adult younger than age 71 years who has certain medical conditions should be immunized. Any person who resides in a nursing home or long-term care facility should be immunized. An adult smoker should be immunized. People with an immunocompromised condition and certain other conditions should receive both PCV13 and PPSV23 vaccines. People with human immunodeficiency virus (HIV) infection should be immunized as soon as possible after diagnosis. Immunization during chemotherapy or radiation therapy should be avoided. Routine use of PPSV23 vaccine is not recommended for American Indians, [place]atives, or people younger than 65 years unless there are medical conditions that require PPSV23 vaccine. When indicated, people who have unknown immunization and have no record of immunization should receive PPSV23 vaccine. One-time revaccination 5 years after the  first dose of PPSV23 is recommended for people aged 19-64 years who have chronic kidney failure, nephrotic syndrome, asplenia,  or immunocompromised conditions. People who received 1-2 doses of PPSV23 before age 93 years should receive another dose of PPSV23 vaccine at age 62 years or later if at least 5 years have passed since the previous dose. Doses of PPSV23 are not needed for people immunized with PPSV23 at or after age 71 years.  Meningococcal vaccine. Adults with asplenia or persistent complement component deficiencies should receive 2 doses of quadrivalent meningococcal conjugate (MenACWY-D) vaccine. The doses should be obtained at least 2 months apart. Microbiologists working with certain meningococcal bacteria, [place] recruits, people at risk during an outbreak, and people who travel to or live in countries with a high rate of meningitis should be immunized. A first-year college student up through age 23 years who is living in a residence hall should receive a dose if he did not receive a dose on or after his 16th birthday. Adults who have certain high-risk conditions should receive one or more doses of vaccine.  Hepatitis A vaccine. Adults who wish to be protected from this disease, have certain high-risk conditions, work with hepatitis A-infected animals, work in hepatitis A research labs, or travel to or work in countries with a high rate of hepatitis A should be immunized. Adults who were previously unvaccinated and who anticipate close contact with an international adoptee during the first 60 days after arrival in the [place] from a country with a high rate of hepatitis A should be immunized.  Hepatitis B vaccine. Adults should be immunized if they wish to be protected from this disease, have certain high-risk conditions, may be exposed to blood or other infectious body fluids, are household contacts or sex partners of hepatitis B positive people, are clients or workers in certain care facilities, or travel to or work in countries with a high rate of hepatitis B.  Haemophilus influenzae type b (Hib) vaccine. A  previously unvaccinated person with asplenia or sickle cell disease or having a scheduled splenectomy should receive 1 dose of Hib vaccine. Regardless of previous immunization, a recipient of a hematopoietic stem cell transplant should receive a 3-dose series 6-12 months after his successful transplant. Hib vaccine is not recommended for adults with HIV infection. Preventive Service / Frequency Ages 46 and over  Blood pressure check.** / Every 1 to 2 years.  Lipid and cholesterol check.**/ Every 5 years beginning at age 63.  Lung cancer screening. / Every year if you are aged 33-80 years and have a 30-pack-year history of smoking and currently smoke or have quit within the past 15 years. Yearly screening is stopped once you have quit smoking for at least 15 years or develop a health problem that would prevent you from having lung cancer treatment.  Fecal occult blood test (FOBT) of stool. / Every year beginning at age 69 and continuing until age 26. You may not have to do this test if you get a colonoscopy every 10 years.  Flexible sigmoidoscopy** or colonoscopy.** / Every 5 years for a flexible sigmoidoscopy or every 10 years for a colonoscopy beginning at age 54 and continuing until age 29.  Hepatitis C blood test.** / For all people born from [birth year] through 1963 and any individual with known risks for hepatitis C.  Abdominal aortic aneurysm (AAA) screening.** / A one-time screening for ages 72 to 30 years who are current or former smokers.  Skin self-exam. / Monthly.  Influenza vaccine. / Every year.  Tetanus, diphtheria, and acellular pertussis (Tdap/Td) vaccine.** / 1 dose of Td every 10 years.  Varicella vaccine.** / Consult your health care provider.  Zoster vaccine.** / 1 dose for adults aged 21 years or older.  Pneumococcal 13-valent conjugate (PCV13) vaccine.** / Consult your health care provider.  Pneumococcal polysaccharide (PPSV23) vaccine.** / 1 dose for all adults aged 40  years and older.  Meningococcal vaccine.** / Consult your health care provider.  Hepatitis A vaccine.** / Consult your health care provider.  Hepatitis B vaccine.** / Consult your health care provider.  Haemophilus influenzae type b (Hib) vaccine.** / Consult your health care provider. **Family history and personal history of risk and conditions may change your health care provider's recommendations. Document Released: 12/26/2001 Document Revised: 11/04/2013 Document Reviewed: 03/27/2011 [facility] Patient Information 2015 [place], [place]. This information is not intended to replace advice given to you by your health care provider. Make sure you discuss any questions you have with your health care provider.

## 2015-02-17 NOTE — Progress Notes (Signed)
Patient ID: [name], male   DOB: November 07, 1928, 81 y.o.   MRN: [id number]    Subjective:   [name] is a 81 y.o. male who presents for an Initial Medicare Annual Wellness Visit.  Current Medications (verified) Outpatient Encounter Prescriptions as of 02/17/2015  Medication Sig  . aspirin EC 81 MG tablet Take 1 tablet (81 mg total) by mouth daily.  Marland Kitchen docusate sodium (COLACE) 100 MG capsule Take 100 mg by mouth daily.   Marland Kitchen ezetimibe (ZETIA) 10 MG tablet Take 1 tablet (10 mg total) by mouth daily.  Marland Kitchen latanoprost (XALATAN) 0.005 % ophthalmic solution Place 1 drop into the right eye at bedtime.  Marland Kitchen levothyroxine (SYNTHROID, LEVOTHROID) 75 MCG tablet Take 1 tablet (75 mcg total) by mouth daily.  . meclizine (ANTIVERT) 50 MG tablet Take 1 tablet (50 mg total) by mouth 3 (three) times daily as needed.  . Multiple Vitamin (MULTIVITAMIN) tablet Take 1 tablet by mouth daily.    . pantoprazole (PROTONIX) 40 MG tablet TAKE 1 TABLET (40 MG TOTAL) BY MOUTH DAILY.  . simvastatin (ZOCOR) 40 MG tablet Take 1 tablet (40 mg total) by mouth at bedtime.  . tamsulosin (FLOMAX) 0.4 MG CAPS capsule Take 1 capsule (0.4 mg total) by mouth 2 (two) times daily.  Marland Kitchen warfarin (COUMADIN) 3 MG tablet Take 3 mg daily except take 1.5 mg on mondays and fridays  . metoprolol succinate (TOPROL-XL) 25 MG 24 hr tablet Take 1 tablet (25 mg total) by mouth daily.  . [DISCONTINUED] metoprolol tartrate (LOPRESSOR) 25 MG tablet Take 1 tablet (25 mg total) by mouth 2 (two) times daily.    Allergies (verified) Ivp dye; Codeine; and Sulfonamide derivatives   History: Past Medical History  Diagnosis Date  . Aortic stenosis     St.Jude AVR in 1985; Echo 8/12: EF 50-55%, mild LVH, mech AV with mild AI, mean gradient 14 mmHg, mild to mod MR;  Echo 8/13: EF 60-65%, AVR ok with mean gradient 19 mmHg  . CAD (coronary artery disease)     Mild, nonobstructive at catheterization 5/01  . Personal history of subdural hematoma    Requiring craniotomy  . History of pulmonary embolism     post op DVT/PE;  s/p IVC filter  . Hyperlipidemia   . Hypothyroidism   . S/P cholecystectomy 2005  . CVA (cerebral infarction) 12/2010  . S/P shoulder surgery     left  . Carotid stenosis     Dopplers 8/13: RICA 40-50% and LICA 0-39% => followup 1 year  . DVT (deep venous thrombosis)   . Syncope     + carotid sinus massage  . Pacemaker  Biotronik    Past Surgical History  Procedure Laterality Date  . Aortic valve replacement  1985    St. Jude  . Cardiac catheterization    . Vascular surgery    . Brain surgery    . Permanent pacemaker insertion N/A 08/01/2012    Procedure: PERMANENT PACEMAKER INSERTION;  Surgeon: Duke Salvia, MD;  Location: [facility] CATH LAB;  Service: Cardiovascular;  Laterality: N/A;   Family History  Problem Relation Age of Onset  . Coronary artery disease Other   . Stroke Mother   . Cancer Father     Lip  . Heart disease Father     Three MI  . Heart attack Father   . Heart attack Brother   . Heart disease Brother   . Heart attack Brother   . Arthritis Sister  Social History   Occupational History  . Retired    Social History Main Topics  . Smoking status: Never Smoker   . Smokeless tobacco: Not on file  . Alcohol Use: No  . Drug Use: No  . Sexual Activity: Not on file    Do you feel safe at home?  Yes  Dietary issues and exercise activities discussed: Current Exercise Habits:: The patient does not participate in regular exercise at present, Type of exercise: Other - see comments (yardwork adn gardening), Intensity: Mild  Current Dietary habits:  Not following any special diet    Cardiac Risk Factors include: advanced age (>64men, >63 women);dyslipidemia  Objective:    Today's Vitals   02/17/15 1506  BP: 125/70  Pulse: 75  Height:  (1.727 m)  Weight: 147 lb (66.679 kg)  PainSc: 0-No pain   Body mass index is 22.36 kg/(m^2).   INR was 3.1 today in  office   Activities of Daily Living In your present state of health, do you have any difficulty performing the following activities: 02/17/2015  Is the patient deaf or have difficulty hearing? N  Hearing Y  Vision Y  Difficulty concentrating or making decisions N  Walking or climbing stairs? N  Preparing Food and eating ? N  Using the Toilet? N  In the past six months, have you accidently leaked urine? N  Do you have problems with loss of bowel control? N  Managing your Medications? N  Managing your Finances? N  Housekeeping or managing your Housekeeping? N    Are there smokers in your home (other than you)? No    Depression Screen PHQ 2/9 Scores 02/17/2015 11/09/2014 09/21/2014 05/06/2014  PHQ - 2 Score 1 0 2 0  PHQ- 9 Score - - 3 -    Fall Risk Fall Risk  02/17/2015 11/09/2014 09/21/2014 05/06/2014 03/17/2014  Falls in the past year? Yes No No No No  Number falls in past yr: 1 - - - -  Injury with Fall? No - - - -    Cognitive Function: MMSE - Mini Mental State Exam 02/17/2015  Orientation to time 5  Orientation to Place 5  Registration 3  Attention/ Calculation 4  Recall 0  Language- name 2 objects 2  Language- repeat 1  Language- follow 3 step command 3  Language- read & follow direction 1  Write a sentence 1  Copy design 0  Total score 25    Immunizations and Health Maintenance Immunization History  Administered Date(s) Administered  . Influenza,inj,Quad PF,36+ Mos 08/25/2013, 08/10/2014  . Pneumococcal Conjugate-13 08/10/2014   Health Maintenance Due  Topic Date Due  . Janet Berlin  07/14/1947    Patient Care Team: Ernestina Penna, MD as PCP - General (Family Medicine) Ernesto Rutherford, MD as Consulting Physician (Ophthalmology) Duke Salvia, MD as Consulting Physician (Cardiology)  Patient has also seen Dr. Gaynelle Arabian - Urologist in the past when he was at [facility]  Indicate any recent Medical Services you may have received from other than [facility]  providers in the past year (date may be approximate).    Assessment:    Annual Wellness Visit  Medication Management - patient is only taking metoprolol tartrate  qd however this is a bid form for metoprolol.   BP and heart rate is controlled currently   Screening Tests Health Maintenance  Topic Date Due  . TETANUS/TDAP  07/14/1949  . ZOSTAVAX  02/18/2016 (Originally 07/14/1990)  . COLONOSCOPY  05/27/2015  .  INFLUENZA VACCINE  06/14/2015  . 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  Patient also declines Zostavax due to cost.  Patient declines Tdap due to cost.  Patient has not received Pneumo 23 per EMR however he believes he has had in past.  Will review past paper records and recommend if unable to confirm receipt.   Colorectal cancer screening - Colonoscopy UTD;  FOBT given in office today  Cardiovascular disease screening -  UTD  Diabetes screening - UTD  Glaucoma screening /  Eye Exam -due recheck;  Patient reminded to make appt with Dr Dione Booze.    Continue with gardening and yardwork  Discussed fall prevention  Diet - patient instructed to eat 1-2 servings of green leafy vegetables per week to help stabilize INR  Change metoprolol tartrate to metoprolol succinate  1 tablet daily  Prostate cancer screening - UTD  Anticoagulation Dose Instructions as of 02/17/2015      [name] Tue Wed Thu Fri Sat   New Dose 3 mg 1.5 mg 3 mg 3 mg 3 mg 1.5 mg 3 mg    Description        * Goal 2.0-2.5 per cardiologist* Continue 1/2 tablet on Mondays and Fridays and 1 tablet all other days.        Patient Instructions (the written plan) were given to the patient.   Henrene Pastor,  [facility]   02/17/2015

## 2015-03-03 DIAGNOSIS — Z1211 Encounter for screening for malignant neoplasm of colon: Secondary | ICD-10-CM | POA: Diagnosis not present

## 2015-03-03 NOTE — Addendum Note (Signed)
Addended by: Orma Render F on: 03/03/2015 10:13 AM   Modules accepted: Orders

## 2015-03-04 LAB — FECAL OCCULT BLOOD, IMMUNOCHEMICAL: Fecal Occult Bld: NEGATIVE

## 2015-03-16 ENCOUNTER — Encounter: Payer: Self-pay | Admitting: *Deleted

## 2015-03-30 ENCOUNTER — Ambulatory Visit (INDEPENDENT_AMBULATORY_CARE_PROVIDER_SITE_OTHER): Payer: Commercial Managed Care - HMO | Admitting: Pharmacist Clinician (PhC)/ Clinical Pharmacy Specialist

## 2015-03-30 DIAGNOSIS — I482 Chronic atrial fibrillation, unspecified: Secondary | ICD-10-CM

## 2015-03-30 DIAGNOSIS — I480 Paroxysmal atrial fibrillation: Secondary | ICD-10-CM

## 2015-03-30 DIAGNOSIS — Z954 Presence of other heart-valve replacement: Secondary | ICD-10-CM | POA: Diagnosis not present

## 2015-03-30 DIAGNOSIS — Z952 Presence of prosthetic heart valve: Secondary | ICD-10-CM

## 2015-03-30 LAB — POCT INR: INR: 2.7

## 2015-03-30 NOTE — Patient Instructions (Signed)
Anticoagulation Dose Instructions as of 03/30/2015      [name] Tue Wed Thu Fri Sat   New Dose 3 mg 1.5 mg 3 mg 1.5 mg 3 mg 1.5 mg 3 mg    Description        * Goal 2.0-2.5 per cardiologist* Change to Monday, Wednesday and Friday 1/2 tablet and 1 tablet all other days of the week

## 2015-04-01 ENCOUNTER — Ambulatory Visit (INDEPENDENT_AMBULATORY_CARE_PROVIDER_SITE_OTHER): Payer: Commercial Managed Care - HMO | Admitting: *Deleted

## 2015-04-01 DIAGNOSIS — Z950 Presence of cardiac pacemaker: Secondary | ICD-10-CM | POA: Diagnosis not present

## 2015-04-01 DIAGNOSIS — I495 Sick sinus syndrome: Secondary | ICD-10-CM

## 2015-04-01 DIAGNOSIS — I480 Paroxysmal atrial fibrillation: Secondary | ICD-10-CM

## 2015-04-01 LAB — CUP PACEART INCLINIC DEVICE CHECK
Battery Remaining Percentage: 80 %
Brady Statistic RA Percent Paced: 57 %
Date Time Interrogation Session: 20160519115103
Lead Channel Impedance Value: 507 Ohm
Lead Channel Pacing Threshold Amplitude: 0.6 V
Lead Channel Pacing Threshold Pulse Width: 0.4 ms
Lead Channel Pacing Threshold Pulse Width: 0.4 ms
Lead Channel Sensing Intrinsic Amplitude: 13.9 mV
Lead Channel Sensing Intrinsic Amplitude: 3.5 mV
Lead Channel Setting Pacing Amplitude: 2 V
Lead Channel Setting Pacing Pulse Width: 0.4 ms
MDC IDC MSMT LEADCHNL RA IMPEDANCE VALUE: 448 Ohm
MDC IDC MSMT LEADCHNL RA PACING THRESHOLD AMPLITUDE: 0.7 V
MDC IDC PG SERIAL: 66244569
MDC IDC SET LEADCHNL RV PACING AMPLITUDE: 2 V
MDC IDC STAT BRADY RV PERCENT PACED: 4 %

## 2015-04-01 NOTE — Progress Notes (Signed)
Pacemaker check in clinic. Normal device function. Thresholds, sensing, impedances consistent with previous measurements. Device programmed to maximize longevity. 20 mode switches--- 0%, longest 10sec. No high ventricular rates noted. Device programmed at appropriate safety margins. Histogram distribution appropriate for patient activity level---CLS not changed per note. Device programmed to optimize intrinsic conduction. Estimated longevity 78yrs10mo. ROV w/ SK in 38mo.

## 2015-04-08 ENCOUNTER — Encounter: Payer: Self-pay | Admitting: Internal Medicine

## 2015-04-20 DIAGNOSIS — H401411 Capsular glaucoma with pseudoexfoliation of lens, right eye, mild stage: Secondary | ICD-10-CM | POA: Diagnosis not present

## 2015-04-20 DIAGNOSIS — Z961 Presence of intraocular lens: Secondary | ICD-10-CM | POA: Diagnosis not present

## 2015-04-20 DIAGNOSIS — H26493 Other secondary cataract, bilateral: Secondary | ICD-10-CM | POA: Diagnosis not present

## 2015-05-07 ENCOUNTER — Other Ambulatory Visit: Payer: Self-pay | Admitting: Family Medicine

## 2015-05-07 NOTE — Telephone Encounter (Signed)
Last sen 11/10/15 B Oxford

## 2015-05-11 ENCOUNTER — Ambulatory Visit (INDEPENDENT_AMBULATORY_CARE_PROVIDER_SITE_OTHER): Payer: Commercial Managed Care - HMO | Admitting: Pharmacist Clinician (PhC)/ Clinical Pharmacy Specialist

## 2015-05-11 DIAGNOSIS — I482 Chronic atrial fibrillation, unspecified: Secondary | ICD-10-CM

## 2015-05-11 DIAGNOSIS — Z952 Presence of prosthetic heart valve: Secondary | ICD-10-CM

## 2015-05-11 DIAGNOSIS — Z954 Presence of other heart-valve replacement: Secondary | ICD-10-CM

## 2015-05-11 DIAGNOSIS — I480 Paroxysmal atrial fibrillation: Secondary | ICD-10-CM

## 2015-05-11 LAB — POCT INR: INR: 2.3

## 2015-05-13 DIAGNOSIS — H26493 Other secondary cataract, bilateral: Secondary | ICD-10-CM | POA: Diagnosis not present

## 2015-05-21 DIAGNOSIS — H26491 Other secondary cataract, right eye: Secondary | ICD-10-CM | POA: Diagnosis not present

## 2015-05-28 ENCOUNTER — Other Ambulatory Visit: Payer: Self-pay | Admitting: Family Medicine

## 2015-05-31 ENCOUNTER — Telehealth: Payer: Self-pay | Admitting: Family Medicine

## 2015-06-29 ENCOUNTER — Ambulatory Visit (INDEPENDENT_AMBULATORY_CARE_PROVIDER_SITE_OTHER): Payer: Commercial Managed Care - HMO | Admitting: Pharmacist Clinician (PhC)/ Clinical Pharmacy Specialist

## 2015-06-29 DIAGNOSIS — Z954 Presence of other heart-valve replacement: Secondary | ICD-10-CM

## 2015-06-29 DIAGNOSIS — I480 Paroxysmal atrial fibrillation: Secondary | ICD-10-CM

## 2015-06-29 DIAGNOSIS — Z952 Presence of prosthetic heart valve: Secondary | ICD-10-CM

## 2015-06-29 DIAGNOSIS — I482 Chronic atrial fibrillation, unspecified: Secondary | ICD-10-CM

## 2015-06-29 LAB — POCT INR: INR: 2

## 2015-08-10 ENCOUNTER — Ambulatory Visit (INDEPENDENT_AMBULATORY_CARE_PROVIDER_SITE_OTHER): Payer: Commercial Managed Care - HMO | Admitting: Pharmacist Clinician (PhC)/ Clinical Pharmacy Specialist

## 2015-08-10 DIAGNOSIS — Z954 Presence of other heart-valve replacement: Secondary | ICD-10-CM

## 2015-08-10 DIAGNOSIS — Z952 Presence of prosthetic heart valve: Secondary | ICD-10-CM

## 2015-08-10 DIAGNOSIS — I480 Paroxysmal atrial fibrillation: Secondary | ICD-10-CM

## 2015-08-10 DIAGNOSIS — I482 Chronic atrial fibrillation, unspecified: Secondary | ICD-10-CM

## 2015-08-10 LAB — POCT INR: INR: 1.7

## 2015-08-27 ENCOUNTER — Ambulatory Visit (INDEPENDENT_AMBULATORY_CARE_PROVIDER_SITE_OTHER): Payer: Commercial Managed Care - HMO

## 2015-08-27 DIAGNOSIS — Z23 Encounter for immunization: Secondary | ICD-10-CM

## 2015-08-31 ENCOUNTER — Ambulatory Visit (INDEPENDENT_AMBULATORY_CARE_PROVIDER_SITE_OTHER): Payer: Commercial Managed Care - HMO | Admitting: Pharmacist Clinician (PhC)/ Clinical Pharmacy Specialist

## 2015-08-31 DIAGNOSIS — I482 Chronic atrial fibrillation, unspecified: Secondary | ICD-10-CM

## 2015-08-31 DIAGNOSIS — I480 Paroxysmal atrial fibrillation: Secondary | ICD-10-CM

## 2015-08-31 DIAGNOSIS — Z952 Presence of prosthetic heart valve: Secondary | ICD-10-CM

## 2015-08-31 DIAGNOSIS — Z954 Presence of other heart-valve replacement: Secondary | ICD-10-CM | POA: Diagnosis not present

## 2015-08-31 LAB — POCT INR: INR: 2.2

## 2015-09-14 ENCOUNTER — Encounter: Payer: Self-pay | Admitting: *Deleted

## 2015-09-22 ENCOUNTER — Encounter: Payer: Self-pay | Admitting: Internal Medicine

## 2015-09-22 ENCOUNTER — Ambulatory Visit (INDEPENDENT_AMBULATORY_CARE_PROVIDER_SITE_OTHER): Payer: Commercial Managed Care - HMO | Admitting: *Deleted

## 2015-09-22 DIAGNOSIS — Z950 Presence of cardiac pacemaker: Secondary | ICD-10-CM

## 2015-09-22 DIAGNOSIS — I495 Sick sinus syndrome: Secondary | ICD-10-CM

## 2015-09-22 DIAGNOSIS — I480 Paroxysmal atrial fibrillation: Secondary | ICD-10-CM | POA: Diagnosis not present

## 2015-09-22 DIAGNOSIS — G9001 Carotid sinus syncope: Secondary | ICD-10-CM

## 2015-09-22 LAB — CUP PACEART INCLINIC DEVICE CHECK
Brady Statistic RA Percent Paced: 56 %
Brady Statistic RV Percent Paced: 4 %
Implantable Lead Implant Date: 20130919
Implantable Lead Implant Date: 20130919
Implantable Lead Location: 753859
Implantable Lead Model: 5076
Lead Channel Impedance Value: 429 Ohm
Lead Channel Impedance Value: 507 Ohm
Lead Channel Pacing Threshold Amplitude: 0.8 V
Lead Channel Pacing Threshold Pulse Width: 0.4 ms
Lead Channel Sensing Intrinsic Amplitude: 14 mV
Lead Channel Sensing Intrinsic Amplitude: 14.1 mV
Lead Channel Sensing Intrinsic Amplitude: 3.9 mV
Lead Channel Setting Pacing Amplitude: 2 V
Lead Channel Setting Pacing Amplitude: 2.4 V
Lead Channel Setting Pacing Pulse Width: 0.4 ms
MDC IDC LEAD LOCATION: 753860
MDC IDC MSMT LEADCHNL RA PACING THRESHOLD AMPLITUDE: 0.8 V
MDC IDC MSMT LEADCHNL RA PACING THRESHOLD PULSEWIDTH: 0.4 ms
MDC IDC MSMT LEADCHNL RA SENSING INTR AMPL: 3.5 mV
MDC IDC MSMT LEADCHNL RA SENSING INTR AMPL: 3.9 mV
MDC IDC MSMT LEADCHNL RV PACING THRESHOLD AMPLITUDE: 0.6 V
MDC IDC MSMT LEADCHNL RV PACING THRESHOLD AMPLITUDE: 0.6 V
MDC IDC MSMT LEADCHNL RV PACING THRESHOLD PULSEWIDTH: 0.4 ms
MDC IDC MSMT LEADCHNL RV PACING THRESHOLD PULSEWIDTH: 0.4 ms
MDC IDC SESS DTM: 20161109174300
Pulse Gen Serial Number: 66244569

## 2015-09-23 NOTE — Progress Notes (Signed)
Pacemaker check in clinic. Normal device function. Thresholds, sensing, impedances consistent with previous measurements. Device programmed to maximize longevity. 20 atrial episode recordings, longest 32sec. No high ventricular rates noted. Device programmed at appropriate safety margins. Histogram distribution appropriate for patient activity level. Device programmed to optimize intrinsic conduction. Estimated longevity 34yr13mo. Today was suppose to be visit w/ SK. New recall created for ROV w/ SK in 69mo.

## 2015-09-24 ENCOUNTER — Other Ambulatory Visit: Payer: Self-pay | Admitting: Family Medicine

## 2015-09-27 ENCOUNTER — Telehealth: Payer: Self-pay | Admitting: Family Medicine

## 2015-09-27 NOTE — Telephone Encounter (Signed)
Last seen by provider12/15 B Oxford   Last thyroid level 05/06/14

## 2015-09-27 NOTE — Telephone Encounter (Signed)
Last seen 11/09/14  B Oxford

## 2015-10-04 ENCOUNTER — Telehealth: Payer: Self-pay | Admitting: Family Medicine

## 2015-10-04 MED ORDER — LEVOTHYROXINE SODIUM 75 MCG PO TABS: 75.0000 ug | ORAL_TABLET | Freq: Every day | ORAL | Status: DC

## 2015-10-04 MED ORDER — PANTOPRAZOLE SODIUM 40 MG PO TBEC
DELAYED_RELEASE_TABLET | ORAL | Status: DC
Start: 2015-10-04 — End: 2015-12-15

## 2015-10-04 NOTE — Telephone Encounter (Signed)
[name], Dr. Christell Constant sent 30 days of these to [facility], needs 90 days. His last TSH was 04/2014, last seen by oxford 12/15

## 2015-10-04 NOTE — Telephone Encounter (Signed)
Answered sons questions,

## 2015-10-14 ENCOUNTER — Ambulatory Visit (INDEPENDENT_AMBULATORY_CARE_PROVIDER_SITE_OTHER): Payer: Commercial Managed Care - HMO | Admitting: Family Medicine

## 2015-10-14 ENCOUNTER — Encounter: Payer: Self-pay | Admitting: Family Medicine

## 2015-10-14 VITALS — BP 120/67 | HR 73 | Temp 97.3°F | Ht 68.0 in | Wt 146.0 lb

## 2015-10-14 DIAGNOSIS — Z0000 Encounter for general adult medical examination without abnormal findings: Secondary | ICD-10-CM

## 2015-10-14 DIAGNOSIS — E785 Hyperlipidemia, unspecified: Secondary | ICD-10-CM

## 2015-10-14 DIAGNOSIS — Z125 Encounter for screening for malignant neoplasm of prostate: Secondary | ICD-10-CM | POA: Diagnosis not present

## 2015-10-14 DIAGNOSIS — E559 Vitamin D deficiency, unspecified: Secondary | ICD-10-CM

## 2015-10-14 DIAGNOSIS — E039 Hypothyroidism, unspecified: Secondary | ICD-10-CM

## 2015-10-14 DIAGNOSIS — I482 Chronic atrial fibrillation, unspecified: Secondary | ICD-10-CM

## 2015-10-14 DIAGNOSIS — N183 Chronic kidney disease, stage 3 (moderate): Secondary | ICD-10-CM

## 2015-10-14 DIAGNOSIS — N1832 Chronic kidney disease, stage 3b: Secondary | ICD-10-CM

## 2015-10-14 MED ORDER — CETAPHIL MOISTURIZING EX LOTN: 1.0000 "application " | TOPICAL_LOTION | CUTANEOUS | Status: DC | PRN

## 2015-10-14 MED ORDER — FLUOCINONIDE-E 0.05 % EX CREA: 1.0000 "application " | TOPICAL_CREAM | Freq: Two times a day (BID) | CUTANEOUS | Status: DC

## 2015-10-14 NOTE — Patient Instructions (Signed)
Apply the prescription cream fluocinonide to the itchy areas of your back and legs in the morning and in the evening.  Apply the Cetaphil lotion to your back and other dry areas of skin that are itchy every night before bed.  When you're nose gets runny like it was last week you can use Afrin for 3-5 days 3 times a day. As long as it is clear that is okay, if he gets thick and colored green or yellow, you should be seen in the office

## 2015-10-14 NOTE — Progress Notes (Signed)
10/17/2015   Subjective:   [name] is a 81 y.o. male who presents for an Initial Medicare Annual Wellness Visit.  Review of Systems  Review of Systems  Constitutional: Positive for malaise/fatigue. Negative for fever, chills, weight loss and diaphoresis.  HENT: Negative for congestion, ear pain, hearing loss, nosebleeds, sore throat and tinnitus.   Eyes: Negative for blurred vision, double vision, photophobia, pain, discharge and redness.  Respiratory: Negative for cough, hemoptysis, sputum production, shortness of breath and wheezing.   Cardiovascular: Negative for chest pain, palpitations, orthopnea and leg swelling.  Gastrointestinal: Negative for heartburn, nausea, vomiting, abdominal pain, diarrhea, constipation, blood in stool and melena.  Genitourinary: Negative for dysuria, urgency, frequency, hematuria and flank pain.  Musculoskeletal: Positive for myalgias and joint pain. Negative for back pain, falls and neck pain.  Skin: Negative for itching and rash.  Neurological: Negative for dizziness, tingling, tremors, sensory change, speech change, focal weakness, seizures, loss of consciousness, weakness and headaches.  Endo/Heme/Allergies: Negative for environmental allergies and polydipsia. Does not bruise/bleed easily.  Psychiatric/Behavioral: Negative for depression, suicidal ideas, hallucinations, memory loss and substance abuse. The patient is not nervous/anxious and does not have insomnia.         Current Medications (verified) Outpatient Encounter Prescriptions as of 10/14/2015  Medication Sig  . aspirin EC 81 MG tablet Take 1 tablet (81 mg total) by mouth daily.  Marland Kitchen COUMADIN 3 MG tablet TAKE 1 DAILY EXCEPT 1/2 TABLET ON MONDAY AND FRIDAY  . docusate sodium (COLACE) 100 MG capsule Take 100 mg by mouth daily.   Marland Kitchen ezetimibe (ZETIA) 10 MG tablet Take 1 tablet (10 mg total) by mouth daily.  Marland Kitchen latanoprost (XALATAN) 0.005 % ophthalmic solution INSTILL 1 DROP INTO RIGHT  EYE AT BEDTIME - BEGIN NEW BOTTLE EVERY 42 DAYS  . levothyroxine (SYNTHROID, LEVOTHROID) 75 MCG tablet Take 1 tablet (75 mcg total) by mouth daily.  . meclizine (ANTIVERT) 50 MG tablet Take 1 tablet (50 mg total) by mouth 3 (three) times daily as needed.  . metoprolol succinate (TOPROL-XL) 25 MG 24 hr tablet Take 1 tablet (25 mg total) by mouth daily.  . Multiple Vitamin (MULTIVITAMIN) tablet Take 1 tablet by mouth daily.    . pantoprazole (PROTONIX) 40 MG tablet TAKE 1 TABLET (40 MG TOTAL) BY MOUTH DAILY.  . simvastatin (ZOCOR) 40 MG tablet Take 1 tablet (40 mg total) by mouth at bedtime.  . tamsulosin (FLOMAX) 0.4 MG CAPS capsule Take 1 capsule (0.4 mg total) by mouth 2 (two) times daily.  . cetaphil (CETAPHIL) lotion Apply 1 application topically as needed for dry skin.  . fluocinonide-emollient (LIDEX-E) 0.05 % cream Apply 1 application topically 2 (two) times daily.   No facility-administered encounter medications on file as of 10/14/2015.    Allergies (verified) Ivp dye; Codeine; and Sulfonamide derivatives   History: Past Medical History  Diagnosis Date  . Aortic stenosis     St.Jude AVR in 1985; Echo 8/12: EF 50-55%, mild LVH, mech AV with mild AI, mean gradient 14 mmHg, mild to mod MR;  Echo 8/13: EF 60-65%, AVR ok with mean gradient 19 mmHg  . CAD (coronary artery disease)     Mild, nonobstructive at catheterization 5/01  . Personal history of subdural hematoma     Requiring craniotomy  . History of pulmonary embolism     post op DVT/PE;  s/p IVC filter  . Hyperlipidemia   . Hypothyroidism   . S/P cholecystectomy 2005  . CVA (cerebral infarction) 12/2010  .  S/P shoulder surgery     left  . Carotid stenosis     Dopplers 8/13: RICA 40-50% and LICA 0-39% => followup 1 year  . DVT (deep venous thrombosis) (HCC)   . Syncope     + carotid sinus massage  . Pacemaker  Biotronik    Past Surgical History  Procedure Laterality Date  . Aortic valve replacement  1985    St.  Jude  . Cardiac catheterization    . Vascular surgery    . Brain surgery    . Permanent pacemaker insertion N/A 08/01/2012    Procedure: PERMANENT PACEMAKER INSERTION;  Surgeon: Duke Salvia, MD;  Location: [facility] CATH LAB;  Service: Cardiovascular;  Laterality: N/A;   Family History  Problem Relation Age of Onset  . Coronary artery disease Other   . Stroke Mother   . Cancer Father     Lip  . Heart disease Father     Three MI  . Heart attack Father   . Heart attack Brother   . Heart disease Brother   . Heart attack Brother   . Arthritis Sister    Social History   Occupational History  . Retired    Social History Main Topics  . Smoking status: Never Smoker   . Smokeless tobacco: Not on file  . Alcohol Use: No  . Drug Use: No  . Sexual Activity: Not on file    Do you feel safe at home?  Yes  Dietary issues and exercise activities discussed:    Current Dietary habits:  Eating 3 regular meals a day. They're not always highly nutritious but he and his wife attempt to  prepare meals as best they can. She has been hospitalized quite a bit recently and his diet has not been as stable.       Objective:    Today's Vitals   10/14/15 0851  BP: 120/67  Pulse: 73  Temp: 97.3 F (36.3 C)  TempSrc: Oral  Height:  (1.727 m)  Weight: 146 lb (66.225 kg)   Body mass index is 22.2 kg/(m^2).   Physical Exam  Constitutional: He is oriented to person, place, and time. No distress.  Febrile with a stooped posture  HENT:  Head: Normocephalic and atraumatic.  Right Ear: External ear normal.  Left Ear: External ear normal.  Nose: Nose normal.  Mouth/Throat: Oropharynx is clear and moist.  Eyes: Conjunctivae and EOM are normal. Pupils are equal, round, and reactive to light. No scleral icterus.  Neck: Normal range of motion. Neck supple. No JVD present. No tracheal deviation present. No thyromegaly present.  Cardiovascular: Normal rate, regular rhythm and normal heart  sounds.  Exam reveals no gallop and no friction rub.   No murmur heard. Pulmonary/Chest: Effort normal and breath sounds normal. No respiratory distress. He has no wheezes. He has no rales. He exhibits no tenderness.  Abdominal: Soft. Bowel sounds are normal. He exhibits no distension and no mass. There is no tenderness. There is no rebound and no guarding.  Musculoskeletal: Normal range of motion. He exhibits no edema or tenderness.  Slightly stooped posture  Lymphadenopathy:    He has no cervical adenopathy.  Neurological: He is alert and oriented to person, place, and time. He displays normal reflexes. No cranial nerve deficit. He exhibits normal muscle tone. Coordination normal.  Skin: Skin is warm and dry. No rash noted. He is not diaphoretic. No erythema.  Psychiatric: Mood, memory, affect and judgment normal.  Vitals reviewed.  Activities of Daily Living In your present state of health, do you have any difficulty performing the following activities: 02/17/2015  Hearing? N  Vision? Y  Difficulty concentrating or making decisions? Y  Walking or climbing stairs? N  Dressing or bathing? N  Preparing Food and eating ? N  Using the Toilet? N  In the past six months, have you accidently leaked urine? N  Do you have problems with loss of bowel control? N  Managing your Medications? N  Managing your Finances? N  Housekeeping or managing your Housekeeping? N    Are there smokers in your home (other than you)? No    Depression Screen PHQ 2/9 Scores 10/14/2015 02/17/2015 11/09/2014 09/21/2014  PHQ - 2 Score 0 1 0 2  PHQ- 9 Score - - - 3    Fall Risk Fall Risk  10/14/2015 02/17/2015 11/09/2014 09/21/2014 05/06/2014  Falls in the past year? No Yes No No No  Number falls in past yr: - 1 - - -  Injury with Fall? - No - - -  Follow up - Falls prevention discussed;Falls evaluation completed - - -    Cognitive Function: MMSE - Mini Mental State Exam 02/17/2015  Orientation to time 5    Orientation to Place 5  Registration 3  Attention/ Calculation 4  Recall 0  Language- name 2 objects 2  Language- repeat 1  Language- follow 3 step command 3  Language- read & follow direction 1  Write a sentence 1  Copy design 0  Total score 25    Immunizations and Health Maintenance Immunization History  Administered Date(s) Administered  . Influenza,inj,Quad PF,36+ Mos 08/25/2013, 08/10/2014, 08/27/2015  . Pneumococcal Conjugate-13 08/10/2014   Health Maintenance Due  Topic Date Due  . TETANUS/TDAP  07/14/1949  . PNA vac Low Risk Adult (2 of 2 - PPSV23) 08/11/2015    Patient Care Team: Ernestina Penna, MD as PCP - General (Family Medicine) Ernesto Rutherford, MD as Consulting Physician (Ophthalmology) Duke Salvia, MD as Consulting Physician (Cardiology)  Indicate any recent Medical Services you may have received from other than [facility] providers in the past year (date may be approximate).    Assessment:    Annual Wellness Visit    Screening Tests Health Maintenance  Topic Date Due  . TETANUS/TDAP  07/14/1949  . PNA vac Low Risk Adult (2 of 2 - PPSV23) 08/11/2015  . ZOSTAVAX  02/18/2016 (Originally 07/14/1990)  . INFLUENZA VACCINE  06/13/2016        Plan:   During the course of the visit Sameer was educated and counseled about the following appropriate screening and preventive services:   Vaccines to include Pneumoccal, Influenza, Hepatitis B, Td, Zostavax,  Colorectal cancer screening  Cardiovascular disease screening  Diabetes screening  Bone Denisty / Osteoporosis Screening  Glaucoma screening / Diabetic Eye Exam  Nutrition counseling  Prostate cancer screening  Smoking cessation counseling   Goals    . Decrease the likelihood of falling     Removing rugs from the home. Making sure he wears slippers with rubber soles. Removing obstacles from walking areas of the home etc.    . Eat breakfast    . Have 3 meals a day    . Increase physical  activity     Try walking more for exercise    . Reduce sodium intake      Apply the prescription cream fluocinonide to the itchy areas of your back and legs in the morning and in  the evening.  Apply the Cetaphil lotion to your back and other dry areas of skin that are itchy every night before bed.  When you're nose gets runny like it was last week you can use Afrin for 3-5 days 3 times a day. As long as it is clear that is okay, if he gets thick and colored green or yellow, you should be seen in the office   Patient Instructions (the written plan) were given to the patient.   [name], MD   10/17/2015

## 2015-10-19 ENCOUNTER — Ambulatory Visit (INDEPENDENT_AMBULATORY_CARE_PROVIDER_SITE_OTHER): Payer: Commercial Managed Care - HMO | Admitting: Pharmacist Clinician (PhC)/ Clinical Pharmacy Specialist

## 2015-10-19 DIAGNOSIS — I482 Chronic atrial fibrillation, unspecified: Secondary | ICD-10-CM

## 2015-10-19 DIAGNOSIS — I480 Paroxysmal atrial fibrillation: Secondary | ICD-10-CM | POA: Diagnosis not present

## 2015-10-19 DIAGNOSIS — E039 Hypothyroidism, unspecified: Secondary | ICD-10-CM | POA: Diagnosis not present

## 2015-10-19 DIAGNOSIS — E559 Vitamin D deficiency, unspecified: Secondary | ICD-10-CM | POA: Diagnosis not present

## 2015-10-19 DIAGNOSIS — Z125 Encounter for screening for malignant neoplasm of prostate: Secondary | ICD-10-CM | POA: Diagnosis not present

## 2015-10-19 DIAGNOSIS — N183 Chronic kidney disease, stage 3 (moderate): Secondary | ICD-10-CM | POA: Diagnosis not present

## 2015-10-19 DIAGNOSIS — Z952 Presence of prosthetic heart valve: Secondary | ICD-10-CM

## 2015-10-19 DIAGNOSIS — Z954 Presence of other heart-valve replacement: Secondary | ICD-10-CM | POA: Diagnosis not present

## 2015-10-19 DIAGNOSIS — E785 Hyperlipidemia, unspecified: Secondary | ICD-10-CM | POA: Diagnosis not present

## 2015-10-19 LAB — POCT INR: INR: 2.1

## 2015-10-19 NOTE — Addendum Note (Signed)
Addended by: Prescott Gum on: 10/19/2015 08:26 AM   Modules accepted: Orders

## 2015-10-19 NOTE — Patient Instructions (Signed)
Anticoagulation Dose Instructions as of 10/19/2015      [name] Tue Wed Thu Fri Sat   New Dose 3 mg 1.5 mg 3 mg 1.5 mg 3 mg 1.5 mg 3 mg    Description        * Goal 2.0-2.5 per cardiologist* Continue same schedule       INR today was 2.1

## 2015-10-19 NOTE — Addendum Note (Signed)
Addended by: Prescott Gum on: 10/19/2015 08:09 AM   Modules accepted: Kipp Brood

## 2015-10-20 LAB — CBC WITH DIFFERENTIAL/PLATELET
BASOS: 0 %
Basophils Absolute: 0 10*3/uL (ref 0.0–0.2)
EOS (ABSOLUTE): 0.1 10*3/uL (ref 0.0–0.4)
Eos: 3 %
Hematocrit: 38.9 % (ref 37.5–51.0)
Hemoglobin: 12.7 g/dL (ref 12.6–17.7)
IMMATURE GRANS (ABS): 0 10*3/uL (ref 0.0–0.1)
IMMATURE GRANULOCYTES: 0 %
LYMPHS: 27 %
Lymphocytes Absolute: 1.1 10*3/uL (ref 0.7–3.1)
MCH: 32.2 pg (ref 26.6–33.0)
MCHC: 32.6 g/dL (ref 31.5–35.7)
MCV: 99 fL — AB (ref 79–97)
Monocytes Absolute: 0.5 10*3/uL (ref 0.1–0.9)
Monocytes: 11 %
NEUTROS PCT: 59 %
Neutrophils Absolute: 2.3 10*3/uL (ref 1.4–7.0)
PLATELETS: 155 10*3/uL (ref 150–379)
RBC: 3.95 x10E6/uL — AB (ref 4.14–5.80)
RDW: 13.3 % (ref 12.3–15.4)
WBC: 4 10*3/uL (ref 3.4–10.8)

## 2015-10-20 LAB — PSA TOTAL (REFLEX TO FREE): Prostate Specific Ag, Serum: 4.4 ng/mL — ABNORMAL HIGH (ref 0.0–4.0)

## 2015-10-20 LAB — CMP14+EGFR
ALBUMIN: 4.1 g/dL (ref 3.5–4.7)
ALT: 11 IU/L (ref 0–44)
AST: 20 IU/L (ref 0–40)
Albumin/Globulin Ratio: 1.5 (ref 1.1–2.5)
Alkaline Phosphatase: 82 IU/L (ref 39–117)
BUN/Creatinine Ratio: 12 (ref 10–22)
BUN: 13 mg/dL (ref 8–27)
Bilirubin Total: 0.7 mg/dL (ref 0.0–1.2)
CALCIUM: 9 mg/dL (ref 8.6–10.2)
CO2: 26 mmol/L (ref 18–29)
CREATININE: 1.11 mg/dL (ref 0.76–1.27)
Chloride: 103 mmol/L (ref 97–106)
GFR, EST AFRICAN AMERICAN: 70 mL/min/{1.73_m2} (ref >59)
GFR, EST NON AFRICAN AMERICAN: 60 mL/min/{1.73_m2} (ref >59)
GLUCOSE: 95 mg/dL (ref 65–99)
Globulin, Total: 2.8 g/dL (ref 1.5–4.5)
Potassium: 4.8 mmol/L (ref 3.5–5.2)
Sodium: 142 mmol/L (ref 136–144)
TOTAL PROTEIN: 6.9 g/dL (ref 6.0–8.5)

## 2015-10-20 LAB — NMR, LIPOPROFILE
CHOLESTEROL: 137 mg/dL (ref 100–199)
HDL CHOLESTEROL BY NMR: 61 mg/dL (ref >39)
HDL PARTICLE NUMBER: 38.6 umol/L (ref >=30.5)
LDL Particle Number: 738 nmol/L (ref <1000)
LDL Size: 20.9 nm (ref >20.5)
LDL-C: 63 mg/dL (ref 0–99)
LP-IR Score: 42 (ref <=45)
SMALL LDL PARTICLE NUMBER: 306 nmol/L (ref <=527)
TRIGLYCERIDES BY NMR: 63 mg/dL (ref 0–149)

## 2015-10-20 LAB — VITAMIN D 25 HYDROXY (VIT D DEFICIENCY, FRACTURES): Vit D, 25-Hydroxy: 36 ng/mL (ref 30.0–100.0)

## 2015-10-20 LAB — TSH+FREE T4
FREE T4: 1.42 ng/dL (ref 0.82–1.77)
TSH: 0.507 u[IU]/mL (ref 0.450–4.500)

## 2015-10-20 LAB — FPSA% REFLEX
% FREE PSA: 17 %
PSA, FREE: 0.75 ng/mL

## 2015-10-20 NOTE — Addendum Note (Signed)
Addended by: [name] on: 10/20/2015 05:52 PM   Modules accepted: Kipp Brood

## 2015-10-25 ENCOUNTER — Other Ambulatory Visit: Payer: Self-pay | Admitting: Family Medicine

## 2015-11-30 ENCOUNTER — Other Ambulatory Visit: Payer: Self-pay | Admitting: Pharmacist Clinician (PhC)/ Clinical Pharmacy Specialist

## 2015-11-30 ENCOUNTER — Ambulatory Visit (INDEPENDENT_AMBULATORY_CARE_PROVIDER_SITE_OTHER): Payer: Commercial Managed Care - HMO | Admitting: Pharmacist Clinician (PhC)/ Clinical Pharmacy Specialist

## 2015-11-30 DIAGNOSIS — I480 Paroxysmal atrial fibrillation: Secondary | ICD-10-CM | POA: Diagnosis not present

## 2015-11-30 DIAGNOSIS — I482 Chronic atrial fibrillation, unspecified: Secondary | ICD-10-CM

## 2015-11-30 DIAGNOSIS — Z954 Presence of other heart-valve replacement: Secondary | ICD-10-CM | POA: Diagnosis not present

## 2015-11-30 DIAGNOSIS — Z952 Presence of prosthetic heart valve: Secondary | ICD-10-CM

## 2015-11-30 LAB — POCT INR: INR: 2

## 2015-11-30 NOTE — Patient Instructions (Signed)
Anticoagulation Dose Instructions as of 11/30/2015      [name] Tue Wed Thu Fri Sat   New Dose 3 mg 1.5 mg 3 mg 1.5 mg 3 mg 1.5 mg 3 mg    Description        * Goal 2.0-2.5 per cardiologist* Continue same schedule

## 2015-12-15 ENCOUNTER — Other Ambulatory Visit: Payer: Self-pay | Admitting: Family Medicine

## 2016-01-11 ENCOUNTER — Other Ambulatory Visit: Payer: Self-pay | Admitting: Family Medicine

## 2016-01-11 ENCOUNTER — Ambulatory Visit (INDEPENDENT_AMBULATORY_CARE_PROVIDER_SITE_OTHER): Payer: Commercial Managed Care - HMO | Admitting: Pharmacist Clinician (PhC)/ Clinical Pharmacy Specialist

## 2016-01-11 DIAGNOSIS — Z954 Presence of other heart-valve replacement: Secondary | ICD-10-CM | POA: Diagnosis not present

## 2016-01-11 DIAGNOSIS — I482 Chronic atrial fibrillation, unspecified: Secondary | ICD-10-CM

## 2016-01-11 DIAGNOSIS — Z952 Presence of prosthetic heart valve: Secondary | ICD-10-CM

## 2016-01-11 DIAGNOSIS — I480 Paroxysmal atrial fibrillation: Secondary | ICD-10-CM

## 2016-01-11 LAB — POCT INR: INR: 2.2

## 2016-01-24 ENCOUNTER — Telehealth: Payer: Self-pay | Admitting: Family Medicine

## 2016-01-24 MED ORDER — LATANOPROST 0.005 % OP SOLN: OPHTHALMIC | Status: DC

## 2016-01-24 NOTE — Telephone Encounter (Signed)
done

## 2016-01-27 ENCOUNTER — Encounter: Payer: Self-pay | Admitting: Family Medicine

## 2016-01-27 ENCOUNTER — Ambulatory Visit (INDEPENDENT_AMBULATORY_CARE_PROVIDER_SITE_OTHER): Payer: Commercial Managed Care - HMO | Admitting: Family Medicine

## 2016-01-27 VITALS — BP 123/66 | HR 79 | Temp 97.1°F | Ht 68.0 in | Wt 147.4 lb

## 2016-01-27 DIAGNOSIS — R1032 Left lower quadrant pain: Secondary | ICD-10-CM

## 2016-01-27 NOTE — Progress Notes (Signed)
Patient ID: [name], male   DOB: 04-Aug-1928, 82 y.o.   MRN: [id number]  Primary Physician: [name], MD  Chief Complaint: Patient noticed pain that is intermittent and left scrotal 1 recently. He has noted no swelling in the area. Pain occurs sporadically without any particular activity. He has a history of DVT pulmonary embolus. There is been no erythema or heat in the area in question.     Past Medical History  Diagnosis Date  . Aortic stenosis     St.Jude AVR in 1985; Echo 8/12: EF 50-55%, mild LVH, mech AV with mild AI, mean gradient 14 mmHg, mild to mod MR;  Echo 8/13: EF 60-65%, AVR ok with mean gradient 19 mmHg  . CAD (coronary artery disease)     Mild, nonobstructive at catheterization 5/01  . Personal history of subdural hematoma     Requiring craniotomy  . History of pulmonary embolism     post op DVT/PE;  s/p IVC filter  . Hyperlipidemia   . Hypothyroidism   . S/P cholecystectomy 2005  . CVA (cerebral infarction) 12/2010  . S/P shoulder surgery     left  . Carotid stenosis     Dopplers 8/13: RICA 40-50% and LICA 0-39% => followup 1 year  . DVT (deep venous thrombosis) (HCC)   . Syncope     + carotid sinus massage  . Pacemaker  Biotronik      Home Meds: Prior to Admission medications   Medication Sig Start Date End Date Taking? Authorizing Provider  aspirin EC 81 MG tablet Take 1 tablet (81 mg total) by mouth daily. 06/08/11  Yes Laurey Morale, MD  cetaphil (CETAPHIL) lotion Apply 1 application topically as needed for dry skin. 10/14/15  Yes [name], MD  COUMADIN 3 MG tablet TAKE 1 DAILY EXCEPT 1/2 TABLET ON MONDAY AND FRIDAY 05/07/15  Yes Junie Spencer, FNP  docusate sodium (COLACE) 100 MG capsule Take 100 mg by mouth daily.    Yes Historical Provider, MD  fluocinonide-emollient (LIDEX-E) 0.05 % cream Apply 1 application topically 2 (two) times daily. 10/14/15  Yes [name], MD  latanoprost (XALATAN) 0.005 % ophthalmic solution INSTILL 1  DROP INTO RIGHT EYE AT BEDTIME - BEGIN NEW BOTTLE EVERY 42 DAYS 01/24/16  Yes Ernestina Penna, MD  levothyroxine (SYNTHROID, LEVOTHROID) 75 MCG tablet TAKE 1 TABLET EVERY DAY 12/16/15  Yes [name], MD  meclizine (ANTIVERT) 50 MG tablet Take 1 tablet (50 mg total) by mouth 3 (three) times daily as needed. 02/09/14  Yes Junious Silk, PA-C  metoprolol succinate (TOPROL-XL) 25 MG 24 hr tablet TAKE 1 TABLET (25 MG TOTAL) BY MOUTH DAILY. 10/26/15  Yes [name], MD  Multiple Vitamin (MULTIVITAMIN) tablet Take 1 tablet by mouth daily.     Yes Historical Provider, MD  pantoprazole (PROTONIX) 40 MG tablet TAKE 1 TABLET EVERY DAY 12/16/15  Yes [name], MD  simvastatin (ZOCOR) 40 MG tablet TAKE 1 TABLET AT BEDTIME 10/26/15  Yes [name], MD  tamsulosin (FLOMAX) 0.4 MG CAPS capsule Take 1 capsule (0.4 mg total) by mouth 2 (two) times daily. 07/06/14  Yes Deatra Canter, FNP  ZETIA 10 MG tablet TAKE 1 TABLET BY DAILY 01/11/16  Yes [name], MD    Allergies:  Allergies  Allergen Reactions  . Ivp Dye [Iodinated Diagnostic Agents] Swelling    Swelling   . Codeine Nausea Only    Nausea   . Sulfonamide Derivatives Hives and Itching  Hives itching     Social History   Social History  . Marital Status: Married    Spouse Name: N/A  . Number of Children: N/A  . Years of Education: N/A   Occupational History  . Retired    Social History Main Topics  . Smoking status: Never Smoker   . Smokeless tobacco: Not on file  . Alcohol Use: No  . Drug Use: No  . Sexual Activity: Not on file   Other Topics Concern  . Not on file   Social History Narrative     Review of Systems: Constitutional: negative for chills, fever, night sweats, weight changes, or fatigue  HEENT: negative for vision changes, hearing loss, congestion, rhinorrhea, ST, epistaxis, or sinus pressure Cardiovascular: negative for chest pain or palpitations Respiratory: negative for hemoptysis, wheezing,  shortness of breath, or cough Abdominal: negative for abdominal pain, nausea, vomiting, diarrhea, or constipation Dermatological: negative for rash Neurologic: negative for headache, dizziness, or syncope All other systems reviewed and are otherwise negative with the exception to those above and in the HPI.   Physical Exam: Blood pressure 123/66, pulse 79, temperature 97.1 F (36.2 C), temperature source Oral, height 5\' 8"  (1.727 m), weight 147 lb 6.4 oz (66.86 kg)., Body mass index is 22.42 kg/(m^2). General: Well developed, well nourished, in no acute distress. Head: Normocephalic, atraumatic, eyes without discharge, sclera non-icteric, nares are without discharge. Bilateral auditory canals clear, TM's are without perforation, pearly grey and translucent with reflective cone of light bilaterally. Oral cavity moist, posterior pharynx without exudate, erythema, peritonsillar abscess, or post nasal drip.  Neck: Supple. No thyromegaly. Full ROM. No lymphadenopathy. Lungs: Clear bilaterally to auscultation without wheezes, rales, or rhonchi. Breathing is unlabored. Heart: RRR with S1 S2. No murmurs, rubs, or gallops appreciated. Abdomen: Soft, non-tender, non-distended with normoactive bowel sounds. No hepatomegaly. No rebound/guarding. No obvious abdominal masses. There is large inguinal ring on the left but I can detect no impulse against finger when he coughs. Area in question in the groin is nontender. Msk:  Strength and tone normal for age. Extremities/Skin: Warm and dry. No clubbing or cyanosis. No edema. No rashes or suspicious lesions. Neuro: Alert and oriented X 3. Moves all extremities spontaneously. Gait is normal. CNII-XII grossly in tact. Psych:  Responds to questions appropriately with a normal affect.   Labs:   ASSESSMENT AND PLAN:  1. Pain in the groin, left I cannot definitely detect a hernia but location is consistent with that problem. This may be an atypical hernia like  femoral or other. Patient is willing to watch and wait. If symptoms worsen would certainly seek consultation with general surgery.  Frederica Kuster MD    01/27/2016 4:24 PM

## 2016-02-29 ENCOUNTER — Ambulatory Visit (INDEPENDENT_AMBULATORY_CARE_PROVIDER_SITE_OTHER): Payer: Commercial Managed Care - HMO | Admitting: Pharmacist Clinician (PhC)/ Clinical Pharmacy Specialist

## 2016-02-29 DIAGNOSIS — Z952 Presence of prosthetic heart valve: Secondary | ICD-10-CM

## 2016-02-29 DIAGNOSIS — Z954 Presence of other heart-valve replacement: Secondary | ICD-10-CM | POA: Diagnosis not present

## 2016-02-29 DIAGNOSIS — I482 Chronic atrial fibrillation, unspecified: Secondary | ICD-10-CM

## 2016-02-29 DIAGNOSIS — I480 Paroxysmal atrial fibrillation: Secondary | ICD-10-CM | POA: Diagnosis not present

## 2016-02-29 LAB — COAGUCHEK XS/INR WAIVED
INR: 3.6 — AB (ref 0.9–1.1)
INR: 3.1 — ABNORMAL HIGH (ref 0.9–1.1)
Prothrombin Time: 31.2 s
Prothrombin Time: 37.1 s

## 2016-02-29 NOTE — Patient Instructions (Signed)
Anticoagulation Dose Instructions as of 02/29/2016      [name] Tue Wed Thu Fri Sat   New Dose 3 mg 1.5 mg 3 mg 1.5 mg 3 mg 1.5 mg 3 mg    Description        * Goal 2.0-2.5 per cardiologist No coumadin today and tomorrow then on Thursday resume regular schedule.  Eat high vitamin K foods today and tomorrow.

## 2016-03-02 ENCOUNTER — Telehealth: Payer: Self-pay | Admitting: Family Medicine

## 2016-03-02 NOTE — Telephone Encounter (Signed)
Patient notified and reviewed dose from anticoagulation visit Tuesday, April 18th.  Patient has appt for review and follow up 03/10/16.

## 2016-03-10 ENCOUNTER — Ambulatory Visit (INDEPENDENT_AMBULATORY_CARE_PROVIDER_SITE_OTHER): Payer: Commercial Managed Care - HMO | Admitting: Pharmacist

## 2016-03-10 ENCOUNTER — Encounter: Payer: Self-pay | Admitting: Pharmacist

## 2016-03-10 ENCOUNTER — Ambulatory Visit (INDEPENDENT_AMBULATORY_CARE_PROVIDER_SITE_OTHER): Payer: Commercial Managed Care - HMO

## 2016-03-10 VITALS — BP 130/64 | HR 70 | Ht 68.0 in | Wt 147.0 lb

## 2016-03-10 DIAGNOSIS — M810 Age-related osteoporosis without current pathological fracture: Secondary | ICD-10-CM | POA: Insufficient documentation

## 2016-03-10 DIAGNOSIS — Z0000 Encounter for general adult medical examination without abnormal findings: Secondary | ICD-10-CM | POA: Diagnosis not present

## 2016-03-10 DIAGNOSIS — R29890 Loss of height: Secondary | ICD-10-CM | POA: Diagnosis not present

## 2016-03-10 DIAGNOSIS — I480 Paroxysmal atrial fibrillation: Secondary | ICD-10-CM

## 2016-03-10 DIAGNOSIS — Z952 Presence of prosthetic heart valve: Secondary | ICD-10-CM

## 2016-03-10 LAB — COAGUCHEK XS/INR WAIVED
INR: 1.8 — ABNORMAL HIGH (ref 0.9–1.1)
PROTHROMBIN TIME: 21 s

## 2016-03-10 MED ORDER — ALENDRONATE SODIUM 70 MG PO TABS: 70.0000 mg | ORAL_TABLET | ORAL | Status: DC

## 2016-03-10 NOTE — Patient Instructions (Addendum)
Anticoagulation Dose Instructions as of 03/10/2016      [name] Tue Wed Thu Fri Sat   New Dose 1.5 mg 3 mg 1.5 mg 3 mg 1.5 mg 3 mg 1.5 mg    Description        * Goal 2.0-2.5 per cardiologist Take 1 tablet instead of 1/2 today - Fridays, April 28th.   Then start 1 tablet mondays, wednesdays and fridays and 1/2 tablet all other days.     INR was 1.8 today    [name] , Thank you for taking time to come for your Medicare Wellness Visit. I appreciate your ongoing commitment to your health goals. Please review the following plan we discussed and let me know if I can assist you in the future.   These are the goals we discussed:  Start alendronate 70mg  - take 1 tablet on empty stomach with 8 ounce of water once WEEKLY.  Do no lie down or eat until 30 minutes after taking tablet.  Increase calcium in diet - calcium fortified orange juice, milk, white beans, salmon, almonds.   Continue to start active.   This is a list of the screening recommended for you and due dates:  Health Maintenance  Topic Date Due  . Tetanus Vaccine  07/14/1949 - cost is $60  . Shingles Vaccine  07/14/1990  . Pneumonia vaccines (2 of 2 - PPSV23) 08/11/2015  . Flu Shot  06/13/2016    Fall Prevention in the Home  Falls can cause injuries and can affect people from all age groups. There are many simple things that you can do to make your home safe and to help prevent falls. WHAT CAN I DO ON THE OUTSIDE OF MY HOME?  Regularly repair the edges of walkways and driveways and fix any cracks.  Remove high doorway thresholds.  Trim any shrubbery on the main path into your home.  Use bright outdoor lighting.  Clear walkways of debris and clutter, including tools and rocks.  Regularly check that handrails are securely fastened and in good repair. Both sides of any steps should have handrails.  Install guardrails along the edges of any raised decks or porches.  Have leaves, snow, and ice cleared  regularly.  Use sand or salt on walkways during winter months.  In the garage, clean up any spills right away, including grease or oil spills. WHAT CAN I DO IN THE BATHROOM?  Use night lights.  Install grab bars by the toilet and in the tub and shower. Do not use towel bars as grab bars.  Use non-skid mats or decals on the floor of the tub or shower.  If you need to sit down while you are in the shower, use a plastic, non-slip stool.Marland Kitchen  Keep the floor dry. Immediately clean up any water that spills on the floor.  Remove soap buildup in the tub or shower on a regular basis.  Attach bath mats securely with double-sided non-slip rug tape.  Remove throw rugs and other tripping hazards from the floor. WHAT CAN I DO IN THE BEDROOM?  Use night lights.  Make sure that a bedside light is easy to reach.  Do not use oversized bedding that drapes onto the floor.  Have a firm chair that has side arms to use for getting dressed.  Remove throw rugs and other tripping hazards from the floor. WHAT CAN I DO IN THE KITCHEN?   Clean up any spills right away.  Avoid walking on wet floors.  Place frequently used items in easy-to-reach places.  If you need to reach for something above you, use a sturdy step stool that has a grab bar.  Keep electrical cables out of the way.  Do not use floor polish or wax that makes floors slippery. If you have to use wax, make sure that it is non-skid floor wax.  Remove throw rugs and other tripping hazards from the floor. WHAT CAN I DO IN THE STAIRWAYS?  Do not leave any items on the stairs.  Make sure that there are handrails on both sides of the stairs. Fix handrails that are broken or loose. Make sure that handrails are as long as the stairways.  Check any carpeting to make sure that it is firmly attached to the stairs. Fix any carpet that is loose or worn.  Avoid having throw rugs at the top or bottom of stairways, or secure the rugs with carpet  tape to prevent them from moving.  Make sure that you have a light switch at the top of the stairs and the bottom of the stairs. If you do not have them, have them installed. WHAT ARE SOME OTHER FALL PREVENTION TIPS?  Wear closed-toe shoes that fit well and support your feet. Wear shoes that have rubber soles or low heels.  When you use a stepladder, make sure that it is completely opened and that the sides are firmly locked. Have someone hold the ladder while you are using it. Do not climb a closed stepladder.  Add color or contrast paint or tape to grab bars and handrails in your home. Place contrasting color strips on the first and last steps.  Use mobility aids as needed, such as canes, walkers, scooters, and crutches.  Turn on lights if it is dark. Replace any light bulbs that burn out.  Set up furniture so that there are clear paths. Keep the furniture in the same spot.  Fix any uneven floor surfaces.  Choose a carpet design that does not hide the edge of steps of a stairway.  Be aware of any and all pets.  Review your medicines with your healthcare provider. Some medicines can cause dizziness or changes in blood pressure, which increase your risk of falling. Talk with your health care provider about other ways that you can decrease your risk of falls. This may include working with a physical therapist or trainer to improve your strength, balance, and endurance.   This information is not intended to replace advice given to you by your health care provider. Make sure you discuss any questions you have with your health care provider.   Document Released: 10/20/2002 Document Revised: 03/16/2015 Document Reviewed: 12/04/2014 Elsevier Interactive Patient Education [employer]

## 2016-03-10 NOTE — Progress Notes (Addendum)
Patient ID: [name], male   DOB: May 16, 1928, 82 y.o.   MRN: [id number]    Subjective:   [name] is a 82 y.o. male who presents for a subsequent Medicare Annual Wellness Visit and to recheck INR.  [name] has INR checked about 10 days ago and was found to be supratherapeutic at 3.6 (there was also a second INR on 02/29/16 that was 3.1). Patient is taking warfarin due to MVR / St Jude's but due to history of subdural hematoma related to supratherapeutic anticoagulation his INR goal is 2.0 to 2.5.  Current warfarin dose is  sundays, tuesdays and Saturdays.  1.5mg  mondays, wednesdays, thursdays and fridays.   Denies s/s of bleeding.   Patient is present with his wife today who is also having AWV.    Review of Systems  Review of Systems  Constitutional: Negative.   HENT: Negative.   Eyes: Negative.   Respiratory: Negative.   Cardiovascular: Negative.   Gastrointestinal: Negative.   Genitourinary: Negative.   Musculoskeletal: Positive for joint pain.  Skin: Negative.   Neurological: Negative.   Endo/Heme/Allergies: Negative.   Psychiatric/Behavioral: Negative.        Current Medications (verified) Outpatient Encounter Prescriptions as of 03/10/2016  Medication Sig  . aspirin EC 81 MG tablet Take 1 tablet (81 mg total) by mouth daily.  . cetaphil (CETAPHIL) lotion Apply 1 application topically as needed for dry skin.  Marland Kitchen COUMADIN 3 MG tablet TAKE 1 DAILY EXCEPT 1/2 TABLET ON MONDAY AND FRIDAY  . docusate sodium (COLACE) 100 MG capsule Take 100 mg by mouth daily.   . fluocinonide-emollient (LIDEX-E) 0.05 % cream Apply 1 application topically 2 (two) times daily.  Marland Kitchen latanoprost (XALATAN) 0.005 % ophthalmic solution INSTILL 1 DROP INTO RIGHT EYE AT BEDTIME - BEGIN NEW BOTTLE EVERY 42 DAYS  . levothyroxine (SYNTHROID, LEVOTHROID) 75 MCG tablet TAKE 1 TABLET EVERY DAY  . metoprolol succinate (TOPROL-XL) 25 MG 24 hr tablet TAKE 1 TABLET (25 MG TOTAL) BY MOUTH  DAILY.  . Multiple Vitamin (MULTIVITAMIN) tablet Take 1 tablet by mouth daily.    . pantoprazole (PROTONIX) 40 MG tablet TAKE 1 TABLET EVERY DAY  . simvastatin (ZOCOR) 40 MG tablet TAKE 1 TABLET AT BEDTIME  . tamsulosin (FLOMAX) 0.4 MG CAPS capsule Take 1 capsule (0.4 mg total) by mouth 2 (two) times daily.  Marland Kitchen ZETIA 10 MG tablet TAKE 1 TABLET BY DAILY  . alendronate (FOSAMAX) 70 MG tablet Take 1 tablet (70 mg total) by mouth once a week. Take with a full glass of water on an empty stomach.  . meclizine (ANTIVERT) 50 MG tablet Take 1 tablet (50 mg total) by mouth 3 (three) times daily as needed. (Patient not taking: Reported on 03/10/2016)   No facility-administered encounter medications on file as of 03/10/2016.    Allergies (verified) Ivp dye; Codeine; and Sulfonamide derivatives   History: Past Medical History  Diagnosis Date  . Aortic stenosis     St.Jude AVR in 1985; Echo 8/12: EF 50-55%, mild LVH, mech AV with mild AI, mean gradient 14 mmHg, mild to mod MR;  Echo 8/13: EF 60-65%, AVR ok with mean gradient 19 mmHg  . CAD (coronary artery disease)     Mild, nonobstructive at catheterization 5/01  . Personal history of subdural hematoma     Requiring craniotomy  . History of pulmonary embolism     post op DVT/PE;  s/p IVC filter  . Hyperlipidemia   . Hypothyroidism   .  S/P cholecystectomy 2005  . CVA (cerebral infarction) 12/2010  . S/P shoulder surgery     left  . Carotid stenosis     Dopplers 8/13: RICA 40-50% and LICA 0-39% => followup 1 year  . DVT (deep venous thrombosis) (HCC)   . Syncope     + carotid sinus massage  . Pacemaker  Biotronik   . Cataract   . Stroke (HCC) 2012  . History of shingles    Past Surgical History  Procedure Laterality Date  . Aortic valve replacement  1985    St. Jude / mechanical  . Cardiac catheterization    . Vascular surgery    . Brain surgery    . Permanent pacemaker insertion N/A 08/01/2012    Procedure: PERMANENT PACEMAKER  INSERTION;  Surgeon: Duke Salvia, MD;  Location: [facility] CATH LAB;  Service: Cardiovascular;  Laterality: N/A;  . Eye surgery      bilateral cataract   Family History  Problem Relation Age of Onset  . Coronary artery disease Other   . Stroke Mother   . Cancer Father     Lip  . Heart disease Father     Three MI  . Heart attack Father   . Heart attack Brother   . Heart disease Brother   . Heart attack Brother   . Arthritis Sister    Social History   Occupational History  . Retired    Social History Main Topics  . Smoking status: Never Smoker   . Smokeless tobacco: Never Used  . Alcohol Use: No  . Drug Use: No  . Sexual Activity: No    Do you feel safe at home?  Yes Are there smokers in your home (other than you)? No  Dietary issues and exercise activities discussed: Current Exercise Habits: The patient does not participate in regular exercise at present (he gardens regularly), Exercise limited by: cardiac condition(s)  Current Dietary habits:  Usually eats a large breakfast - bacon, eggs, grits, fruit.  Rest of days eats small meals.    Cardiac Risk Factors include: male gender;advanced age (>58men, >83 women);dyslipidemia  Objective:    Today's Vitals   03/10/16 1034  BP: 130/64  Pulse: 70  Height: 5\' 8"  (1.727 m)  Weight: 147 lb (66.679 kg)  PainSc: 2   PainLoc: Knee   Body mass index is 22.36 kg/(m^2).   INR = 1.8 today  DEXA results:  T- Score for L-Spine = -2.5 T-Score for neck of left hip = -2.8 T-Score for neck of right hip = -2.6   Activities of Daily Living In your present state of health, do you have any difficulty performing the following activities: 03/10/2016 03/10/2016  Hearing? - N  Vision? - N  Difficulty concentrating or making decisions? - N  Walking or climbing stairs? - N  Dressing or bathing? - N  Doing errands, shopping? - N  Quarry manager and eating ? - N  Using the Toilet? Y Y  In the past six months, have you accidently  leaked urine? - N  Do you have problems with loss of bowel control? - N  Managing your Medications? N -  Managing your Finances? N -  Housekeeping or managing your Housekeeping? N -     Depression Screen PHQ 2/9 Scores 03/10/2016 10/14/2015 02/17/2015 11/09/2014  PHQ - 2 Score 1 0 1 0  PHQ- 9 Score - - - -     Fall Risk Fall Risk  03/10/2016 10/14/2015 02/17/2015 11/09/2014  09/21/2014  Falls in the past year? No No Yes No No  Number falls in past yr: - - 1 - -  Injury with Fall? - - No - -  Follow up - - Falls prevention discussed;Falls evaluation completed - -    Cognitive Function: MMSE - Mini Mental State Exam 03/10/2016 03/10/2016 02/17/2015  Orientation to time 5 5 5   Orientation to Place 5 5 5   Registration 3 3 3   Attention/ Calculation 3 3 4   Recall 1 1 0  Language- name 2 objects 2 2 2   Language- repeat 1 1 1   Language- follow 3 step command 3 3 3   Language- read & follow direction 1 - 1  Write a sentence 1 - 1  Copy design 0 - 0  Total score 25 - 25    Immunizations and Health Maintenance Immunization History  Administered Date(s) Administered  . Influenza,inj,Quad PF,36+ Mos 08/25/2013, 08/10/2014, 08/27/2015  . Pneumococcal Conjugate-13 08/10/2014   Health Maintenance Due  Topic Date Due  . TETANUS/TDAP  07/14/1949  . PNA vac Low Risk Adult (2 of 2 - PPSV23) 08/11/2015    Patient Care Team: [name], MD as PCP - General (Family Medicine) Ernesto Rutherford, MD as Consulting Physician (Ophthalmology) Duke Salvia, MD as Consulting Physician (Cardiology)  Indicate any recent Medical Services you may have received from other than [facility] providers in the past year (date may be approximate).    Assessment:    Annual Wellness Visit  Osteoporosis   Screening Tests Health Maintenance  Topic Date Due  . TETANUS/TDAP  07/14/1949  . PNA vac Low Risk Adult (2 of 2 - PPSV23) 08/11/2015  . ZOSTAVAX  11/13/2016 (Originally 07/14/1990)  . INFLUENZA VACCINE  06/13/2016          Plan:   During the course of the visit Alphonza was educated and counseled about the following appropriate screening and preventive services:   Vaccines to include Pneumoccal, Influenza,  Td, Zostavax - patient has history of Shingles affecting nerve near eye. Patient also declines Zostavax due to cost. Patient declines Tdap due to cost. Patient has not received Pneumo 23 per EMR however he believes he has had in past. Will review past paper records and recommend if unable to confirm receipt.  Colorectal cancer screening - FOBT due - home test given today.  Colonoscopy is UTD  Cardiovascular disease screening - UTD  Diabetes screening - UTD  Bone Denisty / Osteoporosis Screening - DEXA done today and showed ostoeporosis.  Start alendroante 70mg  1 tablet q week.  Also discussed increasing calcium in diet and from supplementations to get 1200mg  daily.   Glaucoma screening / Eye Exam - UTD  Nutrition counseling - as mentioned above increase calcium in diet. Continue with low salt diet  Prostate cancer screening - UTD  Advanced Directives - UTD  Physical Activity - continue to stay active walking and gardening.  Patient Instructions (the written plan) were given to the patient.   Henrene Pastor, [facility]   03/10/2016        I have reviewed and agree with the above AWV documentation.  [name], M.D.

## 2016-03-11 NOTE — Addendum Note (Signed)
Addended by: [name] on: 03/11/2016 08:11 AM   Modules accepted: Kipp Brood

## 2016-03-16 ENCOUNTER — Other Ambulatory Visit: Payer: Self-pay | Admitting: Family Medicine

## 2016-03-21 ENCOUNTER — Ambulatory Visit (INDEPENDENT_AMBULATORY_CARE_PROVIDER_SITE_OTHER): Payer: Commercial Managed Care - HMO | Admitting: Pharmacist Clinician (PhC)/ Clinical Pharmacy Specialist

## 2016-03-21 DIAGNOSIS — Z952 Presence of prosthetic heart valve: Secondary | ICD-10-CM

## 2016-03-21 DIAGNOSIS — Z954 Presence of other heart-valve replacement: Secondary | ICD-10-CM | POA: Diagnosis not present

## 2016-03-21 DIAGNOSIS — I480 Paroxysmal atrial fibrillation: Secondary | ICD-10-CM | POA: Diagnosis not present

## 2016-03-21 DIAGNOSIS — I482 Chronic atrial fibrillation, unspecified: Secondary | ICD-10-CM

## 2016-03-21 LAB — COAGUCHEK XS/INR WAIVED
INR: 2.1 — ABNORMAL HIGH (ref 0.9–1.1)
Prothrombin Time: 25.5 s

## 2016-03-21 NOTE — Patient Instructions (Signed)
Anticoagulation Dose Instructions as of 03/21/2016      [name] Tue Wed Thu Fri Sat   New Dose 1.5 mg 3 mg 1.5 mg 3 mg 1.5 mg 3 mg 1.5 mg    Description        * Goal 2.0-2.5 per cardiologist  Continue taking your warfarin as listed above  INR today is 2.1

## 2016-03-22 NOTE — Progress Notes (Signed)
Patient ID: [name], male   DOB: [birth year], 82 y.o.   MRN: [id number] 03/22/2016 Reviewed patient's paper medical chart and was unable to find documentation of pneumovax 23 administration.  Will discuss at next appt.

## 2016-03-29 ENCOUNTER — Other Ambulatory Visit: Payer: Commercial Managed Care - HMO

## 2016-03-29 DIAGNOSIS — Z1211 Encounter for screening for malignant neoplasm of colon: Secondary | ICD-10-CM | POA: Diagnosis not present

## 2016-03-31 LAB — FECAL OCCULT BLOOD, IMMUNOCHEMICAL: Fecal Occult Bld: NEGATIVE

## 2016-04-12 ENCOUNTER — Encounter: Payer: Self-pay | Admitting: Physician Assistant

## 2016-04-12 ENCOUNTER — Ambulatory Visit (INDEPENDENT_AMBULATORY_CARE_PROVIDER_SITE_OTHER): Payer: Commercial Managed Care - HMO | Admitting: Physician Assistant

## 2016-04-12 VITALS — BP 126/74 | HR 70 | Ht 68.0 in | Wt 144.0 lb

## 2016-04-12 DIAGNOSIS — Z950 Presence of cardiac pacemaker: Secondary | ICD-10-CM

## 2016-04-12 DIAGNOSIS — I480 Paroxysmal atrial fibrillation: Secondary | ICD-10-CM | POA: Diagnosis not present

## 2016-04-12 DIAGNOSIS — Z954 Presence of other heart-valve replacement: Secondary | ICD-10-CM

## 2016-04-12 DIAGNOSIS — I10 Essential (primary) hypertension: Secondary | ICD-10-CM | POA: Diagnosis not present

## 2016-04-12 DIAGNOSIS — Z952 Presence of prosthetic heart valve: Secondary | ICD-10-CM

## 2016-04-12 NOTE — Patient Instructions (Signed)
Medication Instructions:   Your physician recommends that you continue on your current medications as directed. Please refer to the Current Medication list given to you today.   If you need a refill on your cardiac medications before your next appointment, please call your pharmacy.  Labwork:  NONE ORDER TODAY    Testing/Procedures: IN 6 MONTHS WITH DEVICE CHECK  Your physician has requested that you have an echocardiogram. Echocardiography is a painless test that uses sound waves to create images of your heart. It provides your doctor with information about the size and shape of your heart and how well your heart's chambers and valves are working. This procedure takes approximately one hour. There are no restrictions for this procedure.    Follow-Up:   6 MONTHS WITH THE DEVICE CLINIC   Your physician wants you to follow-up in: ONE YEAR WITH Graciela Husbands  You will receive a reminder letter in the mail two months in advance. If you don't receive a letter, please call our office to schedule the follow-up appointment.     Any Other Special Instructions Will Be Listed Below (If Applicable).

## 2016-04-12 NOTE — Progress Notes (Signed)
Cardiology Office Note Date:  04/12/2016  Patient ID:  [name], DOB 11-27-1927, MRN [id number] PCP:  [name], MD  Electrophysiologist: Dr. Graciela Husbands  Chief Complaint: routine PPM visit  History of Present Illness: [name] is a 82 y.o. male with history of  Mechanical AVR, SDH requiring intervention in 2004, in 2012 has a stroke with ASA added to his warfarin regime, carotid sinus hypersensitivity with PPM, post op DVT/PE with IVCF, HLD, hypothyroidism.  He comes to the office today to be seen for Dr. Graciela Husbands, last seeing him in Oct 2015 his note mentioning AFib and some orthostatic symptoms.  He comes today feeling very well, he is accompanied by his wife and son.  He reports sleeping well, mows his grass with apush mower, does gardening and described as quite active with good exertional capacity.  No CP, palpitations or SOB, no near syncope or syncope.    He reports his INR done withhis PMD and has been stable for years, no bleeding or signs of bleeding reported  Past Medical History  Diagnosis Date  . Aortic stenosis     St.Jude AVR in 1985; Echo 8/12: EF 50-55%, mild LVH, mech AV with mild AI, mean gradient 14 mmHg, mild to mod MR;  Echo 8/13: EF 60-65%, AVR ok with mean gradient 19 mmHg  . CAD (coronary artery disease)     Mild, nonobstructive at catheterization 5/01  . Personal history of subdural hematoma     Requiring craniotomy  . History of pulmonary embolism     post op DVT/PE;  s/p IVC filter  . Hyperlipidemia   . Hypothyroidism   . S/P cholecystectomy 2005  . CVA (cerebral infarction) 12/2010  . S/P shoulder surgery     left  . Carotid stenosis     Dopplers 8/13: RICA 40-50% and LICA 0-39% => followup 1 year  . DVT (deep venous thrombosis) (HCC)   . Syncope     + carotid sinus massage  . Pacemaker  Biotronik   . Cataract   . Stroke (HCC) 2012  . History of shingles   . History of rib fracture     Past Surgical History  Procedure Laterality  Date  . Aortic valve replacement  1985    St. Jude / mechanical  . Cardiac catheterization    . Vascular surgery    . Brain surgery    . Permanent pacemaker insertion N/A 08/01/2012    Procedure: PERMANENT PACEMAKER INSERTION;  Surgeon: Duke Salvia, MD;  Location: [facility] CATH LAB;  Service: Cardiovascular;  Laterality: N/A;  . Eye surgery      bilateral cataract    Current Outpatient Prescriptions  Medication Sig Dispense Refill  . alendronate (FOSAMAX) 70 MG tablet Take 1 tablet (70 mg total) by mouth once a week. Take with a full glass of water on an empty stomach. 4 tablet 2  . aspirin EC 81 MG tablet Take 1 tablet (81 mg total) by mouth daily.    . cetaphil (CETAPHIL) lotion Apply 1 application topically as needed for dry skin. 236 mL 0  . COUMADIN 3 MG tablet TAKE 1 DAILY EXCEPT 1/2 TABLET ON MONDAY AND FRIDAY 90 tablet 3  . docusate sodium (COLACE) 100 MG capsule Take 100 mg by mouth daily.     . fluocinonide-emollient (LIDEX-E) 0.05 % cream Apply 1 application topically 2 (two) times daily. 60 g 5  . latanoprost (XALATAN) 0.005 % ophthalmic solution INSTILL 1 DROP INTO RIGHT EYE AT  BEDTIME - BEGIN NEW BOTTLE EVERY 42 DAYS 7.5 mL 2  . levothyroxine (SYNTHROID, LEVOTHROID) 75 MCG tablet TAKE 1 TABLET EVERY DAY 90 tablet 3  . meclizine (ANTIVERT) 50 MG tablet Take 1 tablet (50 mg total) by mouth 3 (three) times daily as needed. 30 tablet 0  . metoprolol succinate (TOPROL-XL) 25 MG 24 hr tablet TAKE 1 TABLET EVERY DAY 90 tablet 0  . Multiple Vitamin (MULTIVITAMIN) tablet Take 1 tablet by mouth daily.      . pantoprazole (PROTONIX) 40 MG tablet TAKE 1 TABLET EVERY DAY 90 tablet 1  . simvastatin (ZOCOR) 40 MG tablet TAKE 1 TABLET AT BEDTIME 90 tablet 0  . tamsulosin (FLOMAX) 0.4 MG CAPS capsule Take 1 capsule (0.4 mg total) by mouth 2 (two) times daily. 180 capsule 3  . ZETIA 10 MG tablet TAKE 1 TABLET BY DAILY 30 tablet 3   No current facility-administered medications for this visit.     Allergies:   Ivp dye; Codeine; and Sulfonamide derivatives   Social History:  The patient  reports that he has never smoked. He has never used smokeless tobacco. He reports that he does not drink alcohol or use illicit drugs.   Family History:  The patient's family history includes Arthritis in his sister; Cancer in his father; Coronary artery disease in his other; Heart attack in his brother, brother, and father; Heart disease in his brother and father; Stroke in his mother.  ROS:  Please see the history of present illness.    All other systems are reviewed and otherwise negative.   PHYSICAL EXAM:  VS:  BP 126/74 mmHg  Pulse 70  Ht 5\' 8"  (1.727 m)  Wt 144 lb (65.318 kg)  BMI 21.90 kg/m2 BMI: Body mass index is 21.9 kg/(m^2). Well nourished, well developed, in no acute distress HEENT: normocephalic, atraumatic Neck: no JVD, carotid bruits or masses Cardiac:  normal S1, S2; RRR; no significant murmurs, no rubs, or gallops Lungs:  clear to auscultation bilaterally, no wheezing, rhonchi or rales Abd: soft, nontender MS: no deformity, age appropriate atrophy Ext: no edema Skin: warm and dry, no rash Neuro:  No gross deficits appreciated Psych: euthymic mood, full affect  PPM site is stable, no tethering or discomfort   EKG:  Done today and reviewed by myself is A paced, V sensed PPM check today with normal function, very short AMS/PAFlutter or ATach, battery status is good  06/28/12: Echocardiogram Study Conclusions - Left ventricle: The cavity size was normal. Wall thickness was normal. Systolic function was normal. The estimated ejection fraction was in the range of 60% to 65%. - Aortic valve: AV prosthesis appears to open well. PEak and mean gradients through the valve are 34 and 19 mm Hg respectively. There is trace AI.  Recent Labs: 10/19/2015: ALT 11; BUN 13; Creatinine, Ser 1.11; Platelets 155; Potassium 4.8; Sodium 142; TSH 0.507  10/19/2015: Cholesterol  137; HDL Cholesterol by NMR 61; Triglycerides by NMR 63   CrCl cannot be calculated (Patient has no serum creatinine result on file.).   Wt Readings from Last 3 Encounters:  04/12/16 144 lb (65.318 kg)  03/10/16 147 lb (66.679 kg)  01/27/16 147 lb 6.4 oz (66.86 kg)     Other studies reviewed: Additional studies/records reviewed today include: summarized above  DEVICE information:  Biotronik dual chamber PPM, implanted 08/01/12, Dr. Ladona Ridgel  ASSESSMENT AND PLAN:  1. Carotid hypersensitivity, PPM     device functioning well  2. Mechanical AVR  On warfarin, monitored and managed with his PMD office    Echo 2013 valve functioning well  3. HTN     Controlled, follow  4. AFib/flutter     CHA2DS2Vasc is 6 on warfarin     Very brief up to 10 seconds of AMS on his device for a ttotal of 4 minutes  Disposition: Will check an echo given AVR, though since no symptoms and at the patient's request will schedule it for when comes in for his next device check since it has been a few years, pacer check in 6months, 1 year in-clinic visit with Dr. Graciela Husbands  Current medicines are reviewed at length with the patient today.  The patient did not have any concerns regarding medicines.  Judith Blonder, PA-C 04/12/2016 12:23 PM     [facility] [address] [place] [address] [phone] (office)  [phone] (fax)

## 2016-04-28 ENCOUNTER — Ambulatory Visit (INDEPENDENT_AMBULATORY_CARE_PROVIDER_SITE_OTHER): Payer: Commercial Managed Care - HMO | Admitting: Pharmacist Clinician (PhC)/ Clinical Pharmacy Specialist

## 2016-04-28 DIAGNOSIS — I482 Chronic atrial fibrillation, unspecified: Secondary | ICD-10-CM

## 2016-04-28 DIAGNOSIS — I480 Paroxysmal atrial fibrillation: Secondary | ICD-10-CM | POA: Diagnosis not present

## 2016-04-28 DIAGNOSIS — Z954 Presence of other heart-valve replacement: Secondary | ICD-10-CM | POA: Diagnosis not present

## 2016-04-28 DIAGNOSIS — Z952 Presence of prosthetic heart valve: Secondary | ICD-10-CM

## 2016-04-28 LAB — COAGUCHEK XS/INR WAIVED
INR: 1.9 — AB (ref 0.9–1.1)
PROTHROMBIN TIME: 23.1 s

## 2016-04-28 NOTE — Patient Instructions (Signed)
Anticoagulation Dose Instructions as of 04/28/2016      [name] Tue Wed Thu Fri Sat   New Dose 1.5 mg 3 mg 1.5 mg 3 mg 1.5 mg 3 mg 1.5 mg    Description         * Goal 2.0-2.5 per cardiologist  Take a whole tablet today then continue taking your warfarin as listed above  INR today is 2.1

## 2016-05-01 ENCOUNTER — Other Ambulatory Visit: Payer: Self-pay | Admitting: Family Medicine

## 2016-05-15 ENCOUNTER — Other Ambulatory Visit: Payer: Self-pay | Admitting: Family Medicine

## 2016-05-17 ENCOUNTER — Other Ambulatory Visit: Payer: Self-pay | Admitting: Family Medicine

## 2016-05-19 ENCOUNTER — Other Ambulatory Visit: Payer: Self-pay | Admitting: Family

## 2016-06-09 ENCOUNTER — Ambulatory Visit (INDEPENDENT_AMBULATORY_CARE_PROVIDER_SITE_OTHER): Payer: Commercial Managed Care - HMO | Admitting: Pharmacist Clinician (PhC)/ Clinical Pharmacy Specialist

## 2016-06-09 DIAGNOSIS — I480 Paroxysmal atrial fibrillation: Secondary | ICD-10-CM | POA: Diagnosis not present

## 2016-06-09 DIAGNOSIS — Z952 Presence of prosthetic heart valve: Secondary | ICD-10-CM

## 2016-06-09 DIAGNOSIS — I482 Chronic atrial fibrillation, unspecified: Secondary | ICD-10-CM

## 2016-06-09 DIAGNOSIS — Z954 Presence of other heart-valve replacement: Secondary | ICD-10-CM | POA: Diagnosis not present

## 2016-06-09 LAB — COAGUCHEK XS/INR WAIVED
INR: 1.7 — ABNORMAL HIGH (ref 0.9–1.1)
PROTHROMBIN TIME: 20.1 s

## 2016-06-09 NOTE — Patient Instructions (Signed)
Anticoagulation Dose Instructions as of 06/09/2016      [name] Tue Wed Thu Fri Sat   New Dose 1.5 mg 3 mg 1.5 mg 3 mg 1.5 mg 3 mg 3 mg    Description   Change to taking 1 tablet Mondays, Wednesdays, Fridays and now Saturdays- take 1/2 tablet all of there days of the week.  INR today 1.7   * Goal 2.0-2.5 per cardiologist

## 2016-06-21 ENCOUNTER — Telehealth: Payer: Self-pay | Admitting: Family Medicine

## 2016-06-21 DIAGNOSIS — N400 Enlarged prostate without lower urinary tract symptoms: Secondary | ICD-10-CM

## 2016-06-21 MED ORDER — TAMSULOSIN HCL 0.4 MG PO CAPS: 0.4000 mg | ORAL_CAPSULE | Freq: Two times a day (BID) | ORAL | 0 refills | Status: DC

## 2016-06-21 NOTE — Telephone Encounter (Signed)
done

## 2016-06-30 ENCOUNTER — Ambulatory Visit (INDEPENDENT_AMBULATORY_CARE_PROVIDER_SITE_OTHER): Payer: Commercial Managed Care - HMO | Admitting: Pharmacist Clinician (PhC)/ Clinical Pharmacy Specialist

## 2016-06-30 DIAGNOSIS — Z954 Presence of other heart-valve replacement: Secondary | ICD-10-CM

## 2016-06-30 DIAGNOSIS — I482 Chronic atrial fibrillation, unspecified: Secondary | ICD-10-CM

## 2016-06-30 DIAGNOSIS — I480 Paroxysmal atrial fibrillation: Secondary | ICD-10-CM

## 2016-06-30 DIAGNOSIS — Z952 Presence of prosthetic heart valve: Secondary | ICD-10-CM

## 2016-06-30 LAB — COAGUCHEK XS/INR WAIVED
INR: 1.6 — AB (ref 0.9–1.1)
PROTHROMBIN TIME: 19.7 s

## 2016-06-30 NOTE — Patient Instructions (Signed)
Anticoagulation Dose Instructions as of 06/30/2016      [name] Tue Wed Thu Fri Sat   New Dose 3 mg 3 mg 3 mg 1.5 mg 3 mg 3 mg 3 mg    Description   Increase to taking 1 tablet a day except for Wednesdays take 1/2 tablet  INR today is 1.6  * Goal 2.0-2.5 per cardiologist

## 2016-07-14 ENCOUNTER — Ambulatory Visit (INDEPENDENT_AMBULATORY_CARE_PROVIDER_SITE_OTHER): Payer: Commercial Managed Care - HMO | Admitting: Pharmacist Clinician (PhC)/ Clinical Pharmacy Specialist

## 2016-07-14 DIAGNOSIS — I480 Paroxysmal atrial fibrillation: Secondary | ICD-10-CM

## 2016-07-14 DIAGNOSIS — Z952 Presence of prosthetic heart valve: Secondary | ICD-10-CM

## 2016-07-14 DIAGNOSIS — Z954 Presence of other heart-valve replacement: Secondary | ICD-10-CM

## 2016-07-14 DIAGNOSIS — I482 Chronic atrial fibrillation, unspecified: Secondary | ICD-10-CM

## 2016-07-14 LAB — COAGUCHEK XS/INR WAIVED
INR: 2.6 — AB (ref 0.9–1.1)
Prothrombin Time: 31.2 s

## 2016-07-14 MED ORDER — EZETIMIBE 10 MG PO TABS: 10.0000 mg | ORAL_TABLET | Freq: Every day | ORAL | 3 refills | Status: DC

## 2016-07-14 NOTE — Patient Instructions (Signed)
Anticoagulation Dose Instructions as of 07/14/2016      [name] Tue Wed Thu Fri Sat   New Dose 3 mg 3 mg 3 mg 1.5 mg 3 mg 3 mg 3 mg    Description   Continue taking warfarin the same way.  INR today is 2.6   * Goal 2.0-2.5 per cardiologist

## 2016-07-19 ENCOUNTER — Other Ambulatory Visit: Payer: Self-pay | Admitting: Family Medicine

## 2016-07-31 ENCOUNTER — Ambulatory Visit (INDEPENDENT_AMBULATORY_CARE_PROVIDER_SITE_OTHER): Payer: Commercial Managed Care - HMO | Admitting: *Deleted

## 2016-07-31 DIAGNOSIS — Z23 Encounter for immunization: Secondary | ICD-10-CM

## 2016-07-31 NOTE — Progress Notes (Signed)
B12 injection given IM right deltoid and tolerated well. 

## 2016-08-01 DIAGNOSIS — Z23 Encounter for immunization: Secondary | ICD-10-CM

## 2016-08-18 ENCOUNTER — Ambulatory Visit (INDEPENDENT_AMBULATORY_CARE_PROVIDER_SITE_OTHER): Payer: Commercial Managed Care - HMO | Admitting: Pharmacist

## 2016-08-18 DIAGNOSIS — I482 Chronic atrial fibrillation, unspecified: Secondary | ICD-10-CM

## 2016-08-18 DIAGNOSIS — I480 Paroxysmal atrial fibrillation: Secondary | ICD-10-CM

## 2016-08-18 DIAGNOSIS — Z952 Presence of prosthetic heart valve: Secondary | ICD-10-CM | POA: Diagnosis not present

## 2016-08-18 LAB — COAGUCHEK XS/INR WAIVED
INR: 2.2 — AB (ref 0.9–1.1)
PROTHROMBIN TIME: 26 s

## 2016-08-18 NOTE — Patient Instructions (Signed)
Anticoagulation Dose Instructions as of 08/18/2016      [name] Tue Wed Thu Fri Sat   New Dose 3 mg 1.5 mg 3 mg 1.5 mg 3 mg 1.5 mg 3 mg    Description   I changed dose to reflect how you have been taking medication at home. Take 1/2 tablet on Monday, Wednesday, and Friday.  INR today is 2.2   * Goal 2.0-2.5 per cardiologist

## 2016-08-22 DIAGNOSIS — H40013 Open angle with borderline findings, low risk, bilateral: Secondary | ICD-10-CM | POA: Diagnosis not present

## 2016-08-22 DIAGNOSIS — H2589 Other age-related cataract: Secondary | ICD-10-CM | POA: Diagnosis not present

## 2016-08-22 DIAGNOSIS — Z961 Presence of intraocular lens: Secondary | ICD-10-CM | POA: Diagnosis not present

## 2016-09-15 ENCOUNTER — Ambulatory Visit (INDEPENDENT_AMBULATORY_CARE_PROVIDER_SITE_OTHER): Payer: Commercial Managed Care - HMO | Admitting: Pharmacist Clinician (PhC)/ Clinical Pharmacy Specialist

## 2016-09-15 DIAGNOSIS — I482 Chronic atrial fibrillation, unspecified: Secondary | ICD-10-CM

## 2016-09-15 DIAGNOSIS — I480 Paroxysmal atrial fibrillation: Secondary | ICD-10-CM | POA: Diagnosis not present

## 2016-09-15 DIAGNOSIS — Z952 Presence of prosthetic heart valve: Secondary | ICD-10-CM | POA: Diagnosis not present

## 2016-09-15 LAB — COAGUCHEK XS/INR WAIVED
INR: 2.1 — AB (ref 0.9–1.1)
Prothrombin Time: 24.8 s

## 2016-09-15 NOTE — Patient Instructions (Addendum)
Anticoagulation Dose Instructions as of 09/15/2016      [name] Tue Wed Thu Fri Sat   New Dose 3 mg 1.5 mg 3 mg 1.5 mg 3 mg 1.5 mg 3 mg    Description   Continue taking warfarin the same way  INR today is 2.1   * Goal 2.0-2.5 per cardiologist

## 2016-10-12 ENCOUNTER — Ambulatory Visit (INDEPENDENT_AMBULATORY_CARE_PROVIDER_SITE_OTHER): Payer: Commercial Managed Care - HMO | Admitting: *Deleted

## 2016-10-12 ENCOUNTER — Other Ambulatory Visit: Payer: Self-pay

## 2016-10-12 ENCOUNTER — Ambulatory Visit (HOSPITAL_COMMUNITY): Payer: Commercial Managed Care - HMO | Attending: Internal Medicine

## 2016-10-12 DIAGNOSIS — Z952 Presence of prosthetic heart valve: Secondary | ICD-10-CM

## 2016-10-12 DIAGNOSIS — Z950 Presence of cardiac pacemaker: Secondary | ICD-10-CM

## 2016-10-12 DIAGNOSIS — I340 Nonrheumatic mitral (valve) insufficiency: Secondary | ICD-10-CM | POA: Insufficient documentation

## 2016-10-12 DIAGNOSIS — Z953 Presence of xenogenic heart valve: Secondary | ICD-10-CM | POA: Insufficient documentation

## 2016-10-12 DIAGNOSIS — I517 Cardiomegaly: Secondary | ICD-10-CM | POA: Diagnosis not present

## 2016-10-12 DIAGNOSIS — I359 Nonrheumatic aortic valve disorder, unspecified: Secondary | ICD-10-CM | POA: Diagnosis present

## 2016-10-14 ENCOUNTER — Other Ambulatory Visit: Payer: Self-pay | Admitting: Family Medicine

## 2016-10-14 DIAGNOSIS — N400 Enlarged prostate without lower urinary tract symptoms: Secondary | ICD-10-CM

## 2016-10-17 ENCOUNTER — Telehealth: Payer: Self-pay | Admitting: *Deleted

## 2016-10-17 NOTE — Telephone Encounter (Signed)
-----   Message from [facility] [place] sent at 10/13/2016 12:34 PM EST ----- Please let the patient know his echo looked OK.  Valve was difficult to see well, but measurements looked OK.  Thanks [employer]

## 2016-10-17 NOTE — Telephone Encounter (Signed)
PT NOTIFIED OF RESULTS

## 2016-10-20 ENCOUNTER — Ambulatory Visit (INDEPENDENT_AMBULATORY_CARE_PROVIDER_SITE_OTHER): Payer: Commercial Managed Care - HMO | Admitting: Pharmacist Clinician (PhC)/ Clinical Pharmacy Specialist

## 2016-10-20 DIAGNOSIS — Z952 Presence of prosthetic heart valve: Secondary | ICD-10-CM | POA: Diagnosis not present

## 2016-10-20 DIAGNOSIS — I482 Chronic atrial fibrillation, unspecified: Secondary | ICD-10-CM

## 2016-10-20 DIAGNOSIS — I480 Paroxysmal atrial fibrillation: Secondary | ICD-10-CM | POA: Diagnosis not present

## 2016-10-20 LAB — COAGUCHEK XS/INR WAIVED
INR: 2.3 — ABNORMAL HIGH (ref 0.9–1.1)
PROTHROMBIN TIME: 27.8 s

## 2016-10-20 NOTE — Patient Instructions (Signed)
Anticoagulation Dose Instructions as of 10/20/2016      [name] Tue Wed Thu Fri Sat   New Dose 3 mg 1.5 mg 3 mg 1.5 mg 3 mg 1.5 mg 3 mg    Description   Continue taking warfarin the same way  INR today is 2.3   * Goal 2.0-2.5 per cardiologist

## 2016-10-24 ENCOUNTER — Other Ambulatory Visit: Payer: Self-pay | Admitting: Family Medicine

## 2016-11-16 LAB — CUP PACEART INCLINIC DEVICE CHECK
Battery Remaining Longevity: 114 mo
Brady Statistic RA Percent Paced: 48 %
Brady Statistic RV Percent Paced: 8 %
Implantable Lead Implant Date: 20130919
Implantable Lead Location: 753859
Implantable Pulse Generator Implant Date: 20130919
Lead Channel Impedance Value: 429 Ohm
Lead Channel Pacing Threshold Amplitude: 0.9 V
Lead Channel Pacing Threshold Pulse Width: 0.4 ms
Lead Channel Sensing Intrinsic Amplitude: 3.4 mV
Lead Channel Setting Pacing Amplitude: 2 V
Lead Channel Setting Pacing Pulse Width: 0.4 ms
MDC IDC LEAD IMPLANT DT: 20130919
MDC IDC LEAD LOCATION: 753860
MDC IDC MSMT LEADCHNL RV IMPEDANCE VALUE: 507 Ohm
MDC IDC MSMT LEADCHNL RV PACING THRESHOLD AMPLITUDE: 0.7 V
MDC IDC MSMT LEADCHNL RV PACING THRESHOLD PULSEWIDTH: 0.4 ms
MDC IDC MSMT LEADCHNL RV SENSING INTR AMPL: 13.8 mV
MDC IDC SESS DTM: 20180104102438
MDC IDC SET LEADCHNL RA PACING AMPLITUDE: 2.4 V
Pulse Gen Serial Number: 66244569

## 2016-11-16 NOTE — Progress Notes (Signed)
Pacemaker check in clinic. Normal device function. Thresholds, sensing, impedances consistent with previous measurements. Device programmed to maximize longevity. 20 mode switches, longest lasting 22sec + coumadin. No high ventricular rates noted. Device programmed at appropriate safety margins. Histogram distribution appropriate for patient activity level. Device programmed to optimize intrinsic conduction. Estimated longevity 14yrs and 51mo. ROV with SK 03/2017. Patient education completed.

## 2016-11-28 ENCOUNTER — Encounter: Payer: Self-pay | Admitting: Family Medicine

## 2016-11-28 ENCOUNTER — Ambulatory Visit (INDEPENDENT_AMBULATORY_CARE_PROVIDER_SITE_OTHER): Payer: Commercial Managed Care - HMO | Admitting: Family Medicine

## 2016-11-28 VITALS — BP 124/71 | HR 82 | Temp 97.0°F | Ht 68.0 in | Wt 146.0 lb

## 2016-11-28 DIAGNOSIS — Z950 Presence of cardiac pacemaker: Secondary | ICD-10-CM

## 2016-11-28 DIAGNOSIS — I480 Paroxysmal atrial fibrillation: Secondary | ICD-10-CM

## 2016-11-28 DIAGNOSIS — Z952 Presence of prosthetic heart valve: Secondary | ICD-10-CM | POA: Diagnosis not present

## 2016-11-28 DIAGNOSIS — Z7901 Long term (current) use of anticoagulants: Secondary | ICD-10-CM

## 2016-11-28 DIAGNOSIS — I25118 Atherosclerotic heart disease of native coronary artery with other forms of angina pectoris: Secondary | ICD-10-CM

## 2016-11-28 DIAGNOSIS — E039 Hypothyroidism, unspecified: Secondary | ICD-10-CM | POA: Diagnosis not present

## 2016-11-28 DIAGNOSIS — E782 Mixed hyperlipidemia: Secondary | ICD-10-CM | POA: Diagnosis not present

## 2016-11-28 LAB — URINALYSIS, COMPLETE
Bilirubin, UA: NEGATIVE
GLUCOSE, UA: NEGATIVE
Ketones, UA: NEGATIVE
LEUKOCYTES UA: NEGATIVE
Nitrite, UA: NEGATIVE
PH UA: 7 (ref 5.0–7.5)
Protein, UA: NEGATIVE
RBC, UA: NEGATIVE
Specific Gravity, UA: 1.015 (ref 1.005–1.030)
UUROB: 1 mg/dL (ref 0.2–1.0)

## 2016-11-28 LAB — COAGUCHEK XS/INR WAIVED
INR: 2.7 — AB (ref 0.9–1.1)
PROTHROMBIN TIME: 32.8 s

## 2016-11-28 LAB — MICROSCOPIC EXAMINATION
BACTERIA UA: NONE SEEN
Epithelial Cells (non renal): NONE SEEN /hpf (ref 0–10)
RBC, UA: NONE SEEN /hpf (ref 0–?)
RENAL EPITHEL UA: NONE SEEN /HPF
WBC, UA: NONE SEEN /hpf (ref 0–?)

## 2016-11-28 NOTE — Progress Notes (Signed)
Subjective:  Patient ID: [name], male    DOB: 09/20/1928  Age: 83 y.o. MRN: [id number]  CC: Annual Exam (pt here today for CPE, no concerns voiced)   HPI [name] presents for Patient has had multiple illnesses recently. His stroke has resolved completely. He feels that he has strength equal on the right left. Patient presents for follow-up on  thyroid. The patient has a history of hypothyroidism for many years. It has been stable recently. Pt. denies any change in  voice, loss of hair, heat or cold intolerance. Energy level has been adequate to good. Patient denies constipation and diarrhea. No myxedema. Medication is as noted below. Verified that pt is taking it daily on an empty stomach. Well tolerated.  Patient in for follow-up of atrial fibrillation. Patient denies any recent bouts of chest pain or palpitations. Patient is also followed for aortic valve replacement with a mechanical valve. Additionally, patient is taking anticoagulants. Patient denies any recent excessive bleeding episodes including epistaxis, bleeding from the gums, genitalia, rectal bleeding or hematuria. Additionally there has been no excessive bruising. Followed by low bowel or cardiology for his pacemaker.  Patient in for follow-up of elevated cholesterol. Doing well without complaints on current medication. Denies side effects of statin including myalgia and arthralgia and nausea. He takes Zetia as well. Also in today for liver function testing. Currently no chest pain, shortness of breath or other cardiovascular related symptoms noted.    History [name] has a past medical history of Aortic stenosis; CAD (coronary artery disease); Carotid stenosis; Cataract; CVA (cerebral infarction) (12/2010); DVT (deep venous thrombosis) ([place]); History of pulmonary embolism; History of rib fracture; History of shingles; Hyperlipidemia; Hypothyroidism; Pacemaker  Biotronik; Personal history of subdural hematoma; S/P  cholecystectomy (2005); S/P shoulder surgery; Stroke [facility]) (2012); and Syncope.   He has a past surgical history that includes Aortic valve replacement (1985); Cardiac catheterization; Vascular surgery; Brain surgery; permanent pacemaker insertion (N/A, 08/01/2012); and Eye surgery.   His family history includes Arthritis in his sister; Cancer in his father; Coronary artery disease in his other; Heart attack in his brother, brother, and father; Heart disease in his brother and father; Stroke in his mother.He reports that he has never smoked. He has never used smokeless tobacco. He reports that he does not drink alcohol or use drugs.    ROS Review of Systems  Constitutional: Negative for chills, diaphoresis, fever and unexpected weight change.  HENT: Negative for congestion, hearing loss, rhinorrhea and sore throat.   Eyes: Negative for visual disturbance.  Respiratory: Negative for cough and shortness of breath.   Cardiovascular: Negative for chest pain.  Gastrointestinal: Negative for abdominal pain, constipation and diarrhea.  Genitourinary: Negative for dysuria and flank pain.  Musculoskeletal: Negative for arthralgias and joint swelling.  Skin: Negative for rash.  Neurological: Negative for dizziness and headaches.  Psychiatric/Behavioral: Negative for dysphoric mood and sleep disturbance.    Objective:  BP 124/71   Pulse 82   Temp 97 F (36.1 C) (Oral)   Ht _0  (1.727 m)   Wt 146 lb (66.2 kg)   BMI 22.20 kg/m   BP Readings from Last 3 Encounters:  11/28/16 124/71  04/12/16 126/74  03/10/16 130/64    Wt Readings from Last 3 Encounters:  11/28/16 146 lb (66.2 kg)  04/12/16 144 lb (65.3 kg)  03/10/16 147 lb (66.7 kg)     Physical Exam  Constitutional: He is oriented to person, place, and time. He appears  well-developed and well-nourished. No distress.  HENT:  Head: Normocephalic and atraumatic.  Right Ear: External ear normal.  Left Ear: External ear normal.    Nose: Nose normal.  Mouth/Throat: Oropharynx is clear and moist.  Eyes: Conjunctivae and EOM are normal. Pupils are equal, round, and reactive to light.  Neck: Normal range of motion. Neck supple. No thyromegaly present.  Cardiovascular: Normal rate, regular rhythm and normal heart sounds.   No murmur heard. Pulmonary/Chest: Effort normal and breath sounds normal. No respiratory distress. He has no wheezes. He has no rales.  Abdominal: Soft. Bowel sounds are normal. He exhibits no distension. There is no tenderness.  Lymphadenopathy:    He has no cervical adenopathy.  Neurological: He is alert and oriented to person, place, and time. He has normal reflexes.  Skin: Skin is warm and dry.  Psychiatric: He has a normal mood and affect. His behavior is normal. Judgment and thought content normal.      Assessment & Plan:   [name] was seen today for annual exam.  Diagnoses and all orders for this visit:  Mixed hyperlipidemia -     CBC with Differential/Platelet -     CMP14+EGFR -     Lipid panel -     Urinalysis, Complete  Atherosclerosis of native coronary artery of native heart with stable angina pectoris (HCC) -     CBC with Differential/Platelet -     CMP14+EGFR -     Urinalysis, Complete -     EKG 12-Lead  Aortic valve replacement -     CBC with Differential/Platelet -     CMP14+EGFR -     Urinalysis, Complete -     EKG 12-Lead  Long term current use of anticoagulant -     CBC with Differential/Platelet -     CMP14+EGFR -     CoaguChek XS/INR Waived -     Urinalysis, Complete -     EKG 12-Lead  Pacemaker-Biotronik -     CBC with Differential/Platelet -     CMP14+EGFR -     Urinalysis, Complete -     EKG 12-Lead  AF (paroxysmal atrial fibrillation) (HCC) -     CBC with Differential/Platelet -     CMP14+EGFR -     CoaguChek XS/INR Waived -     Urinalysis, Complete -     EKG 12-Lead  Hypothyroidism, unspecified type -     CBC with Differential/Platelet -      CMP14+EGFR -     TSH + free T4 -     Urinalysis, Complete  Other orders -     Microscopic Examination     I am having [name] maintain his multivitamin, docusate sodium, aspirin EC, meclizine, fluocinonide-emollient, cetaphil, alendronate, simvastatin, metoprolol succinate, COUMADIN, ezetimibe, tamsulosin, pantoprazole, levothyroxine, and latanoprost.  Allergies as of 11/28/2016      Reactions   Ivp Dye [iodinated Diagnostic Agents] Swelling   Swelling   Codeine Nausea Only   Nausea   Sulfonamide Derivatives Hives, Itching   Hives itching      Medication List       Accurate as of 11/28/16  5:22 PM. Always use your most recent med list.          alendronate 70 MG tablet Commonly known as:  FOSAMAX Take 1 tablet (70 mg total) by mouth once a week. Take with a full glass of water on an empty stomach.   aspirin EC 81 MG tablet Take 1 tablet (81  mg total) by mouth daily.   cetaphil lotion Apply 1 application topically as needed for dry skin.   COUMADIN 3 MG tablet Generic drug:  warfarin TAKE 1 DAILY EXCEPT 1/2 TABLET ON MONDAY AND FRIDAY   docusate sodium 100 MG capsule Commonly known as:  COLACE Take 100 mg by mouth daily.   ezetimibe 10 MG tablet Commonly known as:  ZETIA Take 1 tablet (10 mg total) by mouth daily.   fluocinonide-emollient 0.05 % cream Commonly known as:  LIDEX-E Apply 1 application topically 2 (two) times daily.   latanoprost 0.005 % ophthalmic solution Commonly known as:  XALATAN INSTILL 1 DROP INTO RIGHT EYE AT BEDTIME - BEGIN NEW BOTTLE EVERY 42 DAYS   levothyroxine 75 MCG tablet Commonly known as:  SYNTHROID, LEVOTHROID TAKE 1 TABLET EVERY DAY   meclizine 50 MG tablet Commonly known as:  ANTIVERT Take 1 tablet (50 mg total) by mouth 3 (three) times daily as needed.   metoprolol succinate 25 MG 24 hr tablet Commonly known as:  TOPROL-XL TAKE 1 TABLET EVERY DAY   multivitamin tablet Take 1 tablet by mouth daily.    pantoprazole 40 MG tablet Commonly known as:  PROTONIX TAKE 1 TABLET EVERY DAY   simvastatin 40 MG tablet Commonly known as:  ZOCOR TAKE 1 TABLET AT BEDTIME   tamsulosin 0.4 MG Caps capsule Commonly known as:  FLOMAX TAKE 1 CAPSULE (0.4 MG TOTAL) BY MOUTH 2 (TWO) TIMES DAILY.        Follow-up: Return in about 3 months (around 02/26/2017).  Claretta Fraise, M.D.

## 2016-11-29 LAB — CMP14+EGFR
A/G RATIO: 1.3 (ref 1.2–2.2)
ALBUMIN: 4 g/dL (ref 3.5–4.7)
ALK PHOS: 86 IU/L (ref 39–117)
ALT: 13 IU/L (ref 0–44)
AST: 23 IU/L (ref 0–40)
BUN / CREAT RATIO: 12 (ref 10–24)
BUN: 16 mg/dL (ref 8–27)
Bilirubin Total: 0.6 mg/dL (ref 0.0–1.2)
CO2: 25 mmol/L (ref 18–29)
Calcium: 8.8 mg/dL (ref 8.6–10.2)
Chloride: 102 mmol/L (ref 96–106)
Creatinine, Ser: 1.36 mg/dL — ABNORMAL HIGH (ref 0.76–1.27)
GFR calc Af Amer: 54 mL/min/{1.73_m2} — ABNORMAL LOW (ref >59)
GFR calc non Af Amer: 47 mL/min/{1.73_m2} — ABNORMAL LOW (ref >59)
GLOBULIN, TOTAL: 3.2 g/dL (ref 1.5–4.5)
Glucose: 97 mg/dL (ref 65–99)
POTASSIUM: 4.4 mmol/L (ref 3.5–5.2)
SODIUM: 141 mmol/L (ref 134–144)
Total Protein: 7.2 g/dL (ref 6.0–8.5)

## 2016-11-29 LAB — CBC WITH DIFFERENTIAL/PLATELET
BASOS ABS: 0 10*3/uL (ref 0.0–0.2)
Basos: 1 %
EOS (ABSOLUTE): 0.1 10*3/uL (ref 0.0–0.4)
Eos: 2 %
HEMOGLOBIN: 11.7 g/dL — AB (ref 13.0–17.7)
Hematocrit: 36.7 % — ABNORMAL LOW (ref 37.5–51.0)
IMMATURE GRANS (ABS): 0 10*3/uL (ref 0.0–0.1)
Immature Granulocytes: 0 %
LYMPHS: 16 %
Lymphocytes Absolute: 0.7 10*3/uL (ref 0.7–3.1)
MCH: 31.5 pg (ref 26.6–33.0)
MCHC: 31.9 g/dL (ref 31.5–35.7)
MCV: 99 fL — ABNORMAL HIGH (ref 79–97)
MONOCYTES: 11 %
Monocytes Absolute: 0.5 10*3/uL (ref 0.1–0.9)
NEUTROS PCT: 70 %
Neutrophils Absolute: 2.9 10*3/uL (ref 1.4–7.0)
PLATELETS: 169 10*3/uL (ref 150–379)
RBC: 3.72 x10E6/uL — ABNORMAL LOW (ref 4.14–5.80)
RDW: 13.6 % (ref 12.3–15.4)
WBC: 4.2 10*3/uL (ref 3.4–10.8)

## 2016-11-29 LAB — LIPID PANEL
CHOL/HDL RATIO: 2.4 ratio (ref 0.0–5.0)
Cholesterol, Total: 123 mg/dL (ref 100–199)
HDL: 51 mg/dL (ref >39)
LDL Calculated: 56 mg/dL (ref 0–99)
Triglycerides: 79 mg/dL (ref 0–149)
VLDL CHOLESTEROL CAL: 16 mg/dL (ref 5–40)

## 2016-11-29 LAB — TSH+FREE T4
FREE T4: 1.37 ng/dL (ref 0.82–1.77)
TSH: 0.518 u[IU]/mL (ref 0.450–4.500)

## 2016-12-06 ENCOUNTER — Other Ambulatory Visit: Payer: Self-pay | Admitting: Family Medicine

## 2016-12-06 ENCOUNTER — Other Ambulatory Visit: Payer: Self-pay

## 2016-12-06 ENCOUNTER — Telehealth: Payer: Self-pay | Admitting: Family Medicine

## 2016-12-06 LAB — IRON AND TIBC
Iron Saturation: 31 % (ref 15–55)
Iron: 62 ug/dL (ref 38–169)
Total Iron Binding Capacity: 200 ug/dL — ABNORMAL LOW (ref 250–450)
UIBC: 138 ug/dL (ref 111–343)

## 2016-12-06 LAB — SPECIMEN STATUS REPORT

## 2016-12-06 LAB — FERRITIN: FERRITIN: 192 ng/mL (ref 30–400)

## 2016-12-06 LAB — VITAMIN B12: VITAMIN B 12: 885 pg/mL (ref 232–1245)

## 2016-12-06 LAB — FOLATE: Folate: >20 ng/mL (ref >3.0)

## 2016-12-06 MED ORDER — METOPROLOL SUCCINATE ER 25 MG PO TB24: 25.0000 mg | ORAL_TABLET | Freq: Every day | ORAL | 1 refills | Status: DC

## 2016-12-06 NOTE — Telephone Encounter (Signed)
done

## 2016-12-07 NOTE — Telephone Encounter (Signed)
done

## 2016-12-08 ENCOUNTER — Ambulatory Visit (INDEPENDENT_AMBULATORY_CARE_PROVIDER_SITE_OTHER): Payer: Medicare HMO | Admitting: Pharmacist

## 2016-12-08 DIAGNOSIS — I482 Chronic atrial fibrillation, unspecified: Secondary | ICD-10-CM

## 2016-12-08 DIAGNOSIS — Z952 Presence of prosthetic heart valve: Secondary | ICD-10-CM

## 2016-12-08 DIAGNOSIS — I480 Paroxysmal atrial fibrillation: Secondary | ICD-10-CM

## 2016-12-08 LAB — COAGUCHEK XS/INR WAIVED
INR: 2.6 — AB (ref 0.9–1.1)
Prothrombin Time: 31.1 s

## 2016-12-08 NOTE — Patient Instructions (Signed)
Anticoagulation Dose Instructions as of 12/08/2016      [name] Tue Wed Thu Fri Sat   New Dose 3 mg 1.5 mg 3 mg 1.5 mg 3 mg 1.5 mg 3 mg    Description   Continue taking warfarin the same way  INR today is 2.6 (little thin) can eat some Vitamin K food today to bring back down to range. * Goal 2.0-2.5 per cardiologist

## 2017-01-19 ENCOUNTER — Ambulatory Visit (INDEPENDENT_AMBULATORY_CARE_PROVIDER_SITE_OTHER): Payer: Medicare HMO | Admitting: Pharmacist

## 2017-01-19 DIAGNOSIS — Z952 Presence of prosthetic heart valve: Secondary | ICD-10-CM

## 2017-01-19 DIAGNOSIS — I482 Chronic atrial fibrillation, unspecified: Secondary | ICD-10-CM

## 2017-01-19 DIAGNOSIS — I480 Paroxysmal atrial fibrillation: Secondary | ICD-10-CM | POA: Diagnosis not present

## 2017-01-19 LAB — COAGUCHEK XS/INR WAIVED
INR: 2.7 — AB (ref 0.9–1.1)
Prothrombin Time: 32.5 s

## 2017-01-19 NOTE — Patient Instructions (Signed)
Anticoagulation Dose Instructions as of 01/19/2017      [name] Tue Wed Thu Fri Sat   New Dose 3 mg 1.5 mg 3 mg 1.5 mg 3 mg 1.5 mg 1.5 mg    Description   Decrease warfarin to 1 tablet on Sunday, Tuesday, Thursday, and 1/2 tablet on Monday, Wednesday, Friday, and Saturday.  INR today is 2.7 (little thin) * Goal 2.0-2.5 per cardiologist

## 2017-02-23 ENCOUNTER — Ambulatory Visit (INDEPENDENT_AMBULATORY_CARE_PROVIDER_SITE_OTHER): Payer: Medicare HMO | Admitting: Pharmacist Clinician (PhC)/ Clinical Pharmacy Specialist

## 2017-02-23 DIAGNOSIS — Z952 Presence of prosthetic heart valve: Secondary | ICD-10-CM | POA: Diagnosis not present

## 2017-02-23 DIAGNOSIS — I482 Chronic atrial fibrillation, unspecified: Secondary | ICD-10-CM

## 2017-02-23 DIAGNOSIS — I480 Paroxysmal atrial fibrillation: Secondary | ICD-10-CM

## 2017-02-23 LAB — COAGUCHEK XS/INR WAIVED
INR: 2.6 — AB (ref 0.9–1.1)
PROTHROMBIN TIME: 31.4 s

## 2017-02-23 NOTE — Patient Instructions (Signed)
Anticoagulation Dose Instructions as of 02/23/2017      [name] Tue Wed Thu Fri Sat   New Dose 3 mg 1.5 mg 3 mg 1.5 mg 3 mg 1.5 mg 1.5 mg    Description   Continue taking warfarin the same way.  Increase vitamin K in diet.  INR today is 2.6 (little thin) * Goal 2.0-2.5 per cardiologist

## 2017-02-28 ENCOUNTER — Other Ambulatory Visit: Payer: Self-pay | Admitting: Family Medicine

## 2017-03-07 ENCOUNTER — Other Ambulatory Visit: Payer: Self-pay | Admitting: Family Medicine

## 2017-03-19 ENCOUNTER — Encounter: Payer: Self-pay | Admitting: Internal Medicine

## 2017-03-19 ENCOUNTER — Other Ambulatory Visit: Payer: Self-pay | Admitting: Family Medicine

## 2017-03-19 DIAGNOSIS — N400 Enlarged prostate without lower urinary tract symptoms: Secondary | ICD-10-CM

## 2017-04-03 ENCOUNTER — Ambulatory Visit (INDEPENDENT_AMBULATORY_CARE_PROVIDER_SITE_OTHER): Payer: Medicare HMO | Admitting: Internal Medicine

## 2017-04-03 ENCOUNTER — Encounter: Payer: Self-pay | Admitting: Internal Medicine

## 2017-04-03 VITALS — BP 116/60 | HR 84 | Ht 68.0 in | Wt 145.5 lb

## 2017-04-03 DIAGNOSIS — Z952 Presence of prosthetic heart valve: Secondary | ICD-10-CM | POA: Diagnosis not present

## 2017-04-03 DIAGNOSIS — I480 Paroxysmal atrial fibrillation: Secondary | ICD-10-CM | POA: Diagnosis not present

## 2017-04-03 DIAGNOSIS — Z950 Presence of cardiac pacemaker: Secondary | ICD-10-CM | POA: Diagnosis not present

## 2017-04-03 DIAGNOSIS — G9001 Carotid sinus syncope: Secondary | ICD-10-CM

## 2017-04-03 DIAGNOSIS — I495 Sick sinus syndrome: Secondary | ICD-10-CM

## 2017-04-03 NOTE — Patient Instructions (Addendum)
Medication Instructions: - Your physician recommends that you continue on your current medications as directed. Please refer to the Current Medication list given to you today.  Labwork: - Your physician recommends that you have lab work today: CBC  Procedures/Testing: - none ordered  Follow-Up: - Your physician wants you to follow-up in: 1 year with Yevette Edwards, PA for Dr. Graciela Husbands. You will receive a reminder letter in the mail two months in advance. If you don't receive a letter, please call our office to schedule the follow-up appointment.   Any Additional Special Instructions Will Be Listed Below (If Applicable).     If you need a refill on your cardiac medications before your next appointment, please call your pharmacy.

## 2017-04-03 NOTE — Progress Notes (Signed)
Patient Care Team: [name], MD as PCP - General (Family Medicine) Ernesto Rutherford, MD as Consulting Physician (Ophthalmology) Duke Salvia, MD as Consulting Physician (Cardiology)   HPI  [name] is a 83 y.o. male Seen in follow-up for carotid hypersensitivity and syncope. He is status post Biotronik CLS pacemaker implantation.  He has a history of mechanical aortic valve he takes aspirin and Coumadin. He has a history of a craniotomy following a subdural hematoma.  Echocardiogram 11/17 normal left ventricular function normal valve function  Date Cr Hgb  1/18 1.36 11.7   He has mild shortness of breath with significant exertion. He denies edema or chest pain. He has had no palpitations.  Denies bleeding issues.  He has recurrent episodes of lightheadedness. These occur primarily with rotating his head.   Records and Results Reviewed   Past Medical History:  Diagnosis Date  . Aortic stenosis    St.Jude AVR in 1985; Echo 8/12: EF 50-55%, mild LVH, mech AV with mild AI, mean gradient 14 mmHg, mild to mod MR;  Echo 8/13: EF 60-65%, AVR ok with mean gradient 19 mmHg  . CAD (coronary artery disease)    Mild, nonobstructive at catheterization 5/01  . Carotid stenosis    Dopplers 8/13: RICA 40-50% and LICA 0-39% => followup 1 year  . Cataract   . CVA (cerebral infarction) 12/2010  . DVT (deep venous thrombosis) (HCC)   . History of pulmonary embolism    post op DVT/PE;  s/p IVC filter  . History of rib fracture   . History of shingles   . Hyperlipidemia   . Hypothyroidism   . Pacemaker  Biotronik   . Personal history of subdural hematoma    Requiring craniotomy  . S/P cholecystectomy 2005  . S/P shoulder surgery    left  . Stroke (HCC) 2012  . Syncope    + carotid sinus massage    Past Surgical History:  Procedure Laterality Date  . AORTIC VALVE REPLACEMENT  1985   St. Jude / mechanical  . BRAIN SURGERY    . CARDIAC CATHETERIZATION    .  EYE SURGERY     bilateral cataract  . PERMANENT PACEMAKER INSERTION N/A 08/01/2012   Procedure: PERMANENT PACEMAKER INSERTION;  Surgeon: Duke Salvia, MD;  Location: [facility] CATH LAB;  Service: Cardiovascular;  Laterality: N/A;  . VASCULAR SURGERY      Current Outpatient Prescriptions  Medication Sig Dispense Refill  . aspirin EC 81 MG tablet Take 1 tablet (81 mg total) by mouth daily.    Marland Kitchen COUMADIN 3 MG tablet TAKE 1 DAILY EXCEPT 1/2 TABLET ON MONDAY AND FRIDAY 90 tablet 0  . docusate sodium (COLACE) 100 MG capsule Take 100 mg by mouth daily.     Marland Kitchen ezetimibe (ZETIA) 10 MG tablet Take 1 tablet (10 mg total) by mouth daily. 90 tablet 3  . fluocinonide-emollient (LIDEX-E) 0.05 % cream Apply 1 application topically 2 (two) times daily. 60 g 5  . latanoprost (XALATAN) 0.005 % ophthalmic solution INSTILL 1 DROP INTO RIGHT EYE AT BEDTIME - BEGIN NEW BOTTLE EVERY 42 DAYS 5 mL 1  . levothyroxine (SYNTHROID, LEVOTHROID) 75 MCG tablet TAKE 1 TABLET EVERY DAY 90 tablet 0  . meclizine (ANTIVERT) 50 MG tablet Take 1 tablet (50 mg total) by mouth 3 (three) times daily as needed. 30 tablet 0  . metoprolol succinate (TOPROL-XL) 25 MG 24 hr tablet TAKE 1 TABLET EVERY DAY 90 tablet 1  .  Multiple Vitamin (MULTIVITAMIN) tablet Take 1 tablet by mouth daily.      . pantoprazole (PROTONIX) 40 MG tablet TAKE 1 TABLET EVERY DAY 90 tablet 0  . simvastatin (ZOCOR) 40 MG tablet TAKE 1 TABLET AT BEDTIME 90 tablet 1  . tamsulosin (FLOMAX) 0.4 MG CAPS capsule TAKE 1 CAPSULE (0.4 MG TOTAL) BY MOUTH 2 (TWO) TIMES DAILY. 180 capsule 0   No current facility-administered medications for this visit.     Allergies  Allergen Reactions  . Ivp Dye [Iodinated Diagnostic Agents] Swelling    Swelling   . Codeine Nausea Only    Nausea   . Sulfonamide Derivatives Hives and Itching    Hives itching       Review of Systems negative except from HPI and PMH  Physical Exam BP 116/60   Pulse 84   Ht 5\' 8"  (1.727 m)   Wt 145  lb 8 oz (66 kg)   SpO2 97%   BMI 22.12 kg/m  Well developed and nourished in no acute distress HENT normal Neck supple with JVP-flat Device pocket well healed; without hematoma or erythema.  There is no tethering  Carotids brisk and full without bruits Clear Regular rate and rhythm, no murmurs or gallops Abd-soft with active BS without hepatomegaly No Clubbing cyanosis edema Skin-warm and dry A & Oriented  Grossly normal sensory and motor function   ECG demonstrates AV pacing with a paced AV delay of 300 ms    Assessment and  Plan  Carotid sinus hypersensitivity   sinus node dysfunction/chronotropic incompetence  Pacemaker-Biotronik  Hypertension  Atrial fibrillation  Aortic valve replacement-mechanical  Orthostatic dizziness  He has intermittent dizziness which I presume is related to carotid sinus vasomotor instability. We have discussed the importance of maintaining head position   Heart rate excursion is reasonable. He is ventricularly pacing about 11%. This is with a long AV delay.   On Anticoagulation;  No bleeding issues   Will check Hgb  BP well controlled        Current medicines are reviewed at length with the patient today .  The patient does not  have concerns regarding medicines.

## 2017-04-04 LAB — CBC WITH DIFFERENTIAL/PLATELET
BASOS: 0 %
Basophils Absolute: 0 10*3/uL (ref 0.0–0.2)
EOS (ABSOLUTE): 0.1 10*3/uL (ref 0.0–0.4)
Eos: 3 %
HEMATOCRIT: 36.9 % — AB (ref 37.5–51.0)
Hemoglobin: 12 g/dL — ABNORMAL LOW (ref 13.0–17.7)
IMMATURE GRANS (ABS): 0 10*3/uL (ref 0.0–0.1)
IMMATURE GRANULOCYTES: 0 %
LYMPHS: 26 %
Lymphocytes Absolute: 1 10*3/uL (ref 0.7–3.1)
MCH: 32.5 pg (ref 26.6–33.0)
MCHC: 32.5 g/dL (ref 31.5–35.7)
MCV: 100 fL — AB (ref 79–97)
Monocytes Absolute: 0.5 10*3/uL (ref 0.1–0.9)
Monocytes: 13 %
NEUTROS ABS: 2.4 10*3/uL (ref 1.4–7.0)
Neutrophils: 58 %
PLATELETS: 148 10*3/uL — AB (ref 150–379)
RBC: 3.69 x10E6/uL — ABNORMAL LOW (ref 4.14–5.80)
RDW: 13.5 % (ref 12.3–15.4)
WBC: 4.1 10*3/uL (ref 3.4–10.8)

## 2017-04-10 LAB — CUP PACEART INCLINIC DEVICE CHECK
Date Time Interrogation Session: 20180522184700
Implantable Lead Implant Date: 20130919
Implantable Lead Location: 753860
Implantable Pulse Generator Implant Date: 20130919
Lead Channel Impedance Value: 487 Ohm
Lead Channel Pacing Threshold Amplitude: 0.8 V
Lead Channel Sensing Intrinsic Amplitude: 3.3 mV
Lead Channel Setting Pacing Amplitude: 1.8 V
MDC IDC LEAD IMPLANT DT: 20130919
MDC IDC LEAD LOCATION: 753859
MDC IDC MSMT LEADCHNL RA IMPEDANCE VALUE: 429 Ohm
MDC IDC MSMT LEADCHNL RA PACING THRESHOLD AMPLITUDE: 0.8 V
MDC IDC MSMT LEADCHNL RA PACING THRESHOLD PULSEWIDTH: 0.4 ms
MDC IDC MSMT LEADCHNL RV PACING THRESHOLD PULSEWIDTH: 0.4 ms
MDC IDC MSMT LEADCHNL RV SENSING INTR AMPL: 13.5 mV
MDC IDC MSMT LEADCHNL RV SENSING INTR AMPL: 3.3 mV
MDC IDC PG SERIAL: 66244569
MDC IDC SET LEADCHNL RV PACING AMPLITUDE: 2 V
MDC IDC SET LEADCHNL RV PACING PULSEWIDTH: 0.4 ms

## 2017-04-13 ENCOUNTER — Ambulatory Visit (INDEPENDENT_AMBULATORY_CARE_PROVIDER_SITE_OTHER): Payer: Medicare HMO | Admitting: Pharmacist Clinician (PhC)/ Clinical Pharmacy Specialist

## 2017-04-13 DIAGNOSIS — I480 Paroxysmal atrial fibrillation: Secondary | ICD-10-CM | POA: Diagnosis not present

## 2017-04-13 DIAGNOSIS — Z952 Presence of prosthetic heart valve: Secondary | ICD-10-CM

## 2017-04-13 DIAGNOSIS — I482 Chronic atrial fibrillation, unspecified: Secondary | ICD-10-CM

## 2017-04-13 LAB — COAGUCHEK XS/INR WAIVED
INR: 2.2 — ABNORMAL HIGH (ref 0.9–1.1)
PROTHROMBIN TIME: 26.6 s

## 2017-04-13 NOTE — Patient Instructions (Signed)
Anticoagulation Warfarin Dose Instructions as of 04/13/2017      [name] Tue Wed Thu Fri Sat   New Dose 3 mg 1.5 mg 3 mg 1.5 mg 3 mg 1.5 mg 1.5 mg    Description   Continue taking warfarin the same way.   INR today is 2.2 * Goal 2.0-2.5 per cardiologist

## 2017-05-03 ENCOUNTER — Other Ambulatory Visit: Payer: Self-pay | Admitting: Family Medicine

## 2017-05-25 ENCOUNTER — Ambulatory Visit (INDEPENDENT_AMBULATORY_CARE_PROVIDER_SITE_OTHER): Payer: Medicare HMO | Admitting: Pharmacist Clinician (PhC)/ Clinical Pharmacy Specialist

## 2017-05-25 DIAGNOSIS — I480 Paroxysmal atrial fibrillation: Secondary | ICD-10-CM

## 2017-05-25 DIAGNOSIS — I482 Chronic atrial fibrillation, unspecified: Secondary | ICD-10-CM

## 2017-05-25 DIAGNOSIS — Z952 Presence of prosthetic heart valve: Secondary | ICD-10-CM | POA: Diagnosis not present

## 2017-05-25 LAB — COAGUCHEK XS/INR WAIVED
INR: 2 — ABNORMAL HIGH (ref 0.9–1.1)
PROTHROMBIN TIME: 23.5 s

## 2017-05-25 NOTE — Patient Instructions (Signed)
Anticoagulation Warfarin Dose Instructions as of 05/25/2017      [name] Tue Wed Thu Fri Sat   New Dose 3 mg 1.5 mg 3 mg 1.5 mg 3 mg 1.5 mg 1.5 mg    Description   Continue taking warfarin the same way.   INR today is 2.0 * Goal 2.0-2.5 per cardiologist

## 2017-06-10 ENCOUNTER — Other Ambulatory Visit: Payer: Self-pay | Admitting: Family Medicine

## 2017-06-13 ENCOUNTER — Other Ambulatory Visit: Payer: Self-pay

## 2017-06-13 MED ORDER — LATANOPROST 0.005 % OP SOLN: OPHTHALMIC | 1 refills | Status: DC

## 2017-06-13 NOTE — Telephone Encounter (Signed)
Patient came in to office with his wife for a visit and requested that eye drops that were sent to mail order yesterday be cancelled and sent to [facility] in [place]. Sent in a patient notified

## 2017-07-03 ENCOUNTER — Encounter: Payer: Medicare HMO | Admitting: *Deleted

## 2017-07-13 ENCOUNTER — Ambulatory Visit (INDEPENDENT_AMBULATORY_CARE_PROVIDER_SITE_OTHER): Payer: Medicare HMO | Admitting: Pharmacist Clinician (PhC)/ Clinical Pharmacy Specialist

## 2017-07-13 DIAGNOSIS — Z952 Presence of prosthetic heart valve: Secondary | ICD-10-CM | POA: Diagnosis not present

## 2017-07-13 DIAGNOSIS — I480 Paroxysmal atrial fibrillation: Secondary | ICD-10-CM

## 2017-07-13 DIAGNOSIS — I482 Chronic atrial fibrillation, unspecified: Secondary | ICD-10-CM

## 2017-07-13 LAB — COAGUCHEK XS/INR WAIVED
INR: 1.7 — AB (ref 0.9–1.1)
Prothrombin Time: 19.9 s

## 2017-07-13 NOTE — Patient Instructions (Signed)
Anticoagulation Warfarin Dose Instructions as of 07/13/2017      [name] Tue Wed Thu Fri Sat   New Dose 3 mg 1.5 mg 3 mg 1.5 mg 3 mg 1.5 mg 1.5 mg    Description   Take 1 tablet today and then tomorrow resume regular schedule.  INR today is 1.7 (a little thick today)  * Goal 2.0-2.5 per cardiologist

## 2017-07-24 ENCOUNTER — Other Ambulatory Visit: Payer: Self-pay | Admitting: Family Medicine

## 2017-07-25 NOTE — Telephone Encounter (Signed)
Last seen 1/18  Dr Darlyn Read

## 2017-07-25 NOTE — Telephone Encounter (Signed)
Last seen 11/28/16  Dr Darlyn Read

## 2017-08-03 ENCOUNTER — Ambulatory Visit (INDEPENDENT_AMBULATORY_CARE_PROVIDER_SITE_OTHER): Payer: Medicare HMO | Admitting: Pharmacist Clinician (PhC)/ Clinical Pharmacy Specialist

## 2017-08-03 DIAGNOSIS — I482 Chronic atrial fibrillation, unspecified: Secondary | ICD-10-CM

## 2017-08-03 DIAGNOSIS — I480 Paroxysmal atrial fibrillation: Secondary | ICD-10-CM | POA: Diagnosis not present

## 2017-08-03 DIAGNOSIS — Z952 Presence of prosthetic heart valve: Secondary | ICD-10-CM

## 2017-08-03 LAB — COAGUCHEK XS/INR WAIVED
INR: 2.6 — AB (ref 0.9–1.1)
Prothrombin Time: 31.4 s

## 2017-08-03 NOTE — Patient Instructions (Signed)
Anticoagulation Warfarin Dose Instructions as of 08/03/2017      [name] Tue Wed Thu Fri Sat   New Dose 3 mg 1.5 mg 3 mg 1.5 mg 3 mg 1.5 mg 1.5 mg    Description   Continue taking warfarin the same way  INR today is 2.6 ( goal is 2-2.5 per cardiologist)  Have a serving of a high vitamin K food today

## 2017-08-22 DIAGNOSIS — H40013 Open angle with borderline findings, low risk, bilateral: Secondary | ICD-10-CM | POA: Diagnosis not present

## 2017-08-22 DIAGNOSIS — H2589 Other age-related cataract: Secondary | ICD-10-CM | POA: Diagnosis not present

## 2017-08-22 DIAGNOSIS — H353132 Nonexudative age-related macular degeneration, bilateral, intermediate dry stage: Secondary | ICD-10-CM | POA: Diagnosis not present

## 2017-08-22 DIAGNOSIS — H04123 Dry eye syndrome of bilateral lacrimal glands: Secondary | ICD-10-CM | POA: Diagnosis not present

## 2017-08-22 DIAGNOSIS — H35071 Retinal telangiectasis, right eye: Secondary | ICD-10-CM | POA: Diagnosis not present

## 2017-08-22 DIAGNOSIS — Z961 Presence of intraocular lens: Secondary | ICD-10-CM | POA: Diagnosis not present

## 2017-09-05 ENCOUNTER — Other Ambulatory Visit: Payer: Self-pay | Admitting: Family Medicine

## 2017-09-06 NOTE — Telephone Encounter (Signed)
Last seen 11/28/16  Dr Stacks 

## 2017-09-07 ENCOUNTER — Encounter: Payer: Self-pay | Admitting: Family Medicine

## 2017-09-07 ENCOUNTER — Ambulatory Visit (INDEPENDENT_AMBULATORY_CARE_PROVIDER_SITE_OTHER): Payer: Medicare HMO | Admitting: Family Medicine

## 2017-09-07 ENCOUNTER — Ambulatory Visit (INDEPENDENT_AMBULATORY_CARE_PROVIDER_SITE_OTHER): Payer: Medicare HMO | Admitting: Pharmacist Clinician (PhC)/ Clinical Pharmacy Specialist

## 2017-09-07 VITALS — BP 110/64 | HR 73 | Temp 98.8°F | Ht 68.0 in | Wt 141.0 lb

## 2017-09-07 DIAGNOSIS — I482 Chronic atrial fibrillation, unspecified: Secondary | ICD-10-CM

## 2017-09-07 DIAGNOSIS — I480 Paroxysmal atrial fibrillation: Secondary | ICD-10-CM | POA: Diagnosis not present

## 2017-09-07 DIAGNOSIS — Z952 Presence of prosthetic heart valve: Secondary | ICD-10-CM | POA: Diagnosis not present

## 2017-09-07 DIAGNOSIS — H1031 Unspecified acute conjunctivitis, right eye: Secondary | ICD-10-CM | POA: Diagnosis not present

## 2017-09-07 LAB — COAGUCHEK XS/INR WAIVED
INR: 2.5 — ABNORMAL HIGH (ref 0.9–1.1)
PROTHROMBIN TIME: 30.5 s

## 2017-09-07 MED ORDER — TOBRAMYCIN-DEXAMETHASONE 0.3-0.1 % OP SUSP: OPHTHALMIC | 0 refills | Status: DC

## 2017-09-07 NOTE — Patient Instructions (Signed)
Anticoagulation Warfarin Dose Instructions as of 09/07/2017      [name] Tue Wed Thu Fri Sat   New Dose 3 mg 1.5 mg 3 mg 1.5 mg 3 mg 1.5 mg 1.5 mg    Description   Continue taking warfarin the same way  INR today is 2.5 ( goal is 2-2.5 per cardiologist)  Have a serving of a high vitamin K food today

## 2017-09-07 NOTE — Progress Notes (Signed)
Chief Complaint  Patient presents with  . Eye Problem    pt here today c/o right eye red and draining    HPI  Patient presents today for Says he's been having trouble with his eye for about 6 months but this time he woke up this morning and it was draining and red on the right side. He wiped pus from the right medial epicanthus. He saw his ophthalmologist, Dr. Alden Hipp, for his glaucoma just 2-3 weeks ago. He is also seen his optometrist for new glasses recently.  PMH: Smoking status noted ROS: Per HPI  Objective: BP 110/64   Pulse 73   Temp 98.8 F (37.1 C) (Oral)   Ht 5\' 8"  (1.727 m)   Wt 141 lb (64 kg)   BMI 21.44 kg/m  Gen: NAD, alert, cooperative with exam HEENT: NCAT, right eye is noted to have injected conjunctiva there is some mattering at the medial epicanthus and in the inferior margin. The lower lid has chemosis. The left is unremarkable. Both have evidence for previous cataract surgery with lens implants. CV: RRR, good S1/S2, no murmur Ext: No edema, warm Neuro: Alert and oriented, No gross deficits  Assessment and plan:  1. Acute bacterial conjunctivitis of right eye     Meds ordered this encounter  Medications  . tobramycin-dexamethasone (TOBRADEX) ophthalmic solution    Sig: Place 1 drop in eye every 2 hours x 2days, then 1 drop in eye every 4 hours x 5 days.    Dispense:  5 mL    Refill:  0    Orders Placed This Encounter  Procedures  . Ambulatory referral to Ophthalmology    Referral Priority:   Routine    Referral Type:   Consultation    Referral Reason:   Specialty Services Required    Requested Specialty:   Ophthalmology    Number of Visits Requested:   1    Follow up as needed.  [name], MD

## 2017-09-13 ENCOUNTER — Other Ambulatory Visit: Payer: Self-pay | Admitting: Family Medicine

## 2017-09-13 ENCOUNTER — Other Ambulatory Visit: Payer: Self-pay | Admitting: *Deleted

## 2017-09-13 DIAGNOSIS — N400 Enlarged prostate without lower urinary tract symptoms: Secondary | ICD-10-CM

## 2017-09-13 MED ORDER — EZETIMIBE 10 MG PO TABS: 10.0000 mg | ORAL_TABLET | Freq: Every day | ORAL | 0 refills | Status: DC

## 2017-10-03 DIAGNOSIS — H04123 Dry eye syndrome of bilateral lacrimal glands: Secondary | ICD-10-CM | POA: Diagnosis not present

## 2017-10-03 DIAGNOSIS — H40013 Open angle with borderline findings, low risk, bilateral: Secondary | ICD-10-CM | POA: Diagnosis not present

## 2017-10-19 ENCOUNTER — Ambulatory Visit: Payer: Medicare HMO | Admitting: Pharmacist Clinician (PhC)/ Clinical Pharmacy Specialist

## 2017-10-19 DIAGNOSIS — Z952 Presence of prosthetic heart valve: Secondary | ICD-10-CM

## 2017-10-19 DIAGNOSIS — I482 Chronic atrial fibrillation, unspecified: Secondary | ICD-10-CM

## 2017-10-19 DIAGNOSIS — I480 Paroxysmal atrial fibrillation: Secondary | ICD-10-CM | POA: Diagnosis not present

## 2017-10-19 LAB — COAGUCHEK XS/INR WAIVED
INR: 2.4 — ABNORMAL HIGH (ref 0.9–1.1)
Prothrombin Time: 28.5 s

## 2017-10-19 MED ORDER — COUMADIN 3 MG PO TABS: ORAL_TABLET | ORAL | 0 refills | Status: DC

## 2017-10-19 NOTE — Patient Instructions (Signed)
Description   Continue taking warfarin the same way  INR today is 2.4 ( goal is 2-2.5 per cardiologist)

## 2017-12-03 ENCOUNTER — Encounter: Payer: Self-pay | Admitting: Family Medicine

## 2017-12-03 ENCOUNTER — Ambulatory Visit (INDEPENDENT_AMBULATORY_CARE_PROVIDER_SITE_OTHER): Payer: Medicare HMO | Admitting: Family Medicine

## 2017-12-03 VITALS — BP 90/58 | HR 55 | Temp 97.2°F | Ht 67.0 in | Wt 143.0 lb

## 2017-12-03 DIAGNOSIS — R600 Localized edema: Secondary | ICD-10-CM | POA: Diagnosis not present

## 2017-12-03 DIAGNOSIS — N400 Enlarged prostate without lower urinary tract symptoms: Secondary | ICD-10-CM

## 2017-12-03 DIAGNOSIS — Z7901 Long term (current) use of anticoagulants: Secondary | ICD-10-CM | POA: Diagnosis not present

## 2017-12-03 DIAGNOSIS — M7989 Other specified soft tissue disorders: Secondary | ICD-10-CM

## 2017-12-03 LAB — COAGUCHEK XS/INR WAIVED
INR: 3.2 — AB (ref 0.9–1.1)
Prothrombin Time: 38.9 s

## 2017-12-03 MED ORDER — TAMSULOSIN HCL 0.4 MG PO CAPS: 0.4000 mg | ORAL_CAPSULE | Freq: Every day | ORAL | 0 refills | Status: DC

## 2017-12-03 NOTE — Progress Notes (Signed)
Subjective: CC: leg swelling PCP: Stanley Fraise, MD YHC:WCBJSE [name] is a 84 y.o. male presenting to clinic today for:  1.  Bilateral lower extremity swelling Patient is brought to office by his son, who reports that he has had bilateral lower extremity swelling for the last several days.  Patient notes that he has been having a heavy feeling in bilateral lower extremities for the last 2 weeks.  He denies shortness of breath, orthopnea, chest pain, dizziness.  No bleeding from anywhere.  No change in food or activity.  His son notes that he recently took over the patient's medications as he seems forgetful and he is not sure that he was taking them regularly/appropriately.  He had only been taking the Flomax once daily but it is prescribed twice a day so this was increased about 2 weeks ago to twice daily dosing.  ROS: Per HPI  Allergies  Allergen Reactions  . Ivp Dye [Iodinated Diagnostic Agents] Swelling    Swelling   . Codeine Nausea Only    Nausea   . Sulfonamide Derivatives Hives and Itching    Hives itching    Past Medical History:  Diagnosis Date  . Aortic stenosis    St.Jude AVR in 1985; Echo 8/12: EF 50-55%, mild LVH, mech AV with mild AI, mean gradient 14 mmHg, mild to mod MR;  Echo 8/13: EF 60-65%, AVR ok with mean gradient 19 mmHg  . CAD (coronary artery disease)    Mild, nonobstructive at catheterization 5/01  . Carotid stenosis    Dopplers 8/13: RICA 83-15% and LICA 1-76% => followup 1 year  . Cataract   . CVA (cerebral infarction) 12/2010  . DVT (deep venous thrombosis) ([place])   . History of pulmonary embolism    post op DVT/PE;  s/p IVC filter  . History of rib fracture   . History of shingles   . Hyperlipidemia   . Hypothyroidism   . Pacemaker  Biotronik   . Personal history of subdural hematoma    Requiring craniotomy  . S/P cholecystectomy 2005  . S/P shoulder surgery    left  . Stroke ([place]) 2012  . Syncope    + carotid sinus massage     Current Outpatient Medications:  .  aspirin EC 81 MG tablet, Take 1 tablet (81 mg total) by mouth daily., Disp: , Rfl:  .  COUMADIN 3 MG tablet, TAKE 1 DAILY EXCEPT 1/2 TABLET ON Monday AND FRIDAY, Disp: 90 tablet, Rfl: 0 .  docusate sodium (COLACE) 100 MG capsule, Take 100 mg by mouth daily. , Disp: , Rfl:  .  ezetimibe (ZETIA) 10 MG tablet, Take 1 tablet (10 mg total) by mouth daily., Disp: 90 tablet, Rfl: 0 .  latanoprost (XALATAN) 0.005 % ophthalmic solution, INSTILL 1 DROP INTO RIGHT EYE AT BEDTIME. BEGIN NEW BOTTLE EVERY 42 DAYS, Disp: 2.5 mL, Rfl: 0 .  levothyroxine (SYNTHROID, LEVOTHROID) 75 MCG tablet, TAKE 1 TABLET EVERY DAY, Disp: 90 tablet, Rfl: 2 .  meclizine (ANTIVERT) 50 MG tablet, Take 1 tablet (50 mg total) by mouth 3 (three) times daily as needed., Disp: 30 tablet, Rfl: 0 .  metoprolol succinate (TOPROL-XL) 25 MG 24 hr tablet, TAKE 1 TABLET EVERY DAY, Disp: 90 tablet, Rfl: 0 .  Multiple Vitamin (MULTIVITAMIN) tablet, Take 1 tablet by mouth daily.  , Disp: , Rfl:  .  pantoprazole (PROTONIX) 40 MG tablet, TAKE 1 TABLET EVERY DAY, Disp: 90 tablet, Rfl: 0 .  simvastatin (ZOCOR) 40 MG  tablet, TAKE 1 TABLET AT BEDTIME, Disp: 90 tablet, Rfl: 0 .  tamsulosin (FLOMAX) 0.4 MG CAPS capsule, TAKE 1 CAPSULE (0.4 MG TOTAL) BY MOUTH 2 (TWO) TIMES DAILY., Disp: 180 capsule, Rfl: 0 .  tobramycin-dexamethasone (TOBRADEX) ophthalmic solution, Place 1 drop in eye every 2 hours x 2days, then 1 drop in eye every 4 hours x 5 days., Disp: 5 mL, Rfl: 0 Social History   Socioeconomic History  . Marital status: Married    Spouse name: Not on file  . Number of children: Not on file  . Years of education: Not on file  . Highest education level: Not on file  Social Needs  . Financial resource strain: Not on file  . Food insecurity - worry: Not on file  . Food insecurity - inability: Not on file  . Transportation needs - medical: Not on file  . Transportation needs - non-medical: Not on file   Occupational History  . Occupation: Retired    Fish farm manager: RETIRED  Tobacco Use  . Smoking status: Never Smoker  . Smokeless tobacco: Never Used  Substance and Sexual Activity  . Alcohol use: No  . Drug use: No  . Sexual activity: No  Other Topics Concern  . Not on file  Social History Narrative  . Not on file   Family History  Problem Relation Age of Onset  . Stroke Mother   . Cancer Father        Lip  . Heart disease Father        Three MI  . Heart attack Father   . Heart attack Brother   . Heart disease Brother   . Heart attack Brother   . Arthritis Sister   . Coronary artery disease Other     Objective: Office vital signs reviewed. BP (!) 90/58   Pulse (!) 55   Temp (!) 97.2 F (36.2 C) (Oral)   Ht '5\' 7"'$  (1.702 m)   Wt 143 lb (64.9 kg)   BMI 22.40 kg/m   Physical Examination:  General: Awake, alert, thin elderly male, No acute distress HEENT: MMM Cardio: bradycardic w/ coupled heart rhythm, S1S2 heard, no murmurs appreciated Pulm: clear to auscultation bilaterally, no wheezes, rhonchi or rales; normal work of breathing on room air MSK: Ambulates independently Extremities: 1-2+ pitting edema to bilateral shins.  Lower extremities are cool.  Multiple varicose veins. Neuro: Follows all commands.  Assessment/ Plan: 84 y.o. male   1. Leg swelling Possibly related to venous stasis versus cardiac etiology versus anemia (particularly in the setting of elevated INR) versus thyroid.  Will obtain labs to further evaluate.  In the interim, recommended elevation, compression and physical activity.  Patient is otherwise asymptomatic.  He demonstrates no other evidence of fluid overload.  Blood pressure is actually somewhat on the low side.  I did recommend that he is son discontinue twice daily dosing of Flomax.  Reduce to once daily.  There is a drug interaction between Coumadin and Flomax.  This may also be playing a part in decreased blood pressure.  Push oral fluids.   Patient to be seen within 1 week by PCP for INR recheck.  Will contact patient and son with results of labs once they are available.  Reasons for emergent evaluation in the emergency department were reviewed with the patient and his son, who voiced good understanding. - CMP14+EGFR - CBC with Differential - Brain natriuretic peptide - TSH  2. Long term current use of anticoagulant INR supratherapeutic at  3.2.  Goal is 2-2.5.  No active bleeding.  I suspect this is related to increased dose of Flomax as there is a drug interaction between Flomax and Coumadin.  Recommended reducing to once daily dosing.  Recheck in 1 week with PCP. - CoaguChek XS/INR Waived   Orders Placed This Encounter  Procedures  . CoaguChek XS/INR Waived  . CMP14+EGFR  . CBC with Differential  . Brain natriuretic peptide  . TSH    [name] [name], [place] [phone]

## 2017-12-03 NOTE — Patient Instructions (Signed)
There is an interaction between Flomax and Coumadin.  I would recommend reducing his Flomax dose back down to 1 time per day.  We will hold off on changing the Coumadin since he has been so stable on this regimen for so long.  I would like you to follow-up with Dr. Darlyn Read or the pharmacist next week for INR recheck.  You had labs performed today.  You will be contacted with the results of the labs once they are available, usually in the next 3 days for routine lab work.   Edema Edema is when you have too much fluid in your body or under your skin. Edema may make your legs, feet, and ankles swell up. Swelling is also common in looser tissues, like around your eyes. This is a common condition. It gets more common as you get older. There are many possible causes of edema. Eating too much salt (sodium) and being on your feet or sitting for a long time can cause edema in your legs, feet, and ankles. Hot weather may make edema worse. Edema is usually painless. Your skin may look swollen or shiny. Follow these instructions at home:  Keep the swollen body part raised (elevated) above the level of your heart when you are sitting or lying down.  Do not sit still or stand for a long time.  Do not wear tight clothes. Do not wear garters on your upper legs.  Exercise your legs. This can help the swelling go down.  Wear elastic bandages or support stockings as told by your doctor.  Eat a low-salt (low-sodium) diet to reduce fluid as told by your doctor.  Depending on the cause of your swelling, you may need to limit how much fluid you drink (fluid restriction).  Take over-the-counter and prescription medicines only as told by your doctor. Contact a doctor if:  Treatment is not working.  You have heart, liver, or kidney disease and have symptoms of edema.  You have sudden and unexplained weight gain. Get help right away if:  You have shortness of breath or chest pain.  You cannot breathe when  you lie down.  You have pain, redness, or warmth in the swollen areas.  You have heart, liver, or kidney disease and get edema all of a sudden.  You have a fever and your symptoms get worse all of a sudden. Summary  Edema is when you have too much fluid in your body or under your skin.  Edema may make your legs, feet, and ankles swell up. Swelling is also common in looser tissues, like around your eyes.  Raise (elevate) the swollen body part above the level of your heart when you are sitting or lying down.  Follow your doctor's instructions about diet and how much fluid you can drink (fluid restriction). This information is not intended to replace advice given to you by your health care provider. Make sure you discuss any questions you have with your health care provider. Document Released: 04/17/2008 Document Revised: 11/17/2016 Document Reviewed: 11/17/2016 Elsevier Interactive Patient Education  2017 [employer].

## 2017-12-04 LAB — CMP14+EGFR
ALT: 8 IU/L (ref 0–44)
AST: 15 IU/L (ref 0–40)
Albumin/Globulin Ratio: 1.3 (ref 1.2–2.2)
Albumin: 3.9 g/dL (ref 3.5–4.7)
Alkaline Phosphatase: 66 IU/L (ref 39–117)
BILIRUBIN TOTAL: 0.6 mg/dL (ref 0.0–1.2)
BUN/Creatinine Ratio: 10 (ref 10–24)
BUN: 13 mg/dL (ref 8–27)
CALCIUM: 8.7 mg/dL (ref 8.6–10.2)
CHLORIDE: 108 mmol/L — AB (ref 96–106)
CO2: 26 mmol/L (ref 20–29)
CREATININE: 1.36 mg/dL — AB (ref 0.76–1.27)
GFR, EST AFRICAN AMERICAN: 54 mL/min/{1.73_m2} — AB (ref >59)
GFR, EST NON AFRICAN AMERICAN: 46 mL/min/{1.73_m2} — AB (ref >59)
GLUCOSE: 94 mg/dL (ref 65–99)
Globulin, Total: 2.9 g/dL (ref 1.5–4.5)
Potassium: 4.5 mmol/L (ref 3.5–5.2)
Sodium: 145 mmol/L — ABNORMAL HIGH (ref 134–144)
TOTAL PROTEIN: 6.8 g/dL (ref 6.0–8.5)

## 2017-12-04 LAB — BRAIN NATRIURETIC PEPTIDE: BNP: 499.7 pg/mL — ABNORMAL HIGH (ref 0.0–100.0)

## 2017-12-04 LAB — CBC WITH DIFFERENTIAL/PLATELET
BASOS ABS: 0 10*3/uL (ref 0.0–0.2)
Basos: 0 %
EOS (ABSOLUTE): 0.1 10*3/uL (ref 0.0–0.4)
EOS: 4 %
HEMOGLOBIN: 11 g/dL — AB (ref 13.0–17.7)
Hematocrit: 33.2 % — ABNORMAL LOW (ref 37.5–51.0)
IMMATURE GRANS (ABS): 0 10*3/uL (ref 0.0–0.1)
Immature Granulocytes: 0 %
LYMPHS: 25 %
Lymphocytes Absolute: 0.8 10*3/uL (ref 0.7–3.1)
MCH: 32 pg (ref 26.6–33.0)
MCHC: 33.1 g/dL (ref 31.5–35.7)
MCV: 97 fL (ref 79–97)
MONOCYTES: 11 %
Monocytes Absolute: 0.4 10*3/uL (ref 0.1–0.9)
NEUTROS ABS: 1.9 10*3/uL (ref 1.4–7.0)
Neutrophils: 60 %
PLATELETS: 123 10*3/uL — AB (ref 150–379)
RBC: 3.44 x10E6/uL — ABNORMAL LOW (ref 4.14–5.80)
RDW: 13.6 % (ref 12.3–15.4)
WBC: 3.2 10*3/uL — ABNORMAL LOW (ref 3.4–10.8)

## 2017-12-05 ENCOUNTER — Telehealth: Payer: Self-pay | Admitting: Family Medicine

## 2017-12-05 LAB — SPECIMEN STATUS REPORT

## 2017-12-05 LAB — TSH: TSH: 0.242 u[IU]/mL — ABNORMAL LOW (ref 0.450–4.500)

## 2017-12-06 ENCOUNTER — Ambulatory Visit (INDEPENDENT_AMBULATORY_CARE_PROVIDER_SITE_OTHER): Payer: Medicare HMO | Admitting: Family Medicine

## 2017-12-06 ENCOUNTER — Encounter: Payer: Self-pay | Admitting: Family Medicine

## 2017-12-06 VITALS — BP 120/56 | HR 48 | Ht 67.0 in | Wt 141.0 lb

## 2017-12-06 DIAGNOSIS — R001 Bradycardia, unspecified: Secondary | ICD-10-CM

## 2017-12-06 DIAGNOSIS — Z7901 Long term (current) use of anticoagulants: Secondary | ICD-10-CM

## 2017-12-06 DIAGNOSIS — R002 Palpitations: Secondary | ICD-10-CM | POA: Diagnosis not present

## 2017-12-06 LAB — COAGUCHEK XS/INR WAIVED
INR: 2.9 — AB (ref 0.9–1.1)
PROTHROMBIN TIME: 35 s

## 2017-12-06 MED ORDER — METOPROLOL SUCCINATE ER 25 MG PO TB24: 12.5000 mg | ORAL_TABLET | Freq: Every day | ORAL | 0 refills | Status: DC

## 2017-12-06 NOTE — Progress Notes (Signed)
Subjective:  Patient ID: [name], male    DOB: 05-Dec-1927  Age: 84 y.o. MRN: [id number]  CC: Follow-up (pt here today following up with leg swelling and abnormal labs from visit with Dr Darnell Level on 12/03/17)   HPI [name] presents for concerns about his INR being elevated and some evidence for volume overload noted on blood work.  Earlier in the week he had been swollen in the feet.  That seems to have resolved.  He denies any shortness of breath orthopnea and dizziness.  He has noted some palpitations earlier in the week but none in the last 2-3 days.  Additionally he cut back on his tamsulosin to 1/day and has not noticed any change in his urine flow but his blood pressure is a bit higher today.  Additionally cutting back on the tamsulosin can lower his INR so that is to be checked today. Depression screen [facility] 2/9 12/06/2017 12/03/2017 09/07/2017  Decreased Interest 0 0 0  Down, Depressed, Hopeless 0 0 0  PHQ - 2 Score 0 0 0  Altered sleeping - - -  Tired, decreased energy - - -  Change in appetite - - -  Feeling bad or failure about yourself  - - -  Trouble concentrating - - -  Moving slowly or fidgety/restless - - -  Suicidal thoughts - - -  PHQ-9 Score - - -    History Geordan has a past medical history of Aortic stenosis, CAD (coronary artery disease), Carotid stenosis, Cataract, CVA (cerebral infarction) (12/2010), DVT (deep venous thrombosis) ([place]), History of pulmonary embolism, History of rib fracture, History of shingles, Hyperlipidemia, Hypothyroidism, Pacemaker  Biotronik, Personal history of subdural hematoma, S/P cholecystectomy (2005), S/P shoulder surgery, Stroke ([place]) (2012), and Syncope.   He has a past surgical history that includes Aortic valve replacement (1985); Cardiac catheterization; Vascular surgery; Brain surgery; permanent pacemaker insertion (N/A, 08/01/2012); and Eye surgery.   His family history includes Arthritis in his sister; Cancer in his  father; Coronary artery disease in his other; Heart attack in his brother, brother, and father; Heart disease in his brother and father; Stroke in his mother.He reports that  has never smoked. he has never used smokeless tobacco. He reports that he does not drink alcohol or use drugs.    ROS Review of Systems  Constitutional: Negative for chills, diaphoresis and fever.  HENT: Negative for rhinorrhea and sore throat.   Respiratory: Negative for cough and shortness of breath.   Cardiovascular: Positive for palpitations (Reports episode of a heavy hard heartbeat night before last). Negative for chest pain and leg swelling (Although he denies swelling today it was there earlier this week.).  Gastrointestinal: Negative for abdominal pain.  Musculoskeletal: Negative for arthralgias and myalgias.  Skin: Negative for rash.  Neurological: Positive for weakness (His weakness is generalized nonfocal and chronic). Negative for headaches.    Objective:  BP (!) 120/56   Pulse (!) 48   Ht '5\' 7"'$  (1.702 m)   Wt 141 lb (64 kg)   BMI 22.08 kg/m   BP Readings from Last 3 Encounters:  12/06/17 (!) 120/56  12/03/17 (!) 90/58  09/07/17 110/64    Wt Readings from Last 3 Encounters:  12/06/17 141 lb (64 kg)  12/03/17 143 lb (64.9 kg)  09/07/17 141 lb (64 kg)     Physical Exam  Constitutional: He is oriented to person, place, and time. He appears well-developed. No distress.  Elderly and thin in no acute  distress  HENT:  Head: Normocephalic and atraumatic.  Right Ear: External ear normal.  Left Ear: External ear normal.  Nose: Nose normal.  Mouth/Throat: Oropharynx is clear and moist.  Eyes: Conjunctivae and EOM are normal. Pupils are equal, round, and reactive to light.  Neck: Normal range of motion. Neck supple. No thyromegaly present.  Cardiovascular: Normal rate.  Murmur (There is a 2/6 murmur and a split S1 noted along with bradycardia of 46 by my count.  There is typical irregularity for  atrial fib with a controlled albeit slow rate) heard. Pulmonary/Chest: Effort normal and breath sounds normal. No respiratory distress. He has no wheezes. He has no rales. He exhibits no tenderness.  Abdominal: Soft. Bowel sounds are normal. He exhibits no distension. There is no tenderness.  Musculoskeletal: Normal range of motion. He exhibits no edema or tenderness.  Lymphadenopathy:    He has no cervical adenopathy.  Neurological: He is alert and oriented to person, place, and time. He has normal reflexes. No cranial nerve deficit. He exhibits abnormal muscle tone (Generalized muscle atrophy likely related to age).  Skin: Skin is warm and dry. He is not diaphoretic.  Psychiatric: He has a normal mood and affect. His behavior is normal. Judgment and thought content normal.      Assessment & Plan:   Ismeal was seen today for follow-up.  Diagnoses and all orders for this visit:  Long term current use of anticoagulant -     CoaguChek XS/INR Waived  Bradycardia -     BMP8+EGFR -     Brain natriuretic peptide -     T4, Free  Other orders -     metoprolol succinate (TOPROL-XL) 25 MG 24 hr tablet; Take 0.5 tablets (12.5 mg total) by mouth daily.       I have changed Arjan A. Dunsmore's metoprolol succinate. I am also having him maintain his multivitamin, docusate sodium, aspirin EC, meclizine, levothyroxine, latanoprost, pantoprazole, simvastatin, ezetimibe, COUMADIN, and tamsulosin.  Allergies as of 12/06/2017      Reactions   Ivp Dye [iodinated Diagnostic Agents] Swelling   Swelling   Codeine Nausea Only   Nausea   Sulfonamide Derivatives Hives, Itching   Hives itching      Medication List        Accurate as of 12/06/17  4:20 PM. Always use your most recent med list.          aspirin EC 81 MG tablet Take 1 tablet (81 mg total) by mouth daily.   COUMADIN 3 MG tablet Generic drug:  warfarin Take as directed by the anticoagulation clinic. If you are unsure how to  take this medication, talk to your nurse or doctor. Original instructions:  TAKE 1 DAILY EXCEPT 1/2 TABLET ON Monday AND FRIDAY   docusate sodium 100 MG capsule Commonly known as:  COLACE Take 100 mg by mouth daily.   ezetimibe 10 MG tablet Commonly known as:  ZETIA Take 1 tablet (10 mg total) by mouth daily.   latanoprost 0.005 % ophthalmic solution Commonly known as:  XALATAN INSTILL 1 DROP INTO RIGHT EYE AT BEDTIME. BEGIN NEW BOTTLE EVERY 42 DAYS   levothyroxine 75 MCG tablet Commonly known as:  SYNTHROID, LEVOTHROID TAKE 1 TABLET EVERY DAY   meclizine 50 MG tablet Commonly known as:  ANTIVERT Take 1 tablet (50 mg total) by mouth 3 (three) times daily as needed.   metoprolol succinate 25 MG 24 hr tablet Commonly known as:  TOPROL-XL Take 0.5 tablets (12.5  mg total) by mouth daily.   multivitamin tablet Take 1 tablet by mouth daily.   pantoprazole 40 MG tablet Commonly known as:  PROTONIX TAKE 1 TABLET EVERY DAY   simvastatin 40 MG tablet Commonly known as:  ZOCOR TAKE 1 TABLET AT BEDTIME   tamsulosin 0.4 MG Caps capsule Commonly known as:  FLOMAX Take 1 capsule (0.4 mg total) by mouth daily after breakfast.        Follow-up: Return in about 1 week (around 12/13/2017).  Claretta Fraise, M.D.

## 2017-12-06 NOTE — Telephone Encounter (Signed)
Patients son aware of results per DPR.

## 2017-12-06 NOTE — Patient Instructions (Signed)
Appt with Cardiologist Monday 12/10/17 at 2:30 on [employer]

## 2017-12-07 ENCOUNTER — Encounter: Payer: Self-pay | Admitting: Pharmacist Clinician (PhC)/ Clinical Pharmacy Specialist

## 2017-12-07 LAB — BMP8+EGFR
BUN/Creatinine Ratio: 12 (ref 10–24)
BUN: 16 mg/dL (ref 8–27)
CALCIUM: 8.7 mg/dL (ref 8.6–10.2)
CO2: 24 mmol/L (ref 20–29)
CREATININE: 1.39 mg/dL — AB (ref 0.76–1.27)
Chloride: 108 mmol/L — ABNORMAL HIGH (ref 96–106)
GFR, EST AFRICAN AMERICAN: 52 mL/min/{1.73_m2} — AB (ref >59)
GFR, EST NON AFRICAN AMERICAN: 45 mL/min/{1.73_m2} — AB (ref >59)
Glucose: 118 mg/dL — ABNORMAL HIGH (ref 65–99)
POTASSIUM: 4.5 mmol/L (ref 3.5–5.2)
Sodium: 146 mmol/L — ABNORMAL HIGH (ref 134–144)

## 2017-12-07 LAB — T4, FREE: FREE T4: 1.72 ng/dL (ref 0.82–1.77)

## 2017-12-07 LAB — BRAIN NATRIURETIC PEPTIDE: BNP: 405.5 pg/mL — ABNORMAL HIGH (ref 0.0–100.0)

## 2017-12-10 ENCOUNTER — Encounter: Payer: Self-pay | Admitting: Internal Medicine

## 2017-12-10 ENCOUNTER — Ambulatory Visit: Payer: Medicare HMO | Admitting: Internal Medicine

## 2017-12-10 DIAGNOSIS — I495 Sick sinus syndrome: Secondary | ICD-10-CM

## 2017-12-10 DIAGNOSIS — I480 Paroxysmal atrial fibrillation: Secondary | ICD-10-CM

## 2017-12-10 NOTE — Patient Instructions (Addendum)
Medication Instructions:  Your physician recommends that you continue on your current medications as directed. Please refer to the Current Medication list given to you today.  Labwork: None ordered.  Testing/Procedures:  Your physician has requested that you have an echocardiogram. Echocardiography is a painless test that uses sound waves to create images of your heart. It provides your doctor with information about the size and shape of your heart and how well your heart's chambers and valves are working. This procedure takes approximately one hour. There are no restrictions for this procedure.  Please schedule an Echo   Follow-Up: Your physician wants you to follow-up in: 6 weeks with Mack Guise, PA. Please call our office to schedule the follow-up appointment.   Any Other Special Instructions Will Be Listed Below (If Applicable).     If you need a refill on your cardiac medications before your next appointment, please call your pharmacy.

## 2017-12-10 NOTE — Progress Notes (Signed)
Patient Care Team: [name], MD as PCP - General (Family Medicine) Ernesto Rutherford, MD as Consulting Physician (Ophthalmology) Duke Salvia, MD as Consulting Physician (Cardiology)   HPI  [name] is a 84 y.o. male Seen in follow-up for carotid hypersensitivity and syncope. He is status post Biotronik CLS pacemaker implantation.  He has a history of mechanical aortic valve he takes aspirin and Coumadin. He has a history of a craniotomy following a subdural hematoma.  Echocardiogram 11/17 normal left ventricular function normal valve function  Date Cr Hgb  1/18 1.36 11.7  1/19 1.39 11.0   He refers back 2/2 to bradycardia as has been seen twice in the last 10 days by his PCP.  On the first encounter his blood pressure was 90/58 and his alpha blocker and metoprolol were decreased.  Pulse was noted to be 48.  Those notes were also reviewed and reported peripheral edema.  This is significantly improved.  He had noted no change in functional status.  He denies chest pain.  He does not have orthopnea.  He has chronic stable exertional dyspnea    Records and Results Reviewed   Past Medical History:  Diagnosis Date  . Aortic stenosis    St.Jude AVR in 1985; Echo 8/12: EF 50-55%, mild LVH, mech AV with mild AI, mean gradient 14 mmHg, mild to mod MR;  Echo 8/13: EF 60-65%, AVR ok with mean gradient 19 mmHg  . CAD (coronary artery disease)    Mild, nonobstructive at catheterization 5/01  . Carotid stenosis    Dopplers 8/13: RICA 40-50% and LICA 0-39% => followup 1 year  . Cataract   . CVA (cerebral infarction) 12/2010  . DVT (deep venous thrombosis) ([facility])   . History of pulmonary embolism    post op DVT/PE;  s/p IVC filter  . History of rib fracture   . History of shingles   . Hyperlipidemia   . Hypothyroidism   . Pacemaker  Biotronik   . Personal history of subdural hematoma    Requiring craniotomy  . S/P cholecystectomy 2005  . S/P shoulder surgery    left  . Stroke ([facility]) 2012  . Syncope    + carotid sinus massage    Past Surgical History:  Procedure Laterality Date  . AORTIC VALVE REPLACEMENT  1985   St. Jude / mechanical  . BRAIN SURGERY    . CARDIAC CATHETERIZATION    . EYE SURGERY     bilateral cataract  . PERMANENT PACEMAKER INSERTION N/A 08/01/2012   Procedure: PERMANENT PACEMAKER INSERTION;  Surgeon: Duke Salvia, MD;  Location: [facility] CATH LAB;  Service: Cardiovascular;  Laterality: N/A;  . VASCULAR SURGERY      Current Outpatient Medications  Medication Sig Dispense Refill  . aspirin EC 81 MG tablet Take 1 tablet (81 mg total) by mouth daily.    Marland Kitchen COUMADIN 3 MG tablet TAKE 1 DAILY EXCEPT 1/2 TABLET ON Monday AND FRIDAY 90 tablet 0  . docusate sodium (COLACE) 100 MG capsule Take 100 mg by mouth daily.     Marland Kitchen ezetimibe (ZETIA) 10 MG tablet Take 1 tablet (10 mg total) by mouth daily. 90 tablet 0  . latanoprost (XALATAN) 0.005 % ophthalmic solution INSTILL 1 DROP INTO RIGHT EYE AT BEDTIME. BEGIN NEW BOTTLE EVERY 42 DAYS 2.5 mL 0  . levothyroxine (SYNTHROID, LEVOTHROID) 75 MCG tablet TAKE 1 TABLET EVERY DAY 90 tablet 2  . meclizine (ANTIVERT) 50 MG tablet Take 1  tablet (50 mg total) by mouth 3 (three) times daily as needed. 30 tablet 0  . metoprolol succinate (TOPROL-XL) 25 MG 24 hr tablet Take 0.5 tablets (12.5 mg total) by mouth daily. 90 tablet 0  . Multiple Vitamin (MULTIVITAMIN) tablet Take 1 tablet by mouth daily.      . pantoprazole (PROTONIX) 40 MG tablet TAKE 1 TABLET EVERY DAY 90 tablet 0  . simvastatin (ZOCOR) 40 MG tablet TAKE 1 TABLET AT BEDTIME 90 tablet 0  . tamsulosin (FLOMAX) 0.4 MG CAPS capsule Take 1 capsule (0.4 mg total) by mouth daily after breakfast. 180 capsule 0   No current facility-administered medications for this visit.     Allergies  Allergen Reactions  . Ivp Dye [Iodinated Diagnostic Agents] Swelling    Swelling   . Codeine Nausea Only    Nausea   . Sulfonamide Derivatives Hives and  Itching    Hives itching       Review of Systems negative except from HPI and PMH  Physical Exam BP (!) 122/44   Pulse 69   Ht 5\' 8"  (1.727 m)   Wt 149 lb 3.2 oz (67.7 kg)   BMI 22.69 kg/m   Well developed and nourished in no acute distress HENT normal Neck supple with JVP-6-7 cm Clear Regular rate and rhythm, mechanical S2 with a 2/6 systolic murmur in a pattern of trigeminy Abd-soft with active BS No Clubbing cyanosis 1+ edema Skin-warm and dry A & Oriented  Grossly normal sensory and motor function    ECG atrial pacing at 69 Intervals 20/10/40 Bigeminal PVCs with a left bundle inferior axis morphology with a QS in lead aVR and an RS in lead aVL   Assessment and  Plan  Carotid sinus hypersensitivity   Sinus node dysfunction/chronotropic incompetence  Pacemaker-Biotronik  Hypertension  Heart failure acute-on chronic presumed diastolic  PVCs  Atrial fibrillation  Aortic valve replacement-mechanical    He has had no interval atrial fibrillation.  He remains on Coumadin and aspirin for his mechanical aortic valve; there are no significant bleeding issues and hemoglobin is relatively stable as noted above  His heart failure is better with decreased edema.  I am not quite sure why it got better but I thought rather than any further interventions at this time we would be following along in conjunction with his PCP who is seen again tomorrow.  His PVC burden has increased freq significantly from 6--16% over the last 6 months.  This could be contributing to her cardiomyopathy and the heart failure that was noted we will check an echocardiogram   We will plan to have him see RU-PA in follow-up in about 6 weeks for assessment of heart failure.  We will also need to review the PVC burden and to decide whether further intervention is necessary.  This could include either atrial overdrive pacing possibly to a rate of 80 or antiarrhythmic drug.  Given hypotension I  do not think beta-blockers or calcium blockers will be useful.  I would probably try overdrive pacing first.       Current medicines are reviewed at length with the patient today .  The patient does not  have concerns regarding medicines.

## 2017-12-11 ENCOUNTER — Encounter: Payer: Self-pay | Admitting: Family Medicine

## 2017-12-11 ENCOUNTER — Ambulatory Visit (INDEPENDENT_AMBULATORY_CARE_PROVIDER_SITE_OTHER): Payer: Medicare HMO | Admitting: Family Medicine

## 2017-12-11 ENCOUNTER — Telehealth: Payer: Self-pay | Admitting: Family Medicine

## 2017-12-11 VITALS — BP 126/54 | Ht 68.0 in | Wt 139.0 lb

## 2017-12-11 DIAGNOSIS — I495 Sick sinus syndrome: Secondary | ICD-10-CM | POA: Diagnosis not present

## 2017-12-11 DIAGNOSIS — R0600 Dyspnea, unspecified: Secondary | ICD-10-CM | POA: Diagnosis not present

## 2017-12-11 LAB — CUP PACEART INCLINIC DEVICE CHECK
Brady Statistic RA Percent Paced: 39 %
Brady Statistic RV Percent Paced: 16 %
Implantable Lead Implant Date: 20130919
Implantable Lead Location: 753860
Implantable Lead Model: 5076
Lead Channel Impedance Value: 409 Ohm
Lead Channel Impedance Value: 468 Ohm
Lead Channel Pacing Threshold Amplitude: 0.7 V
Lead Channel Pacing Threshold Amplitude: 0.8 V
Lead Channel Pacing Threshold Pulse Width: 0.4 ms
Lead Channel Pacing Threshold Pulse Width: 0.4 ms
Lead Channel Sensing Intrinsic Amplitude: 3 mV
Lead Channel Sensing Intrinsic Amplitude: 3 mV
Lead Channel Setting Pacing Amplitude: 1.8 V
MDC IDC LEAD IMPLANT DT: 20130919
MDC IDC LEAD LOCATION: 753859
MDC IDC MSMT LEADCHNL RA PACING THRESHOLD AMPLITUDE: 0.7 V
MDC IDC MSMT LEADCHNL RA PACING THRESHOLD PULSEWIDTH: 0.4 ms
MDC IDC MSMT LEADCHNL RV PACING THRESHOLD AMPLITUDE: 0.8 V
MDC IDC MSMT LEADCHNL RV PACING THRESHOLD PULSEWIDTH: 0.4 ms
MDC IDC MSMT LEADCHNL RV SENSING INTR AMPL: 14.7 mV
MDC IDC PG IMPLANT DT: 20130919
MDC IDC SESS DTM: 20190128195900
MDC IDC SET LEADCHNL RV PACING AMPLITUDE: 2 V
MDC IDC SET LEADCHNL RV PACING PULSEWIDTH: 0.4 ms
Pulse Gen Serial Number: 66244569

## 2017-12-11 MED ORDER — FUROSEMIDE 20 MG PO TABS: 20.0000 mg | ORAL_TABLET | Freq: Every day | ORAL | 3 refills | Status: DC

## 2017-12-11 MED ORDER — POTASSIUM CHLORIDE CRYS ER 10 MEQ PO TBCR: 10.0000 meq | EXTENDED_RELEASE_TABLET | ORAL | 0 refills | Status: DC

## 2017-12-11 NOTE — Progress Notes (Signed)
Subjective:  Patient ID: [name], male    DOB: Oct 24, 1928  Age: 84 y.o. MRN: [id number]  CC: Follow-up (pt here today following up after being seen yesterday with Cardiologist and has Echo scheduled on 12/13/17)   HPI [name] presents for continued concern for bradycardia.  He is having some swelling but less than last week.  Cardiology signed off on decreasing the beta-blocker but did not feel it would make a significant difference for him.  Echocardiogram was ordered by cardiology to be performed  in 2 days.  Patient continues to have some dyspnea as well as mild edema but no chest pain accompanies the bradycardia.  He does have an indwelling pacemaker.  Depression screen [facility] 2/9 12/11/2017 12/06/2017 12/03/2017  Decreased Interest 0 0 0  Down, Depressed, Hopeless 0 0 0  PHQ - 2 Score 0 0 0  Altered sleeping - - -  Tired, decreased energy - - -  Change in appetite - - -  Feeling bad or failure about yourself  - - -  Trouble concentrating - - -  Moving slowly or fidgety/restless - - -  Suicidal thoughts - - -  PHQ-9 Score - - -    History [name] has a past medical history of Aortic stenosis, CAD (coronary artery disease), Carotid stenosis, Cataract, CVA (cerebral infarction) (12/2010), DVT (deep venous thrombosis) ([place]), History of pulmonary embolism, History of rib fracture, History of shingles, Hyperlipidemia, Hypothyroidism, Pacemaker  Biotronik, Personal history of subdural hematoma, S/P cholecystectomy (2005), S/P shoulder surgery, Stroke ([place]) (2012), and Syncope.   He has a past surgical history that includes Aortic valve replacement (1985); Cardiac catheterization; Vascular surgery; Brain surgery; permanent pacemaker insertion (N/A, 08/01/2012); and Eye surgery.   His family history includes Arthritis in his sister; Cancer in his father; Coronary artery disease in his other; Heart attack in his brother, brother, and father; Heart disease in his brother and  father; Stroke in his mother.He reports that  has never smoked. he has never used smokeless tobacco. He reports that he does not drink alcohol or use drugs.    ROS Review of Systems  Constitutional: Negative for chills, diaphoresis, fever and unexpected weight change.  HENT: Negative for congestion, hearing loss, rhinorrhea and sore throat.   Eyes: Negative for visual disturbance.  Respiratory: Positive for shortness of breath. Negative for cough.   Cardiovascular: Positive for leg swelling. Negative for chest pain and palpitations.  Gastrointestinal: Negative for abdominal pain, constipation and diarrhea.  Genitourinary: Negative for dysuria and flank pain.  Musculoskeletal: Negative for arthralgias and joint swelling.  Skin: Negative for rash.  Neurological: Positive for dizziness. Negative for headaches.  Psychiatric/Behavioral: Negative for dysphoric mood and sleep disturbance.    Objective:  BP (!) 126/54   Ht _0  (1.727 m)   Wt 139 lb (63 kg)   BMI 21.13 kg/m   BP Readings from Last 3 Encounters:  12/11/17 (!) 126/54  12/10/17 (!) 122/44  12/06/17 (!) 120/56    Wt Readings from Last 3 Encounters:  12/11/17 139 lb (63 kg)  12/10/17 149 lb 3.2 oz (67.7 kg)  12/06/17 141 lb (64 kg)     Physical Exam  Constitutional: He is oriented to person, place, and time. He appears well-developed and well-nourished. No distress.  HENT:  Head: Normocephalic and atraumatic.  Right Ear: External ear normal.  Left Ear: External ear normal.  Nose: Nose normal.  Mouth/Throat: Oropharynx is clear and moist.  Eyes: Conjunctivae and EOM  are normal. Pupils are equal, round, and reactive to light.  Neck: Normal range of motion. Neck supple. No thyromegaly present.  Cardiovascular: Normal rate, regular rhythm and normal heart sounds.  No murmur heard. Pulmonary/Chest: Effort normal and breath sounds normal. No respiratory distress. He has no wheezes. He has no rales.  Abdominal: Soft.  Bowel sounds are normal. He exhibits no distension. There is no tenderness.  Musculoskeletal: Normal range of motion. He exhibits edema (2+ pretibial).  Lymphadenopathy:    He has no cervical adenopathy.  Neurological: He is alert and oriented to person, place, and time. He has normal reflexes.  Skin: Skin is warm and dry.  Psychiatric: He has a normal mood and affect. His behavior is normal. Judgment and thought content normal.      Assessment & Plan:   [name] was seen today for follow-up.  Diagnoses and all orders for this visit:  Sinoatrial node dysfunction ([place]) -     BMP8+EGFR -     Brain natriuretic peptide  Other orders -     furosemide (LASIX) 20 MG tablet; Take 1 tablet (20 mg total) by mouth daily. -     potassium chloride (K-DUR,KLOR-CON) 10 MEQ tablet; Take 1 tablet (10 mEq total) by mouth every morning.       I have discontinued Mikeal Hawthorne A. Cancro's metoprolol succinate. I am also having him start on furosemide and potassium chloride. Additionally, I am having him maintain his multivitamin, docusate sodium, aspirin EC, meclizine, levothyroxine, latanoprost, pantoprazole, simvastatin, ezetimibe, COUMADIN, and tamsulosin.  Allergies as of 12/11/2017      Reactions   Ivp Dye [iodinated Diagnostic Agents] Swelling   Swelling   Codeine Nausea Only   Nausea   Sulfonamide Derivatives Hives, Itching   Hives itching      Medication List        Accurate as of 12/11/17  9:19 PM. Always use your most recent med list.          aspirin EC 81 MG tablet Take 1 tablet (81 mg total) by mouth daily.   COUMADIN 3 MG tablet Generic drug:  warfarin Take as directed by the anticoagulation clinic. If you are unsure how to take this medication, talk to your nurse or doctor. Original instructions:  TAKE 1 DAILY EXCEPT 1/2 TABLET ON Monday AND FRIDAY   docusate sodium 100 MG capsule Commonly known as:  COLACE Take 100 mg by mouth daily.   ezetimibe 10 MG  tablet Commonly known as:  ZETIA Take 1 tablet (10 mg total) by mouth daily.   furosemide 20 MG tablet Commonly known as:  LASIX Take 1 tablet (20 mg total) by mouth daily.   latanoprost 0.005 % ophthalmic solution Commonly known as:  XALATAN INSTILL 1 DROP INTO RIGHT EYE AT BEDTIME. BEGIN NEW BOTTLE EVERY 42 DAYS   levothyroxine 75 MCG tablet Commonly known as:  SYNTHROID, LEVOTHROID TAKE 1 TABLET EVERY DAY   meclizine 50 MG tablet Commonly known as:  ANTIVERT Take 1 tablet (50 mg total) by mouth 3 (three) times daily as needed.   multivitamin tablet Take 1 tablet by mouth daily.   pantoprazole 40 MG tablet Commonly known as:  PROTONIX TAKE 1 TABLET EVERY DAY   potassium chloride 10 MEQ tablet Commonly known as:  K-DUR,KLOR-CON Take 1 tablet (10 mEq total) by mouth every morning.   simvastatin 40 MG tablet Commonly known as:  ZOCOR TAKE 1 TABLET AT BEDTIME   tamsulosin 0.4 MG Caps capsule Commonly known as:  FLOMAX Take 1 capsule (0.4 mg total) by mouth daily after breakfast.        Follow-up: Return in about 1 month (around 01/10/2018).  Claretta Fraise, M.D.

## 2017-12-12 ENCOUNTER — Ambulatory Visit: Payer: Medicare HMO | Admitting: Family Medicine

## 2017-12-12 LAB — BMP8+EGFR
BUN/Creatinine Ratio: 11 (ref 10–24)
BUN: 14 mg/dL (ref 8–27)
CO2: 23 mmol/L (ref 20–29)
Calcium: 9 mg/dL (ref 8.6–10.2)
Chloride: 106 mmol/L (ref 96–106)
Creatinine, Ser: 1.31 mg/dL — ABNORMAL HIGH (ref 0.76–1.27)
GFR, EST AFRICAN AMERICAN: 56 mL/min/{1.73_m2} — AB (ref >59)
GFR, EST NON AFRICAN AMERICAN: 49 mL/min/{1.73_m2} — AB (ref >59)
Glucose: 94 mg/dL (ref 65–99)
POTASSIUM: 4.7 mmol/L (ref 3.5–5.2)
Sodium: 143 mmol/L (ref 134–144)

## 2017-12-12 LAB — BRAIN NATRIURETIC PEPTIDE: BNP: 290.2 pg/mL — ABNORMAL HIGH (ref 0.0–100.0)

## 2017-12-13 ENCOUNTER — Other Ambulatory Visit: Payer: Self-pay

## 2017-12-13 ENCOUNTER — Ambulatory Visit (HOSPITAL_COMMUNITY): Payer: Medicare HMO | Attending: Cardiology

## 2017-12-13 DIAGNOSIS — I495 Sick sinus syndrome: Secondary | ICD-10-CM | POA: Diagnosis not present

## 2017-12-13 DIAGNOSIS — I2510 Atherosclerotic heart disease of native coronary artery without angina pectoris: Secondary | ICD-10-CM | POA: Diagnosis not present

## 2017-12-13 DIAGNOSIS — I082 Rheumatic disorders of both aortic and tricuspid valves: Secondary | ICD-10-CM | POA: Insufficient documentation

## 2017-12-13 DIAGNOSIS — Z86711 Personal history of pulmonary embolism: Secondary | ICD-10-CM | POA: Insufficient documentation

## 2017-12-13 DIAGNOSIS — E785 Hyperlipidemia, unspecified: Secondary | ICD-10-CM | POA: Diagnosis not present

## 2017-12-13 DIAGNOSIS — I480 Paroxysmal atrial fibrillation: Secondary | ICD-10-CM | POA: Insufficient documentation

## 2017-12-19 ENCOUNTER — Other Ambulatory Visit: Payer: Self-pay | Admitting: Family Medicine

## 2017-12-26 ENCOUNTER — Encounter: Payer: Self-pay | Admitting: Family Medicine

## 2017-12-26 ENCOUNTER — Ambulatory Visit (INDEPENDENT_AMBULATORY_CARE_PROVIDER_SITE_OTHER): Payer: Medicare HMO | Admitting: Family Medicine

## 2017-12-26 VITALS — BP 118/64 | HR 57 | Ht 68.0 in | Wt 139.0 lb

## 2017-12-26 DIAGNOSIS — N183 Chronic kidney disease, stage 3 (moderate): Secondary | ICD-10-CM

## 2017-12-26 DIAGNOSIS — R601 Generalized edema: Secondary | ICD-10-CM

## 2017-12-26 DIAGNOSIS — N1832 Chronic kidney disease, stage 3b: Secondary | ICD-10-CM

## 2017-12-26 MED ORDER — POTASSIUM CHLORIDE CRYS ER 10 MEQ PO TBCR: 10.0000 meq | EXTENDED_RELEASE_TABLET | ORAL | 2 refills | Status: DC

## 2017-12-26 NOTE — Progress Notes (Signed)
Subjective:  Patient ID: [name], male    DOB: 19-Sep-1928  Age: 84 y.o. MRN: [id number]  CC: Follow-up (pt here today for 2 week follow up after starting lasix and potassium)   HPI [name] presents for recheck of the swelling in his feet.  He has not been short of breath and the swelling has diminished.  He had a great deal of problem urinating excessively for the first few days but that has diminished as well.  Appetite is fair and he is taking fluids well.  Energy is adequate for age.  Depression screen [facility] 2/9 12/26/2017 12/11/2017 12/06/2017  Decreased Interest 0 0 0  Down, Depressed, Hopeless 0 0 0  PHQ - 2 Score 0 0 0  Altered sleeping - - -  Tired, decreased energy - - -  Change in appetite - - -  Feeling bad or failure about yourself  - - -  Trouble concentrating - - -  Moving slowly or fidgety/restless - - -  Suicidal thoughts - - -  PHQ-9 Score - - -    History [name] has a past medical history of Aortic stenosis, CAD (coronary artery disease), Carotid stenosis, Cataract, CVA (cerebral infarction) (12/2010), DVT (deep venous thrombosis) ([place]), History of pulmonary embolism, History of rib fracture, History of shingles, Hyperlipidemia, Hypothyroidism, Pacemaker  Biotronik, Personal history of subdural hematoma, S/P cholecystectomy (2005), S/P shoulder surgery, Stroke ([place]) (2012), and Syncope.   He has a past surgical history that includes Aortic valve replacement (1985); Cardiac catheterization; Vascular surgery; Brain surgery; permanent pacemaker insertion (N/A, 08/01/2012); and Eye surgery.   His family history includes Arthritis in his sister; Cancer in his father; Coronary artery disease in his other; Heart attack in his brother, brother, and father; Heart disease in his brother and father; Stroke in his mother.He reports that  has never smoked. he has never used smokeless tobacco. He reports that he does not drink alcohol or use drugs.    ROS Review  of Systems  Constitutional: Negative for chills, diaphoresis, fever and unexpected weight change.  HENT: Negative for congestion, hearing loss, rhinorrhea and sore throat.   Eyes: Negative for visual disturbance.  Respiratory: Negative for cough and shortness of breath.   Cardiovascular: Negative for chest pain.  Gastrointestinal: Negative for abdominal pain, constipation and diarrhea.  Genitourinary: Negative for dysuria and flank pain.  Musculoskeletal: Negative for arthralgias and joint swelling.  Skin: Negative for rash.  Neurological: Negative for dizziness and headaches.  Psychiatric/Behavioral: Negative for dysphoric mood and sleep disturbance.    Objective:  BP 118/64   Pulse (!) 57   Ht _0  (1.727 m)   Wt 139 lb (63 kg)   BMI 21.13 kg/m   BP Readings from Last 3 Encounters:  12/26/17 118/64  12/11/17 (!) 126/54  12/10/17 (!) 122/44    Wt Readings from Last 3 Encounters:  12/26/17 139 lb (63 kg)  12/11/17 139 lb (63 kg)  12/10/17 149 lb 3.2 oz (67.7 kg)     Physical Exam  Constitutional: He is oriented to person, place, and time. He appears well-developed and well-nourished. No distress.  HENT:  Head: Normocephalic and atraumatic.  Right Ear: External ear normal.  Left Ear: External ear normal.  Nose: Nose normal.  Mouth/Throat: Oropharynx is clear and moist.  Eyes: Conjunctivae and EOM are normal. Pupils are equal, round, and reactive to light.  Neck: Normal range of motion. Neck supple. No thyromegaly present.  Cardiovascular: Normal rate, regular  rhythm and normal heart sounds.  No murmur heard. Pulmonary/Chest: Effort normal and breath sounds normal. No respiratory distress. He has no wheezes. He has no rales.  Abdominal: Soft. Bowel sounds are normal. He exhibits no distension. There is no tenderness.  Lymphadenopathy:    He has no cervical adenopathy.  Neurological: He is alert and oriented to person, place, and time. He has normal reflexes.  Skin:  Skin is warm and dry.  Psychiatric: He has a normal mood and affect. His behavior is normal. Judgment and thought content normal.      Assessment & Plan:   [name] was seen today for follow-up.  Diagnoses and all orders for this visit:  Chronic kidney disease (CKD) stage G3b/A1, moderately decreased glomerular filtration rate (GFR) between 30-44 mL/min/1.73 square meter and albuminuria creatinine ratio less than 30 mg/g (HCC) -     CMP14+EGFR  Other orders -     potassium chloride (K-DUR,KLOR-CON) 10 MEQ tablet; Take 1 tablet (10 mEq total) by mouth every morning.       I have discontinued [name]'s meclizine. I am also having him maintain his multivitamin, docusate sodium, aspirin EC, levothyroxine, latanoprost, pantoprazole, simvastatin, COUMADIN, tamsulosin, furosemide, ezetimibe, and potassium chloride.  Allergies as of 12/26/2017      Reactions   Ivp Dye [iodinated Diagnostic Agents] Swelling   Swelling   Codeine Nausea Only   Nausea   Sulfonamide Derivatives Hives, Itching   Hives itching      Medication List        Accurate as of 12/26/17  1:07 PM. Always use your most recent med list.          aspirin EC 81 MG tablet Take 1 tablet (81 mg total) by mouth daily.   COUMADIN 3 MG tablet Generic drug:  warfarin Take as directed by the anticoagulation clinic. If you are unsure how to take this medication, talk to your nurse or doctor. Original instructions:  TAKE 1 DAILY EXCEPT 1/2 TABLET ON Monday AND FRIDAY   docusate sodium 100 MG capsule Commonly known as:  COLACE Take 100 mg by mouth daily.   ezetimibe 10 MG tablet Commonly known as:  ZETIA TAKE 1 TABLET BY MOUTH EVERY DAY   furosemide 20 MG tablet Commonly known as:  LASIX Take 1 tablet (20 mg total) by mouth daily.   latanoprost 0.005 % ophthalmic solution Commonly known as:  XALATAN INSTILL 1 DROP INTO RIGHT EYE AT BEDTIME. BEGIN NEW BOTTLE EVERY 42 DAYS   levothyroxine 75 MCG  tablet Commonly known as:  SYNTHROID, LEVOTHROID TAKE 1 TABLET EVERY DAY   multivitamin tablet Take 1 tablet by mouth daily.   pantoprazole 40 MG tablet Commonly known as:  PROTONIX TAKE 1 TABLET EVERY DAY   potassium chloride 10 MEQ tablet Commonly known as:  K-DUR,KLOR-CON Take 1 tablet (10 mEq total) by mouth every morning.   simvastatin 40 MG tablet Commonly known as:  ZOCOR TAKE 1 TABLET AT BEDTIME   tamsulosin 0.4 MG Caps capsule Commonly known as:  FLOMAX Take 1 capsule (0.4 mg total) by mouth daily after breakfast.        Follow-up: No Follow-up on file.  Claretta Fraise, M.D.

## 2017-12-27 LAB — CMP14+EGFR
ALBUMIN: 4.2 g/dL (ref 3.5–4.7)
ALT: 17 IU/L (ref 0–44)
AST: 18 IU/L (ref 0–40)
Albumin/Globulin Ratio: 1.3 (ref 1.2–2.2)
Alkaline Phosphatase: 68 IU/L (ref 39–117)
BILIRUBIN TOTAL: 0.6 mg/dL (ref 0.0–1.2)
BUN / CREAT RATIO: 12 (ref 10–24)
BUN: 18 mg/dL (ref 8–27)
CALCIUM: 9.1 mg/dL (ref 8.6–10.2)
CHLORIDE: 102 mmol/L (ref 96–106)
CO2: 27 mmol/L (ref 20–29)
CREATININE: 1.47 mg/dL — AB (ref 0.76–1.27)
GFR calc non Af Amer: 42 mL/min/{1.73_m2} — ABNORMAL LOW (ref >59)
GFR, EST AFRICAN AMERICAN: 49 mL/min/{1.73_m2} — AB (ref >59)
GLUCOSE: 98 mg/dL (ref 65–99)
Globulin, Total: 3.2 g/dL (ref 1.5–4.5)
Potassium: 4.4 mmol/L (ref 3.5–5.2)
Sodium: 142 mmol/L (ref 134–144)
TOTAL PROTEIN: 7.4 g/dL (ref 6.0–8.5)

## 2017-12-28 ENCOUNTER — Telehealth: Payer: Self-pay

## 2017-12-28 ENCOUNTER — Other Ambulatory Visit: Payer: Self-pay | Admitting: Family Medicine

## 2017-12-28 MED ORDER — DRONEDARONE HCL 400 MG PO TABS: 400.0000 mg | ORAL_TABLET | Freq: Two times a day (BID) | ORAL | 3 refills | Status: DC

## 2017-12-28 MED ORDER — DRONEDARONE HCL 400 MG PO TABS: 400.0000 mg | ORAL_TABLET | Freq: Two times a day (BID) | ORAL | 0 refills | Status: DC

## 2017-12-28 MED ORDER — FUROSEMIDE 20 MG PO TABS
20.0000 mg | ORAL_TABLET | ORAL | 3 refills | Status: DC
Start: 2017-12-28 — End: 2018-01-09

## 2017-12-28 NOTE — Telephone Encounter (Signed)
Pt's son returned my call and said they can get Multaq 400mg  for $25 for a 90 day supply. He requested I send in a 30 day supply to [facility] and a 90 day to his mail-in pharmacy. He has a follow up appointment with Francis Dowse January 23, 2018.

## 2017-12-28 NOTE — Telephone Encounter (Signed)
-----   Message from Duke Salvia, MD sent at 12/26/2017 10:27 AM EST ----- Please Inform Patient Echo showed significant change in heart muscle function   55-60>>>35-40  We have to presume that this is related to PVCs  Lets try mexilitene 150 bid/MULTAQ 400 bid if either is affordable  And if not amiodarone

## 2018-01-08 ENCOUNTER — Other Ambulatory Visit: Payer: Self-pay | Admitting: *Deleted

## 2018-01-08 DIAGNOSIS — N400 Enlarged prostate without lower urinary tract symptoms: Secondary | ICD-10-CM

## 2018-01-08 MED ORDER — LATANOPROST 0.005 % OP SOLN: OPHTHALMIC | 0 refills | Status: DC

## 2018-01-08 MED ORDER — TAMSULOSIN HCL 0.4 MG PO CAPS: 0.4000 mg | ORAL_CAPSULE | Freq: Every day | ORAL | 0 refills | Status: DC

## 2018-01-08 MED ORDER — POTASSIUM CHLORIDE CRYS ER 10 MEQ PO TBCR: 10.0000 meq | EXTENDED_RELEASE_TABLET | ORAL | 0 refills | Status: DC

## 2018-01-09 ENCOUNTER — Other Ambulatory Visit: Payer: Self-pay

## 2018-01-09 MED ORDER — FUROSEMIDE 20 MG PO TABS: 20.0000 mg | ORAL_TABLET | ORAL | 0 refills | Status: DC

## 2018-01-15 ENCOUNTER — Ambulatory Visit (INDEPENDENT_AMBULATORY_CARE_PROVIDER_SITE_OTHER): Payer: Medicare HMO | Admitting: Family Medicine

## 2018-01-15 ENCOUNTER — Encounter: Payer: Self-pay | Admitting: Family Medicine

## 2018-01-15 VITALS — BP 103/61 | HR 78 | Temp 97.2°F | Ht 68.0 in | Wt 144.4 lb

## 2018-01-15 DIAGNOSIS — I480 Paroxysmal atrial fibrillation: Secondary | ICD-10-CM

## 2018-01-15 DIAGNOSIS — Z952 Presence of prosthetic heart valve: Secondary | ICD-10-CM

## 2018-01-15 DIAGNOSIS — Z7901 Long term (current) use of anticoagulants: Secondary | ICD-10-CM | POA: Diagnosis not present

## 2018-01-15 LAB — POCT INR: INR: 3

## 2018-01-15 LAB — COAGUCHEK XS/INR WAIVED
INR: 3 — ABNORMAL HIGH (ref 0.9–1.1)
PROTHROMBIN TIME: 36.5 s

## 2018-01-15 NOTE — Addendum Note (Signed)
Addended by: Margurite Auerbach on: 01/15/2018 09:19 AM   Modules accepted: Orders

## 2018-01-15 NOTE — Progress Notes (Signed)
Subjective:  Patient ID: [name], male    DOB: 10/16/1928  Age: 84 y.o. MRN: [id number]  CC: Follow-up (pt here today for PROTIME)   HPI [name] presents for Patient in for follow-up of atrial fibrillation. Patient denies any recent bouts of chest pain.  Palpitations felt as hard but not rapid beats.  Denies having any recently.  The last momentarily only.. Additionally, patient is taking anticoagulants. Patient denies any recent excessive bleeding episodes including epistaxis, bleeding from the gums, genitalia, rectal bleeding or hematuria. Additionally there has been no excessive bruising.  Patient also has an artificial heart valve.  Depression screen [facility] 2/9 12/26/2017 12/11/2017 12/06/2017  Decreased Interest 0 0 0  Down, Depressed, Hopeless 0 0 0  PHQ - 2 Score 0 0 0  Altered sleeping - - -  Tired, decreased energy - - -  Change in appetite - - -  Feeling bad or failure about yourself  - - -  Trouble concentrating - - -  Moving slowly or fidgety/restless - - -  Suicidal thoughts - - -  PHQ-9 Score - - -  Some recent data might be hidden    History [name] has a past medical history of Aortic stenosis, CAD (coronary artery disease), Carotid stenosis, Cataract, CVA (cerebral infarction) (12/2010), DVT (deep venous thrombosis) (HCC), History of pulmonary embolism, History of rib fracture, History of shingles, Hyperlipidemia, Hypothyroidism, Pacemaker  Biotronik, Personal history of subdural hematoma, S/P cholecystectomy (2005), S/P shoulder surgery, Stroke (HCC) (2012), and Syncope.   He has a past surgical history that includes Aortic valve replacement (1985); Cardiac catheterization; Vascular surgery; Brain surgery; permanent pacemaker insertion (N/A, 08/01/2012); and Eye surgery.   His family history includes Arthritis in his sister; Cancer in his father; Coronary artery disease in his other; Heart attack in his brother, brother, and father; Heart disease in his  brother and father; Stroke in his mother.He reports that  has never smoked. he has never used smokeless tobacco. He reports that he does not drink alcohol or use drugs.    ROS Review of Systems  Constitutional: Negative for chills, diaphoresis and fever.  HENT: Negative for rhinorrhea and sore throat.   Respiratory: Negative for cough and shortness of breath.   Cardiovascular: Positive for palpitations. Negative for chest pain and leg swelling.  Gastrointestinal: Negative for abdominal pain.  Musculoskeletal: Negative for arthralgias and myalgias.  Skin: Negative for rash.  Neurological: Negative for weakness and headaches.    Objective:  BP 103/61   Pulse 78   Temp (!) 97.2 F (36.2 C) (Oral)   Ht 5\' 8"  (1.727 m)   Wt 144 lb 6 oz (65.5 kg)   BMI 21.95 kg/m   BP Readings from Last 3 Encounters:  01/15/18 103/61  12/26/17 118/64  12/11/17 (!) 126/54    Wt Readings from Last 3 Encounters:  01/15/18 144 lb 6 oz (65.5 kg)  12/26/17 139 lb (63 kg)  12/11/17 139 lb (63 kg)     Physical Exam  Constitutional: He is oriented to person, place, and time. He appears well-developed and well-nourished.  HENT:  Head: Normocephalic and atraumatic.  Right Ear: External ear normal.  Left Ear: External ear normal.  Mouth/Throat: No oropharyngeal exudate or posterior oropharyngeal erythema.  Eyes: Pupils are equal, round, and reactive to light.  Neck: Normal range of motion. Neck supple.  Cardiovascular: Normal rate.  No murmur heard. Irregular rhythm 3 sinus beats followed by a skipped beat with occasional 2  sinus beats followed by a skipped beat  Pulmonary/Chest: Breath sounds normal. No respiratory distress.  Musculoskeletal: Normal range of motion. He exhibits no edema or tenderness.  Neurological: He is alert and oriented to person, place, and time.  Skin: Skin is warm and dry.  Vitals reviewed.     Assessment & Plan:   [name] was seen today for follow-up.  Diagnoses  and all orders for this visit:  Long term current use of anticoagulant -     CoaguChek XS/INR Waived  AF (paroxysmal atrial fibrillation) (HCC)  S/P AVR (aortic valve replacement)  Other orders -     POCT INR    Continue Coumadin as he is 3 mg on Sunday Tuesday Thursday and 1.5 mg on Monday Wednesday Friday Saturday   I am having [name] maintain his multivitamin, docusate sodium, aspirin EC, levothyroxine, pantoprazole, simvastatin, COUMADIN, ezetimibe, dronedarone, dronedarone, potassium chloride, tamsulosin, latanoprost, and furosemide.  Allergies as of 01/15/2018      Reactions   Ivp Dye [iodinated Diagnostic Agents] Swelling   Swelling   Codeine Nausea Only   Nausea   Sulfonamide Derivatives Hives, Itching   Hives itching      Medication List        Accurate as of 01/15/18  9:13 AM. Always use your most recent med list.          aspirin EC 81 MG tablet Take 1 tablet (81 mg total) by mouth daily.   COUMADIN 3 MG tablet Generic drug:  warfarin Take as directed by the anticoagulation clinic. If you are unsure how to take this medication, talk to your nurse or doctor. Original instructions:  TAKE 1 DAILY EXCEPT 1/2 TABLET ON Monday AND FRIDAY   docusate sodium 100 MG capsule Commonly known as:  COLACE Take 100 mg by mouth daily.   dronedarone 400 MG tablet Commonly known as:  MULTAQ Take 1 tablet (400 mg total) by mouth 2 (two) times daily with a meal.   dronedarone 400 MG tablet Commonly known as:  MULTAQ Take 1 tablet (400 mg total) by mouth 2 (two) times daily with a meal.   ezetimibe 10 MG tablet Commonly known as:  ZETIA TAKE 1 TABLET BY MOUTH EVERY DAY   furosemide 20 MG tablet Commonly known as:  LASIX Take 1 tablet (20 mg total) by mouth every other day.   latanoprost 0.005 % ophthalmic solution Commonly known as:  XALATAN INSTILL 1 DROP INTO RIGHT EYE AT BEDTIME. BEGIN NEW BOTTLE EVERY 42 DAYS   levothyroxine 75 MCG  tablet Commonly known as:  SYNTHROID, LEVOTHROID TAKE 1 TABLET EVERY DAY   multivitamin tablet Take 1 tablet by mouth daily.   pantoprazole 40 MG tablet Commonly known as:  PROTONIX TAKE 1 TABLET EVERY DAY   potassium chloride 10 MEQ tablet Commonly known as:  K-DUR,KLOR-CON Take 1 tablet (10 mEq total) by mouth every morning.   simvastatin 40 MG tablet Commonly known as:  ZOCOR TAKE 1 TABLET AT BEDTIME   tamsulosin 0.4 MG Caps capsule Commonly known as:  FLOMAX Take 1 capsule (0.4 mg total) by mouth daily after breakfast.        Follow-up: Return in about 3 months (around 04/17/2018).  This will be for general medical checkup.  He will need INR checks monthly with either me or [name], M.D.

## 2018-01-16 LAB — CMP14+EGFR
A/G RATIO: 1.3 (ref 1.2–2.2)
ALBUMIN: 4 g/dL (ref 3.5–4.7)
ALT: 10 IU/L (ref 0–44)
AST: 14 IU/L (ref 0–40)
Alkaline Phosphatase: 67 IU/L (ref 39–117)
BUN / CREAT RATIO: 8 — AB (ref 10–24)
BUN: 13 mg/dL (ref 8–27)
Bilirubin Total: 0.8 mg/dL (ref 0.0–1.2)
CHLORIDE: 106 mmol/L (ref 96–106)
CO2: 26 mmol/L (ref 20–29)
Calcium: 8.7 mg/dL (ref 8.6–10.2)
Creatinine, Ser: 1.59 mg/dL — ABNORMAL HIGH (ref 0.76–1.27)
GFR, EST AFRICAN AMERICAN: 44 mL/min/{1.73_m2} — AB (ref >59)
GFR, EST NON AFRICAN AMERICAN: 38 mL/min/{1.73_m2} — AB (ref >59)
GLOBULIN, TOTAL: 3.1 g/dL (ref 1.5–4.5)
Glucose: 89 mg/dL (ref 65–99)
POTASSIUM: 4.7 mmol/L (ref 3.5–5.2)
SODIUM: 142 mmol/L (ref 134–144)
TOTAL PROTEIN: 7.1 g/dL (ref 6.0–8.5)

## 2018-01-19 ENCOUNTER — Other Ambulatory Visit: Payer: Self-pay | Admitting: Family Medicine

## 2018-01-20 NOTE — Progress Notes (Signed)
Cardiology Office Note Date:  01/23/2018  Patient ID:  [name], DOB 1928-03-03, MRN [id number] PCP:  [name], MD  Electrophysiologist: Dr. Graciela Husbands  Chief Complaint:  planned f/u  History of Present Illness: [name] is a 84 y.o. male with history of  Mechanical AVR, SDH requiring intervention in 2004, in 2012 has a stroke with ASA added to his warfarin regime, carotid sinus hypersensitivity with PPM, post op DVT/PE with IVCF, HLD, hypothyroidism, CKD (III).  He comes to the office today to be seen for Dr. Graciela Husbands, last seeing him in Jan 2109, at that time visits with his PMD reported low BP with reduction in his meds, also noted reportedly HR 40's .  Dr. Graciela Husbands noted a significant increase in PVCs from 6 >>16%, and recent hx trouble with edema/fluid OL.  Echo was ordered, and planned for f/u in a couple months with thought to revisit PVC burden, if remained up, would 1st try overdrive A pacing to suppress, secondary option would be an AAD given tendency toward hypotension.  His echo noted a recution EF from 55-60% >> 35-40%.  Recommended mexilitine  150mg  BID OR Multaq 400mg  BID, the family was going to check on affordability and decide, if both were expensive then amiodarone.   He comes today accompanied by his son who takes care of his medicines.  Since he saw Dr. Graciela Husbands his PMD started him and put him on lasix, this cleared up his edema and is taking it QOD now, his PMD follow his and his labs including his INR/warrfarin management.  The patient feels well.  He will rarely feel a couple strong beats here and there.  He is typically active with his yard/garden and very antsy for the weather to change so he can get back out there.  He denies any kind of CP, no rest SOB or DOE.  No symptoms of PND or orthopnea.  No dizziness, near syncope or syncope.  He has had no bleeding or signs of bleeding reported  DEVICE information:   Biotronik dual chamber PPM, implanted 08/01/12,  Dr. Ladona Ridgel   Past Medical History:  Diagnosis Date  . Aortic stenosis    St.Jude AVR in 1985; Echo 8/12: EF 50-55%, mild LVH, mech AV with mild AI, mean gradient 14 mmHg, mild to mod MR;  Echo 8/13: EF 60-65%, AVR ok with mean gradient 19 mmHg  . CAD (coronary artery disease)    Mild, nonobstructive at catheterization 5/01  . Carotid stenosis    Dopplers 8/13: RICA 40-50% and LICA 0-39% => followup 1 year  . Cataract   . CVA (cerebral infarction) 12/2010  . DVT (deep venous thrombosis) (HCC)   . History of pulmonary embolism    post op DVT/PE;  s/p IVC filter  . History of rib fracture   . History of shingles   . Hyperlipidemia   . Hypothyroidism   . Pacemaker  Biotronik   . Personal history of subdural hematoma    Requiring craniotomy  . S/P cholecystectomy 2005  . S/P shoulder surgery    left  . Stroke (HCC) 2012  . Syncope    + carotid sinus massage    Past Surgical History:  Procedure Laterality Date  . AORTIC VALVE REPLACEMENT  1985   St. Jude / mechanical  . BRAIN SURGERY    . CARDIAC CATHETERIZATION    . EYE SURGERY     bilateral cataract  . PERMANENT PACEMAKER INSERTION N/A 08/01/2012   Procedure:  PERMANENT PACEMAKER INSERTION;  Surgeon: Duke Salvia, MD;  Location: [facility] CATH LAB;  Service: Cardiovascular;  Laterality: N/A;  . VASCULAR SURGERY      Current Outpatient Medications  Medication Sig Dispense Refill  . aspirin EC 81 MG tablet Take 1 tablet (81 mg total) by mouth daily.    Marland Kitchen COUMADIN 3 MG tablet TAKE 1 DAILY EXCEPT 1/2 TABLET ON Monday AND FRIDAY 90 tablet 0  . docusate sodium (COLACE) 100 MG capsule Take 100 mg by mouth daily.     Marland Kitchen dronedarone (MULTAQ) 400 MG tablet Take 1 tablet (400 mg total) by mouth 2 (two) times daily with a meal. 180 tablet 3  . ezetimibe (ZETIA) 10 MG tablet TAKE 1 TABLET BY MOUTH EVERY DAY 90 tablet 0  . furosemide (LASIX) 20 MG tablet Take 1 tablet (20 mg total) by mouth every other day. 90 tablet 0  . latanoprost  (XALATAN) 0.005 % ophthalmic solution INSTILL 1 DROP INTO RIGHT EYE AT BEDTIME. BEGIN NEW BOTTLE EVERY 42 DAYS 7.5 mL 0  . levothyroxine (SYNTHROID, LEVOTHROID) 75 MCG tablet TAKE 1 TABLET EVERY DAY 90 tablet 2  . Multiple Vitamin (MULTIVITAMIN) tablet Take 1 tablet by mouth daily.      . pantoprazole (PROTONIX) 40 MG tablet TAKE 1 TABLET EVERY DAY 90 tablet 0  . potassium chloride (K-DUR,KLOR-CON) 10 MEQ tablet Take 1 tablet (10 mEq total) by mouth every morning. 90 tablet 0  . simvastatin (ZOCOR) 40 MG tablet TAKE 1 TABLET AT BEDTIME 90 tablet 0  . tamsulosin (FLOMAX) 0.4 MG CAPS capsule Take 1 capsule (0.4 mg total) by mouth daily after breakfast. 90 capsule 0   No current facility-administered medications for this visit.     Allergies:   Ivp dye [iodinated diagnostic agents]; Codeine; and Sulfonamide derivatives   Social History:  The patient  reports that  has never smoked. he has never used smokeless tobacco. He reports that he does not drink alcohol or use drugs.   Family History:  The patient's family history includes Arthritis in his sister; Cancer in his father; Coronary artery disease in his other; Heart attack in his brother, brother, and father; Heart disease in his brother and father; Stroke in his mother.  ROS:  Please see the history of present illness.    All other systems are reviewed and otherwise negative.   PHYSICAL EXAM:  VS:  BP 110/62   Pulse 70   Ht 5\' 8"  (1.727 m)   Wt 142 lb (64.4 kg)   BMI 21.59 kg/m  BMI: Body mass index is 21.59 kg/m. Well nourished, well developed, in no acute distress  HEENT: normocephalic, atraumatic  Neck: no JVD, carotid bruits or masses Cardiac:  RRR; valve is appreciated, no rubs, or gallops Lungs:  CTA b/l, no wheezing, rhonchi or rales  Abd: soft, nontender MS: no deformity, age appropriate atrophy Ext: no edema  Skin: warm and dry, no rash Neuro:  No gross deficits appreciated Psych: euthymic mood, full affect  PPM site  is stable, no tethering or discomfort   EKG:  Not done today PPM check today and reviewed by myself: battery and lead measurements are ok, he has had only 2 AMS total of 10 seconds, PVC burden is down to 11%, 45% AP, 42% VP  12/13/17: TTE Study Conclusions - Left ventricle: The cavity size was normal. There was mild   concentric hypertrophy. Systolic function was moderately reduced.   The estimated ejection fraction was in the range  of 35% to 40%.   Wall motion was normal; there were no regional wall motion   abnormalities. - Ventricular septum: Septal motion showed paradox. - Aortic valve: A mechanical bi-leaflet valve sits wel in the   aortic position. Normal transaortic gradients. Transvalvular   velocity was within the normal range. There was no stenosis.   There was mild regurgitation. Mean gradient (S): 15 mm Hg. Peak   gradient (S): 29 mm Hg. - Mitral valve: There was mild regurgitation. - Right ventricle: Pacer wire or catheter noted in right ventricle. - Right atrium: Pacer wire or catheter noted in right atrium. - Tricuspid valve: There was mild regurgitation. - Pulmonic valve: There was trivial regurgitation. - Pulmonary arteries: Systolic pressure was within the normal   range. - Inferior vena cava: The vessel was normal in size. The   respirophasic diameter changes were in the normal range (= 50%),   consistent with normal central venous pressure. - Pericardium, extracardiac: There was no pericardial effusion.  10/12/16: LVEF 55-60&  06/28/12: Echocardiogram Study Conclusions - Left ventricle: The cavity size was normal. Wall thickness was normal. Systolic function was normal. The estimated ejection fraction was in the range of 60% to 65%. - Aortic valve: AV prosthesis appears to open well. PEak and mean gradients through the valve are 34 and 19 mm Hg respectively. There is trace AI.  Recent Labs: 12/03/2017: Hemoglobin 11.0; Platelets 123; TSH  0.242 12/11/2017: BNP 290.2 01/15/2018: ALT 10; BUN 13; Creatinine, Ser 1.59; Potassium 4.7; Sodium 142  No results found for requested labs within last 8760 hours.   Estimated Creatinine Clearance: 29.8 mL/min (A) (by C-G formula based on SCr of 1.59 mg/dL (H)).   Wt Readings from Last 3 Encounters:  01/23/18 142 lb (64.4 kg)  01/15/18 144 lb 6 oz (65.5 kg)  12/26/17 139 lb (63 kg)     Other studies reviewed: Additional studies/records reviewed today include: summarized above    ASSESSMENT AND PLAN:  1. Carotid hypersensitivity, PPM     device functioning intact  2. Mechanical AVR    On warfarin, monitored and managed with his PMD office    Echo 2013 valve functioning well  3. HTN     Looks OK, no changes  4. AFib/flutter     CHA2DS2Vasc is 6 on warfarin  5. PVC's     Burden is down some with the multaq     Will no make any programming changes (over-drive pace) given there has been reduction in PVCs, and will avoid any more RV pacing   6. CM     follow with suppression of PVCs     Exam does not suggest fluid OL, weight is down a couple pounds     Lasix/labs are being followed with his PMD     Discussed lasix/potassim usually travel together when reducing his lasix to QOD they did not (were not told to that they recall) reduce his K+, the patient's son will call the PMD to clarify     BP may limit meds     CKD (III), will hold off ACE/ARB for now with PVC suppression    Disposition: Will see him back in 3 mo to re-visit PVC burden, symptoms given he will have gotten back to his out door activities.  Sooner if needed.  Current medicines are reviewed at length with the patient today.  The patient did not have any concerns regarding medicines.  Judith Blonder, PA-C 01/23/2018 9:34 AM  [place] [address] [phone] (office)  [phone] (fax)

## 2018-01-21 ENCOUNTER — Ambulatory Visit: Payer: Medicare HMO | Admitting: Physician Assistant

## 2018-01-21 ENCOUNTER — Other Ambulatory Visit: Payer: Self-pay | Admitting: Family Medicine

## 2018-01-22 ENCOUNTER — Other Ambulatory Visit: Payer: Self-pay | Admitting: *Deleted

## 2018-01-22 MED ORDER — COUMADIN 3 MG PO TABS: ORAL_TABLET | ORAL | 0 refills | Status: DC

## 2018-01-23 ENCOUNTER — Ambulatory Visit: Payer: Medicare HMO | Admitting: Physician Assistant

## 2018-01-23 ENCOUNTER — Telehealth: Payer: Self-pay | Admitting: Family Medicine

## 2018-01-23 ENCOUNTER — Other Ambulatory Visit: Payer: Self-pay | Admitting: Family Medicine

## 2018-01-23 VITALS — BP 110/62 | HR 70 | Ht 68.0 in | Wt 142.0 lb

## 2018-01-23 DIAGNOSIS — Z950 Presence of cardiac pacemaker: Secondary | ICD-10-CM | POA: Diagnosis not present

## 2018-01-23 DIAGNOSIS — I428 Other cardiomyopathies: Secondary | ICD-10-CM | POA: Diagnosis not present

## 2018-01-23 DIAGNOSIS — I10 Essential (primary) hypertension: Secondary | ICD-10-CM

## 2018-01-23 DIAGNOSIS — I493 Ventricular premature depolarization: Secondary | ICD-10-CM | POA: Diagnosis not present

## 2018-01-23 DIAGNOSIS — Z952 Presence of prosthetic heart valve: Secondary | ICD-10-CM

## 2018-01-23 DIAGNOSIS — I480 Paroxysmal atrial fibrillation: Secondary | ICD-10-CM | POA: Diagnosis not present

## 2018-01-23 NOTE — Patient Instructions (Signed)
Medication Instructions:  Your physician recommends that you continue on your current medications as directed. Please refer to the Current Medication list given to you today.    If you need a refill on your cardiac medications before your next appointment, please call your pharmacy.  Labwork: NONE ORDERED  TODAY    Testing/Procedures: NONE ORDERED  TODAY    Follow-Up: 3 MONTHS WITH URSUY    Any Other Special Instructions Will Be Listed Below (If Applicable).   PLEASE CONFIRM WITH PRIMARY DOCTOR OF POTASSIUMCCORRECT  DOSAGE WITH  LASIX DOSAGE

## 2018-01-23 NOTE — Telephone Encounter (Signed)
Every other day should be fine. Thanks, WS

## 2018-01-24 NOTE — Telephone Encounter (Signed)
Son aware that patient can take potassium every other day. Per Dr. Darlyn Read

## 2018-01-30 DIAGNOSIS — H401121 Primary open-angle glaucoma, left eye, mild stage: Secondary | ICD-10-CM | POA: Diagnosis not present

## 2018-01-30 DIAGNOSIS — H401112 Primary open-angle glaucoma, right eye, moderate stage: Secondary | ICD-10-CM | POA: Diagnosis not present

## 2018-02-05 ENCOUNTER — Ambulatory Visit: Payer: Medicare HMO | Admitting: Family Medicine

## 2018-02-11 ENCOUNTER — Other Ambulatory Visit: Payer: Self-pay | Admitting: Family Medicine

## 2018-02-11 DIAGNOSIS — N400 Enlarged prostate without lower urinary tract symptoms: Secondary | ICD-10-CM

## 2018-03-12 ENCOUNTER — Ambulatory Visit (INDEPENDENT_AMBULATORY_CARE_PROVIDER_SITE_OTHER): Payer: Medicare HMO | Admitting: Family Medicine

## 2018-03-12 ENCOUNTER — Encounter: Payer: Self-pay | Admitting: Family Medicine

## 2018-03-12 VITALS — BP 112/69 | HR 80 | Temp 98.0°F | Ht 68.0 in | Wt 137.5 lb

## 2018-03-12 DIAGNOSIS — Z952 Presence of prosthetic heart valve: Secondary | ICD-10-CM | POA: Diagnosis not present

## 2018-03-12 DIAGNOSIS — Z7901 Long term (current) use of anticoagulants: Secondary | ICD-10-CM

## 2018-03-12 DIAGNOSIS — I480 Paroxysmal atrial fibrillation: Secondary | ICD-10-CM

## 2018-03-12 DIAGNOSIS — L03313 Cellulitis of chest wall: Secondary | ICD-10-CM

## 2018-03-12 LAB — COAGUCHEK XS/INR WAIVED
INR: 2.7 — ABNORMAL HIGH (ref 0.9–1.1)
PROTHROMBIN TIME: 32 s

## 2018-03-12 LAB — POCT INR: INR: 2.7

## 2018-03-12 MED ORDER — CETIRIZINE HCL 10 MG PO TABS: 10.0000 mg | ORAL_TABLET | Freq: Every day | ORAL | 1 refills | Status: DC

## 2018-03-12 MED ORDER — EZETIMIBE 10 MG PO TABS: 10.0000 mg | ORAL_TABLET | Freq: Every day | ORAL | 1 refills | Status: DC

## 2018-03-12 MED ORDER — SIMVASTATIN 40 MG PO TABS: 40.0000 mg | ORAL_TABLET | Freq: Every day | ORAL | 1 refills | Status: DC

## 2018-03-12 MED ORDER — AMOXICILLIN 500 MG PO CAPS: 500.0000 mg | ORAL_CAPSULE | Freq: Three times a day (TID) | ORAL | 0 refills | Status: DC

## 2018-03-12 NOTE — Progress Notes (Signed)
Subjective:  Patient ID: [name], male    DOB: [birth year]  Age: 84 y.o. MRN: [id number]  CC: Rash (pt here today c/o left nipple/breast red, itchy and tender for the past 5 days)   HPI [name] presents for Patient in for follow-up of atrial fibrillation. Patient denies any recent bouts of chest pain or palpitations. Additionally, patient is taking anticoagulants. Patient denies any recent excessive bleeding episodes including epistaxis, bleeding from the gums, genitalia, rectal bleeding or hematuria. Additionally there has been no excessive bruising.  Patient has a red rash rating from the left nipple over the last several days.  It is very tender and swollen.  No known injury.  Depression screen [facility] 2/9 03/12/2018 12/26/2017 12/11/2017  Decreased Interest 0 0 0  Down, Depressed, Hopeless 0 0 0  PHQ - 2 Score 0 0 0  Altered sleeping - - -  Tired, decreased energy - - -  Change in appetite - - -  Feeling bad or failure about yourself  - - -  Trouble concentrating - - -  Moving slowly or fidgety/restless - - -  Suicidal thoughts - - -  PHQ-9 Score - - -  Some recent data might be hidden    History [name] has a past medical history of Aortic stenosis, CAD (coronary artery disease), Carotid stenosis, Cataract, CVA (cerebral infarction) (12/2010), DVT (deep venous thrombosis) (HCC), History of pulmonary embolism, History of rib fracture, History of shingles, Hyperlipidemia, Hypothyroidism, Pacemaker  Biotronik, Personal history of subdural hematoma, S/P cholecystectomy (2005), S/P shoulder surgery, Stroke (HCC) (2012), and Syncope.   He has a past surgical history that includes Aortic valve replacement (1985); Cardiac catheterization; Vascular surgery; Brain surgery; permanent pacemaker insertion (N/A, 08/01/2012); and Eye surgery.   His family history includes Arthritis in his sister; Cancer in his father; Coronary artery disease in his other; Heart attack in his  brother, brother, and father; Heart disease in his brother and father; Stroke in his mother.He reports that he has never smoked. He has never used smokeless tobacco. He reports that he does not drink alcohol or use drugs.    ROS Review of Systems  Constitutional: Negative for fever.  Respiratory: Negative for shortness of breath.   Cardiovascular: Negative for chest pain.  Musculoskeletal: Negative for arthralgias.  Skin: Positive for rash (Left chest).    Objective:  BP 112/69   Pulse 80   Temp 98 F (36.7 C) (Oral)   Ht 5\' 8"  (1.727 m)   Wt 137 lb 8 oz (62.4 kg)   BMI 20.91 kg/m   BP Readings from Last 3 Encounters:  03/12/18 112/69  01/23/18 110/62  01/15/18 103/61    Wt Readings from Last 3 Encounters:  03/12/18 137 lb 8 oz (62.4 kg)  01/23/18 142 lb (64.4 kg)  01/15/18 144 lb 6 oz (65.5 kg)     Physical Exam  Constitutional: He is oriented to person, place, and time. He appears well-developed and well-nourished.  HENT:  Head: Normocephalic and atraumatic.  Right Ear: External ear normal.  Left Ear: External ear normal.  Mouth/Throat: No oropharyngeal exudate or posterior oropharyngeal erythema.  Eyes: Pupils are equal, round, and reactive to light.  Neck: Normal range of motion. Neck supple.  Cardiovascular: Normal rate. An irregular rhythm present.  No murmur heard. Pulmonary/Chest: Breath sounds normal. No respiratory distress.  Neurological: He is alert and oriented to person, place, and time.  Skin: Rash (The area is tender with puffy edema without  fluctuance) noted. There is erythema (The left areola and nipple are erythematous and swollen and much of the left breast tissue is erythematous as well.  This extends superiorly and medially as well as laterally and slightly inferiorly.  Household about 15 cm from medial to lateral margin).  Vitals reviewed.     Assessment & Plan:   [name] was seen today for rash.  Diagnoses and all orders for this  visit:  Long term current use of anticoagulant -     CoaguChek XS/INR Waived  AF (paroxysmal atrial fibrillation) (HCC)  S/P AVR (aortic valve replacement)  Cellulitis of chest wall  Other orders -     ezetimibe (ZETIA) 10 MG tablet; Take 1 tablet (10 mg total) by mouth daily. -     simvastatin (ZOCOR) 40 MG tablet; Take 1 tablet (40 mg total) by mouth at bedtime. -     amoxicillin (AMOXIL) 500 MG capsule; Take 1 capsule (500 mg total) by mouth 3 (three) times daily. -     cetirizine (ZYRTEC) 10 MG tablet; Take 1 tablet (10 mg total) by mouth daily. Prn for itching -     POCT INR       I have changed Kaylub A. Cottman's ezetimibe and simvastatin. I am also having him start on amoxicillin and cetirizine. Additionally, I am having him maintain his multivitamin, docusate sodium, aspirin EC, dronedarone, latanoprost, furosemide, COUMADIN, levothyroxine, tamsulosin, potassium chloride, and pantoprazole.  Allergies as of 03/12/2018      Reactions   Ivp Dye [iodinated Diagnostic Agents] Swelling   Swelling   Codeine Nausea Only   Nausea   Sulfonamide Derivatives Hives, Itching   Hives itching      Medication List        Accurate as of 03/12/18 12:53 PM. Always use your most recent med list.          amoxicillin 500 MG capsule Commonly known as:  AMOXIL Take 1 capsule (500 mg total) by mouth 3 (three) times daily.   aspirin EC 81 MG tablet Take 1 tablet (81 mg total) by mouth daily.   cetirizine 10 MG tablet Commonly known as:  ZYRTEC Take 1 tablet (10 mg total) by mouth daily. Prn for itching   COUMADIN 3 MG tablet Generic drug:  warfarin Take as directed by the anticoagulation clinic. If you are unsure how to take this medication, talk to your nurse or doctor. Original instructions:  TAKE 1 DAILY EXCEPT 1/2 TABLET ON Monday AND FRIDAY   docusate sodium 100 MG capsule Commonly known as:  COLACE Take 100 mg by mouth daily.   dronedarone 400 MG tablet Commonly  known as:  MULTAQ Take 1 tablet (400 mg total) by mouth 2 (two) times daily with a meal.   ezetimibe 10 MG tablet Commonly known as:  ZETIA Take 1 tablet (10 mg total) by mouth daily.   furosemide 20 MG tablet Commonly known as:  LASIX Take 1 tablet (20 mg total) by mouth every other day.   latanoprost 0.005 % ophthalmic solution Commonly known as:  XALATAN INSTILL 1 DROP INTO RIGHT EYE AT BEDTIME. BEGIN NEW BOTTLE EVERY 42 DAYS   levothyroxine 75 MCG tablet Commonly known as:  SYNTHROID, LEVOTHROID TAKE 1 TABLET EVERY DAY   multivitamin tablet Take 1 tablet by mouth daily.   pantoprazole 40 MG tablet Commonly known as:  PROTONIX TAKE 1 TABLET EVERY DAY   potassium chloride 10 MEQ tablet Commonly known as:  K-DUR,KLOR-CON TAKE 1 TABLET (10  MEQ TOTAL) BY MOUTH EVERY MORNING.   simvastatin 40 MG tablet Commonly known as:  ZOCOR Take 1 tablet (40 mg total) by mouth at bedtime.   tamsulosin 0.4 MG Caps capsule Commonly known as:  FLOMAX TAKE 1 CAPSULE (0.4 MG TOTAL) BY MOUTH DAILY AFTER BREAKFAST.      Anticoagulation Summary  As of 03/12/2018   INR goal:   2.5-3.5  TTR:   31.3 % (4.8 y)  INR used for dosing:     Warfarin maintenance plan:   1.5 mg (3 mg x 0.5) every Mon, Fri; 3 mg (3 mg x 1) all other days  Weekly warfarin total:   18 mg  Weekly max warfarin dose:   40 mg  Plan last modified:   [name], MD (03/12/2018)  Next INR check:     Target end date:   Indefinite   Indications   AF (paroxysmal atrial fibrillation) (HCC) [I48.0] S/P AVR (aortic valve replacement) [Z95.2]        Anticoagulation Episode Summary    INR check location:      Preferred lab:      Send INR reminders to:   [facility]   Comments:       Anticoagulation Care Providers    Provider Role Specialty Phone number   Chari Manning, PharmD Referring Pharmacist [phone]      Follow-up: Return in about 1 month (around 04/11/2018).  [name], M.D.

## 2018-03-13 ENCOUNTER — Other Ambulatory Visit: Payer: Self-pay | Admitting: Family Medicine

## 2018-03-20 ENCOUNTER — Other Ambulatory Visit: Payer: Self-pay | Admitting: *Deleted

## 2018-03-22 ENCOUNTER — Encounter: Payer: Medicare HMO | Admitting: Pharmacist Clinician (PhC)/ Clinical Pharmacy Specialist

## 2018-03-25 ENCOUNTER — Telehealth: Payer: Self-pay | Admitting: Family Medicine

## 2018-03-25 NOTE — Telephone Encounter (Signed)
Please review and advise Not on current med list 

## 2018-03-26 ENCOUNTER — Other Ambulatory Visit: Payer: Self-pay | Admitting: Family Medicine

## 2018-03-26 MED ORDER — SIMVASTATIN 10 MG PO TABS: 10.0000 mg | ORAL_TABLET | Freq: Every day | ORAL | 3 refills | Status: DC

## 2018-03-26 NOTE — Telephone Encounter (Signed)
Simvastatin has interaction of concern with multaq. Dose of simvastatin needs to be decreased. I will send in at 10 mg max dose as recommended by manufacturer.

## 2018-03-26 NOTE — Telephone Encounter (Signed)
[name] aware of dose change due to interaction

## 2018-03-30 ENCOUNTER — Other Ambulatory Visit: Payer: Self-pay | Admitting: Family Medicine

## 2018-04-17 ENCOUNTER — Ambulatory Visit: Payer: Medicare HMO | Admitting: Family Medicine

## 2018-04-22 ENCOUNTER — Encounter: Payer: Self-pay | Admitting: Family Medicine

## 2018-04-22 ENCOUNTER — Ambulatory Visit (INDEPENDENT_AMBULATORY_CARE_PROVIDER_SITE_OTHER): Payer: Medicare HMO | Admitting: Family Medicine

## 2018-04-22 VITALS — BP 116/57 | HR 63 | Ht 68.0 in | Wt 136.1 lb

## 2018-04-22 DIAGNOSIS — E039 Hypothyroidism, unspecified: Secondary | ICD-10-CM

## 2018-04-22 DIAGNOSIS — R54 Age-related physical debility: Secondary | ICD-10-CM

## 2018-04-22 DIAGNOSIS — R5383 Other fatigue: Secondary | ICD-10-CM | POA: Diagnosis not present

## 2018-04-22 DIAGNOSIS — R4189 Other symptoms and signs involving cognitive functions and awareness: Secondary | ICD-10-CM

## 2018-04-22 DIAGNOSIS — R6889 Other general symptoms and signs: Secondary | ICD-10-CM | POA: Diagnosis not present

## 2018-04-22 DIAGNOSIS — I482 Chronic atrial fibrillation, unspecified: Secondary | ICD-10-CM

## 2018-04-22 DIAGNOSIS — Z7901 Long term (current) use of anticoagulants: Secondary | ICD-10-CM

## 2018-04-22 LAB — COAGUCHEK XS/INR WAIVED
INR: 2.3 — ABNORMAL HIGH (ref 0.9–1.1)
PROTHROMBIN TIME: 28 s

## 2018-04-22 NOTE — Progress Notes (Signed)
Subjective:  Patient ID: [name], male    DOB: 1928-05-19  Age: 84 y.o. MRN: [id number]  CC: Medical Management of Chronic Issues (pt here today for f/u of his Protime and also c/o being very fatigue)   HPI [name] presents for very tired fatigued.  He is not getting any sleep due to his wife's debility.  They have a home health aid coming to the daytime but he is up with her almost all night getting very little sleep he is feeling rough.  This is also leading to him getting more forgetful from exhaustion.  Patient is having difficulty with his memory.  He is more more forgetful of his own affairs this is from a combination of the lack of sleep but also tendency toward cognitive impairment.  Depression screen [facility] 2/9 03/12/2018 12/26/2017 12/11/2017  Decreased Interest 0 0 0  Down, Depressed, Hopeless 0 0 0  PHQ - 2 Score 0 0 0  Altered sleeping - - -  Tired, decreased energy - - -  Change in appetite - - -  Feeling bad or failure about yourself  - - -  Trouble concentrating - - -  Moving slowly or fidgety/restless - - -  Suicidal thoughts - - -  PHQ-9 Score - - -  Some recent data might be hidden    History [name] has a past medical history of Aortic stenosis, CAD (coronary artery disease), Carotid stenosis, Cataract, CVA (cerebral infarction) (12/2010), DVT (deep venous thrombosis) ([place]), History of pulmonary embolism, History of rib fracture, History of shingles, Hyperlipidemia, Hypothyroidism, Pacemaker  Biotronik, Personal history of subdural hematoma, S/P cholecystectomy (2005), S/P shoulder surgery, Stroke ([place]) (2012), and Syncope.   He has a past surgical history that includes Aortic valve replacement (1985); Cardiac catheterization; Vascular surgery; Brain surgery; permanent pacemaker insertion (N/A, 08/01/2012); and Eye surgery.   His family history includes Arthritis in his sister; Cancer in his father; Coronary artery disease in his other; Heart attack in  his brother, brother, and father; Heart disease in his brother and father; Stroke in his mother.He reports that he has never smoked. He has never used smokeless tobacco. He reports that he does not drink alcohol or use drugs.    ROS Review of Systems  Constitutional: Negative for fever.  Respiratory: Negative for shortness of breath.   Cardiovascular: Negative for chest pain.  Musculoskeletal: Negative for arthralgias.  Skin: Negative for rash.  Psychiatric/Behavioral: Positive for confusion and decreased concentration.    Objective:  BP (!) 116/57   Pulse 63   Ht _0  (1.727 m)   Wt 136 lb 2 oz (61.7 kg)   BMI 20.70 kg/m   BP Readings from Last 3 Encounters:  04/22/18 (!) 116/57  03/12/18 112/69  01/23/18 110/62    Wt Readings from Last 3 Encounters:  04/22/18 136 lb 2 oz (61.7 kg)  03/12/18 137 lb 8 oz (62.4 kg)  01/23/18 142 lb (64.4 kg)     Physical Exam  Constitutional: He is oriented to person, place, and time. No distress.  Cachectic  HENT:  Head: Normocephalic and atraumatic.  Right Ear: External ear normal.  Left Ear: External ear normal.  Nose: Nose normal.  Mouth/Throat: Oropharynx is clear and moist.  Eyes: Pupils are equal, round, and reactive to light. Conjunctivae and EOM are normal.  Neck: Normal range of motion. Neck supple. No thyromegaly present.  Cardiovascular: Normal rate, regular rhythm and normal heart sounds.  No murmur heard. Pulmonary/Chest:  Effort normal and breath sounds normal. No respiratory distress. He has no wheezes. He has no rales.  Abdominal: Soft. Bowel sounds are normal. He exhibits no distension. There is no tenderness.  Lymphadenopathy:    He has no cervical adenopathy.  Neurological: He is alert and oriented to person, place, and time. He has normal reflexes.  Skin: Skin is warm and dry.  Psychiatric: His behavior is normal. His affect is blunt. His speech is delayed. Cognition and memory are impaired. He exhibits  abnormal recent memory.      Assessment & Plan:   [name] was seen today for medical management of chronic issues.  Diagnoses and all orders for this visit:  Long term current use of anticoagulant -     CoaguChek XS/INR Waived -     Ambulatory referral to [place]  Other fatigue -     CBC with Differential/Platelet -     CMP14+EGFR -     TSH -     Vitamin B12 -     T4, Free -     Ambulatory referral to Home Health  Cognitive impairment -     Ambulatory referral to [place]  Chronic atrial fibrillation ([place])  Hypothyroidism, unspecified type  Age-related physical debility       I have discontinued [name]'s ezetimibe, amoxicillin, and cetirizine. I am also having him maintain his multivitamin, docusate sodium, aspirin EC, dronedarone, latanoprost, COUMADIN, levothyroxine, tamsulosin, potassium chloride, pantoprazole, simvastatin, and furosemide.  Allergies as of 04/22/2018      Reactions   Ivp Dye [iodinated Diagnostic Agents] Swelling   Swelling   Codeine Nausea Only   Nausea   Sulfonamide Derivatives Hives, Itching   Hives itching      Medication List        Accurate as of 04/22/18  6:43 PM. Always use your most recent med list.          aspirin EC 81 MG tablet Take 1 tablet (81 mg total) by mouth daily.   COUMADIN 3 MG tablet Generic drug:  warfarin Take as directed by the anticoagulation clinic. If you are unsure how to take this medication, talk to your nurse or doctor. Original instructions:  TAKE 1 DAILY EXCEPT 1/2 TABLET ON Monday AND FRIDAY   docusate sodium 100 MG capsule Commonly known as:  COLACE Take 100 mg by mouth daily.   dronedarone 400 MG tablet Commonly known as:  MULTAQ Take 1 tablet (400 mg total) by mouth 2 (two) times daily with a meal.   furosemide 20 MG tablet Commonly known as:  LASIX TAKE 1 TABLET BY MOUTH EVERY DAY   latanoprost 0.005 % ophthalmic solution Commonly known as:  XALATAN INSTILL 1  DROP INTO RIGHT EYE AT BEDTIME. BEGIN NEW BOTTLE EVERY 42 DAYS   levothyroxine 75 MCG tablet Commonly known as:  SYNTHROID, LEVOTHROID TAKE 1 TABLET EVERY DAY   multivitamin tablet Take 1 tablet by mouth daily.   pantoprazole 40 MG tablet Commonly known as:  PROTONIX TAKE 1 TABLET EVERY DAY   potassium chloride 10 MEQ tablet Commonly known as:  K-DUR,KLOR-CON TAKE 1 TABLET (10 MEQ TOTAL) BY MOUTH EVERY MORNING.   simvastatin 10 MG tablet Commonly known as:  ZOCOR Take 1 tablet (10 mg total) by mouth at bedtime.   tamsulosin 0.4 MG Caps capsule Commonly known as:  FLOMAX TAKE 1 CAPSULE (0.4 MG TOTAL) BY MOUTH DAILY AFTER BREAKFAST.        Follow-up: Return in about  1 month (around 05/22/2018).  Claretta Fraise, M.D.

## 2018-04-23 ENCOUNTER — Other Ambulatory Visit: Payer: Self-pay | Admitting: Family Medicine

## 2018-04-23 LAB — CMP14+EGFR
ALBUMIN: 3.9 g/dL (ref 3.5–4.7)
ALK PHOS: 70 IU/L (ref 39–117)
ALT: 11 IU/L (ref 0–44)
AST: 22 IU/L (ref 0–40)
Albumin/Globulin Ratio: 1.3 (ref 1.2–2.2)
BILIRUBIN TOTAL: 0.8 mg/dL (ref 0.0–1.2)
BUN / CREAT RATIO: 16 (ref 10–24)
BUN: 21 mg/dL (ref 8–27)
CHLORIDE: 104 mmol/L (ref 96–106)
CO2: 24 mmol/L (ref 20–29)
Calcium: 8.5 mg/dL — ABNORMAL LOW (ref 8.6–10.2)
Creatinine, Ser: 1.3 mg/dL — ABNORMAL HIGH (ref 0.76–1.27)
GFR calc non Af Amer: 49 mL/min/{1.73_m2} — ABNORMAL LOW (ref >59)
GFR, EST AFRICAN AMERICAN: 57 mL/min/{1.73_m2} — AB (ref >59)
GLOBULIN, TOTAL: 3 g/dL (ref 1.5–4.5)
Glucose: 84 mg/dL (ref 65–99)
Potassium: 4.4 mmol/L (ref 3.5–5.2)
SODIUM: 146 mmol/L — AB (ref 134–144)
TOTAL PROTEIN: 6.9 g/dL (ref 6.0–8.5)

## 2018-04-23 LAB — CBC WITH DIFFERENTIAL/PLATELET
BASOS: 1 %
Basophils Absolute: 0 10*3/uL (ref 0.0–0.2)
EOS (ABSOLUTE): 0.1 10*3/uL (ref 0.0–0.4)
EOS: 3 %
HEMATOCRIT: 34.9 % — AB (ref 37.5–51.0)
HEMOGLOBIN: 11.2 g/dL — AB (ref 13.0–17.7)
Immature Grans (Abs): 0 10*3/uL (ref 0.0–0.1)
Immature Granulocytes: 0 %
LYMPHS ABS: 0.8 10*3/uL (ref 0.7–3.1)
Lymphs: 22 %
MCH: 32.8 pg (ref 26.6–33.0)
MCHC: 32.1 g/dL (ref 31.5–35.7)
MCV: 102 fL — ABNORMAL HIGH (ref 79–97)
MONOCYTES: 10 %
Monocytes Absolute: 0.4 10*3/uL (ref 0.1–0.9)
Neutrophils Absolute: 2.3 10*3/uL (ref 1.4–7.0)
Neutrophils: 64 %
Platelets: 158 10*3/uL (ref 150–450)
RBC: 3.41 x10E6/uL — ABNORMAL LOW (ref 4.14–5.80)
RDW: 13.8 % (ref 12.3–15.4)
WBC: 3.6 10*3/uL (ref 3.4–10.8)

## 2018-04-23 LAB — TSH: TSH: 6.1 u[IU]/mL — AB (ref 0.450–4.500)

## 2018-04-23 LAB — VITAMIN B12: Vitamin B-12: 470 pg/mL (ref 232–1245)

## 2018-04-23 LAB — T4, FREE: Free T4: 1.26 ng/dL (ref 0.82–1.77)

## 2018-04-23 MED ORDER — LEVOTHYROXINE SODIUM 88 MCG PO TABS: 88.0000 ug | ORAL_TABLET | Freq: Every day | ORAL | 2 refills | Status: DC

## 2018-04-24 ENCOUNTER — Telehealth: Payer: Self-pay | Admitting: Family Medicine

## 2018-04-24 DIAGNOSIS — Z7901 Long term (current) use of anticoagulants: Secondary | ICD-10-CM | POA: Diagnosis not present

## 2018-04-24 DIAGNOSIS — Z86718 Personal history of other venous thrombosis and embolism: Secondary | ICD-10-CM | POA: Diagnosis not present

## 2018-04-24 DIAGNOSIS — E039 Hypothyroidism, unspecified: Secondary | ICD-10-CM

## 2018-04-24 DIAGNOSIS — Z950 Presence of cardiac pacemaker: Secondary | ICD-10-CM | POA: Diagnosis not present

## 2018-04-24 DIAGNOSIS — I2510 Atherosclerotic heart disease of native coronary artery without angina pectoris: Secondary | ICD-10-CM | POA: Diagnosis not present

## 2018-04-24 DIAGNOSIS — I480 Paroxysmal atrial fibrillation: Secondary | ICD-10-CM | POA: Diagnosis not present

## 2018-04-24 DIAGNOSIS — Z86711 Personal history of pulmonary embolism: Secondary | ICD-10-CM | POA: Diagnosis not present

## 2018-04-24 DIAGNOSIS — R54 Age-related physical debility: Secondary | ICD-10-CM | POA: Diagnosis not present

## 2018-04-24 DIAGNOSIS — R5383 Other fatigue: Secondary | ICD-10-CM | POA: Diagnosis not present

## 2018-04-24 DIAGNOSIS — G3184 Mild cognitive impairment, so stated: Secondary | ICD-10-CM | POA: Diagnosis not present

## 2018-04-24 NOTE — Telephone Encounter (Signed)
Aware of results and labs ordered

## 2018-04-26 DIAGNOSIS — I480 Paroxysmal atrial fibrillation: Secondary | ICD-10-CM | POA: Diagnosis not present

## 2018-04-26 DIAGNOSIS — Z950 Presence of cardiac pacemaker: Secondary | ICD-10-CM | POA: Diagnosis not present

## 2018-04-26 DIAGNOSIS — R5383 Other fatigue: Secondary | ICD-10-CM | POA: Diagnosis not present

## 2018-04-26 DIAGNOSIS — Z86718 Personal history of other venous thrombosis and embolism: Secondary | ICD-10-CM | POA: Diagnosis not present

## 2018-04-26 DIAGNOSIS — I2510 Atherosclerotic heart disease of native coronary artery without angina pectoris: Secondary | ICD-10-CM | POA: Diagnosis not present

## 2018-04-26 DIAGNOSIS — G3184 Mild cognitive impairment, so stated: Secondary | ICD-10-CM | POA: Diagnosis not present

## 2018-04-26 DIAGNOSIS — Z86711 Personal history of pulmonary embolism: Secondary | ICD-10-CM | POA: Diagnosis not present

## 2018-04-26 DIAGNOSIS — Z7901 Long term (current) use of anticoagulants: Secondary | ICD-10-CM | POA: Diagnosis not present

## 2018-04-26 DIAGNOSIS — R54 Age-related physical debility: Secondary | ICD-10-CM | POA: Diagnosis not present

## 2018-04-29 ENCOUNTER — Encounter: Payer: Self-pay | Admitting: Family Medicine

## 2018-04-29 ENCOUNTER — Telehealth: Payer: Self-pay | Admitting: *Deleted

## 2018-04-29 DIAGNOSIS — G3184 Mild cognitive impairment, so stated: Secondary | ICD-10-CM | POA: Diagnosis not present

## 2018-04-29 DIAGNOSIS — R5383 Other fatigue: Secondary | ICD-10-CM | POA: Diagnosis not present

## 2018-04-29 DIAGNOSIS — I480 Paroxysmal atrial fibrillation: Secondary | ICD-10-CM | POA: Diagnosis not present

## 2018-04-29 DIAGNOSIS — Z86718 Personal history of other venous thrombosis and embolism: Secondary | ICD-10-CM | POA: Diagnosis not present

## 2018-04-29 DIAGNOSIS — F0150 Vascular dementia without behavioral disturbance: Secondary | ICD-10-CM | POA: Insufficient documentation

## 2018-04-29 DIAGNOSIS — Z86711 Personal history of pulmonary embolism: Secondary | ICD-10-CM | POA: Diagnosis not present

## 2018-04-29 DIAGNOSIS — I2510 Atherosclerotic heart disease of native coronary artery without angina pectoris: Secondary | ICD-10-CM | POA: Diagnosis not present

## 2018-04-29 DIAGNOSIS — Z7901 Long term (current) use of anticoagulants: Secondary | ICD-10-CM | POA: Diagnosis not present

## 2018-04-29 DIAGNOSIS — R54 Age-related physical debility: Secondary | ICD-10-CM | POA: Diagnosis not present

## 2018-04-29 DIAGNOSIS — Z950 Presence of cardiac pacemaker: Secondary | ICD-10-CM | POA: Diagnosis not present

## 2018-04-30 DIAGNOSIS — Z950 Presence of cardiac pacemaker: Secondary | ICD-10-CM | POA: Diagnosis not present

## 2018-04-30 DIAGNOSIS — Z7901 Long term (current) use of anticoagulants: Secondary | ICD-10-CM | POA: Diagnosis not present

## 2018-04-30 DIAGNOSIS — I480 Paroxysmal atrial fibrillation: Secondary | ICD-10-CM | POA: Diagnosis not present

## 2018-04-30 DIAGNOSIS — Z86718 Personal history of other venous thrombosis and embolism: Secondary | ICD-10-CM | POA: Diagnosis not present

## 2018-04-30 DIAGNOSIS — I2510 Atherosclerotic heart disease of native coronary artery without angina pectoris: Secondary | ICD-10-CM | POA: Diagnosis not present

## 2018-04-30 DIAGNOSIS — G3184 Mild cognitive impairment, so stated: Secondary | ICD-10-CM | POA: Diagnosis not present

## 2018-04-30 DIAGNOSIS — Z86711 Personal history of pulmonary embolism: Secondary | ICD-10-CM | POA: Diagnosis not present

## 2018-04-30 DIAGNOSIS — R5383 Other fatigue: Secondary | ICD-10-CM | POA: Diagnosis not present

## 2018-04-30 DIAGNOSIS — R54 Age-related physical debility: Secondary | ICD-10-CM | POA: Diagnosis not present

## 2018-05-02 DIAGNOSIS — R54 Age-related physical debility: Secondary | ICD-10-CM | POA: Diagnosis not present

## 2018-05-02 DIAGNOSIS — Z86718 Personal history of other venous thrombosis and embolism: Secondary | ICD-10-CM | POA: Diagnosis not present

## 2018-05-02 DIAGNOSIS — I2510 Atherosclerotic heart disease of native coronary artery without angina pectoris: Secondary | ICD-10-CM | POA: Diagnosis not present

## 2018-05-02 DIAGNOSIS — R5383 Other fatigue: Secondary | ICD-10-CM | POA: Diagnosis not present

## 2018-05-02 DIAGNOSIS — Z7901 Long term (current) use of anticoagulants: Secondary | ICD-10-CM | POA: Diagnosis not present

## 2018-05-02 DIAGNOSIS — I480 Paroxysmal atrial fibrillation: Secondary | ICD-10-CM | POA: Diagnosis not present

## 2018-05-02 DIAGNOSIS — G3184 Mild cognitive impairment, so stated: Secondary | ICD-10-CM | POA: Diagnosis not present

## 2018-05-02 DIAGNOSIS — Z86711 Personal history of pulmonary embolism: Secondary | ICD-10-CM | POA: Diagnosis not present

## 2018-05-02 DIAGNOSIS — Z950 Presence of cardiac pacemaker: Secondary | ICD-10-CM | POA: Diagnosis not present

## 2018-05-03 ENCOUNTER — Other Ambulatory Visit: Payer: Self-pay | Admitting: Family Medicine

## 2018-05-06 ENCOUNTER — Ambulatory Visit: Payer: Medicare HMO | Admitting: Family Medicine

## 2018-05-06 DIAGNOSIS — Z86711 Personal history of pulmonary embolism: Secondary | ICD-10-CM | POA: Diagnosis not present

## 2018-05-06 DIAGNOSIS — R5383 Other fatigue: Secondary | ICD-10-CM | POA: Diagnosis not present

## 2018-05-06 DIAGNOSIS — Z86718 Personal history of other venous thrombosis and embolism: Secondary | ICD-10-CM | POA: Diagnosis not present

## 2018-05-06 DIAGNOSIS — Z950 Presence of cardiac pacemaker: Secondary | ICD-10-CM | POA: Diagnosis not present

## 2018-05-06 DIAGNOSIS — G3184 Mild cognitive impairment, so stated: Secondary | ICD-10-CM | POA: Diagnosis not present

## 2018-05-06 DIAGNOSIS — I2510 Atherosclerotic heart disease of native coronary artery without angina pectoris: Secondary | ICD-10-CM | POA: Diagnosis not present

## 2018-05-06 DIAGNOSIS — I480 Paroxysmal atrial fibrillation: Secondary | ICD-10-CM | POA: Diagnosis not present

## 2018-05-06 DIAGNOSIS — R54 Age-related physical debility: Secondary | ICD-10-CM | POA: Diagnosis not present

## 2018-05-06 DIAGNOSIS — Z7901 Long term (current) use of anticoagulants: Secondary | ICD-10-CM | POA: Diagnosis not present

## 2018-05-08 ENCOUNTER — Encounter: Payer: Self-pay | Admitting: Family Medicine

## 2018-05-08 ENCOUNTER — Ambulatory Visit (INDEPENDENT_AMBULATORY_CARE_PROVIDER_SITE_OTHER): Payer: Medicare HMO | Admitting: Family Medicine

## 2018-05-08 VITALS — BP 132/67 | HR 75 | Temp 96.7°F | Ht 68.0 in | Wt 138.4 lb

## 2018-05-08 DIAGNOSIS — E039 Hypothyroidism, unspecified: Secondary | ICD-10-CM

## 2018-05-08 DIAGNOSIS — Z950 Presence of cardiac pacemaker: Secondary | ICD-10-CM | POA: Diagnosis not present

## 2018-05-08 DIAGNOSIS — G3184 Mild cognitive impairment, so stated: Secondary | ICD-10-CM | POA: Diagnosis not present

## 2018-05-08 DIAGNOSIS — R5383 Other fatigue: Secondary | ICD-10-CM | POA: Diagnosis not present

## 2018-05-08 DIAGNOSIS — F0150 Vascular dementia without behavioral disturbance: Secondary | ICD-10-CM | POA: Diagnosis not present

## 2018-05-08 DIAGNOSIS — Z86711 Personal history of pulmonary embolism: Secondary | ICD-10-CM | POA: Diagnosis not present

## 2018-05-08 DIAGNOSIS — Z86718 Personal history of other venous thrombosis and embolism: Secondary | ICD-10-CM | POA: Diagnosis not present

## 2018-05-08 DIAGNOSIS — I2510 Atherosclerotic heart disease of native coronary artery without angina pectoris: Secondary | ICD-10-CM | POA: Diagnosis not present

## 2018-05-08 DIAGNOSIS — I480 Paroxysmal atrial fibrillation: Secondary | ICD-10-CM | POA: Diagnosis not present

## 2018-05-08 DIAGNOSIS — Z952 Presence of prosthetic heart valve: Secondary | ICD-10-CM | POA: Diagnosis not present

## 2018-05-08 DIAGNOSIS — R54 Age-related physical debility: Secondary | ICD-10-CM | POA: Diagnosis not present

## 2018-05-08 DIAGNOSIS — Z7901 Long term (current) use of anticoagulants: Secondary | ICD-10-CM | POA: Diagnosis not present

## 2018-05-08 NOTE — Progress Notes (Signed)
Subjective:  Patient ID: [name], male    DOB: 1928-10-22  Age: 84 y.o. MRN: [id number]  CC: 2 Week Follow-up   HPI BRYAN LADE presents for follow-up on his weakness and debility as well as to his thyroid condition.  His TSH had been elevated and he has been fatigued.  He has now 24-hour daily coverage for his wife so that he no longer has to stay awake with her.  Between the change of his thyroid medicine and the increased sleep he feels like he is getting somewhat better.  However this is limited by the fact that he has only been on the thyroid medicine a couple of weeks and he just got the 24-hour coverage for his wife 3 days ago.  He is now seeing ants crawling up and down the wall.  He knows that they are not actually there.  He does not have other hallucinations of note.  Depression screen [facility] 2/9 03/12/2018 12/26/2017 12/11/2017  Decreased Interest 0 0 0  Down, Depressed, Hopeless 0 0 0  PHQ - 2 Score 0 0 0  Altered sleeping - - -  Tired, decreased energy - - -  Change in appetite - - -  Feeling bad or failure about yourself  - - -  Trouble concentrating - - -  Moving slowly or fidgety/restless - - -  Suicidal thoughts - - -  PHQ-9 Score - - -  Some recent data might be hidden    History Zachrey has a past medical history of Aortic stenosis, CAD (coronary artery disease), Carotid stenosis, Cataract, CVA (cerebral infarction) (12/2010), DVT (deep venous thrombosis) (HCC), History of pulmonary embolism, History of rib fracture, History of shingles, Hyperlipidemia, Hypothyroidism, Pacemaker  Biotronik, Personal history of subdural hematoma, S/P cholecystectomy (2005), S/P shoulder surgery, Stroke (HCC) (2012), and Syncope.   He has a past surgical history that includes Aortic valve replacement (1985); Cardiac catheterization; Vascular surgery; Brain surgery; permanent pacemaker insertion (N/A, 08/01/2012); and Eye surgery.   His family history includes Arthritis in his  sister; Cancer in his father; Coronary artery disease in his other; Heart attack in his brother, brother, and father; Heart disease in his brother and father; Stroke in his mother.He reports that he has never smoked. He has never used smokeless tobacco. He reports that he does not drink alcohol or use drugs.    ROS Review of Systems  Constitutional: Positive for fatigue. Negative for fever.  HENT: Negative.   Respiratory: Negative for shortness of breath.   Cardiovascular: Negative for chest pain.  Musculoskeletal: Negative for arthralgias.  Skin: Negative for rash.  Neurological: Positive for weakness. Negative for tremors and syncope.    Objective:  BP 132/67   Pulse 75   Temp (!) 96.7 F (35.9 C) (Oral)   Ht 5\' 8"  (1.727 m)   Wt 138 lb 6 oz (62.8 kg)   BMI 21.04 kg/m   BP Readings from Last 3 Encounters:  05/08/18 132/67  04/22/18 (!) 116/57  03/12/18 112/69    Wt Readings from Last 3 Encounters:  05/08/18 138 lb 6 oz (62.8 kg)  04/22/18 136 lb 2 oz (61.7 kg)  03/12/18 137 lb 8 oz (62.4 kg)     Physical Exam  Constitutional: He is oriented to person, place, and time. He appears well-developed and well-nourished.  Very frail appearing  HENT:  Head: Normocephalic and atraumatic.  Right Ear: External ear normal.  Left Ear: External ear normal.  Nose: Nose normal.  Mouth/Throat: No posterior oropharyngeal erythema.  Eyes: Conjunctivae and EOM are normal.  Neck: Normal range of motion. Neck supple.  Cardiovascular: Normal rate and regular rhythm.  No murmur heard. Pulmonary/Chest: Breath sounds normal. No respiratory distress.  Musculoskeletal: Normal range of motion. He exhibits no edema or tenderness.  Gait is slow and stooped  Neurological: He is alert and oriented to person, place, and time.  Skin: Skin is warm and dry.  Psychiatric: He has a normal mood and affect.  Vitals reviewed.     Assessment & Plan:   Orval was seen today for 2 week  follow-up.  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 Maffei maintain his multivitamin, docusate sodium, aspirin EC, dronedarone, latanoprost, tamsulosin, potassium chloride, pantoprazole, simvastatin, furosemide, levothyroxine, and COUMADIN.  Allergies as of 05/08/2018      Reactions   Ivp Dye [iodinated Diagnostic Agents] Swelling   Swelling   Codeine Nausea Only   Nausea   Sulfonamide Derivatives Hives, Itching   Hives itching      Medication List        Accurate as of 05/08/18  4:57 PM. Always use your most recent med list.          aspirin EC 81 MG tablet Take 1 tablet (81 mg total) by mouth daily.   COUMADIN 3 MG tablet Generic drug:  warfarin Take as directed by the anticoagulation clinic. If you are unsure how to take this medication, talk to your nurse or doctor. Original instructions:  TAKE 1 DAILY EXCEPT 1/2 TABLET ON MONDAY AND FRIDAY   docusate sodium 100 MG capsule Commonly known as:  COLACE Take 100 mg by mouth daily.   dronedarone 400 MG tablet Commonly known as:  MULTAQ Take 1 tablet (400 mg total) by mouth 2 (two) times daily with a meal.   furosemide 20 MG tablet Commonly known as:  LASIX TAKE 1 TABLET BY MOUTH EVERY DAY   latanoprost 0.005 % ophthalmic solution Commonly known as:  XALATAN INSTILL 1 DROP INTO RIGHT EYE AT BEDTIME. BEGIN NEW BOTTLE EVERY 42 DAYS   levothyroxine 88 MCG tablet Commonly known as:  SYNTHROID, LEVOTHROID Take 1 tablet (88 mcg total) by mouth daily.   multivitamin tablet Take 1 tablet by mouth daily.   pantoprazole 40 MG tablet Commonly known as:  PROTONIX TAKE 1 TABLET EVERY DAY   potassium chloride 10 MEQ tablet Commonly known as:  K-DUR,KLOR-CON TAKE 1 TABLET (10 MEQ TOTAL) BY MOUTH EVERY MORNING.   simvastatin 10 MG tablet Commonly known as:   ZOCOR Take 1 tablet (10 mg total) by mouth at bedtime.   tamsulosin 0.4 MG Caps capsule Commonly known as:  FLOMAX TAKE 1 CAPSULE (0.4 MG TOTAL) BY MOUTH DAILY AFTER BREAKFAST.      Anticoagulation Episode Summary    Current INR goal:   2.5-3.5  TTR:   33.8 % (5.1 y)  Next INR check:   02/15/2018  INR from last check:     Most recent INR:    2.3! (04/22/2018)  Weekly max warfarin dose:   40 mg  Target end date:   Indefinite  INR check location:     Preferred lab:     Send INR reminders to:   [facility]   Indications   AF (paroxysmal atrial fibrillation) (HCC) [I48.0] S/P AVR (aortic valve replacement) [Z95.2]  Comments:         Anticoagulation Care Providers    Provider Role Specialty Phone number   Chari Manning, PharmD Referring Pharmacist [phone]       Follow-up: Return in about 2 weeks (around 05/22/2018).  [name], M.D.

## 2018-05-10 DIAGNOSIS — I2510 Atherosclerotic heart disease of native coronary artery without angina pectoris: Secondary | ICD-10-CM | POA: Diagnosis not present

## 2018-05-10 DIAGNOSIS — Z950 Presence of cardiac pacemaker: Secondary | ICD-10-CM | POA: Diagnosis not present

## 2018-05-10 DIAGNOSIS — Z7901 Long term (current) use of anticoagulants: Secondary | ICD-10-CM | POA: Diagnosis not present

## 2018-05-10 DIAGNOSIS — I480 Paroxysmal atrial fibrillation: Secondary | ICD-10-CM | POA: Diagnosis not present

## 2018-05-10 DIAGNOSIS — R5383 Other fatigue: Secondary | ICD-10-CM | POA: Diagnosis not present

## 2018-05-10 DIAGNOSIS — Z86711 Personal history of pulmonary embolism: Secondary | ICD-10-CM | POA: Diagnosis not present

## 2018-05-10 DIAGNOSIS — Z86718 Personal history of other venous thrombosis and embolism: Secondary | ICD-10-CM | POA: Diagnosis not present

## 2018-05-10 DIAGNOSIS — G3184 Mild cognitive impairment, so stated: Secondary | ICD-10-CM | POA: Diagnosis not present

## 2018-05-10 DIAGNOSIS — R54 Age-related physical debility: Secondary | ICD-10-CM | POA: Diagnosis not present

## 2018-05-14 DIAGNOSIS — G3184 Mild cognitive impairment, so stated: Secondary | ICD-10-CM | POA: Diagnosis not present

## 2018-05-14 DIAGNOSIS — R5383 Other fatigue: Secondary | ICD-10-CM | POA: Diagnosis not present

## 2018-05-14 DIAGNOSIS — R54 Age-related physical debility: Secondary | ICD-10-CM | POA: Diagnosis not present

## 2018-05-14 DIAGNOSIS — Z86711 Personal history of pulmonary embolism: Secondary | ICD-10-CM | POA: Diagnosis not present

## 2018-05-14 DIAGNOSIS — Z86718 Personal history of other venous thrombosis and embolism: Secondary | ICD-10-CM | POA: Diagnosis not present

## 2018-05-14 DIAGNOSIS — I480 Paroxysmal atrial fibrillation: Secondary | ICD-10-CM | POA: Diagnosis not present

## 2018-05-14 DIAGNOSIS — I2510 Atherosclerotic heart disease of native coronary artery without angina pectoris: Secondary | ICD-10-CM | POA: Diagnosis not present

## 2018-05-14 DIAGNOSIS — Z950 Presence of cardiac pacemaker: Secondary | ICD-10-CM | POA: Diagnosis not present

## 2018-05-14 DIAGNOSIS — Z7901 Long term (current) use of anticoagulants: Secondary | ICD-10-CM | POA: Diagnosis not present

## 2018-05-14 NOTE — Telephone Encounter (Signed)
Patient seen since phone call.  This encounter will now be closed 

## 2018-05-17 DIAGNOSIS — Z7901 Long term (current) use of anticoagulants: Secondary | ICD-10-CM | POA: Diagnosis not present

## 2018-05-17 DIAGNOSIS — Z86711 Personal history of pulmonary embolism: Secondary | ICD-10-CM | POA: Diagnosis not present

## 2018-05-17 DIAGNOSIS — Z86718 Personal history of other venous thrombosis and embolism: Secondary | ICD-10-CM | POA: Diagnosis not present

## 2018-05-17 DIAGNOSIS — Z950 Presence of cardiac pacemaker: Secondary | ICD-10-CM | POA: Diagnosis not present

## 2018-05-17 DIAGNOSIS — R5383 Other fatigue: Secondary | ICD-10-CM | POA: Diagnosis not present

## 2018-05-17 DIAGNOSIS — G3184 Mild cognitive impairment, so stated: Secondary | ICD-10-CM | POA: Diagnosis not present

## 2018-05-17 DIAGNOSIS — I480 Paroxysmal atrial fibrillation: Secondary | ICD-10-CM | POA: Diagnosis not present

## 2018-05-17 DIAGNOSIS — R54 Age-related physical debility: Secondary | ICD-10-CM | POA: Diagnosis not present

## 2018-05-17 DIAGNOSIS — I2510 Atherosclerotic heart disease of native coronary artery without angina pectoris: Secondary | ICD-10-CM | POA: Diagnosis not present

## 2018-05-21 ENCOUNTER — Ambulatory Visit (INDEPENDENT_AMBULATORY_CARE_PROVIDER_SITE_OTHER): Payer: Medicare HMO

## 2018-05-21 DIAGNOSIS — Z86711 Personal history of pulmonary embolism: Secondary | ICD-10-CM | POA: Diagnosis not present

## 2018-05-21 DIAGNOSIS — G3184 Mild cognitive impairment, so stated: Secondary | ICD-10-CM | POA: Diagnosis not present

## 2018-05-21 DIAGNOSIS — Z950 Presence of cardiac pacemaker: Secondary | ICD-10-CM | POA: Diagnosis not present

## 2018-05-21 DIAGNOSIS — R5383 Other fatigue: Secondary | ICD-10-CM

## 2018-05-21 DIAGNOSIS — Z86718 Personal history of other venous thrombosis and embolism: Secondary | ICD-10-CM | POA: Diagnosis not present

## 2018-05-21 DIAGNOSIS — R54 Age-related physical debility: Secondary | ICD-10-CM

## 2018-05-21 DIAGNOSIS — I480 Paroxysmal atrial fibrillation: Secondary | ICD-10-CM

## 2018-05-21 DIAGNOSIS — Z7901 Long term (current) use of anticoagulants: Secondary | ICD-10-CM | POA: Diagnosis not present

## 2018-05-21 DIAGNOSIS — I2510 Atherosclerotic heart disease of native coronary artery without angina pectoris: Secondary | ICD-10-CM | POA: Diagnosis not present

## 2018-05-24 DIAGNOSIS — R5383 Other fatigue: Secondary | ICD-10-CM | POA: Diagnosis not present

## 2018-05-24 DIAGNOSIS — I2510 Atherosclerotic heart disease of native coronary artery without angina pectoris: Secondary | ICD-10-CM | POA: Diagnosis not present

## 2018-05-24 DIAGNOSIS — I480 Paroxysmal atrial fibrillation: Secondary | ICD-10-CM | POA: Diagnosis not present

## 2018-05-24 DIAGNOSIS — Z950 Presence of cardiac pacemaker: Secondary | ICD-10-CM | POA: Diagnosis not present

## 2018-05-24 DIAGNOSIS — R54 Age-related physical debility: Secondary | ICD-10-CM | POA: Diagnosis not present

## 2018-05-24 DIAGNOSIS — Z86711 Personal history of pulmonary embolism: Secondary | ICD-10-CM | POA: Diagnosis not present

## 2018-05-24 DIAGNOSIS — Z86718 Personal history of other venous thrombosis and embolism: Secondary | ICD-10-CM | POA: Diagnosis not present

## 2018-05-24 DIAGNOSIS — G3184 Mild cognitive impairment, so stated: Secondary | ICD-10-CM | POA: Diagnosis not present

## 2018-05-24 DIAGNOSIS — Z7901 Long term (current) use of anticoagulants: Secondary | ICD-10-CM | POA: Diagnosis not present

## 2018-05-28 ENCOUNTER — Encounter: Payer: Self-pay | Admitting: Family Medicine

## 2018-05-28 ENCOUNTER — Ambulatory Visit (INDEPENDENT_AMBULATORY_CARE_PROVIDER_SITE_OTHER): Payer: Medicare HMO | Admitting: Family Medicine

## 2018-05-28 VITALS — BP 97/56 | HR 87 | Temp 98.2°F | Ht 68.0 in | Wt 138.1 lb

## 2018-05-28 DIAGNOSIS — R54 Age-related physical debility: Secondary | ICD-10-CM | POA: Diagnosis not present

## 2018-05-28 DIAGNOSIS — R5383 Other fatigue: Secondary | ICD-10-CM | POA: Diagnosis not present

## 2018-05-28 DIAGNOSIS — G3184 Mild cognitive impairment, so stated: Secondary | ICD-10-CM | POA: Diagnosis not present

## 2018-05-28 DIAGNOSIS — Z86711 Personal history of pulmonary embolism: Secondary | ICD-10-CM | POA: Diagnosis not present

## 2018-05-28 DIAGNOSIS — Z86718 Personal history of other venous thrombosis and embolism: Secondary | ICD-10-CM | POA: Diagnosis not present

## 2018-05-28 DIAGNOSIS — I480 Paroxysmal atrial fibrillation: Secondary | ICD-10-CM | POA: Diagnosis not present

## 2018-05-28 DIAGNOSIS — Z950 Presence of cardiac pacemaker: Secondary | ICD-10-CM | POA: Diagnosis not present

## 2018-05-28 DIAGNOSIS — Z7901 Long term (current) use of anticoagulants: Secondary | ICD-10-CM | POA: Diagnosis not present

## 2018-05-28 DIAGNOSIS — I2510 Atherosclerotic heart disease of native coronary artery without angina pectoris: Secondary | ICD-10-CM | POA: Diagnosis not present

## 2018-05-28 LAB — COAGUCHEK XS/INR WAIVED
INR: 2.1 — AB (ref 0.9–1.1)
Prothrombin Time: 25.6 s

## 2018-05-28 MED ORDER — NABUMETONE 500 MG PO TABS: 500.0000 mg | ORAL_TABLET | Freq: Two times a day (BID) | ORAL | 2 refills | Status: DC

## 2018-06-01 ENCOUNTER — Encounter: Payer: Self-pay | Admitting: Family Medicine

## 2018-06-01 NOTE — Progress Notes (Signed)
Subjective:  Patient ID: [name], male    DOB: [birth year]  Age: 84 y.o. MRN: [id number]  CC: Atrial Fibrillation (pt here today for INR)   HPI [name] presents for Patient in for follow-up of atrial fibrillation. Patient denies any recent bouts of chest pain or palpitations. Additionally, patient is taking anticoagulants. Patient denies any recent excessive bleeding episodes including epistaxis, bleeding from the gums, genitalia, rectal bleeding or hematuria. Additionally there has been no excessive bruising.  Depression screen [facility] 2/9 03/12/2018 12/26/2017 12/11/2017  Decreased Interest 0 0 0  Down, Depressed, Hopeless 0 0 0  PHQ - 2 Score 0 0 0  Altered sleeping - - -  Tired, decreased energy - - -  Change in appetite - - -  Feeling bad or failure about yourself  - - -  Trouble concentrating - - -  Moving slowly or fidgety/restless - - -  Suicidal thoughts - - -  PHQ-9 Score - - -  Some recent data might be hidden    History [name] has a past medical history of Aortic stenosis, CAD (coronary artery disease), Carotid stenosis, Cataract, CVA (cerebral infarction) (12/2010), DVT (deep venous thrombosis) (HCC), History of pulmonary embolism, History of rib fracture, History of shingles, Hyperlipidemia, Hypothyroidism, Pacemaker  Biotronik, Personal history of subdural hematoma, S/P cholecystectomy (2005), S/P shoulder surgery, Stroke (HCC) (2012), and Syncope.   He has a past surgical history that includes Aortic valve replacement (1985); Cardiac catheterization; Vascular surgery; Brain surgery; permanent pacemaker insertion (N/A, 08/01/2012); and Eye surgery.   His family history includes Arthritis in his sister; Cancer in his father; Coronary artery disease in his other; Heart attack in his brother, brother, and father; Heart disease in his brother and father; Stroke in his mother.He reports that he has never smoked. He has never used smokeless tobacco. He reports that  he does not drink alcohol or use drugs.    ROS Review of Systems  Constitutional: Negative for fever.  Respiratory: Negative for shortness of breath.   Cardiovascular: Negative for chest pain.  Musculoskeletal: Negative for arthralgias.  Skin: Negative for rash.    Objective:  BP (!) 97/56   Pulse 87   Temp 98.2 F (36.8 C) (Oral)   Ht 5\' 8"  (1.727 m)   Wt 138 lb 2 oz (62.7 kg)   BMI 21.00 kg/m   BP Readings from Last 3 Encounters:  05/28/18 (!) 97/56  05/08/18 132/67  04/22/18 (!) 116/57    Wt Readings from Last 3 Encounters:  05/28/18 138 lb 2 oz (62.7 kg)  05/08/18 138 lb 6 oz (62.8 kg)  04/22/18 136 lb 2 oz (61.7 kg)     Physical Exam  Constitutional: He appears well-developed and well-nourished.  HENT:  Head: Normocephalic and atraumatic.  Right Ear: Tympanic membrane and external ear normal. No decreased hearing is noted.  Left Ear: Tympanic membrane and external ear normal. No decreased hearing is noted.  Mouth/Throat: No oropharyngeal exudate or posterior oropharyngeal erythema.  Eyes: Pupils are equal, round, and reactive to light.  Neck: Normal range of motion. Neck supple.  Cardiovascular: Normal rate and regular rhythm.  No murmur heard. Pulmonary/Chest: Breath sounds normal. No respiratory distress.  Abdominal: Soft. Bowel sounds are normal. He exhibits no mass. There is no tenderness.  Vitals reviewed.     Assessment & Plan:   [name] was seen today for atrial fibrillation.  Diagnoses and all orders for this visit:  Long term current use of anticoagulant -  CoaguChek XS/INR Waived  Other orders -     nabumetone (RELAFEN) 500 MG tablet; Take 1 tablet (500 mg total) by mouth 2 (two) times daily. For arthritis pain       I am having [name] start on nabumetone. I am also having him maintain his multivitamin, docusate sodium, aspirin EC, dronedarone, latanoprost, tamsulosin, potassium chloride, pantoprazole, simvastatin,  furosemide, levothyroxine, and COUMADIN.  Allergies as of 05/28/2018      Reactions   Ivp Dye [iodinated Diagnostic Agents] Swelling   Swelling   Codeine Nausea Only   Nausea   Sulfonamide Derivatives Hives, Itching   Hives itching      Medication List        Accurate as of 05/28/18 11:59 PM. Always use your most recent med list.          aspirin EC 81 MG tablet Take 1 tablet (81 mg total) by mouth daily.   COUMADIN 3 MG tablet Generic drug:  warfarin Take as directed by the anticoagulation clinic. If you are unsure how to take this medication, talk to your nurse or doctor. Original instructions:  TAKE 1 DAILY EXCEPT 1/2 TABLET ON MONDAY AND FRIDAY   docusate sodium 100 MG capsule Commonly known as:  COLACE Take 100 mg by mouth daily.   dronedarone 400 MG tablet Commonly known as:  MULTAQ Take 1 tablet (400 mg total) by mouth 2 (two) times daily with a meal.   furosemide 20 MG tablet Commonly known as:  LASIX TAKE 1 TABLET BY MOUTH EVERY DAY   latanoprost 0.005 % ophthalmic solution Commonly known as:  XALATAN INSTILL 1 DROP INTO RIGHT EYE AT BEDTIME. BEGIN NEW BOTTLE EVERY 42 DAYS   levothyroxine 88 MCG tablet Commonly known as:  SYNTHROID, LEVOTHROID Take 1 tablet (88 mcg total) by mouth daily.   multivitamin tablet Take 1 tablet by mouth daily.   nabumetone 500 MG tablet Commonly known as:  RELAFEN Take 1 tablet (500 mg total) by mouth 2 (two) times daily. For arthritis pain   pantoprazole 40 MG tablet Commonly known as:  PROTONIX TAKE 1 TABLET EVERY DAY   potassium chloride 10 MEQ tablet Commonly known as:  K-DUR,KLOR-CON TAKE 1 TABLET (10 MEQ TOTAL) BY MOUTH EVERY MORNING.   simvastatin 10 MG tablet Commonly known as:  ZOCOR Take 1 tablet (10 mg total) by mouth at bedtime.   tamsulosin 0.4 MG Caps capsule Commonly known as:  FLOMAX TAKE 1 CAPSULE (0.4 MG TOTAL) BY MOUTH DAILY AFTER BREAKFAST.        Follow-up: Return in about 1 month  (around 06/28/2018).  [name], M.D.

## 2018-06-24 ENCOUNTER — Encounter: Payer: Self-pay | Admitting: Family Medicine

## 2018-06-24 ENCOUNTER — Ambulatory Visit (INDEPENDENT_AMBULATORY_CARE_PROVIDER_SITE_OTHER): Payer: Medicare HMO | Admitting: Family Medicine

## 2018-06-24 VITALS — BP 122/70 | HR 73 | Temp 98.4°F | Ht 68.0 in | Wt 142.0 lb

## 2018-06-24 DIAGNOSIS — G8929 Other chronic pain: Secondary | ICD-10-CM

## 2018-06-24 DIAGNOSIS — M25511 Pain in right shoulder: Secondary | ICD-10-CM | POA: Diagnosis not present

## 2018-06-24 DIAGNOSIS — Z7901 Long term (current) use of anticoagulants: Secondary | ICD-10-CM | POA: Diagnosis not present

## 2018-06-24 MED ORDER — MELOXICAM 7.5 MG PO TABS: 7.5000 mg | ORAL_TABLET | Freq: Every day | ORAL | 1 refills | Status: DC

## 2018-06-24 NOTE — Progress Notes (Signed)
Chief Complaint  Patient presents with  . Arm Pain    pt here today c/o right shoulder pain and both hands cramping     HPI  Patient presents today for itching caused by Relafen.  Now having pain again in the shoulder after having to stop the Relafen/nabumetone.  Additionally they are concerned that that he has cramping in the hands his fingers were so tightly flexed this morning that he had to work them open.  They are concerned that perhaps the Relafen also caused his INR to go up today.  PMH: Smoking status noted ROS: Per HPI  Objective: BP 122/70   Pulse 73   Temp 98.4 F (36.9 C) (Oral)   Ht 5\' 8"  (1.727 m)   Wt 142 lb (64.4 kg)   BMI 21.59 kg/m  Gen: NAD, alert, cooperative with exam HEENT: NCAT, EOMI, PERRL CV: RRR, good S1/S2, no murmur Resp: CTABL, no wheezes, non-labored Abd: SNTND, BS present, no guarding or organomegaly Ext: No edema, warm Neuro: Alert and oriented, No gross deficits INR: 4.8 Assessment and plan:  1. Long term current use of anticoagulant   2. Chronic right shoulder pain     Meds ordered this encounter  Medications  . meloxicam (MOBIC) 7.5 MG tablet    Sig: Take 1 tablet (7.5 mg total) by mouth daily.    Dispense:  30 tablet    Refill:  1    1. Long term current use of anticoagulant   2. Chronic right shoulder pain     Meds ordered this encounter  Medications  . meloxicam (MOBIC) 7.5 MG tablet    Sig: Take 1 tablet (7.5 mg total) by mouth daily.    Dispense:  30 tablet    Refill:  1    Orders Placed This Encounter  Procedures  . CoaguChek XS/INR Waived    Follow up 4 days.  Hold Coumadin for the next 2 days.  One half dose only the following day and follow-up for the next day.  Discontinue the nabumetone due to itch and the simvastatin due to possibility of it contributing to his muscle and joint pain and lack of therapeutic goal.  [name], MD   Follow up 3 days  [name], MD

## 2018-06-25 ENCOUNTER — Ambulatory Visit: Payer: Medicare HMO | Admitting: Family Medicine

## 2018-06-25 LAB — COAGUCHEK XS/INR WAIVED
INR: 4.8 — AB (ref 0.9–1.1)
PROTHROMBIN TIME: 57.4 s

## 2018-06-27 ENCOUNTER — Other Ambulatory Visit: Payer: Self-pay | Admitting: Family Medicine

## 2018-06-28 ENCOUNTER — Encounter: Payer: Self-pay | Admitting: Family Medicine

## 2018-06-28 ENCOUNTER — Ambulatory Visit: Payer: Medicare HMO | Admitting: Family Medicine

## 2018-06-28 ENCOUNTER — Ambulatory Visit (INDEPENDENT_AMBULATORY_CARE_PROVIDER_SITE_OTHER): Payer: Medicare HMO | Admitting: Family Medicine

## 2018-06-28 VITALS — BP 117/66 | HR 84 | Temp 98.6°F | Ht 68.0 in | Wt 143.4 lb

## 2018-06-28 DIAGNOSIS — Z7901 Long term (current) use of anticoagulants: Secondary | ICD-10-CM | POA: Diagnosis not present

## 2018-06-28 LAB — COAGUCHEK XS/INR WAIVED
INR: 1.8 — ABNORMAL HIGH (ref 0.9–1.1)
PROTHROMBIN TIME: 21.3 s

## 2018-06-28 NOTE — Progress Notes (Signed)
Chief Complaint  Patient presents with  . Follow-up    pt here today for Protime/INR    HPI  Patient presents today for hands still weak. Right shoulder painful for all ROM especially abduction. Denies bleeding episodes. Off coumadin 2 days then took 1/2 tab yesterday. Son caught him climbing a ladder recently.  PMH: Smoking status noted ROS: Per HPI  Objective: BP 117/66   Pulse 84   Temp 98.6 F (37 C) (Oral)   Ht 5\' 8"  (1.727 m)   Wt 143 lb 6 oz (65 kg)   BMI 21.80 kg/m  Gen: NAD, alert, cooperative with exam HEENT: NCAT, EOMI, PERRL CV: RRR, good S1/S2, no murmur Resp: CTABL, no wheezes, non-labored Abd: SNTND, BS present, no guarding or organomegaly Ext: No edema, warm Neuro: Alert and oriented, No gross deficits INR=1.8 Assessment and plan:  1. Long term current use of anticoagulant    Resume usual dose of coumadin. Stay off Ladders! Okay to start meloxicam   Orders Placed This Encounter  Procedures  . CoaguChek XS/INR Waived    Follow up 1 week [name], MD

## 2018-06-28 NOTE — Patient Instructions (Signed)
Resume usual dose of coumadin. Stay off Ladders! Okay to start meloxicam

## 2018-07-08 ENCOUNTER — Ambulatory Visit (INDEPENDENT_AMBULATORY_CARE_PROVIDER_SITE_OTHER): Payer: Medicare HMO | Admitting: Family Medicine

## 2018-07-08 ENCOUNTER — Encounter: Payer: Self-pay | Admitting: Family Medicine

## 2018-07-08 VITALS — BP 128/65 | HR 87 | Temp 97.9°F | Ht 68.0 in | Wt 147.2 lb

## 2018-07-08 DIAGNOSIS — Z952 Presence of prosthetic heart valve: Secondary | ICD-10-CM

## 2018-07-08 DIAGNOSIS — I480 Paroxysmal atrial fibrillation: Secondary | ICD-10-CM

## 2018-07-08 DIAGNOSIS — Z7901 Long term (current) use of anticoagulants: Secondary | ICD-10-CM | POA: Diagnosis not present

## 2018-07-08 LAB — COAGUCHEK XS/INR WAIVED
INR: 4.3 — ABNORMAL HIGH (ref 0.9–1.1)
PROTHROMBIN TIME: 51.4 s

## 2018-07-08 LAB — PROTIME-INR: INR: 4.3 — AB (ref <=1.1)

## 2018-07-08 MED ORDER — WARFARIN SODIUM 2 MG PO TABS
2.0000 mg | ORAL_TABLET | Freq: Every day | ORAL | 3 refills | Status: DC
Start: 2018-07-08 — End: 2018-07-22

## 2018-07-08 NOTE — Progress Notes (Signed)
Chief Complaint  Patient presents with  . Atrial Fibrillation    pt here today for f/u on his PROTIME    HPI  Patient presents today for recheck of INR. It was doing well until meloxicam and before that, nabumetone were added. Son wants to DC. No bleeding this week. No sign of CVA. No chest pain.  PMH: Smoking status noted ROS: Per HPI  Objective: BP 128/65   Pulse 87   Temp 97.9 F (36.6 C) (Oral)   Ht 5\' 8"  (1.727 m)   Wt 147 lb 4 oz (66.8 kg)   BMI 22.39 kg/m  Gen: NAD, alert, cooperative with exam HEENT: NCAT, EOMI, PERRL CV: RRR, good S1/S2, no murmur Resp: CTABL, no wheezes, non-labored Ext: No edema, warm Neuro: Alert and oriented, No gross deficits  Assessment and plan:  1. Long term current use of anticoagulant   2. AF (paroxysmal atrial fibrillation) (HCC)   3. S/P AVR (aortic valve replacement)     Pt. Concerned about the cost of the changed dose. Will check with pharmacy. If cost is prohibitive, will go with the 3 mg again and vary the daily dose.  Orders Placed This Encounter  Procedures  . CoaguChek XS/INR Waived  . Protime-INR    This external order was created through the Results Console.    Follow up 10 days (approx) [name], MD

## 2018-07-09 ENCOUNTER — Other Ambulatory Visit: Payer: Self-pay | Admitting: Family Medicine

## 2018-07-14 ENCOUNTER — Other Ambulatory Visit: Payer: Self-pay | Admitting: Internal Medicine

## 2018-07-16 ENCOUNTER — Telehealth: Payer: Self-pay | Admitting: Family Medicine

## 2018-07-16 ENCOUNTER — Other Ambulatory Visit: Payer: Self-pay | Admitting: Family Medicine

## 2018-07-16 MED ORDER — TRAMADOL HCL 50 MG PO TABS: 50.0000 mg | ORAL_TABLET | Freq: Two times a day (BID) | ORAL | 0 refills | Status: AC

## 2018-07-16 NOTE — Telephone Encounter (Signed)
Pt. Right shoulder pain worse since DC of meloxicam. Very uncomfortable says son, [name]. Send substitute. I sent in Tramadol 50 mg BID to [facility]. WS

## 2018-07-16 NOTE — Telephone Encounter (Signed)
Requesting a call from Dr. Darlyn Read

## 2018-07-17 DIAGNOSIS — H04123 Dry eye syndrome of bilateral lacrimal glands: Secondary | ICD-10-CM | POA: Diagnosis not present

## 2018-07-17 DIAGNOSIS — H401113 Primary open-angle glaucoma, right eye, severe stage: Secondary | ICD-10-CM | POA: Diagnosis not present

## 2018-07-17 DIAGNOSIS — Z961 Presence of intraocular lens: Secondary | ICD-10-CM | POA: Diagnosis not present

## 2018-07-17 DIAGNOSIS — H401121 Primary open-angle glaucoma, left eye, mild stage: Secondary | ICD-10-CM | POA: Diagnosis not present

## 2018-07-17 DIAGNOSIS — H2589 Other age-related cataract: Secondary | ICD-10-CM | POA: Diagnosis not present

## 2018-07-18 ENCOUNTER — Ambulatory Visit (INDEPENDENT_AMBULATORY_CARE_PROVIDER_SITE_OTHER): Payer: Medicare HMO | Admitting: Internal Medicine

## 2018-07-18 ENCOUNTER — Encounter: Payer: Self-pay | Admitting: Internal Medicine

## 2018-07-18 VITALS — BP 110/62 | HR 70 | Ht 68.0 in | Wt 142.4 lb

## 2018-07-18 DIAGNOSIS — I480 Paroxysmal atrial fibrillation: Secondary | ICD-10-CM | POA: Diagnosis not present

## 2018-07-18 DIAGNOSIS — Z952 Presence of prosthetic heart valve: Secondary | ICD-10-CM

## 2018-07-18 DIAGNOSIS — Z950 Presence of cardiac pacemaker: Secondary | ICD-10-CM | POA: Diagnosis not present

## 2018-07-18 NOTE — Progress Notes (Signed)
Patient Care Team: Claretta Fraise, MD as PCP - General (Family Medicine) Clent Jacks, MD as Consulting Physician (Ophthalmology) Deboraha Sprang, MD as Consulting Physician (Cardiology)   HPI  [name] is a 84 y.o. male Seen in follow-up for carotid hypersensitivity and syncope. He is status post Biotronik CLS pacemaker implantation.  He has a history of mechanical aortic valve he takes aspirin and Coumadin. He has a history of a craniotomy following a subdural hematoma.  Echocardiogram 11/17 normal left ventricular function normal valve function DATE TEST EF   11/17 Echo   55-65 %   1/19 Echo   35-40 %         He was seen last time for bradycardia which is related to ventricular ectopy.   Antiarrhythmic drug suppression was undertaken.  LV dysfunction had also intervened it was not clear whether this is related to PVCs or ventricular pacing  He comes in today with complaints of pain in his hands and his right shoulder.  He called his son on Monday and told him he felt bad enough he went to lie down to die.  His hands are swollen.  His ankles have also been swollen he has been taking a diuretic for that.  They have backed off on the dose with recurrence of the lower extremity swelling. Recently resumed   Date Cr Hgb  1/18 1.36 11.7  1/19 1.39 11.0  6/19 1.30 11.2       Records and Results Reviewed   Past Medical History:  Diagnosis Date  . Aortic stenosis    St.Jude AVR in 1985; Echo 8/12: EF 50-55%, mild LVH, mech AV with mild AI, mean gradient 14 mmHg, mild to mod MR;  Echo 8/13: EF 60-65%, AVR ok with mean gradient 19 mmHg  . CAD (coronary artery disease)    Mild, nonobstructive at catheterization 5/01  . Carotid stenosis    Dopplers 8/13: RICA 81-01% and LICA 7-51% => followup 1 year  . Cataract   . CVA (cerebral infarction) 12/2010  . DVT (deep venous thrombosis) ([place])   . History of pulmonary embolism    post op DVT/PE;  s/p IVC filter  .  History of rib fracture   . History of shingles   . Hyperlipidemia   . Hypothyroidism   . Pacemaker  Biotronik   . Personal history of subdural hematoma    Requiring craniotomy  . S/P cholecystectomy 2005  . S/P shoulder surgery    left  . Stroke ([place]) 2012  . Syncope    + carotid sinus massage    Past Surgical History:  Procedure Laterality Date  . AORTIC VALVE REPLACEMENT  1985   St. Jude / mechanical  . BRAIN SURGERY    . CARDIAC CATHETERIZATION    . EYE SURGERY     bilateral cataract  . PERMANENT PACEMAKER INSERTION N/A 08/01/2012   Procedure: PERMANENT PACEMAKER INSERTION;  Surgeon: Deboraha Sprang, MD;  Location: [facility] CATH LAB;  Service: Cardiovascular;  Laterality: N/A;  . VASCULAR SURGERY      Current Outpatient Medications  Medication Sig Dispense Refill  . aspirin EC 81 MG tablet Take 1 tablet (81 mg total) by mouth daily.    . brimonidine-timolol (COMBIGAN) 0.2-0.5 % ophthalmic solution Place 1 drop into the right eye 2 (two) times daily.    Marland Kitchen docusate sodium (COLACE) 100 MG capsule Take 100 mg by mouth daily.     . dorzolamide (TRUSOPT) 2 %  ophthalmic solution Place 1 drop into the right eye 2 (two) times daily.    Marland Kitchen dronedarone (MULTAQ) 400 MG tablet Take 1 tablet (400 mg total) by mouth 2 (two) times daily with a meal. 60 tablet 5  . furosemide (LASIX) 20 MG tablet TAKE 1 TABLET BY MOUTH EVERY DAY 90 tablet 1  . latanoprost (XALATAN) 0.005 % ophthalmic solution Place 1 drop into both eyes at bedtime.    Marland Kitchen levothyroxine (SYNTHROID, LEVOTHROID) 88 MCG tablet TAKE 1 TABLET BY MOUTH EVERY DAY 30 tablet 0  . Multiple Vitamin (MULTIVITAMIN) tablet Take 1 tablet by mouth daily.      . pantoprazole (PROTONIX) 40 MG tablet TAKE 1 TABLET EVERY DAY 90 tablet 0  . potassium chloride (K-DUR,KLOR-CON) 10 MEQ tablet TAKE 1 TABLET (10 MEQ TOTAL) BY MOUTH EVERY MORNING. 90 tablet 0  . tamsulosin (FLOMAX) 0.4 MG CAPS capsule TAKE 1 CAPSULE (0.4 MG TOTAL) BY MOUTH DAILY AFTER  BREAKFAST. 90 capsule 0  . traMADol (ULTRAM) 50 MG tablet Take 1 tablet (50 mg total) by mouth 2 (two) times daily. 60 tablet 0  . warfarin (COUMADIN) 2 MG tablet Take 1 tablet (2 mg total) by mouth daily. 90 tablet 3   No current facility-administered medications for this visit.     Allergies  Allergen Reactions  . Ivp Dye [Iodinated Diagnostic Agents] Swelling    Swelling   . Nabumetone Hives  . Codeine Nausea Only    Nausea   . Sulfonamide Derivatives Hives and Itching    Hives itching       Review of Systems negative except from HPI and PMH  Physical Exam BP 110/62   Pulse 70   Ht _0  (1.727 m)   Wt 142 lb 6.4 oz (64.6 kg)   SpO2 97%   BMI 21.65 kg/m   Well developed and nourished in no acute distress HENT normal Neck supple with JVP-flat Clear Regular rate and rhythm, no murmurs or gallops Abd-soft with active BS No Clubbing cyanosis tr edema lower extremities.  Right and left hand are swollen.  Venous distention resolves at the level of the heart (lifting arm) Skin-warm and dry A & Oriented  Grossly normal sensory and motor function  ECG  Sinus with scant ectopy   Assessment and  Plan  Carotid sinus hypersensitivity   Sinus node dysfunction/chronotropic incompetence  Pacemaker-Biotronik  Hypertension  Heart failure acute-on chronic presumed diastolic  PVCs  Atrial fibrillation  Aortic valve replacement-mechanical    On Anticoagulation;  No bleeding issues   No sustained intercurrent atrial fibrillation or flutter  PVCs extinguished by multaq;    Not sure of cause of swelling of hands and aching shoulders  ? PMR  Will arrange CRP and ESR with PCP tomorrow  Will hold multaq as it is new.    Euvolemic continue current meds        Current medicines are reviewed at length with the patient today .  The patient does not  have concerns regarding medicines.

## 2018-07-18 NOTE — Patient Instructions (Signed)
Medication Instructions:  Your physician has recommended you make the following change in your medication:   1. Stop Mutaq. Dr Caryl Comes will discuss when to start this again during your next follow up visit.  Labwork: Your physician recommends that you return for lab work in: ESR and CRP   Testing/Procedures: None ordered.   Follow-Up: Your physician recommends that you schedule a follow-up appointment in:   4 months with Dr Caryl Comes  Any Other Special Instructions Will Be Listed Below (If Applicable).     If you need a refill on your cardiac medications before your next appointment, please call your pharmacy.

## 2018-07-19 ENCOUNTER — Ambulatory Visit (INDEPENDENT_AMBULATORY_CARE_PROVIDER_SITE_OTHER): Payer: Medicare HMO | Admitting: Family Medicine

## 2018-07-19 ENCOUNTER — Encounter: Payer: Self-pay | Admitting: Family Medicine

## 2018-07-19 ENCOUNTER — Other Ambulatory Visit: Payer: Self-pay | Admitting: Internal Medicine

## 2018-07-19 VITALS — BP 114/62 | HR 86 | Temp 97.0°F | Ht 68.0 in | Wt 141.0 lb

## 2018-07-19 DIAGNOSIS — I482 Chronic atrial fibrillation, unspecified: Secondary | ICD-10-CM | POA: Insufficient documentation

## 2018-07-19 DIAGNOSIS — Z7901 Long term (current) use of anticoagulants: Secondary | ICD-10-CM | POA: Diagnosis not present

## 2018-07-19 DIAGNOSIS — M159 Polyosteoarthritis, unspecified: Secondary | ICD-10-CM | POA: Insufficient documentation

## 2018-07-19 DIAGNOSIS — M150 Primary generalized (osteo)arthritis: Secondary | ICD-10-CM | POA: Diagnosis not present

## 2018-07-19 DIAGNOSIS — Z952 Presence of prosthetic heart valve: Secondary | ICD-10-CM | POA: Diagnosis not present

## 2018-07-19 DIAGNOSIS — I480 Paroxysmal atrial fibrillation: Secondary | ICD-10-CM | POA: Diagnosis not present

## 2018-07-19 DIAGNOSIS — Z950 Presence of cardiac pacemaker: Secondary | ICD-10-CM | POA: Diagnosis not present

## 2018-07-19 LAB — COAGUCHEK XS/INR WAIVED
INR: 6.4 (ref 0.9–1.1)
Prothrombin Time: 77.4 s

## 2018-07-19 NOTE — Progress Notes (Signed)
Chief Complaint  Patient presents with  . Recheck protime    Has been taking name brand coumadin per son    HPI  Patient presents today for recheck of INR.  He went off of his meloxicam thinking it was causing it to go up but unfortunately the INR is actually higher today.  He denies any signs of bleeding.  This includes hematochezia hematuria and epistaxis.  He continues to take brand only Coumadin.  He was taken off of the [facility] yesterday by cardiology.  His son is here and just a little exasperated by all the changes going on.  He tells me that he gave Mr. Mattaliano the tramadol due to his extreme pain a few days ago.  This made him so groggy again drunk acting that he had to discontinue it.  On the other hand the patient's pain was so severe that he just wanted to curl up and die just 3 days ago when the tramadol was prescribed.  Hopefully stopping the Multitak will help some with the pain as well.  His cardiologist also ordered a C-reactive protein and sed rate to check for inflammatory causes of his pain.  PMH: Smoking status noted ZOX:WRUEAV of Systems  Constitutional: Negative for activity change and appetite change.  HENT: Negative for congestion.   Cardiovascular: Negative for chest pain.  Gastrointestinal: Negative for abdominal distention and abdominal pain.  Genitourinary: Negative for hematuria.  Musculoskeletal: Positive for arthralgias and myalgias. Negative for joint swelling.  Psychiatric/Behavioral: Negative for sleep disturbance.     Objective: BP 114/62   Pulse 86   Temp (!) 97 F (36.1 C) (Oral)   Ht 5\' 8"  (1.727 m)   Wt 141 lb (64 kg)   BMI 21.44 kg/m  Gen: NAD, alert, cooperative with exam HEENT: NCAT, EOMI, PERRL CV: Irregular irregular rhythm, good S1/S2, no murmur Resp: CTABL, no wheezes, non-labored Abd: SNTND, BS present, no guarding or organomegaly Ext: No edema, warm Neuro: Alert and oriented, No gross deficits  Assessment and plan:  1.  Chronic atrial fibrillation (HCC)   2. Long term current use of anticoagulant   3. Aortic valve replacement   4. Primary osteoarthritis involving multiple joints     No orders of the defined types were placed in this encounter.   Orders Placed This Encounter  Procedures  . CoaguChek XS/INR Waived  . Protime-INR    Follow up as needed.  [name], MD

## 2018-07-19 NOTE — Patient Instructions (Signed)
Hold Coumadin on Friday and Saturday. Take 1/2 of 3mg  coumadin on Sunday and follow up Monday for a repeat protime

## 2018-07-20 LAB — PROTIME-INR
INR: 6.1 (ref 0.8–1.2)
Prothrombin Time: 54.4 s — ABNORMAL HIGH (ref 9.1–12.0)

## 2018-07-20 LAB — C-REACTIVE PROTEIN: CRP: 64 mg/L — AB (ref 0–10)

## 2018-07-20 LAB — SEDIMENTATION RATE: Sed Rate: 47 mm/hr — ABNORMAL HIGH (ref 0–30)

## 2018-07-22 ENCOUNTER — Ambulatory Visit (INDEPENDENT_AMBULATORY_CARE_PROVIDER_SITE_OTHER): Payer: Medicare HMO | Admitting: Family Medicine

## 2018-07-22 ENCOUNTER — Encounter: Payer: Self-pay | Admitting: Family Medicine

## 2018-07-22 VITALS — BP 117/63 | HR 81 | Temp 97.6°F | Ht 68.0 in | Wt 143.1 lb

## 2018-07-22 DIAGNOSIS — Z952 Presence of prosthetic heart valve: Secondary | ICD-10-CM | POA: Diagnosis not present

## 2018-07-22 DIAGNOSIS — Z7901 Long term (current) use of anticoagulants: Secondary | ICD-10-CM | POA: Diagnosis not present

## 2018-07-22 DIAGNOSIS — M150 Primary generalized (osteo)arthritis: Secondary | ICD-10-CM | POA: Diagnosis not present

## 2018-07-22 DIAGNOSIS — M159 Polyosteoarthritis, unspecified: Secondary | ICD-10-CM

## 2018-07-22 DIAGNOSIS — I480 Paroxysmal atrial fibrillation: Secondary | ICD-10-CM

## 2018-07-22 LAB — CUP PACEART INCLINIC DEVICE CHECK
Implantable Lead Implant Date: 20130919
Implantable Lead Location: 753859
Implantable Lead Location: 753860
Implantable Pulse Generator Implant Date: 20130919
MDC IDC LEAD IMPLANT DT: 20130919
MDC IDC SESS DTM: 20190909132030
Pulse Gen Serial Number: 66244569

## 2018-07-22 LAB — COAGUCHEK XS/INR WAIVED
INR: 3.1 — AB (ref 0.9–1.1)
PROTHROMBIN TIME: 37.2 s

## 2018-07-22 LAB — PROTIME-INR: INR: 3.1 — AB (ref 0.9–1.1)

## 2018-07-22 MED ORDER — METAXALONE 800 MG PO TABS: 400.0000 mg | ORAL_TABLET | Freq: Every day | ORAL | 30 refills | Status: DC

## 2018-07-22 NOTE — Patient Instructions (Addendum)
Description

## 2018-07-22 NOTE — Progress Notes (Signed)
Subjective:  Patient ID: [name], male    DOB: Oct 06, 1928  Age: 84 y.o. MRN: [id number]  CC: Follow-up (pt here today for Protime)    [name] presents for Recheck of INR. Was supra therapeutic 3 days ago. Denies bleeding. Took 1.5.mg of coumadin yesterday. None the previous 2 days. Denies chest pain, dyspnea. Pt. Says he isn't eating anything green lately.  Hands and right shoulder still very painful, stiff especially in the morning. Concern for INR led to DC of NSAID. Now pain has worsened. Tramadol caused too much grogginess.   Depression screen [facility] 2/9 07/19/2018 06/28/2018 03/12/2018  Decreased Interest 0 0 0  Down, Depressed, Hopeless 0 0 0  PHQ - 2 Score 0 0 0  Altered sleeping - - -  Tired, decreased energy - - -  Change in appetite - - -  Feeling bad or failure about yourself  - - -  Trouble concentrating - - -  Moving slowly or fidgety/restless - - -  Suicidal thoughts - - -  PHQ-9 Score - - -  Some recent data might be hidden    History [name] has a past medical history of Aortic stenosis, CAD (coronary artery disease), Carotid stenosis, Cataract, CVA (cerebral infarction) (12/2010), DVT (deep venous thrombosis) (HCC), History of pulmonary embolism, History of rib fracture, History of shingles, Hyperlipidemia, Hypothyroidism, Pacemaker  Biotronik, Personal history of subdural hematoma, S/P cholecystectomy (2005), S/P shoulder surgery, Stroke (HCC) (2012), and Syncope.   He has a past surgical history that includes Aortic valve replacement (1985); Cardiac catheterization; Vascular surgery; Brain surgery; permanent pacemaker insertion (N/A, 08/01/2012); and Eye surgery.   His family history includes Arthritis in his sister; Cancer in his father; Coronary artery disease in his other; Heart attack in his brother, brother, and father; Heart disease in his brother and father; Stroke in his mother.He reports that he has never smoked. He has never used smokeless  tobacco. He reports that he does not drink alcohol or use drugs.    ROS Review of Systems  Constitutional: Positive for fatigue. Negative for appetite change, chills and fever.  HENT: Negative.   Respiratory: Negative for cough and shortness of breath.   Cardiovascular: Negative for chest pain and palpitations.  Musculoskeletal: Positive for arthralgias (hands and right shoulder).  Skin: Negative for rash.    Objective:  BP 117/63   Pulse 81   Temp 97.6 F (36.4 C) (Oral)   Ht 5\' 8"  (1.727 m)   Wt 143 lb 2 oz (64.9 kg)   BMI 21.76 kg/m   BP Readings from Last 3 Encounters:  07/22/18 117/63  07/19/18 114/62  07/18/18 110/62    Wt Readings from Last 3 Encounters:  07/22/18 143 lb 2 oz (64.9 kg)  07/19/18 141 lb (64 kg)  07/18/18 142 lb 6.4 oz (64.6 kg)     Physical Exam  Constitutional: He is oriented to person, place, and time. He appears well-developed and well-nourished. No distress.  HENT:  Head: Normocephalic and atraumatic.  Eyes: Pupils are equal, round, and reactive to light. EOM are normal.  Neck: Normal range of motion.  Cardiovascular: Normal rate, regular rhythm and normal heart sounds.  No murmur heard. Pulmonary/Chest: Effort normal and breath sounds normal. No respiratory distress. He has no wheezes. He has no rales.  Abdominal: Soft. There is no tenderness.  Musculoskeletal: He exhibits edema (at PIP joints of hands) and tenderness (for extension passively of fingers).  Neurological: He is alert and oriented  to person, place, and time. He has normal reflexes.  Skin: Skin is warm and dry.  Psychiatric: He has a normal mood and affect. His behavior is normal. Judgment and thought content normal.      Assessment & Plan:   [name] was seen today for follow-up.  Diagnoses and all orders for this visit:  Primary osteoarthritis involving multiple joints  Long term current use of anticoagulant -     CoaguChek XS/INR Waived  AF (paroxysmal atrial  fibrillation) (HCC)  S/P AVR (aortic valve replacement)  Other orders -     metaxalone (SKELAXIN) 800 MG tablet; Take 0.5 tablets (400 mg total) by mouth at bedtime.   Unable to use NSAIDs due to coumadin therapy.    I have discontinued Remi Deter A. Okonski's warfarin. I am also having him start on metaxalone. Additionally, I am having him maintain his multivitamin, docusate sodium, aspirin EC, tamsulosin, potassium chloride, pantoprazole, furosemide, levothyroxine, traMADol, latanoprost, dorzolamide, and brimonidine-timolol.  Allergies as of 07/22/2018      Reactions   Ivp Dye [iodinated Diagnostic Agents] Swelling   Swelling   Nabumetone Hives   Codeine Nausea Only   Nausea   Sulfonamide Derivatives Hives, Itching   Hives itching      Medication List        Accurate as of 07/22/18 11:36 AM. Always use your most recent med list.          aspirin EC 81 MG tablet Take 1 tablet (81 mg total) by mouth daily.   COMBIGAN 0.2-0.5 % ophthalmic solution Generic drug:  brimonidine-timolol Place 1 drop into the right eye 2 (two) times daily.   docusate sodium 100 MG capsule Commonly known as:  COLACE Take 100 mg by mouth daily.   dorzolamide 2 % ophthalmic solution Commonly known as:  TRUSOPT Place 1 drop into the right eye 2 (two) times daily.   furosemide 20 MG tablet Commonly known as:  LASIX TAKE 1 TABLET BY MOUTH EVERY DAY   latanoprost 0.005 % ophthalmic solution Commonly known as:  XALATAN Place 1 drop into both eyes at bedtime.   levothyroxine 88 MCG tablet Commonly known as:  SYNTHROID, LEVOTHROID TAKE 1 TABLET BY MOUTH EVERY DAY   metaxalone 800 MG tablet Commonly known as:  SKELAXIN Take 0.5 tablets (400 mg total) by mouth at bedtime.   multivitamin tablet Take 1 tablet by mouth daily.   pantoprazole 40 MG tablet Commonly known as:  PROTONIX TAKE 1 TABLET EVERY DAY   potassium chloride 10 MEQ tablet Commonly known as:  K-DUR,KLOR-CON TAKE 1 TABLET  (10 MEQ TOTAL) BY MOUTH EVERY MORNING.   tamsulosin 0.4 MG Caps capsule Commonly known as:  FLOMAX TAKE 1 CAPSULE (0.4 MG TOTAL) BY MOUTH DAILY AFTER BREAKFAST.   traMADol 50 MG tablet Commonly known as:  ULTRAM Take 1 tablet (50 mg total) by mouth 2 (two) times daily.      Description              Follow-up: Return in about 1 week (around 07/29/2018).  [name], M.D.

## 2018-07-23 ENCOUNTER — Telehealth: Payer: Self-pay | Admitting: Family Medicine

## 2018-07-23 ENCOUNTER — Other Ambulatory Visit: Payer: Self-pay | Admitting: Family Medicine

## 2018-07-23 ENCOUNTER — Other Ambulatory Visit: Payer: Self-pay

## 2018-07-23 DIAGNOSIS — R601 Generalized edema: Secondary | ICD-10-CM

## 2018-07-23 MED ORDER — METHOCARBAMOL 500 MG PO TABS: 500.0000 mg | ORAL_TABLET | Freq: Every day | ORAL | 2 refills | Status: DC

## 2018-07-23 NOTE — Telephone Encounter (Signed)
I sent in the requested prescription 

## 2018-07-23 NOTE — Telephone Encounter (Signed)
Patient's son aware that Robaxin has been sent in instead

## 2018-07-23 NOTE — Telephone Encounter (Signed)
PT son states that he got a text from [facility] stating that the insurance is not covering metaxalone (SKELAXIN) 800 MG tablet and wants to know if there is an alternative

## 2018-07-24 ENCOUNTER — Ambulatory Visit: Payer: Medicare HMO | Admitting: Family Medicine

## 2018-07-29 ENCOUNTER — Ambulatory Visit (INDEPENDENT_AMBULATORY_CARE_PROVIDER_SITE_OTHER): Payer: Medicare HMO | Admitting: Family Medicine

## 2018-07-29 ENCOUNTER — Encounter: Payer: Self-pay | Admitting: Family Medicine

## 2018-07-29 VITALS — BP 98/57 | HR 75 | Temp 97.4°F | Ht 68.0 in | Wt 143.0 lb

## 2018-07-29 DIAGNOSIS — Z7901 Long term (current) use of anticoagulants: Secondary | ICD-10-CM | POA: Diagnosis not present

## 2018-07-29 DIAGNOSIS — I480 Paroxysmal atrial fibrillation: Secondary | ICD-10-CM | POA: Diagnosis not present

## 2018-07-29 DIAGNOSIS — Z952 Presence of prosthetic heart valve: Secondary | ICD-10-CM

## 2018-07-29 DIAGNOSIS — M2550 Pain in unspecified joint: Secondary | ICD-10-CM | POA: Diagnosis not present

## 2018-07-29 LAB — COAGUCHEK XS/INR WAIVED
INR: 3.7 — ABNORMAL HIGH (ref 0.9–1.1)
PROTHROMBIN TIME: 44.6 s

## 2018-07-29 LAB — PROTIME-INR: INR: 3.7 — AB (ref 0.9–1.1)

## 2018-07-29 MED ORDER — WARFARIN SODIUM 3 MG PO TABS: ORAL_TABLET | ORAL | 0 refills | Status: DC

## 2018-07-29 NOTE — Progress Notes (Signed)
Chief Complaint  Patient presents with  . Follow-up    pt here today for 1 week recheck of Protime    HPI  Patient presents today for recheck of INR. Up to 3.7. Pt. Son tells me that cardiology wanted it down around 2.5 since he had the subdural bleed 14 years ago. He had elevated ESR and CRP through cardiology. Plans follow up bloodwork here today based on my recommendation last week. Pain has calmed down some since adding the muscle relaxer.  PMH: Smoking status noted HKF:EXMDYJ of Systems  Constitutional: Negative for fever.  Respiratory: Negative for shortness of breath.   Cardiovascular: Negative for chest pain.  Musculoskeletal: Positive for arthralgias and myalgias. Negative for joint swelling.  Skin: Negative for rash.    Objective: BP (!) 98/57   Pulse 75   Temp (!) 97.4 F (36.3 C) (Oral)   Ht '5\' 8"'$  (1.727 m)   Wt 143 lb (64.9 kg)   BMI 21.74 kg/m  Gen: NAD, alert, cooperative with exam HEENT: NCAT, EOMI, PERRL CV: RRR, good S1/S2, no murmur Resp: CTABL, no wheezes, non-labored Abd: SNTND, BS present, no guarding or organomegaly Ext: No edema, warm Neuro: Alert and oriented, No gross deficits  Assessment and plan:  1. Long term current use of anticoagulant   2. AF (paroxysmal atrial fibrillation) ([place])   3. S/P AVR (aortic valve replacement)   4. Arthralgia, unspecified joint     Meds ordered this encounter  Medications  . warfarin (COUMADIN) 3 MG tablet    Sig: Take 1/2 tablet every other day, alternating with 1 tablet every other day    Dispense:  23 tablet    Refill:  0    Orders Placed This Encounter  Procedures  . CoaguChek XS/INR Waived  . CYCLIC CITRUL PEPTIDE ANTIBODY, IGG/IGA    Follow up 2 weeks.  Claretta Fraise, MD

## 2018-07-30 LAB — CYCLIC CITRUL PEPTIDE ANTIBODY, IGG/IGA: Cyclic Citrullin Peptide Ab: 8 units (ref 0–19)

## 2018-07-31 ENCOUNTER — Ambulatory Visit: Payer: Medicare HMO | Admitting: Physician Assistant

## 2018-08-01 ENCOUNTER — Other Ambulatory Visit: Payer: Self-pay | Admitting: Family Medicine

## 2018-08-03 ENCOUNTER — Other Ambulatory Visit: Payer: Self-pay | Admitting: Family Medicine

## 2018-08-05 LAB — ANA,IFA RA DIAG PNL W/RFLX TIT/PATN
ANA TITER 1: NEGATIVE
Cyclic Citrullin Peptide Ab: 7 units (ref 0–19)

## 2018-08-05 LAB — SPECIMEN STATUS REPORT

## 2018-08-07 DIAGNOSIS — H401121 Primary open-angle glaucoma, left eye, mild stage: Secondary | ICD-10-CM | POA: Diagnosis not present

## 2018-08-07 DIAGNOSIS — H401113 Primary open-angle glaucoma, right eye, severe stage: Secondary | ICD-10-CM | POA: Diagnosis not present

## 2018-08-09 ENCOUNTER — Telehealth: Payer: Self-pay | Admitting: Family Medicine

## 2018-08-09 NOTE — Telephone Encounter (Signed)
Needs note stating that he is not mentally or physically able to handel his affairs. Please advise.

## 2018-08-09 NOTE — Telephone Encounter (Signed)
Letter printed for Dr Darlyn Read to sign and left message stating it wouldn't be ready till MOnday.

## 2018-08-09 NOTE — Telephone Encounter (Signed)
Please  write and I will sign. Thanks, WS 

## 2018-08-12 ENCOUNTER — Ambulatory Visit (INDEPENDENT_AMBULATORY_CARE_PROVIDER_SITE_OTHER): Payer: Medicare HMO | Admitting: Family Medicine

## 2018-08-12 ENCOUNTER — Other Ambulatory Visit: Payer: Self-pay | Admitting: Family Medicine

## 2018-08-12 ENCOUNTER — Encounter: Payer: Self-pay | Admitting: Family Medicine

## 2018-08-12 VITALS — BP 131/64 | HR 69 | Temp 97.1°F | Ht 68.0 in | Wt 140.0 lb

## 2018-08-12 DIAGNOSIS — I480 Paroxysmal atrial fibrillation: Secondary | ICD-10-CM

## 2018-08-12 DIAGNOSIS — Z952 Presence of prosthetic heart valve: Secondary | ICD-10-CM | POA: Diagnosis not present

## 2018-08-12 DIAGNOSIS — Z23 Encounter for immunization: Secondary | ICD-10-CM | POA: Diagnosis not present

## 2018-08-12 LAB — COAGUCHEK XS/INR WAIVED
INR: 2.6 — ABNORMAL HIGH (ref 0.9–1.1)
Prothrombin Time: 31.7 s

## 2018-08-12 MED ORDER — WARFARIN SODIUM 3 MG PO TABS: ORAL_TABLET | ORAL | 2 refills | Status: DC

## 2018-08-12 MED ORDER — PANTOPRAZOLE SODIUM 40 MG PO TBEC: 40.0000 mg | DELAYED_RELEASE_TABLET | Freq: Every day | ORAL | 0 refills | Status: DC

## 2018-08-12 MED ORDER — LEVOTHYROXINE SODIUM 88 MCG PO TABS: 88.0000 ug | ORAL_TABLET | Freq: Every day | ORAL | 8 refills | Status: DC

## 2018-08-12 NOTE — Progress Notes (Signed)
Subjective:  Patient ID: [name], male    DOB: 1928-11-04  Age: 84 y.o. MRN: [id number]  CC: Coagulation Disorder   HPI SULTAN KETTNER presents for follow-up of atrial fibrillation.  He is also had aortic valve replacement.  He is on a lower level of anticoagulation due to a previous brain bleed.  Patient denies any recent bouts of chest pain or palpitations. Additionally, patient is taking anticoagulants. Patient denies any recent excessive bleeding episodes including epistaxis, bleeding from the gums, genitalia, rectal bleeding or hematuria. Additionally there has been no excessive bruising.  Patient's wife passed away on the Mar 18, 2023 of this month.  Just under 2 weeks ago.  He is mourning her death but is comforted by knowledge of her Christian salvation.  Depression screen [facility] 2/9 08/12/2018 07/29/2018 07/19/2018  Decreased Interest 0 0 0  Down, Depressed, Hopeless 0 0 0  PHQ - 2 Score 0 0 0  Altered sleeping - - -  Tired, decreased energy - - -  Change in appetite - - -  Feeling bad or failure about yourself  - - -  Trouble concentrating - - -  Moving slowly or fidgety/restless - - -  Suicidal thoughts - - -  PHQ-9 Score - - -  Some recent data might be hidden    History Josaiah has a past medical history of Aortic stenosis, CAD (coronary artery disease), Carotid stenosis, Cataract, CVA (cerebral infarction) (12/2010), DVT (deep venous thrombosis) (HCC), History of pulmonary embolism, History of rib fracture, History of shingles, Hyperlipidemia, Hypothyroidism, Pacemaker  Biotronik, Personal history of subdural hematoma, S/P cholecystectomy (2005), S/P shoulder surgery, Stroke (HCC) (2012), and Syncope.   He has a past surgical history that includes Aortic valve replacement (1985); Cardiac catheterization; Vascular surgery; Brain surgery; permanent pacemaker insertion (N/A, 08/01/2012); and Eye surgery.   His family history includes Arthritis in his sister; Cancer in his  father; Coronary artery disease in his other; Heart attack in his brother, brother, and father; Heart disease in his brother and father; Stroke in his mother.He reports that he has never smoked. He has never used smokeless tobacco. He reports that he does not drink alcohol or use drugs.    ROS Review of Systems  Constitutional: Negative.   HENT: Negative.   Eyes: Negative for visual disturbance.  Respiratory: Negative for cough and shortness of breath.   Cardiovascular: Negative for chest pain and leg swelling.  Gastrointestinal: Negative for abdominal pain, diarrhea, nausea and vomiting.  Genitourinary: Negative for difficulty urinating.  Musculoskeletal: Negative for arthralgias and myalgias.  Skin: Negative for rash.  Neurological: Negative for headaches.  Psychiatric/Behavioral: Negative for sleep disturbance.    Objective:  BP 131/64   Pulse 69   Temp (!) 97.1 F (36.2 C) (Oral)   Ht 5\' 8"  (1.727 m)   Wt 140 lb (63.5 kg)   BMI 21.29 kg/m   BP Readings from Last 3 Encounters:  08/12/18 131/64  07/29/18 (!) 98/57  07/22/18 117/63    Wt Readings from Last 3 Encounters:  08/12/18 140 lb (63.5 kg)  07/29/18 143 lb (64.9 kg)  07/22/18 143 lb 2 oz (64.9 kg)     Physical Exam  Constitutional: He is oriented to person, place, and time. He appears well-developed and well-nourished. No distress.  HENT:  Head: Normocephalic and atraumatic.  Right Ear: External ear normal.  Left Ear: External ear normal.  Nose: Nose normal.  Mouth/Throat: Oropharynx is clear and moist.  Eyes: Pupils are equal,  round, and reactive to light. Conjunctivae and EOM are normal.  Neck: Normal range of motion. Neck supple.  Cardiovascular: Normal rate, regular rhythm and normal heart sounds.  No murmur heard. Pulmonary/Chest: Effort normal and breath sounds normal. No respiratory distress. He has no wheezes. He has no rales.  Abdominal: Soft. There is no tenderness.  Musculoskeletal: Normal  range of motion.  Neurological: He is alert and oriented to person, place, and time. He has normal reflexes.  Skin: Skin is warm and dry.  Psychiatric: He has a normal mood and affect. His behavior is normal. Judgment and thought content normal.      Assessment & Plan:   Norvin was seen today for coagulation disorder.  Diagnoses and all orders for this visit:  AF (paroxysmal atrial fibrillation) (HCC) -     CoaguChek XS/INR Waived  Encounter for immunization -     Flu vaccine HIGH DOSE PF  Aortic valve replacement  Other orders -     pantoprazole (PROTONIX) 40 MG tablet; Take 1 tablet (40 mg total) by mouth daily. -     levothyroxine (SYNTHROID, LEVOTHROID) 88 MCG tablet; Take 1 tablet (88 mcg total) by mouth daily.       I have changed Mycah A. Speirs's pantoprazole and levothyroxine. I am also having him maintain his multivitamin, docusate sodium, tamsulosin, potassium chloride, furosemide, traMADol, latanoprost, dorzolamide, brimonidine-timolol, and methocarbamol.  Allergies as of 08/12/2018      Reactions   Ivp Dye [iodinated Diagnostic Agents] Swelling   Swelling   Nabumetone Hives   Codeine Nausea Only   Nausea   Sulfonamide Derivatives Hives, Itching   Hives itching      Medication List        Accurate as of 08/12/18 12:34 PM. Always use your most recent med list.          COMBIGAN 0.2-0.5 % ophthalmic solution Generic drug:  brimonidine-timolol Place 1 drop into the right eye 2 (two) times daily.   docusate sodium 100 MG capsule Commonly known as:  COLACE Take 100 mg by mouth daily.   dorzolamide 2 % ophthalmic solution Commonly known as:  TRUSOPT Place 1 drop into the right eye 2 (two) times daily.   furosemide 20 MG tablet Commonly known as:  LASIX TAKE 1 TABLET BY MOUTH EVERY DAY   latanoprost 0.005 % ophthalmic solution Commonly known as:  XALATAN Place 1 drop into both eyes at bedtime.   levothyroxine 88 MCG tablet Commonly known  as:  SYNTHROID, LEVOTHROID Take 1 tablet (88 mcg total) by mouth daily.   methocarbamol 500 MG tablet Commonly known as:  ROBAXIN Take 1 tablet (500 mg total) by mouth at bedtime.   multivitamin tablet Take 1 tablet by mouth daily.   pantoprazole 40 MG tablet Commonly known as:  PROTONIX Take 1 tablet (40 mg total) by mouth daily.   potassium chloride 10 MEQ tablet Commonly known as:  K-DUR,KLOR-CON TAKE 1 TABLET (10 MEQ TOTAL) BY MOUTH EVERY MORNING.   tamsulosin 0.4 MG Caps capsule Commonly known as:  FLOMAX TAKE 1 CAPSULE (0.4 MG TOTAL) BY MOUTH DAILY AFTER BREAKFAST.   traMADol 50 MG tablet Commonly known as:  ULTRAM Take 1 tablet (50 mg total) by mouth 2 (two) times daily.   warfarin 3 MG tablet Commonly known as:  COUMADIN Take as directed by the anticoagulation clinic. If you are unsure how to take this medication, talk to your nurse or doctor. Original instructions:  TAKE 1/2 TABLET EVERY OTHER  DAY, ALTERNATING WITH 1 TABLET EVERY OTHER DAY      Continue his Coumadin as is.  My condolences were expressed to him and his son who was also in the exam room.  Follow-up: Return in about 1 month (around 09/11/2018).  [name], M.D.

## 2018-08-12 NOTE — Telephone Encounter (Signed)
RF failed, resent 

## 2018-08-12 NOTE — Addendum Note (Signed)
Addended by: Julious Payer D on: 08/12/2018 11:26 AM   Modules accepted: Orders

## 2018-08-12 NOTE — Patient Instructions (Signed)

## 2018-08-19 ENCOUNTER — Telehealth: Payer: Self-pay | Admitting: Family Medicine

## 2018-08-19 NOTE — Telephone Encounter (Signed)
He needs to let the dentist kow he takes coumadin. IF there is any risk in the Dentist's mind that the pulling of the tooth could cause bleeding, then pt. Needs to see cardiology for possibly being pulled off of coumadin and temporarily placed on lovenox. WS

## 2018-08-19 NOTE — Telephone Encounter (Signed)
Patient aware.

## 2018-08-19 NOTE — Telephone Encounter (Signed)
Went to dentist, has cracked tooth and small infection, going to have tooth pulled Wednesday, they put him on clidamycine 150 mg antibiotic 3 times day Pt has been having issue with INR, they have finally got that under control according to [name] and he is just concerned how all of this may affect it.

## 2018-08-20 ENCOUNTER — Other Ambulatory Visit: Payer: Self-pay | Admitting: Family Medicine

## 2018-08-20 NOTE — Telephone Encounter (Signed)
Last lipid 11/28/16

## 2018-08-29 ENCOUNTER — Encounter: Payer: Self-pay | Admitting: Family Medicine

## 2018-08-29 ENCOUNTER — Ambulatory Visit (INDEPENDENT_AMBULATORY_CARE_PROVIDER_SITE_OTHER): Payer: Medicare HMO

## 2018-08-29 ENCOUNTER — Ambulatory Visit (HOSPITAL_COMMUNITY)
Admission: RE | Admit: 2018-08-29 | Discharge: 2018-08-29 | Disposition: A | Discharge status: Home / Self Care | Payer: Medicare HMO | Source: Ambulatory Visit | Attending: Family Medicine | Admitting: Family Medicine

## 2018-08-29 ENCOUNTER — Ambulatory Visit (INDEPENDENT_AMBULATORY_CARE_PROVIDER_SITE_OTHER): Payer: Medicare HMO | Admitting: Family Medicine

## 2018-08-29 VITALS — BP 130/70 | HR 72 | Temp 97.0°F | Ht 68.0 in | Wt 137.0 lb

## 2018-08-29 DIAGNOSIS — W19XXXA Unspecified fall, initial encounter: Secondary | ICD-10-CM

## 2018-08-29 DIAGNOSIS — Z043 Encounter for examination and observation following other accident: Secondary | ICD-10-CM | POA: Diagnosis not present

## 2018-08-29 DIAGNOSIS — Z7901 Long term (current) use of anticoagulants: Secondary | ICD-10-CM

## 2018-08-29 DIAGNOSIS — M542 Cervicalgia: Secondary | ICD-10-CM | POA: Diagnosis not present

## 2018-08-29 DIAGNOSIS — S0990XA Unspecified injury of head, initial encounter: Secondary | ICD-10-CM | POA: Diagnosis not present

## 2018-08-29 DIAGNOSIS — M25522 Pain in left elbow: Secondary | ICD-10-CM

## 2018-08-29 DIAGNOSIS — R51 Headache: Secondary | ICD-10-CM | POA: Diagnosis not present

## 2018-08-29 DIAGNOSIS — S199XXA Unspecified injury of neck, initial encounter: Secondary | ICD-10-CM | POA: Diagnosis not present

## 2018-08-29 DIAGNOSIS — S59902A Unspecified injury of left elbow, initial encounter: Secondary | ICD-10-CM | POA: Diagnosis not present

## 2018-08-29 NOTE — Patient Instructions (Addendum)
His physical exam is fairly unremarkable except for the mild abrasions on the face.  At this point, I do not suspect intracranial bleed but because he is on chronic anticoagulation in the fall was unwitnessed I do think he should have a CAT scan of his head and neck.  These have been ordered to be obtained at [facility].    Apply ice to the elbow and head to help with swelling and pain.  He may take Tylenol safely if needed for pain.  Avoid NSAIDs given chronic anticoagulation.  If he develops any worrisome symptoms or signs like altered mentation, altered consciousness, severe malaise or nausea and vomiting, please seek immediate medical attention the emergency department.  Fall Prevention in the Home Falls can cause injuries. They can happen to people of all ages. There are many things you can do to make your home safe and to help prevent falls. What can I do on the outside of my home?  Regularly fix the edges of walkways and driveways and fix any cracks.  Remove anything that might make you trip as you walk through a door, such as a raised step or threshold.  Trim any bushes or trees on the path to your home.  Use bright outdoor lighting.  Clear any walking paths of anything that might make someone trip, such as rocks or tools.  Regularly check to see if handrails are loose or broken. Make sure that both sides of any steps have handrails.  Any raised decks and porches should have guardrails on the edges.  Have any leaves, snow, or ice cleared regularly.  Use sand or salt on walking paths during winter.  Clean up any spills in your garage right away. This includes oil or grease spills. What can I do in the bathroom?  Use night lights.  Install grab bars by the toilet and in the tub and shower. Do not use towel bars as grab bars.  Use non-skid mats or decals in the tub or shower.  If you need to sit down in the shower, use a plastic, non-slip stool.  Keep the floor dry. Clean  up any water that spills on the floor as soon as it happens.  Remove soap buildup in the tub or shower regularly.  Attach bath mats securely with double-sided non-slip rug tape.  Do not have throw rugs and other things on the floor that can make you trip. What can I do in the bedroom?  Use night lights.  Make sure that you have a light by your bed that is easy to reach.  Do not use any sheets or blankets that are too big for your bed. They should not hang down onto the floor.  Have a firm chair that has side arms. You can use this for support while you get dressed.  Do not have throw rugs and other things on the floor that can make you trip. What can I do in the kitchen?  Clean up any spills right away.  Avoid walking on wet floors.  Keep items that you use a lot in easy-to-reach places.  If you need to reach something above you, use a strong step stool that has a grab bar.  Keep electrical cords out of the way.  Do not use floor polish or wax that makes floors slippery. If you must use wax, use non-skid floor wax.  Do not have throw rugs and other things on the floor that can make you trip. What  can I do with my stairs?  Do not leave any items on the stairs.  Make sure that there are handrails on both sides of the stairs and use them. Fix handrails that are broken or loose. Make sure that handrails are as long as the stairways.  Check any carpeting to make sure that it is firmly attached to the stairs. Fix any carpet that is loose or worn.  Avoid having throw rugs at the top or bottom of the stairs. If you do have throw rugs, attach them to the floor with carpet tape.  Make sure that you have a light switch at the top of the stairs and the bottom of the stairs. If you do not have them, ask someone to add them for you. What else can I do to help prevent falls?  Wear shoes that: ? Do not have high heels. ? Have rubber bottoms. ? Are comfortable and fit you well. ? Are  closed at the toe. Do not wear sandals.  If you use a stepladder: ? Make sure that it is fully opened. Do not climb a closed stepladder. ? Make sure that both sides of the stepladder are locked into place. ? Ask someone to hold it for you, if possible.  Clearly mark and make sure that you can see: ? Any grab bars or handrails. ? First and last steps. ? Where the edge of each step is.  Use tools that help you move around (mobility aids) if they are needed. These include: ? Canes. ? Walkers. ? Scooters. ? Crutches.  Turn on the lights when you go into a dark area. Replace any light bulbs as soon as they burn out.  Set up your furniture so you have a clear path. Avoid moving your furniture around.  If any of your floors are uneven, fix them.  If there are any pets around you, be aware of where they are.  Review your medicines with your doctor. Some medicines can make you feel dizzy. This can increase your chance of falling. Ask your doctor what other things that you can do to help prevent falls. This information is not intended to replace advice given to you by your health care provider. Make sure you discuss any questions you have with your health care provider. Document Released: 08/26/2009 Document Revised: 04/06/2016 Document Reviewed: 12/04/2014 Elsevier Interactive Patient Education  [employer].

## 2018-08-29 NOTE — Progress Notes (Signed)
Subjective: CC: fall PCP: [name], MD QMG:[id number] [name] is a 84 y.o. male presenting to clinic today for:  1. Fall Patient sustained a mechanical fall at home.  This was unwitnessed.  His nurses aide was attempting to get into the home but it was locked.  Apparently, when he went to get out of bed to answer the telephone he tripped and fell face forward.  He notes that he injured his left elbow and hit his face on the floor.  He does report it is carpeted.  He denies any sensation changes.  His family reports that he is mentally at baseline.  He is on chronic anticoagulation for aortic valve replacement with Coumadin.  He has had a history of subdural hematoma requiring surgical release in the past.  He points to the bridge of his nose and face as the source of pain.  His nurses aide notes that he has a lesion on the left anterior aspect of the scalp as well.  He points to the left tip of his elbow as the source of pain as well.  Event occurred this morning.   ROS: Per HPI  Allergies  Allergen Reactions  . Ivp Dye [Iodinated Diagnostic Agents] Swelling    Swelling   . Nabumetone Hives  . Codeine Nausea Only    Nausea   . Sulfonamide Derivatives Hives and Itching    Hives itching    Past Medical History:  Diagnosis Date  . Aortic stenosis    [facility] AVR in 1985; Echo 8/12: EF 50-55%, mild LVH, mech AV with mild AI, mean gradient 14 mmHg, mild to mod MR;  Echo 8/13: EF 60-65%, AVR ok with mean gradient 19 mmHg  . CAD (coronary artery disease)    Mild, nonobstructive at catheterization 5/01  . Carotid stenosis    Dopplers 8/13: RICA 40-50% and LICA 0-39% => followup 1 year  . Cataract   . CVA (cerebral infarction) 12/2010  . DVT (deep venous thrombosis) (HCC)   . History of pulmonary embolism    post op DVT/PE;  s/p IVC filter  . History of rib fracture   . History of shingles   . Hyperlipidemia   . Hypothyroidism   . Pacemaker  Biotronik   . Personal history of  subdural hematoma    Requiring craniotomy  . S/P cholecystectomy 2005  . S/P shoulder surgery    left  . Stroke (HCC) 2012  . Syncope    + carotid sinus massage    Current Outpatient Medications:  .  brimonidine-timolol (COMBIGAN) 0.2-0.5 % ophthalmic solution, Place 1 drop into the right eye 2 (two) times daily., Disp: , Rfl:  .  docusate sodium (COLACE) 100 MG capsule, Take 100 mg by mouth daily. , Disp: , Rfl:  .  dorzolamide (TRUSOPT) 2 % ophthalmic solution, Place 1 drop into the right eye 2 (two) times daily., Disp: , Rfl:  .  furosemide (LASIX) 20 MG tablet, TAKE 1 TABLET BY MOUTH EVERY DAY (Patient taking differently: Take 20 mg by mouth as needed. ), Disp: 90 tablet, Rfl: 1 .  latanoprost (XALATAN) 0.005 % ophthalmic solution, Place 1 drop into both eyes at bedtime., Disp: , Rfl:  .  levothyroxine (SYNTHROID, LEVOTHROID) 88 MCG tablet, Take 1 tablet (88 mcg total) by mouth daily., Disp: 90 tablet, Rfl: 8 .  Multiple Vitamin (MULTIVITAMIN) tablet, Take 1 tablet by mouth daily.  , Disp: , Rfl:  .  pantoprazole (PROTONIX) 40 MG tablet, Take 1  tablet (40 mg total) by mouth daily., Disp: 90 tablet, Rfl: 0 .  potassium chloride (K-DUR,KLOR-CON) 10 MEQ tablet, TAKE 1 TABLET (10 MEQ TOTAL) BY MOUTH EVERY MORNING. (Patient taking differently: Take 10 mEq by mouth as needed. ), Disp: 90 tablet, Rfl: 0 .  tamsulosin (FLOMAX) 0.4 MG CAPS capsule, TAKE 1 CAPSULE (0.4 MG TOTAL) BY MOUTH DAILY AFTER BREAKFAST., Disp: 90 capsule, Rfl: 0 .  warfarin (COUMADIN) 3 MG tablet, TAKE 1/2 TABLET EVERY OTHER DAY, ALTERNATING WITH 1 TABLET EVERY OTHER DAY, Disp: 69 tablet, Rfl: 2 Social History   Socioeconomic History  . Marital status: Married    Spouse name: Not on file  . Number of children: Not on file  . Years of education: Not on file  . Highest education level: Not on file  Occupational History  . Occupation: Retired    Associate Professor: RETIRED  Social Needs  . Financial resource strain: Not on file   . Food insecurity:    Worry: Not on file    Inability: Not on file  . Transportation needs:    Medical: Not on file    Non-medical: Not on file  Tobacco Use  . Smoking status: Never Smoker  . Smokeless tobacco: Never Used  Substance and Sexual Activity  . Alcohol use: No  . Drug use: No  . Sexual activity: Never  Lifestyle  . Physical activity:    Days per week: Not on file    Minutes per session: Not on file  . Stress: Not on file  Relationships  . Social connections:    Talks on phone: Not on file    Gets together: Not on file    Attends religious service: Not on file    Active member of club or organization: Not on file    Attends meetings of clubs or organizations: Not on file    Relationship status: Not on file  . Intimate partner violence:    Fear of current or ex partner: Not on file    Emotionally abused: Not on file    Physically abused: Not on file    Forced sexual activity: Not on file  Other Topics Concern  . Not on file  Social History Narrative  . Not on file   Family History  Problem Relation Age of Onset  . Stroke Mother   . Cancer Father        Lip  . Heart disease Father        Three MI  . Heart attack Father   . Heart attack Brother   . Heart disease Brother   . Heart attack Brother   . Arthritis Sister   . Coronary artery disease Other     Objective: Office vital signs reviewed. BP 130/70   Pulse 72   Temp (!) 97 F (36.1 C) (Oral)   Ht 5\' 8"  (1.727 m)   Wt 137 lb (62.1 kg)   BMI 20.83 kg/m   Physical Examination:  General: Awake, alert, well nourished, No acute distress HEENT: Small abrasion noted over the bridge of the nose and the base of the forehead.  He has a hemostatic bleed/abrasion on the left parietal aspect of the anterior scalp.  He has appreciable bony deformities/divots within the anterior cranium (this was from previous burr holes)    Neck: Patient has no midline tenderness to palpation.    Eyes: PRRL on Left;  patient is blind in right eye pupil is enlarged on that side, extraocular membranes intact,  sclera white    Throat: moist mucus membranes, symmetric rise of palate noted. Pulm: normal work of breathing on room air MSK: slow gait and station; requires assistance for ambulation  Left elbow: Patient has full active range of motion but does have pain with extension of the elbow.  There is no appreciable soft tissue swelling.  No palpable bony defects but he has tenderness to palpation to the exam over the olecranon process. Skin: dry; intact; no rashes or lesions Neuro: Cranial nerves III through XII grossly intact.  Alert and oriented x2 (not to time, person this is his baseline).  Dg Elbow 2 Views Left  Result Date: 08/29/2018 CLINICAL DATA:  Fall, left elbow pain EXAM: LEFT ELBOW - 2 VIEW COMPARISON:  None. FINDINGS: There is no evidence of fracture, dislocation, or joint effusion. There is no evidence of arthropathy or other focal bone abnormality. Soft tissues are unremarkable. IMPRESSION: Negative. Electronically Signed   By: Charlett Nose M.D.   On: 08/29/2018 10:04    Assessment/ Plan: 84 y.o. male   1. Head injuries, initial encounter No focal neurologic deficits.  Patient with history of subdural hematoma requiring surgical release in the past.  He is on chronic anticoagulation.  Will obtain CT head and C-spine to further evaluate.  Reasons for emergent evaluation emergency department discussed with the son.  He voiced good understanding will follow-up as needed. - CT Head Wo Contrast; Future - CT CERVICAL SPINE WO CONTRAST; Future  2. Fall, initial encounter - CT Head Wo Contrast; Future - CT CERVICAL SPINE WO CONTRAST; Future - DG Elbow 2 Views Left; Future  3. Chronic anticoagulation - CT Head Wo Contrast; Future - CT CERVICAL SPINE WO CONTRAST; Future  4. Left elbow pain There was a questionable nondisplaced fracture upon my personal review but radiologist determines this to  be shadowing and not a true fracture.  Likely contusion.  We discussed ice, Tylenol.  Avoidance of NSAIDs. - DG Elbow 2 Views Left; Future   No orders of the defined types were placed in this encounter.  No orders of the defined types were placed in this encounter.    Raliegh Ip, DO [facility] [phone]

## 2018-09-12 ENCOUNTER — Ambulatory Visit (INDEPENDENT_AMBULATORY_CARE_PROVIDER_SITE_OTHER): Payer: Medicare HMO | Admitting: Family Medicine

## 2018-09-12 ENCOUNTER — Encounter: Payer: Self-pay | Admitting: Family Medicine

## 2018-09-12 DIAGNOSIS — Z952 Presence of prosthetic heart valve: Secondary | ICD-10-CM | POA: Diagnosis not present

## 2018-09-12 DIAGNOSIS — I480 Paroxysmal atrial fibrillation: Secondary | ICD-10-CM

## 2018-09-12 LAB — PROTIME-INR: INR: 3 — AB (ref 0.9–1.1)

## 2018-09-12 LAB — COAGUCHEK XS/INR WAIVED
INR: 3 — AB (ref 0.9–1.1)
Prothrombin Time: 36.2 s

## 2018-09-12 NOTE — Progress Notes (Signed)
Subjective:  Patient ID: [name], male    DOB: 1928/02/02  Age: 84 y.o. MRN: [id number]  CC: No chief complaint on file.   HPI BRONNER MACGILL presents for Patient in for follow-up of atrial fibrillation and valve replacement. Patient denies any recent bouts of chest pain or palpitations. Additionally, patient is taking anticoagulants. Patient denies any recent excessive bleeding episodes including epistaxis, bleeding from the gums, genitalia, rectal bleeding or hematuria. Additionally there has been no excessive bruising.  INR today is 3.0.  A bit above the desired 2.5.  Even though he has a valve replacement they are trying to keep below 2.5 because of his history of  Patient continues to have pain but unfortunately could not take anti-inflammatories due to interaction with the Coumadin.  Pain is primarily in the lower back.  Depression screen [facility] 2/9 08/29/2018 08/12/2018 07/29/2018  Decreased Interest 0 0 0  Down, Depressed, Hopeless 0 0 0  PHQ - 2 Score 0 0 0  Altered sleeping - - -  Tired, decreased energy - - -  Change in appetite - - -  Feeling bad or failure about yourself  - - -  Trouble concentrating - - -  Moving slowly or fidgety/restless - - -  Suicidal thoughts - - -  PHQ-9 Score - - -  Some recent data might be hidden    History Roark has a past medical history of Aortic stenosis, CAD (coronary artery disease), Carotid stenosis, Cataract, CVA (cerebral infarction) (12/2010), DVT (deep venous thrombosis) (HCC), History of pulmonary embolism, History of rib fracture, History of shingles, Hyperlipidemia, Hypothyroidism, Pacemaker  Biotronik, Personal history of subdural hematoma, S/P cholecystectomy (2005), S/P shoulder surgery, Stroke (HCC) (2012), and Syncope.   He has a past surgical history that includes Aortic valve replacement (1985); Cardiac catheterization; Vascular surgery; Brain surgery; permanent pacemaker insertion (N/A, 08/01/2012); and Eye surgery.    His family history includes Arthritis in his sister; Cancer in his father; Coronary artery disease in his other; Heart attack in his brother, brother, and father; Heart disease in his brother and father; Stroke in his mother.He reports that he has never smoked. He has never used smokeless tobacco. He reports that he does not drink alcohol or use drugs.    ROS Review of Systems  Constitutional: Negative for fever.  Respiratory: Negative for shortness of breath.   Cardiovascular: Negative for chest pain.  Musculoskeletal: Negative for arthralgias.  Skin: Negative for rash.    Objective:  BP 134/66   Pulse 67   Temp (!) 97 F (36.1 C)   Ht 5\' 8"  (1.727 m)   Wt 134 lb (60.8 kg)   BMI 20.37 kg/m   BP Readings from Last 3 Encounters:  09/12/18 134/66  08/29/18 130/70  08/12/18 131/64    Wt Readings from Last 3 Encounters:  09/12/18 134 lb (60.8 kg)  08/29/18 137 lb (62.1 kg)  08/12/18 140 lb (63.5 kg)     Physical Exam  Constitutional: He appears well-developed and well-nourished.  HENT:  Head: Normocephalic and atraumatic.  Right Ear: Tympanic membrane and external ear normal. No decreased hearing is noted.  Left Ear: Tympanic membrane and external ear normal. No decreased hearing is noted.  Mouth/Throat: No oropharyngeal exudate or posterior oropharyngeal erythema.  Eyes: Pupils are equal, round, and reactive to light.  Neck: Normal range of motion. Neck supple.  Cardiovascular: Normal rate and regular rhythm.  No murmur heard. Pulmonary/Chest: Breath sounds normal. No respiratory distress.  Abdominal: Soft. Bowel sounds are normal. He exhibits no mass. There is no tenderness.  Vitals reviewed.     Assessment & Plan:   Diagnoses and all orders for this visit:  AF (paroxysmal atrial fibrillation) (HCC) -     CoaguChek XS/INR Waived  S/P AVR (aortic valve replacement)       I am having Shmuel A. Bosler maintain his multivitamin, docusate sodium,  tamsulosin, potassium chloride, furosemide, latanoprost, dorzolamide, brimonidine-timolol, pantoprazole, levothyroxine, and warfarin.  Allergies as of 09/12/2018      Reactions   Ivp Dye [iodinated Diagnostic Agents] Swelling   Swelling   Nabumetone Hives   Codeine Nausea Only   Nausea   Sulfonamide Derivatives Hives, Itching   Hives itching      Medication List        Accurate as of 09/12/18 11:59 PM. Always use your most recent med list.          COMBIGAN 0.2-0.5 % ophthalmic solution Generic drug:  brimonidine-timolol Place 1 drop into the right eye 2 (two) times daily.   docusate sodium 100 MG capsule Commonly known as:  COLACE Take 100 mg by mouth daily.   dorzolamide 2 % ophthalmic solution Commonly known as:  TRUSOPT Place 1 drop into the right eye 2 (two) times daily.   furosemide 20 MG tablet Commonly known as:  LASIX TAKE 1 TABLET BY MOUTH EVERY DAY   latanoprost 0.005 % ophthalmic solution Commonly known as:  XALATAN Place 1 drop into both eyes at bedtime.   levothyroxine 88 MCG tablet Commonly known as:  SYNTHROID, LEVOTHROID Take 1 tablet (88 mcg total) by mouth daily.   multivitamin tablet Take 1 tablet by mouth daily.   pantoprazole 40 MG tablet Commonly known as:  PROTONIX Take 1 tablet (40 mg total) by mouth daily.   potassium chloride 10 MEQ tablet Commonly known as:  K-DUR,KLOR-CON TAKE 1 TABLET (10 MEQ TOTAL) BY MOUTH EVERY MORNING.   tamsulosin 0.4 MG Caps capsule Commonly known as:  FLOMAX TAKE 1 CAPSULE (0.4 MG TOTAL) BY MOUTH DAILY AFTER BREAKFAST.   warfarin 3 MG tablet Commonly known as:  COUMADIN Take as directed by the anticoagulation clinic. If you are unsure how to take this medication, talk to your nurse or doctor. Original instructions:  TAKE 1/2 TABLET EVERY OTHER DAY, ALTERNATING WITH 1 TABLET EVERY OTHER DAY        Follow-up: Return in about 1 month (around 10/12/2018).  [name], M.D.

## 2018-09-15 NOTE — Patient Instructions (Signed)
Continue coumadin as is °

## 2018-10-15 ENCOUNTER — Ambulatory Visit (INDEPENDENT_AMBULATORY_CARE_PROVIDER_SITE_OTHER): Payer: Medicare HMO | Admitting: Family Medicine

## 2018-10-15 ENCOUNTER — Encounter: Payer: Self-pay | Admitting: Family Medicine

## 2018-10-15 VITALS — BP 104/64 | HR 81 | Temp 97.0°F | Ht 68.0 in | Wt 132.6 lb

## 2018-10-15 DIAGNOSIS — Z952 Presence of prosthetic heart valve: Secondary | ICD-10-CM | POA: Diagnosis not present

## 2018-10-15 DIAGNOSIS — I480 Paroxysmal atrial fibrillation: Secondary | ICD-10-CM | POA: Diagnosis not present

## 2018-10-15 LAB — COAGUCHEK XS/INR WAIVED
INR: 3.2 — ABNORMAL HIGH (ref 0.9–1.1)
Prothrombin Time: 38.1 s

## 2018-10-15 NOTE — Progress Notes (Signed)
Chief Complaint  Patient presents with  . Coagulation Disorder    1 month follow up    HPI  Patient presents today for Patient in for follow-up of valve replacement and a. fib. Patient denies any recent bouts of chest pain or palpitations. Additionally, patient is taking anticoagulants. Patient denies any recent excessive bleeding episodes including epistaxis, bleeding from the gums, genitalia, rectal bleeding or hematuria. Additionally there has been no excessive bruising.  PMH: Smoking status noted ROS: Per HPI  Objective: BP 104/64   Pulse 81   Temp (!) 97 F (36.1 C) (Oral)   Ht 5\' 8"  (1.727 m)   Wt 132 lb 9.6 oz (60.1 kg)   BMI 20.16 kg/m  Gen: NAD, alert, cooperative with exam HEENT: NCAT, EOMI, PERRL CV: RRR, good S1/S2, no murmur Resp: CTABL, no wheezes, non-labored Ext: No edema, warm Neuro: Alert and oriented, No gross deficits  Assessment and plan:  1. AF (paroxysmal atrial fibrillation) (HCC)   2. Aortic valve replacement     No orders of the defined types were placed in this encounter.   Orders Placed This Encounter  Procedures  . CoaguChek XS/INR Waived    Follow up one month [name], MD

## 2018-10-27 ENCOUNTER — Other Ambulatory Visit: Payer: Self-pay | Admitting: Family Medicine

## 2018-10-29 ENCOUNTER — Encounter: Payer: Self-pay | Admitting: *Deleted

## 2018-10-29 ENCOUNTER — Ambulatory Visit (INDEPENDENT_AMBULATORY_CARE_PROVIDER_SITE_OTHER): Payer: Medicare HMO | Admitting: *Deleted

## 2018-10-29 VITALS — BP 117/65 | HR 81 | Ht 68.0 in | Wt 134.0 lb

## 2018-10-29 DIAGNOSIS — Z0000 Encounter for general adult medical examination without abnormal findings: Secondary | ICD-10-CM | POA: Diagnosis not present

## 2018-10-29 NOTE — Progress Notes (Addendum)
Subjective:   [name] is a 84 y.o. male who presents for Medicare Annual/Subsequent preventive examination.  [name] is accompanied today by his caregiver and one of his sons.  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  He enjoys working in the yard and watching television.  [name] wife passed away in Aug 03, 2018.  He lives alone, but has a caregiver that stays with him 12 hours per day.  He also lives next door to one of his sons, and both of his sons check on him daily.  He has 2 sons, 4 grandchildren, and 5 great grandchildren.  He feels his health has declined in the past year because his balance and mobility have worsened.  He reports no ER visits, hospitalizations, or surgeries in the past year.   Review of Systems:   Musculoskeletal - positive for bilateral shoulder pain  Cardiac Risk Factors include: advanced age (>31men, >92 women);dyslipidemia;male gender     Objective:    Vitals: BP 117/65   Pulse 81   Ht 5\' 8"  (1.727 m)   Wt 134 lb (60.8 kg)   BMI 20.37 kg/m   Body mass index is 20.37 kg/m.  Advanced Directives 10/29/2018 03/10/2016 02/17/2015 08/01/2012 08/01/2012  Does Patient Have a Medical Advance Directive? Yes Yes Yes Patient does not have advance directive -  Type of Public librarian Power of [place];Living will Healthcare Power of [place];Living will Healthcare Power of [place];Living will - -  Does patient want to make changes to medical advance directive? No - Patient declined No - Patient declined - - -  Copy of Healthcare Power of Attorney in Chart? No - copy requested No - copy requested No - copy requested - -  Pre-existing out of facility DNR order (yellow form or pink MOST form) - - - No No    Tobacco Social History   Tobacco Use  Smoking Status Never Smoker  Smokeless Tobacco Never Used     Counseling given: No   Clinical Intake:     Pain Score: 5                  Past Medical  History:  Diagnosis Date  . Aortic stenosis    St.Jude AVR in 1985; Echo 8/12: EF 50-55%, mild LVH, mech AV with mild AI, mean gradient 14 mmHg, mild to mod MR;  Echo 8/13: EF 60-65%, AVR ok with mean gradient 19 mmHg  . CAD (coronary artery disease)    Mild, nonobstructive at catheterization 5/01  . Carotid stenosis    Dopplers 8/13: RICA 40-50% and LICA 0-39% => followup 1 year  . Cataract   . CVA (cerebral infarction) 12/2010  . DVT (deep venous thrombosis) (HCC)   . History of pulmonary embolism    post op DVT/PE;  s/p IVC filter  . History of rib fracture   . History of shingles   . Hyperlipidemia   . Hypothyroidism   . Pacemaker  Biotronik   . Personal history of subdural hematoma    Requiring craniotomy  . S/P cholecystectomy 2005  . S/P shoulder surgery    left  . Stroke (HCC) 2012  . Syncope    + carotid sinus massage   Past Surgical History:  Procedure Laterality Date  . AORTIC VALVE REPLACEMENT  1985   St. Jude / mechanical  . BRAIN SURGERY    . CARDIAC CATHETERIZATION    . EYE SURGERY  bilateral cataract  . PERMANENT PACEMAKER INSERTION N/A 08/01/2012   Procedure: PERMANENT PACEMAKER INSERTION;  Surgeon: Duke Salvia, MD;  Location: [facility] CATH LAB;  Service: Cardiovascular;  Laterality: N/A;  . VASCULAR SURGERY     Family History  Problem Relation Age of Onset  . Stroke Mother   . Cancer Father        Lip  . Heart disease Father        Three MI  . Heart attack Father   . Heart attack Brother   . Heart disease Brother   . Heart attack Brother   . Arthritis Sister   . Coronary artery disease Other    Social History   Socioeconomic History  . Marital status: Widowed    Spouse name: Not on file  . Number of children: 2  . Years of education: Not on file  . Highest education level: 11th grade  Occupational History  . Occupation: Retired    Associate Professor: RETIRED    CommentTheatre stage manager at [employer]  Social Needs  . Financial resource strain: Not  hard at all  . Food insecurity:    Worry: Never true    Inability: Never true  . Transportation needs:    Medical: No    Non-medical: No  Tobacco Use  . Smoking status: Never Smoker  . Smokeless tobacco: Never Used  Substance and Sexual Activity  . Alcohol use: No  . Drug use: No  . Sexual activity: Never  Lifestyle  . Physical activity:    Days per week: 0 days    Minutes per session: 0 min  . Stress: Only a little  Relationships  . Social connections:    Talks on phone: More than three times a week    Gets together: More than three times a week    Attends religious service: Never    Active member of club or organization: No    Attends meetings of clubs or organizations: Never    Relationship status: Widowed  Other Topics Concern  . Not on file  Social History Narrative  . Not on file    Outpatient Encounter Medications as of 10/29/2018  Medication Sig  . brimonidine-timolol (COMBIGAN) 0.2-0.5 % ophthalmic solution Place 1 drop into the right eye 2 (two) times daily.  Marland Kitchen COUMADIN 3 MG tablet TAKE 1 DAILY EXCEPT 1/2 TABLET ON MONDAY AND FRIDAY (Patient taking differently: Take 1/2 tablet every other day, and 1 tablet every other day.)  . docusate sodium (COLACE) 100 MG capsule Take 100 mg by mouth daily.   . dorzolamide (TRUSOPT) 2 % ophthalmic solution Place 1 drop into the right eye 2 (two) times daily.  . furosemide (LASIX) 20 MG tablet TAKE 1 TABLET BY MOUTH EVERY DAY (Patient taking differently: Take 20 mg by mouth as needed. )  . latanoprost (XALATAN) 0.005 % ophthalmic solution Place 1 drop into both eyes at bedtime.  Marland Kitchen levothyroxine (SYNTHROID, LEVOTHROID) 88 MCG tablet Take 1 tablet (88 mcg total) by mouth daily.  . Multiple Vitamin (MULTIVITAMIN) tablet Take 1 tablet by mouth daily.    . pantoprazole (PROTONIX) 40 MG tablet TAKE 1 TABLET EVERY DAY  . potassium chloride (K-DUR,KLOR-CON) 10 MEQ tablet TAKE 1 TABLET (10 MEQ TOTAL) BY MOUTH EVERY MORNING. (Patient  taking differently: Take 10 mEq by mouth as needed. )  . tamsulosin (FLOMAX) 0.4 MG CAPS capsule TAKE 1 CAPSULE (0.4 MG TOTAL) BY MOUTH DAILY AFTER BREAKFAST.   No facility-administered encounter medications on file  as of 10/29/2018.     Activities of Daily Living In your present state of health, do you have any difficulty performing the following activities: 10/29/2018  Hearing? Y  Comment Bilateral hearing deficit, has been evaluated by audiology does not want hearing aids  Vision? Y  Comment significant vision loss in right eye, uses drops for glaucoma.  Wears glasses and sees Dr. Dione Booze every few months  Difficulty concentrating or making decisions? Y  Comment Trouble concentrating, remembering and making decisions  Walking or climbing stairs? Y  Comment Decreased balance   Dressing or bathing? N  Doing errands, shopping? Y  Comment Caregivers or family take to doctor's appointments and do grocery shopping  Preparing Food and eating ? N  Using the Toilet? N  In the past six months, have you accidently leaked urine? N  Do you have problems with loss of bowel control? N  Managing your Medications? Y  Comment Son manages  Managing your Finances? Y  Comment Son Chief Executive Officer or managing your Housekeeping? Y  Comment Sons help with house maintenance   Some recent data might be hidden    Patient Care Team: [name], MD as PCP - General (Family Medicine) Ernesto Rutherford, MD as Consulting Physician (Ophthalmology) Duke Salvia, MD as Consulting Physician (Cardiology)   Assessment:   This is a routine wellness examination for [name].  Exercise Activities and Dietary recommendations  [name] states he eats 3 meals per day and snacks as needed.  Usually has eggs, bacon, cantaloupe, and grits for breakfast, and meat and various side dishes such as mashed potatoes and green beans for lunch and supper.  He also enjoys having dessert when available.  Advised him to  make healthy choices as often as possible- including lean proteins, non-starchy vegetables, fruits and whole grains.  His caregiver or sons prepare or get takeout for all of his meals.  He has access to all the food he needs.    Current Exercise Habits: Home exercise routine, Type of exercise: stretching;walking, Time (Minutes): 10, Frequency (Times/Week): 2, Weekly Exercise (Minutes/Week): 20, Intensity: Mild, Exercise limited by: neurologic condition(s);orthopedic condition(s);cardiac condition(s)  Goals    . Increase physical activity     Try walking more for exercise    . Increase physical activity     Walk around house daily with caregiver, and do chair exercises daily.     . Reduce sodium intake       Fall Risk Fall Risk  10/29/2018 10/15/2018 08/29/2018 08/12/2018 07/29/2018  Falls in the past year? 1 0 Yes Yes Yes  Number falls in past yr: 0 - 2 or more 1 1  Injury with Fall? 0 - Yes No No  Risk for fall due to : Impaired balance/gait;Impaired mobility;Impaired vision - - - -  Follow up Education provided;Falls prevention discussed - - - -   He had one fall in the past year - he fell out of the bed.    Is the patient's home free of loose throw rugs in walkways, pet beds, electrical cords, etc?   yes      Grab bars in the bathroom? no, patient does not get into tub because it is hard for him to step into.  He takes sink baths most days.      Handrails on the stairs?   yes      Adequate lighting?   yes    Depression Screen PHQ 2/9 Scores 10/29/2018 10/15/2018 08/29/2018 08/12/2018  PHQ -  2 Score 1 0 0 0  PHQ- 9 Score - - - -    Cognitive Function MMSE - Mini Mental State Exam 03/10/2016 03/10/2016 02/17/2015  Orientation to time 5 5 5   Orientation to Place 5 5 5   Registration 3 3 3   Attention/ Calculation 3 3 4   Recall 1 1 0  Language- name 2 objects 2 2 2   Language- repeat 1 1 1   Language- follow 3 step command 3 3 3   Language- read & follow direction 1 - 1  Write a  sentence 1 - 1  Copy design 0 - 0  Total score 25 - 25     6CIT Screen 10/29/2018  What Year? 0 points  What month? 0 points  What time? 0 points  Count back from 20 0 points  Months in reverse 0 points  Repeat phrase 4 points  Total Score 4    Immunization History  Administered Date(s) Administered  . Influenza, High Dose Seasonal PF 08/01/2016, 08/23/2017, 08/23/2017, 08/12/2018  . Influenza,inj,Quad PF,6+ Mos 08/25/2013, 08/10/2014, 08/27/2015  . Pneumococcal Conjugate-13 08/10/2014  . Pneumococcal Polysaccharide-23 08/23/2017    Qualifies for Shingles Vaccine? Yes, declined today  Screening Tests Health Maintenance  Topic Date Due  . TETANUS/TDAP  07/14/1947  . DEXA SCAN  03/14/2018  . INFLUENZA VACCINE  Completed  . PNA vac Low Risk Adult  Completed   Tdap declined Dexa scan not indicated at age 88.     Cancer Screenings: Lung: Low Dose CT Chest recommended if Age 48-80 years, 30 pack-year currently smoking OR have quit w/in 15years. Patient does not qualify. Colorectal: not indicated  Additional Screenings:  Hepatitis C Screening:  Not indicated      Plan:     Work on your goal of increasing your physical activity.  Walking around your house and doing chair exercises daily are great options.  Continue to move carefully to avoid falls, and use cane as needed. Bring a copy of your Advance Directives to our office to be filed in your medical record. Follow up with Dr. Darlyn Read as scheduled.    I have personally reviewed and noted the following in the patient's chart:   . Medical and social history . Use of alcohol, tobacco or illicit drugs  . Current medications and supplements . Functional ability and status . Nutritional status . Physical activity . Advanced directives . List of other physicians . Hospitalizations, surgeries, and ER visits in previous 12 months . Vitals . Screenings to include cognitive, depression, and falls . Referrals and  appointments  In addition, I have reviewed and discussed with patient certain preventive protocols, quality metrics, and best practice recommendations. A written personalized care plan for preventive services as well as general preventive health recommendations were provided to patient.     Muhammad Vacca M, RN  10/29/2018  I have reviewed and agree with the above AWV documentation.  [name], M.D.

## 2018-10-29 NOTE — Patient Instructions (Signed)
Please work on your goal of increasing your physical activity.  Walking around your house and doing chair exercises daily are great options.   Continue to move carefully to avoid falls, and use cane as needed.  At you convenience, please bring a copy of your Advance Directives to our office to be filed in your medical record.  Please follow up with Dr. Livia Snellen as scheduled.   Thank you for coming in for your Annual Wellness Visit today, and hope you have a Merry Christmas!!  Preventive Care 33 Years and Older, Male Preventive care refers to lifestyle choices and visits with your health care provider that can promote health and wellness. What does preventive care include?  A yearly physical exam. This is also called an annual well check.  Dental exams once or twice a year.  Routine eye exams. Ask your health care provider how often you should have your eyes checked.  Personal lifestyle choices, including: ? Daily care of your teeth and gums. ? Regular physical activity. ? Eating a healthy diet. ? Avoiding tobacco and drug use. ? Limiting alcohol use. ? Practicing safe sex. ? Taking low doses of aspirin every day. ? Taking vitamin and mineral supplements as recommended by your health care provider. What happens during an annual well check? The services and screenings done by your health care provider during your annual well check will depend on your age, overall health, lifestyle risk factors, and family history of disease. Counseling Your health care provider may ask you questions about your:  Alcohol use.  Tobacco use.  Drug use.  Emotional well-being.  Home and relationship well-being.  Sexual activity.  Eating habits.  History of falls.  Memory and ability to understand (cognition).  Work and work Statistician.  Screening You may have the following tests or measurements:  Height, weight, and BMI.  Blood pressure.  Lipid and cholesterol levels. These may be  checked every 5 years, or more frequently if you are over 23 years old.  Skin check.  Lung cancer screening. You may have this screening every year starting at age 35 if you have a 30-pack-year history of smoking and currently smoke or have quit within the past 15 years.  Fecal occult blood test (FOBT) of the stool. You may have this test every year starting at age 67.  Flexible sigmoidoscopy or colonoscopy. You may have a sigmoidoscopy every 5 years or a colonoscopy every 10 years starting at age 24.  Prostate cancer screening. Recommendations will vary depending on your family history and other risks.  Hepatitis C blood test.  Hepatitis B blood test.  Sexually transmitted disease (STD) testing.  Diabetes screening. This is done by checking your blood sugar (glucose) after you have not eaten for a while (fasting). You may have this done every 1-3 years.  Abdominal aortic aneurysm (AAA) screening. You may need this if you are a current or former smoker.  Osteoporosis. You may be screened starting at age 34 if you are at high risk.  Talk with your health care provider about your test results, treatment options, and if necessary, the need for more tests. Vaccines Your health care provider may recommend certain vaccines, such as:  Influenza vaccine. This is recommended every year.  Tetanus, diphtheria, and acellular pertussis (Tdap, Td) vaccine. You may need a Td booster every 10 years.  Varicella vaccine. You may need this if you have not been vaccinated.  Zoster vaccine. You may need this after age 26.  Measles,  mumps, and rubella (MMR) vaccine. You may need at least one dose of MMR if you were born in 1957 or later. You may also need a second dose.  Pneumococcal 13-valent conjugate (PCV13) vaccine. One dose is recommended after age 28.  Pneumococcal polysaccharide (PPSV23) vaccine. One dose is recommended after age 79.  Meningococcal vaccine. You may need this if you have  certain conditions.  Hepatitis A vaccine. You may need this if you have certain conditions or if you travel or work in places where you may be exposed to hepatitis A.  Hepatitis B vaccine. You may need this if you have certain conditions or if you travel or work in places where you may be exposed to hepatitis B.  Haemophilus influenzae type b (Hib) vaccine. You may need this if you have certain risk factors.  Talk to your health care provider about which screenings and vaccines you need and how often you need them. This information is not intended to replace advice given to you by your health care provider. Make sure you discuss any questions you have with your health care provider. Document Released: 11/26/2015 Document Revised: 07/19/2016 Document Reviewed: 08/31/2015 Elsevier Interactive Patient Education  2018 [place] in the [place] Falls can cause injuries. They can happen to people of all ages. There are many things you can do to make your home safe and to help prevent falls. What can I do on the outside of my home?  Regularly fix the edges of walkways and driveways and fix any cracks.  Remove anything that might make you trip as you walk through a door, such as a raised step or threshold.  Trim any bushes or trees on the path to your home.  Use bright outdoor lighting.  Clear any walking paths of anything that might make someone trip, such as rocks or tools.  Regularly check to see if handrails are loose or broken. Make sure that both sides of any steps have handrails.  Any raised decks and porches should have guardrails on the edges.  Have any leaves, snow, or ice cleared regularly.  Use sand or salt on walking paths during winter.  Clean up any spills in your garage right away. This includes oil or grease spills. What can I do in the bathroom?  Use night lights.  Install grab bars by the toilet and in the tub and shower. Do not use towel bars as  grab bars.  Use non-skid mats or decals in the tub or shower.  If you need to sit down in the shower, use a plastic, non-slip stool.  Keep the floor dry. Clean up any water that spills on the floor as soon as it happens.  Remove soap buildup in the tub or shower regularly.  Attach bath mats securely with double-sided non-slip rug tape.  Do not have throw rugs and other things on the floor that can make you trip. What can I do in the bedroom?  Use night lights.  Make sure that you have a light by your bed that is easy to reach.  Do not use any sheets or blankets that are too big for your bed. They should not hang down onto the floor.  Have a firm chair that has side arms. You can use this for support while you get dressed.  Do not have throw rugs and other things on the floor that can make you trip. What can I do in the kitchen?  Clean  up any spills right away.  Avoid walking on wet floors.  Keep items that you use a lot in easy-to-reach places.  If you need to reach something above you, use a strong step stool that has a grab bar.  Keep electrical cords out of the way.  Do not use floor polish or wax that makes floors slippery. If you must use wax, use non-skid floor wax.  Do not have throw rugs and other things on the floor that can make you trip. What can I do with my stairs?  Do not leave any items on the stairs.  Make sure that there are handrails on both sides of the stairs and use them. Fix handrails that are broken or loose. Make sure that handrails are as long as the stairways.  Check any carpeting to make sure that it is firmly attached to the stairs. Fix any carpet that is loose or worn.  Avoid having throw rugs at the top or bottom of the stairs. If you do have throw rugs, attach them to the floor with carpet tape.  Make sure that you have a light switch at the top of the stairs and the bottom of the stairs. If you do not have them, ask someone to add them  for you. What else can I do to help prevent falls?  Wear shoes that: ? Do not have high heels. ? Have rubber bottoms. ? Are comfortable and fit you well. ? Are closed at the toe. Do not wear sandals.  If you use a stepladder: ? Make sure that it is fully opened. Do not climb a closed stepladder. ? Make sure that both sides of the stepladder are locked into place. ? Ask someone to hold it for you, if possible.  Clearly mark and make sure that you can see: ? Any grab bars or handrails. ? First and last steps. ? Where the edge of each step is.  Use tools that help you move around (mobility aids) if they are needed. These include: ? Canes. ? Walkers. ? Scooters. ? Crutches.  Turn on the lights when you go into a dark area. Replace any light bulbs as soon as they burn out.  Set up your furniture so you have a clear path. Avoid moving your furniture around.  If any of your floors are uneven, fix them.  If there are any pets around you, be aware of where they are.  Review your medicines with your doctor. Some medicines can make you feel dizzy. This can increase your chance of falling. Ask your doctor what other things that you can do to help prevent falls. This information is not intended to replace advice given to you by your health care provider. Make sure you discuss any questions you have with your health care provider. Document Released: 08/26/2009 Document Revised: 04/06/2016 Document Reviewed: 12/04/2014 Elsevier Interactive Patient Education  2018 [employer].      8 Easy Exercises You Can Do Sitting Down  Got a chair? Then you're ready for this sit-down, total-body workout!.  Safety precaution: Pay attention to your body during the movements - if anything hurts or causes pain, stop immediately. And check with your doctor first before beginning this, or any, exercise program.   Sunshine Arm Circles Seated in a chair with good posture, hold a ball in both hands  with arms extended above your head and/or in front of you, keeping elbows slightly bent. Visualizing the face of a clock out in  front of you, begin by holding arms up overhead at 12 o'clock. Circle the ball around to go all the way around the clock in a controlled, fluid motion. When you've reached 12 o'clock again, reverse directions and circle the opposite way. Keep alternating circle directions for 8 repetitions. Rest. Do another set of 8 repetitions.  Modification: A ball is not required for this exercise. Imagine that you are holding a ball while performing the motion. If it is difficult to bring your arms overhead, extend them out in front of you and move arms as if drawing a circle on the wall with or without the ball.   Tummy Twists Seated in a chair with good posture, hold a ball with both hands close to the body, with elbows bent and pulled in close to the ribcage. Slowly rotate your torso to the right as far as you comfortably can, being sure to keep the rest of your body still and stable. Rotate back to the center and repeat in the opposite direction. Do this 8 times, with two twists counting as a full set. Rest. Do another 8 sets (two twists each). Modification: A ball is not required for this exercise. Imagine you are holding a ball while performing the motion, or hold a small object such as a can of soup or water bottle to add resistance   Ball Chest Press Seated in a chair with good posture, hold a ball with both hands at chest level, palms facing toward each other and elbows bent. Avoid bending forward by keeping your shoulders back at all times. Squeeze the ball slightly as you push the ball away from you in a fluid motion, taking about 2 seconds to extend the arms. Squeeze your shoulder blades together as you pull the ball back toward your chest. Repeat the push and pull motion 10 to 15 times. Rest. Do another set of 10 to 15 repetitions.  Modification: For a  greater challenge, add a Tai Chi feel by standing with one leg slightly in front of the other (with a chair nearby if needed for extra balance) and slowly rocking the entire body forward and back as you push the ball away and pull back in.     Front Arm Raises In a seated position with good posture, hold a ball in both hands with palms facing each other. Extend the arms out in front of your body, keeping your elbows slightly bent. Starting with the ball lowered toward the knees, slowly raise your arms to lift the ball up to shoulder level (no higher), then lower the ball back to the starting position, taking about 2 to 3 seconds to lift and lower. Repeat 10 to 15 times. Rest. Do another set of 10 to 15 repetitions. Modification: A ball is not required for this exercise. Imagine you are holding a ball as you perform the motion, or hold a small object, such as a can of soup or water bottle for added resistance.        Inner Thigh Squeezes Sitting toward the edge of a chair with good posture and knees bent, place a ball in between your knees; press the knees together to squeeze the ball, taking about 1 to 2 seconds to squeeze. You should feel the resistance in your inner thighs. Slowly release, keeping slight tension on the ball so that it does not fall. Repeat 8 to 10 times. Rest. Do another set of 8 to 10 repetitions. Modification: For a greater challenge, change  the count of the squeezes by squeezing the ball and holding for 5 seconds, then releasing again. Or, do short, quick pulsing squeezes.     Knee Extensions Sitting toward the edge of a chair with good posture and bent knees, hold on to the sides of the chair with your hands. Extend the right knee out so that the toes come up toward the ceiling, being sure to keep the knee slightly bent without locking it through the entire movement. Lower the leg back to a bent position and repeat this movement 8 to 10 times, using  about 2 seconds each to lift and lower the leg. Switch to the opposite leg and perform 8 to 10 repetitions. Rest briefly. Do another set of 8 to 10 repetitions for each leg. Modification: If you are more advanced, sitting in the same position as above, extend one leg out in front of you with toes pointed to the ceiling. Lift and lower the entire leg only as high as you comfortably can, keeping the knee slightly bent. The longer lever adds difficulty to the exercise.   Elbow to Knee Seated toward the edge of a chair with good posture and knees bent, start with your right arm extended up overhead. Slowly lift the left knee up as you lower your right elbow down toward your left knee, taking about 2 seconds to lower down. Try not to bend over at the waist. Release and go back to the starting position. Repeat 8 to 10 times. Switch sides and do 8 to 10 repetitions, pulling one elbow to the opposite knee. Rest. Do another set of 8 to 10 repetitions on each side. Modification: Try this (with a chair nearby for balance) exercise in a standing position for an increased range of motion.     Overhead Arm Extensions Seated in a chair with good posture, hold a ball with both hands and raise it up over your head, with arms extended without locking the elbows. Keeping the elbows pulled in toward the head, slowly bend the elbows to lower the ball down along the back of the neck, using about 2 seconds to go down, then 2 seconds to push the ball back up over your head. Repeat 8 to 10 times. Rest. Do another set of 8 to 10 repetitions. Modification: Try seated tricep extensions (ball not required for this modification). Bending slightly forward with elbows tucked into your sides, slowly extend the elbows so that your forearms go back behind you, keeping the elbows pulled up and in for the entire movement. Return to the starting position and repeat. Hold soup cans or small weights for added  resistance.

## 2018-11-11 ENCOUNTER — Other Ambulatory Visit: Payer: Self-pay | Admitting: Family Medicine

## 2018-11-11 DIAGNOSIS — N400 Enlarged prostate without lower urinary tract symptoms: Secondary | ICD-10-CM

## 2018-11-18 ENCOUNTER — Encounter: Payer: Self-pay | Admitting: Family Medicine

## 2018-11-18 ENCOUNTER — Ambulatory Visit (INDEPENDENT_AMBULATORY_CARE_PROVIDER_SITE_OTHER): Payer: Medicare HMO | Admitting: Family Medicine

## 2018-11-18 VITALS — BP 133/68 | HR 71 | Temp 97.1°F | Ht 68.0 in | Wt 132.0 lb

## 2018-11-18 DIAGNOSIS — I480 Paroxysmal atrial fibrillation: Secondary | ICD-10-CM

## 2018-11-18 DIAGNOSIS — Z952 Presence of prosthetic heart valve: Secondary | ICD-10-CM | POA: Diagnosis not present

## 2018-11-18 LAB — COAGUCHEK XS/INR WAIVED
INR: 2.2 — AB (ref 0.9–1.1)
PROTHROMBIN TIME: 26.2 s

## 2018-11-18 NOTE — Progress Notes (Signed)
Subjective:  Patient ID: [name], male    DOB: Feb 28, 1928  Age: 85 y.o. MRN: [id number]  CC: Atrial Fibrillation   HPI [name] presents for Patient in for follow-up of atrial fibrillation. Patient denies any recent bouts of chest pain or palpitations. Additionally, patient is taking anticoagulants. Patient denies any recent excessive bleeding episodes including epistaxis, bleeding from the gums, genitalia, rectal bleeding or hematuria. Additionally there has been no excessive bruising..  Patient also has a mechanical aortic valve.  For this reason cannot use the newer novel agents for anticoagulation and continues to use Coumadin.  Depression screen [facility] 2/9 11/18/2018 10/29/2018 10/15/2018  Decreased Interest 0 0 0  Down, Depressed, Hopeless 0 1 0  PHQ - 2 Score 0 1 0  Altered sleeping 0 - -  Tired, decreased energy 0 - -  Change in appetite 0 - -  Feeling bad or failure about yourself  0 - -  Trouble concentrating 0 - -  Moving slowly or fidgety/restless 0 - -  Suicidal thoughts 0 - -  PHQ-9 Score 0 - -  Difficult doing work/chores Not difficult at all - -  Some recent data might be hidden    History [name] has a past medical history of Aortic stenosis, CAD (coronary artery disease), Carotid stenosis, Cataract, CVA (cerebral infarction) (12/2010), DVT (deep venous thrombosis) (HCC), History of pulmonary embolism, History of rib fracture, History of shingles, Hyperlipidemia, Hypothyroidism, Pacemaker  Biotronik, Personal history of subdural hematoma, S/P cholecystectomy (2005), S/P shoulder surgery, Stroke (HCC) (2012), and Syncope.   [name] has a past surgical history that includes Aortic valve replacement (1985); Cardiac catheterization; Vascular surgery; Brain surgery; permanent pacemaker insertion (N/A, 08/01/2012); and Eye surgery.   His family history includes Arthritis in his sister; Cancer in his father; Coronary artery disease in an other family member; Heart  attack in his brother, brother, and father; Heart disease in his brother and father; Stroke in his mother.[name] reports that [name] has never smoked. [name] has never used smokeless tobacco. [name] reports that [name] does not drink alcohol or use drugs.    ROS Review of Systems  Constitutional: Negative for fever.  Respiratory: Negative for shortness of breath.   Cardiovascular: Negative for chest pain.  Musculoskeletal: Negative for arthralgias.  Skin: Negative for rash.  Psychiatric/Behavioral: Positive for confusion (mild short term memory loss (as noted at [facility]    Objective:  BP 133/68   Pulse 71   Temp (!) 97.1 F (36.2 C) (Oral)   Ht 5\' 8"  (1.727 m)   Wt 132 lb (59.9 kg)   BMI 20.07 kg/m   BP Readings from Last 3 Encounters:  11/18/18 133/68  10/29/18 117/65  10/15/18 104/64    Wt Readings from Last 3 Encounters:  11/18/18 132 lb (59.9 kg)  10/29/18 134 lb (60.8 kg)  10/15/18 132 lb 9.6 oz (60.1 kg)     Physical Exam Vitals signs reviewed.  Constitutional:      Appearance: [name] is well-developed.  HENT:     Head: Normocephalic and atraumatic.     Right Ear: Tympanic membrane and external ear normal. No decreased hearing noted.     Left Ear: Tympanic membrane and external ear normal. No decreased hearing noted.     Mouth/Throat:     Pharynx: No oropharyngeal exudate or posterior oropharyngeal erythema.  Eyes:     Pupils: Pupils are equal, round, and reactive to light.  Neck:     Musculoskeletal: Normal range of  motion and neck supple.  Cardiovascular:     Rate and Rhythm: Normal rate and regular rhythm.     Heart sounds: No murmur.  Pulmonary:     Effort: No respiratory distress.     Breath sounds: Normal breath sounds.  Abdominal:     General: Bowel sounds are normal.     Palpations: Abdomen is soft. There is no mass.     Tenderness: There is no abdominal tenderness.  Psychiatric:        Attention and Perception: Attention and perception normal.        Mood and  Affect: Mood normal.        Speech: Speech normal.        Behavior: Behavior normal.        Cognition and Memory: [name] exhibits impaired recent memory.        Judgment: Judgment normal.       Assessment & Plan:   [name] was seen today for atrial fibrillation.  Diagnoses and all orders for this visit:  Aortic valve replacement -     CoaguChek XS/INR Waived  AF (paroxysmal atrial fibrillation) (HCC)       I am having [name] maintain his multivitamin, docusate sodium, potassium chloride, furosemide, latanoprost, dorzolamide, brimonidine-timolol, levothyroxine, pantoprazole, COUMADIN, and tamsulosin.  Allergies as of 11/18/2018      Reactions   Ivp Dye [iodinated Diagnostic Agents] Swelling   Swelling   Nabumetone Hives   Codeine Nausea Only   Nausea   Sulfonamide Derivatives Hives, Itching   Hives itching      Medication List       Accurate as of November 18, 2018  5:16 PM. Always use your most recent med list.        COMBIGAN 0.2-0.5 % ophthalmic solution Generic drug:  brimonidine-timolol Place 1 drop into the right eye 2 (two) times daily.   COUMADIN 3 MG tablet Generic drug:  warfarin Take as directed by the anticoagulation clinic. If you are unsure how to take this medication, talk to your nurse or doctor. Original instructions:  TAKE 1 DAILY EXCEPT 1/2 TABLET ON MONDAY AND FRIDAY   docusate sodium 100 MG capsule Commonly known as:  COLACE Take 100 mg by mouth daily.   dorzolamide 2 % ophthalmic solution Commonly known as:  TRUSOPT Place 1 drop into the right eye 2 (two) times daily.   furosemide 20 MG tablet Commonly known as:  LASIX TAKE 1 TABLET BY MOUTH EVERY DAY   latanoprost 0.005 % ophthalmic solution Commonly known as:  XALATAN Place 1 drop into both eyes at bedtime.   levothyroxine 88 MCG tablet Commonly known as:  SYNTHROID, LEVOTHROID Take 1 tablet (88 mcg total) by mouth daily.   multivitamin tablet Take 1 tablet by  mouth daily.   pantoprazole 40 MG tablet Commonly known as:  PROTONIX TAKE 1 TABLET EVERY DAY   potassium chloride 10 MEQ tablet Commonly known as:  K-DUR,KLOR-CON TAKE 1 TABLET (10 MEQ TOTAL) BY MOUTH EVERY MORNING.   tamsulosin 0.4 MG Caps capsule Commonly known as:  FLOMAX TAKE 1 CAPSULE (0.4 MG TOTAL) BY MOUTH DAILY AFTER BREAKFAST.        Follow-up: Return in about 1 month (around 12/19/2018).  [name], M.D.

## 2018-11-19 ENCOUNTER — Encounter: Payer: Self-pay | Admitting: Internal Medicine

## 2018-11-19 ENCOUNTER — Ambulatory Visit: Payer: Medicare HMO | Admitting: Internal Medicine

## 2018-11-19 VITALS — BP 102/62 | HR 70 | Ht 68.0 in | Wt 131.2 lb

## 2018-11-19 DIAGNOSIS — I495 Sick sinus syndrome: Secondary | ICD-10-CM | POA: Diagnosis not present

## 2018-11-19 DIAGNOSIS — I480 Paroxysmal atrial fibrillation: Secondary | ICD-10-CM | POA: Diagnosis not present

## 2018-11-19 DIAGNOSIS — I493 Ventricular premature depolarization: Secondary | ICD-10-CM | POA: Diagnosis not present

## 2018-11-19 DIAGNOSIS — Z950 Presence of cardiac pacemaker: Secondary | ICD-10-CM

## 2018-11-19 NOTE — Progress Notes (Signed)
Patient Care Team: Mechele ClaudeStacks, Warren, MD as PCP - General (Family Medicine) Ernesto RutherfordGroat, Robert, MD as Consulting Physician (Ophthalmology) Duke SalviaKlein, Alaia Lordi C, MD as Consulting Physician (Cardiology)   HPI  Stanley PriceSamuel A [name] is a 85 y.o. male Seen in follow-up for carotid hypersensitivity and syncope. He is status post Biotronik CLS pacemaker implantation.  Also significant ventricular ectopy causing functional bradycardia suppressed with dronaderone.  He has a history of mechanical aortic valve he takes aspirin and Coumadin. He has a history of a craniotomy following a subdural hematoma.  Echocardiogram 11/17 normal left ventricular function normal valve function DATE TEST EF   11/17 Echo   55-65 %   1/19 Echo   35-40 %          LV dysfunction had also intervened it was not clear whether this is related to PVCs or ventricular pacing  Problems with intercurrent heart failure manifested by peripheral edema; at this point, he denies peripheral edema, nocturnal dyspnea orthopnea or chest discomfort.  His biggest issue is weight loss related to anorexia.  Date Cr Hgb  1/18 1.36 11.7  1/19 1.39 11.0  6/19 1.30 11.2        Records and Results Reviewed   Past Medical History:  Diagnosis Date  . Aortic stenosis    St.Jude AVR in 1985; Echo 8/12: EF 50-55%, mild LVH, mech AV with mild AI, mean gradient 14 mmHg, mild to mod MR;  Echo 8/13: EF 60-65%, AVR ok with mean gradient 19 mmHg  . CAD (coronary artery disease)    Mild, nonobstructive at catheterization 5/01  . Carotid stenosis    Dopplers 8/13: RICA 40-50% and LICA 0-39% => followup 1 year  . Cataract   . CVA (cerebral infarction) 12/2010  . DVT (deep venous thrombosis) (HCC)   . History of pulmonary embolism    post op DVT/PE;  s/p IVC filter  . History of rib fracture   . History of shingles   . Hyperlipidemia   . Hypothyroidism   . Pacemaker  Biotronik   . Personal history of subdural hematoma    Requiring craniotomy    . S/P cholecystectomy 2005  . S/P shoulder surgery    left  . Stroke (HCC) 2012  . Syncope    + carotid sinus massage    Past Surgical History:  Procedure Laterality Date  . AORTIC VALVE REPLACEMENT  1985   St. Jude / mechanical  . BRAIN SURGERY    . CARDIAC CATHETERIZATION    . EYE SURGERY     bilateral cataract  . PERMANENT PACEMAKER INSERTION N/A 08/01/2012   Procedure: PERMANENT PACEMAKER INSERTION;  Surgeon: Duke SalviaSteven C Aeron Lheureux, MD;  [facility];  Service: Cardiovascular;  Laterality: N/A;  . VASCULAR SURGERY      Current Outpatient Medications  Medication Sig Dispense Refill  . brimonidine-timolol (COMBIGAN) 0.2-0.5 % ophthalmic solution Place 1 drop into the right eye 2 (two) times daily.    Marland Kitchen. COUMADIN 3 MG tablet TAKE 1 DAILY EXCEPT 1/2 TABLET ON MONDAY AND FRIDAY (Patient taking differently: Take 1/2 tablet every other day, and 1 tablet every other day.) 90 tablet 0  . docusate sodium (COLACE) 100 MG capsule Take 100 mg by mouth daily.     . dorzolamide (TRUSOPT) 2 % ophthalmic solution Place 1 drop into the right eye 2 (two) times daily.    . furosemide (LASIX) 20 MG tablet Take 20 mg by mouth daily as needed for fluid or  edema.    . latanoprost (XALATAN) 0.005 % ophthalmic solution Place 1 drop into both eyes at bedtime.    Marland Kitchen levothyroxine (SYNTHROID, LEVOTHROID) 88 MCG tablet Take 1 tablet (88 mcg total) by mouth daily. 90 tablet 8  . Multiple Vitamin (MULTIVITAMIN) tablet Take 1 tablet by mouth daily.      . pantoprazole (PROTONIX) 40 MG tablet Take 40 mg by mouth daily.    . potassium chloride (K-DUR,KLOR-CON) 10 MEQ tablet Take 10 mEq by mouth daily as needed.    . tamsulosin (FLOMAX) 0.4 MG CAPS capsule TAKE 1 CAPSULE (0.4 MG TOTAL) BY MOUTH DAILY AFTER BREAKFAST. 90 capsule 1   No current facility-administered medications for this visit.     Allergies  Allergen Reactions  . Ivp Dye [Iodinated Diagnostic Agents] Swelling    Swelling   . Nabumetone  Hives  . Codeine Nausea Only    Nausea   . Sulfonamide Derivatives Hives and Itching    Hives itching       Review of Systems negative except from HPI and PMH  Physical Exam BP 102/62   Pulse 70   Ht 5\' 8"  (1.727 m)   Wt 131 lb 3.2 oz (59.5 kg)   SpO2 98%   BMI 19.95 kg/m   Well developed and nourished in no acute distress HENT normal Neck supple with JVP-flat Clear Regular rate and rhythm, no murmurs or gallops Abd-soft with active BS No Clubbing cyanosis edema Skin-warm and dry A & Oriented  Grossly normal sensory and motor function  ECG  Apacing with intermittent PACs with intrinsic conduction   Assessment and  Plan  Carotid sinus hypersensitivity   Sinus node dysfunction/chronotropic incompetence  Pacemaker-Biotronik  Hypertension  Heart failure acute-on chronic presumed diastolic  PVCs  Atrial fibrillation  Aortic valve replacement-mechanical  Anorexia     On Anticoagulation;  No bleeding issues   No sustained intercurrent atrial fibrillation or flutter  PVCs extinguished by multaq; now off 2/2 rash  Improved   Euvolemic continue current meds On Anticoagulation;  No bleeding issues   I made a mistake and I thought that his labs are being followed by his PCP and that those records were not available in epic.  I have reached out to his PCP; their records are in epic.  With his anorexia, we will check his LFTs and renal function.  We spent more than 50% of our >25 min visit in face to face counseling regarding the above        Current medicines are reviewed at length with the patient today .  The patient does not  have concerns regarding medicines.

## 2018-11-19 NOTE — Patient Instructions (Addendum)
Medication Instructions:  Your physician recommends that you continue on your current medications as directed. Please refer to the Current Medication list given to you today.  Labwork: None ordered.  Testing/Procedures: None ordered.  Follow-Up: Your physician recommends that you schedule a follow-up appointment in:   6 months with the [facility]  One Year with Dr Klein    Any Other Special Instructions Will Be Listed Below (If Applicable).     If you need a refill on your cardiac medications before your next appointment, please call your pharmacy.  

## 2018-11-20 LAB — CUP PACEART INCLINIC DEVICE CHECK
Date Time Interrogation Session: 20200107194700
Implantable Lead Implant Date: 20130919
Implantable Lead Location: 753859
Implantable Pulse Generator Implant Date: 20130919
Lead Channel Impedance Value: 487 Ohm
Lead Channel Pacing Threshold Amplitude: 0.8 V
Lead Channel Pacing Threshold Amplitude: 0.8 V
Lead Channel Pacing Threshold Amplitude: 0.8 V
Lead Channel Pacing Threshold Pulse Width: 0.4 ms
Lead Channel Sensing Intrinsic Amplitude: 13.1 mV
Lead Channel Sensing Intrinsic Amplitude: 2.8 mV
Lead Channel Sensing Intrinsic Amplitude: 2.8 mV
Lead Channel Setting Pacing Amplitude: 2.1 V
Lead Channel Setting Pacing Pulse Width: 0.4 ms
MDC IDC LEAD IMPLANT DT: 20130919
MDC IDC LEAD LOCATION: 753860
MDC IDC MSMT LEADCHNL RA IMPEDANCE VALUE: 448 Ohm
MDC IDC MSMT LEADCHNL RA PACING THRESHOLD PULSEWIDTH: 0.4 ms
MDC IDC MSMT LEADCHNL RV PACING THRESHOLD AMPLITUDE: 0.8 V
MDC IDC MSMT LEADCHNL RV PACING THRESHOLD PULSEWIDTH: 0.4 ms
MDC IDC MSMT LEADCHNL RV PACING THRESHOLD PULSEWIDTH: 0.4 ms
MDC IDC SET LEADCHNL RV PACING AMPLITUDE: 2 V
Pulse Gen Serial Number: 66244569

## 2018-12-10 DIAGNOSIS — H0102B Squamous blepharitis left eye, upper and lower eyelids: Secondary | ICD-10-CM | POA: Diagnosis not present

## 2018-12-10 DIAGNOSIS — H04123 Dry eye syndrome of bilateral lacrimal glands: Secondary | ICD-10-CM | POA: Diagnosis not present

## 2018-12-10 DIAGNOSIS — H0102A Squamous blepharitis right eye, upper and lower eyelids: Secondary | ICD-10-CM | POA: Diagnosis not present

## 2018-12-10 DIAGNOSIS — H401113 Primary open-angle glaucoma, right eye, severe stage: Secondary | ICD-10-CM | POA: Diagnosis not present

## 2018-12-10 DIAGNOSIS — H401121 Primary open-angle glaucoma, left eye, mild stage: Secondary | ICD-10-CM | POA: Diagnosis not present

## 2018-12-10 DIAGNOSIS — Z961 Presence of intraocular lens: Secondary | ICD-10-CM | POA: Diagnosis not present

## 2018-12-23 ENCOUNTER — Ambulatory Visit (INDEPENDENT_AMBULATORY_CARE_PROVIDER_SITE_OTHER): Payer: Medicare HMO | Admitting: Family Medicine

## 2018-12-23 ENCOUNTER — Encounter: Payer: Self-pay | Admitting: Family Medicine

## 2018-12-23 VITALS — BP 131/74 | HR 81 | Temp 98.2°F | Ht 68.0 in | Wt 129.5 lb

## 2018-12-23 DIAGNOSIS — E039 Hypothyroidism, unspecified: Secondary | ICD-10-CM

## 2018-12-23 DIAGNOSIS — R630 Anorexia: Secondary | ICD-10-CM

## 2018-12-23 DIAGNOSIS — I480 Paroxysmal atrial fibrillation: Secondary | ICD-10-CM | POA: Diagnosis not present

## 2018-12-23 LAB — COAGUCHEK XS/INR WAIVED
INR: 2 — ABNORMAL HIGH (ref 0.9–1.1)
PROTHROMBIN TIME: 23.6 s

## 2018-12-23 MED ORDER — MEGESTROL ACETATE 400 MG/10ML PO SUSP: 400.0000 mg | Freq: Two times a day (BID) | ORAL | 2 refills | Status: DC

## 2018-12-23 NOTE — Progress Notes (Signed)
Subjective:  Patient ID: [name], male    DOB: [birth year]  Age: 85 y.o. MRN: [id number]  CC: Medical Management of Chronic Issues (pt here today for INR/Protime)   HPI [name] presents for Patient in for follow-up of atrial fibrillation. Patient denies any recent bouts of chest pain or palpitations. Additionally, patient is taking anticoagulants. Patient denies any recent excessive bleeding episodes including epistaxis, bleeding from the gums, genitalia, rectal bleeding or hematuria. Additionally there has been no excessive bruising.  Family is concerned that he is not eating well.  He is concerned about weight loss.  He he is down 3 pounds this month.  He is taking 3 cans a day of Ensure.  Caregiver says that he does not eat a lot at meals. Patient presents for follow-up on  thyroid. The patient has a history of hypothyroidism for many years. It has been stable recently. Pt. denies any change in  voice, loss of hair, heat or cold intolerance. Energy level has been adequate. Patient denies constipation and diarrhea. No myxedema.  Depression screen [facility] 2/9 11/18/2018 10/29/2018 10/15/2018  Decreased Interest 0 0 0  Down, Depressed, Hopeless 0 1 0  PHQ - 2 Score 0 1 0  Altered sleeping 0 - -  Tired, decreased energy 0 - -  Change in appetite 0 - -  Feeling bad or failure about yourself  0 - -  Trouble concentrating 0 - -  Moving slowly or fidgety/restless 0 - -  Suicidal thoughts 0 - -  PHQ-9 Score 0 - -  Difficult doing work/chores Not difficult at all - -  Some recent data might be hidden    History [name] has a past medical history of Aortic stenosis, CAD (coronary artery disease), Carotid stenosis, Cataract, CVA (cerebral infarction) (12/2010), DVT (deep venous thrombosis) ([place]), History of pulmonary embolism, History of rib fracture, History of shingles, Hyperlipidemia, Hypothyroidism, Pacemaker  Biotronik, Personal history of subdural hematoma, S/P cholecystectomy  (2005), S/P shoulder surgery, Stroke ([place]) (2012), and Syncope.   He has a past surgical history that includes Aortic valve replacement (1985); Cardiac catheterization; Vascular surgery; Brain surgery; permanent pacemaker insertion (N/A, 08/01/2012); and Eye surgery.   His family history includes Arthritis in his sister; Cancer in his father; Coronary artery disease in an other family member; Heart attack in his brother, brother, and father; Heart disease in his brother and father; Stroke in his mother.He reports that he has never smoked. He has never used smokeless tobacco. He reports that he does not drink alcohol or use drugs.    ROS Review of Systems  Constitutional: Negative for fever.  Respiratory: Negative for shortness of breath.   Cardiovascular: Negative for chest pain.  Musculoskeletal: Negative for arthralgias.  Skin: Negative for rash.    Objective:  BP 131/74   Pulse 81   Temp 98.2 F (36.8 C) (Oral)   Ht '5\' 8"'$  (1.727 m)   Wt 129 lb 8 oz (58.7 kg)   BMI 19.69 kg/m   BP Readings from Last 3 Encounters:  12/23/18 131/74  11/19/18 102/62  11/18/18 133/68    Wt Readings from Last 3 Encounters:  12/23/18 129 lb 8 oz (58.7 kg)  11/19/18 131 lb 3.2 oz (59.5 kg)  11/18/18 132 lb (59.9 kg)     Physical Exam Vitals signs reviewed.  Constitutional:      Appearance: He is well-developed.  HENT:     Head: Normocephalic and atraumatic.     Right Ear:  Tympanic membrane and external ear normal. No decreased hearing noted.     Left Ear: Tympanic membrane and external ear normal. No decreased hearing noted.     Mouth/Throat:     Pharynx: No oropharyngeal exudate or posterior oropharyngeal erythema.  Eyes:     Pupils: Pupils are equal, round, and reactive to light.  Neck:     Musculoskeletal: Normal range of motion and neck supple.  Cardiovascular:     Rate and Rhythm: Normal rate. Rhythm irregular.     Heart sounds: Murmur present.  Pulmonary:     Effort: No  respiratory distress.     Breath sounds: Normal breath sounds.  Abdominal:     General: Bowel sounds are normal.     Palpations: Abdomen is soft. There is no mass.     Tenderness: There is no abdominal tenderness.       Assessment & Plan:   [name] was seen today for medical management of chronic issues.  Diagnoses and all orders for this visit:  AF (paroxysmal atrial fibrillation) (HCC) -     CoaguChek XS/INR Waived  Anorexia -     Hepatic function panel -     CMP14+EGFR -     TSH -     T4, Free  Hypothyroidism, unspecified type  Other orders -     megestrol (MEGACE) 400 MG/10ML suspension; Take 10 mLs (400 mg total) by mouth 2 (two) times daily. For appetite stimulation       I am having [name] start on megestrol. I am also having him maintain his multivitamin, docusate sodium, latanoprost, dorzolamide, brimonidine-timolol, levothyroxine, COUMADIN, tamsulosin, pantoprazole, furosemide, and potassium chloride.  Allergies as of 12/23/2018      Reactions   Ivp Dye [iodinated Diagnostic Agents] Swelling   Swelling   Nabumetone Hives   Codeine Nausea Only   Nausea   Sulfonamide Derivatives Hives, Itching   Hives itching      Medication List       Accurate as of December 23, 2018 11:41 AM. Always use your most recent med list.        COMBIGAN 0.2-0.5 % ophthalmic solution Generic drug:  brimonidine-timolol Place 1 drop into the right eye 2 (two) times daily.   COUMADIN 3 MG tablet Generic drug:  warfarin Take as directed by the anticoagulation clinic. If you are unsure how to take this medication, talk to your nurse or doctor. Original instructions:  TAKE 1 DAILY EXCEPT 1/2 TABLET ON MONDAY AND FRIDAY   docusate sodium 100 MG capsule Commonly known as:  COLACE Take 100 mg by mouth daily.   dorzolamide 2 % ophthalmic solution Commonly known as:  TRUSOPT Place 1 drop into the right eye 2 (two) times daily.   furosemide 20 MG  tablet Commonly known as:  LASIX Take 20 mg by mouth daily as needed for fluid or edema.   latanoprost 0.005 % ophthalmic solution Commonly known as:  XALATAN Place 1 drop into both eyes at bedtime.   levothyroxine 88 MCG tablet Commonly known as:  SYNTHROID, LEVOTHROID Take 1 tablet (88 mcg total) by mouth daily.   megestrol 400 MG/10ML suspension Commonly known as:  MEGACE Take 10 mLs (400 mg total) by mouth 2 (two) times daily. For appetite stimulation   multivitamin tablet Take 1 tablet by mouth daily.   pantoprazole 40 MG tablet Commonly known as:  PROTONIX Take 40 mg by mouth daily.   potassium chloride 10 MEQ tablet Commonly known as:  K-DUR,KLOR-CON Take 10 mEq by mouth daily as needed.   tamsulosin 0.4 MG Caps capsule Commonly known as:  FLOMAX TAKE 1 CAPSULE (0.4 MG TOTAL) BY MOUTH DAILY AFTER BREAKFAST.        Follow-up: Return in about 1 month (around 01/21/2019).  Claretta Fraise, M.D.

## 2018-12-24 LAB — CMP14+EGFR
A/G RATIO: 1.2 (ref 1.2–2.2)
ALBUMIN: 3.7 g/dL (ref 3.6–4.6)
ALT: 10 IU/L (ref 0–44)
AST: 23 IU/L (ref 0–40)
Alkaline Phosphatase: 83 IU/L (ref 39–117)
BUN / CREAT RATIO: 22 (ref 10–24)
BUN: 24 mg/dL (ref 8–27)
Bilirubin Total: 0.5 mg/dL (ref 0.0–1.2)
CHLORIDE: 104 mmol/L (ref 96–106)
CO2: 26 mmol/L (ref 20–29)
Calcium: 9 mg/dL (ref 8.6–10.2)
Creatinine, Ser: 1.1 mg/dL (ref 0.76–1.27)
GFR calc Af Amer: 69 mL/min/{1.73_m2} (ref >59)
GFR calc non Af Amer: 60 mL/min/{1.73_m2} (ref >59)
Globulin, Total: 3.2 g/dL (ref 1.5–4.5)
Glucose: 93 mg/dL (ref 65–99)
POTASSIUM: 4.2 mmol/L (ref 3.5–5.2)
Sodium: 139 mmol/L (ref 134–144)
TOTAL PROTEIN: 6.9 g/dL (ref 6.0–8.5)

## 2018-12-24 LAB — HEPATIC FUNCTION PANEL: Bilirubin, Direct: 0.18 mg/dL (ref 0.00–0.40)

## 2018-12-24 LAB — T4, FREE: Free T4: 1.6 ng/dL (ref 0.82–1.77)

## 2018-12-24 LAB — TSH: TSH: 0.547 u[IU]/mL (ref 0.450–4.500)

## 2018-12-24 NOTE — Progress Notes (Signed)
Hello [name],  Your lab result is normal.Some minor variations that are not significant are commonly marked abnormal, but do not represent any medical problem for you.  Best regards, [name], M.D.

## 2019-01-06 ENCOUNTER — Telehealth: Payer: Self-pay | Admitting: Family Medicine

## 2019-01-06 NOTE — Telephone Encounter (Signed)
Son states that patient's dementia is progressing and is doing inappropriate and out of character things with caretaker that comes in daily for 12 hours.  Care taker will continue to come in and patient may not be able to continue to be home for much longer and may need placement.

## 2019-01-06 NOTE — Telephone Encounter (Signed)
I can call, but will probably be evening. Just let them know. Thanks.

## 2019-01-24 ENCOUNTER — Other Ambulatory Visit: Payer: Self-pay

## 2019-01-24 ENCOUNTER — Encounter: Payer: Self-pay | Admitting: Family Medicine

## 2019-01-24 ENCOUNTER — Ambulatory Visit (INDEPENDENT_AMBULATORY_CARE_PROVIDER_SITE_OTHER): Payer: Medicare HMO | Admitting: Family Medicine

## 2019-01-24 VITALS — BP 120/57 | HR 80 | Temp 98.1°F | Ht 68.0 in | Wt 131.1 lb

## 2019-01-24 DIAGNOSIS — I480 Paroxysmal atrial fibrillation: Secondary | ICD-10-CM

## 2019-01-24 LAB — COAGUCHEK XS/INR WAIVED
INR: 2 — ABNORMAL HIGH (ref 0.9–1.1)
Prothrombin Time: 23.5 s

## 2019-01-24 NOTE — Progress Notes (Signed)
Subjective:  Patient ID: [name], male    DOB: [birth year]  Age: 85 y.o. MRN: [id number]  CC: Atrial Fibrillation (Protime )   HPI [name] presents for . Patient in for follow-up of atrial fibrillation. Patient denies any recent bouts of chest pain or palpitations. Additionally, patient is taking anticoagulants. Patient denies any recent excessive bleeding episodes including epistaxis, bleeding from the gums, genitalia, rectal bleeding or hematuria. Additionally there has been no excessive bruising.  Depression screen [facility] 2/9 01/24/2019 11/18/2018 10/29/2018  Decreased Interest 0 0 0  Down, Depressed, Hopeless 0 0 1  PHQ - 2 Score 0 0 1  Altered sleeping - 0 -  Tired, decreased energy - 0 -  Change in appetite - 0 -  Feeling bad or failure about yourself  - 0 -  Trouble concentrating - 0 -  Moving slowly or fidgety/restless - 0 -  Suicidal thoughts - 0 -  PHQ-9 Score - 0 -  Difficult doing work/chores - Not difficult at all -  Some recent data might be hidden    History [name] has a past medical history of Aortic stenosis, CAD (coronary artery disease), Carotid stenosis, Cataract, CVA (cerebral infarction) (12/2010), DVT (deep venous thrombosis) (HCC), History of pulmonary embolism, History of rib fracture, History of shingles, Hyperlipidemia, Hypothyroidism, Pacemaker  Biotronik, Personal history of subdural hematoma, S/P cholecystectomy (2005), S/P shoulder surgery, Stroke (HCC) (2012), and Syncope.   He has a past surgical history that includes Aortic valve replacement (1985); Cardiac catheterization; Vascular surgery; Brain surgery; permanent pacemaker insertion (N/A, 08/01/2012); and Eye surgery.   His family history includes Arthritis in his sister; Cancer in his father; Coronary artery disease in an other family member; Heart attack in his brother, brother, and father; Heart disease in his brother and father; Stroke in his mother.He reports that he has never  smoked. He has never used smokeless tobacco. He reports that he does not drink alcohol or use drugs.    ROS Review of Systems  Constitutional: Negative for fever.  Respiratory: Negative for shortness of breath.   Cardiovascular: Negative for chest pain.  Musculoskeletal: Negative for arthralgias.  Skin: Negative for rash.    Objective:  BP (!) 120/57    Pulse 80    Temp 98.1 F (36.7 C) (Oral)    Ht 5\' 8"  (1.727 m)    Wt 131 lb 2 oz (59.5 kg)    BMI 19.94 kg/m   BP Readings from Last 3 Encounters:  01/24/19 (!) 120/57  12/23/18 131/74  11/19/18 102/62    Wt Readings from Last 3 Encounters:  01/24/19 131 lb 2 oz (59.5 kg)  12/23/18 129 lb 8 oz (58.7 kg)  11/19/18 131 lb 3.2 oz (59.5 kg)     Physical Exam Vitals signs reviewed.  Constitutional:      Appearance: He is well-developed.  HENT:     Head: Normocephalic and atraumatic.     Right Ear: External ear normal.     Left Ear: External ear normal.     Mouth/Throat:     Pharynx: No oropharyngeal exudate or posterior oropharyngeal erythema.  Eyes:     Pupils: Pupils are equal, round, and reactive to light.  Neck:     Musculoskeletal: Normal range of motion and neck supple.  Cardiovascular:     Rate and Rhythm: Normal rate and regular rhythm.     Heart sounds: No murmur.  Pulmonary:     Effort: No respiratory distress.  Breath sounds: Normal breath sounds.  Neurological:     Mental Status: He is alert and oriented to person, place, and time.       Assessment & Plan:   [name] was seen today for atrial fibrillation.  Diagnoses and all orders for this visit:  AF (paroxysmal atrial fibrillation) (HCC) -     CoaguChek XS/INR Waived       I am having [name] his multivitamin, docusate sodium, latanoprost, dorzolamide, brimonidine-timolol, levothyroxine, Coumadin, tamsulosin, pantoprazole, furosemide, potassium chloride, and megestrol.  Allergies as of 01/24/2019      Reactions    Ivp Dye [iodinated Diagnostic Agents] Swelling   Swelling   Nabumetone Hives   Codeine Nausea Only   Nausea   Sulfonamide Derivatives Hives, Itching   Hives itching      Medication List       Accurate as of January 24, 2019  9:53 PM. Always use your most recent med list.        Combigan 0.2-0.5 % ophthalmic solution Generic drug:  brimonidine-timolol Place 1 drop into the right eye 2 (two) times daily.   Coumadin 3 MG tablet Generic drug:  warfarin Take as directed by the anticoagulation clinic. If you are unsure how to take this medication, talk to your nurse or doctor. Original instructions:  TAKE 1 DAILY EXCEPT 1/2 TABLET ON MONDAY AND FRIDAY   docusate sodium 100 MG capsule Commonly known as:  COLACE Take 100 mg by mouth daily.   dorzolamide 2 % ophthalmic solution Commonly known as:  TRUSOPT Place 1 drop into the right eye 2 (two) times daily.   furosemide 20 MG tablet Commonly known as:  LASIX Take 20 mg by mouth daily as needed for fluid or edema.   latanoprost 0.005 % ophthalmic solution Commonly known as:  XALATAN Place 1 drop into both eyes at bedtime.   levothyroxine 88 MCG tablet Commonly known as:  SYNTHROID, LEVOTHROID Take 1 tablet (88 mcg total) by mouth daily.   megestrol 400 MG/10ML suspension Commonly known as:  MEGACE Take 10 mLs (400 mg total) by mouth 2 (two) times daily. For appetite stimulation   multivitamin tablet Take 1 tablet by mouth daily.   pantoprazole 40 MG tablet Commonly known as:  PROTONIX Take 40 mg by mouth daily.   potassium chloride 10 MEQ tablet Commonly known as:  K-DUR,KLOR-CON Take 10 mEq by mouth daily as needed.   tamsulosin 0.4 MG Caps capsule Commonly known as:  FLOMAX TAKE 1 CAPSULE (0.4 MG TOTAL) BY MOUTH DAILY AFTER BREAKFAST.        Follow-up: Return in about 1 month (around 02/24/2019).  [name], M.D.

## 2019-01-28 ENCOUNTER — Other Ambulatory Visit: Payer: Self-pay | Admitting: Family Medicine

## 2019-03-04 ENCOUNTER — Ambulatory Visit (INDEPENDENT_AMBULATORY_CARE_PROVIDER_SITE_OTHER): Payer: Medicare HMO | Admitting: Family Medicine

## 2019-03-04 ENCOUNTER — Encounter: Payer: Self-pay | Admitting: Family Medicine

## 2019-03-04 ENCOUNTER — Other Ambulatory Visit: Payer: Self-pay

## 2019-03-04 DIAGNOSIS — M150 Primary generalized (osteo)arthritis: Secondary | ICD-10-CM

## 2019-03-04 DIAGNOSIS — Z952 Presence of prosthetic heart valve: Secondary | ICD-10-CM

## 2019-03-04 DIAGNOSIS — Z7901 Long term (current) use of anticoagulants: Secondary | ICD-10-CM | POA: Diagnosis not present

## 2019-03-04 DIAGNOSIS — M159 Polyosteoarthritis, unspecified: Secondary | ICD-10-CM

## 2019-03-04 DIAGNOSIS — I482 Chronic atrial fibrillation, unspecified: Secondary | ICD-10-CM | POA: Diagnosis not present

## 2019-03-04 NOTE — Progress Notes (Signed)
Subjective:    Patient ID: [name], male    DOB: [birth year], 85 y.o.   MRN: [id number]  CC: Atrial Fibrillation and Osteoarthritis (affecting the right shoulder)   HPI: [name] is a 85 y.o. male presenting for Atrial Fibrillation and Osteoarthritis (affecting the right shoulder)  Shoulder hurt a lot yesterday. Gave him tylenol. Felt better later in the day. Feeling okay today. Eating and walking around okay. Seemed to be more confused wile having the pain. That resolved as well. Having a good day today. Son, [name], is concerned that pt. Missed 2 doses of coumadin on two Saturdays. Wants to get a home monitor for INR. Pt. Is unsteady, and due to past history of brain bleed, as well as having had aortic valve replacement, it is essential to keep a tight control over his INR. This is difficult to accomplish under the circumstances,  His fall risk is high when coming to the office as well.    Depression screen [facility] 2/9 01/24/2019 11/18/2018 10/29/2018 10/15/2018 08/29/2018  Decreased Interest 0 0 0 0 0  Down, Depressed, Hopeless 0 0 1 0 0  PHQ - 2 Score 0 0 1 0 0  Altered sleeping - 0 - - -  Tired, decreased energy - 0 - - -  Change in appetite - 0 - - -  Feeling bad or failure about yourself  - 0 - - -  Trouble concentrating - 0 - - -  Moving slowly or fidgety/restless - 0 - - -  Suicidal thoughts - 0 - - -  PHQ-9 Score - 0 - - -  Difficult doing work/chores - Not difficult at all - - -  Some recent data might be hidden     Relevant past medical, surgical, family and social history reviewed and updated as indicated.  Interim medical history since our last visit reviewed. Allergies and medications reviewed and updated.  ROS:  Review of Systems  Constitutional: Negative for fever.  Respiratory: Negative for shortness of breath.   Cardiovascular: Negative for chest pain.  Musculoskeletal: Negative for arthralgias.  Skin: Negative for rash.   Psychiatric/Behavioral: Positive for confusion (having good days and bad days. Pain makes it worse.).     Social History   Tobacco Use  Smoking Status Never Smoker  Smokeless Tobacco Never Used       Objective:     Wt Readings from Last 3 Encounters:  01/24/19 131 lb 2 oz (59.5 kg)  12/23/18 129 lb 8 oz (58.7 kg)  11/19/18 131 lb 3.2 oz (59.5 kg)     Exam deferred. Pt. Harboring due to COVID 19. Phone visit performed.   Assessment & Plan:   1. Chronic atrial fibrillation   2. S/P AVR (aortic valve replacement)   3. Long term current use of anticoagulant   4. Primary osteoarthritis involving multiple joints     No orders of the defined types were placed in this encounter.   No orders of the defined types were placed in this encounter.     Paz was seen today for atrial fibrillation and osteoarthritis.  Diagnoses and all orders for this visit:  Chronic atrial fibrillation  S/P AVR (aortic valve replacement)  Long term current use of anticoagulant  Primary osteoarthritis involving multiple joints    Virtual Visit via telephone Note  I discussed the limitations, risks, security and privacy concerns of performing an evaluation and management service by telephone and the availability of in person appointments.  The patient was identified with two identifiers. Pt.expressed understanding and agreed to proceed. Pt. Is at home. Dr. Darlyn Read is in his home office.  Follow Up Instructions:   I discussed the assessment and treatment plan with the patient. The patient was provided an opportunity to ask questions and all were answered. The patient agreed with the plan and demonstrated an understanding of the instructions.   The patient was advised to call back or seek an in-person evaluation if the symptoms worsen or if the condition fails to improve as anticipated.  Visit started: 10:28 Call ended:  10:40 Total minutes including chart review and phone contact time: 17    Follow up plan: Return in about 3 months (around 06/03/2019).  [name], MD [place] [facility] 03/04/2019, 12:50 PM

## 2019-03-11 ENCOUNTER — Other Ambulatory Visit: Payer: Self-pay | Admitting: *Deleted

## 2019-03-11 NOTE — Telephone Encounter (Signed)
Erroneous encounter

## 2019-03-12 ENCOUNTER — Ambulatory Visit: Payer: Medicare HMO | Admitting: Family Medicine

## 2019-03-18 ENCOUNTER — Telehealth: Payer: Self-pay

## 2019-03-18 ENCOUNTER — Telehealth: Payer: Self-pay | Admitting: Family Medicine

## 2019-03-18 DIAGNOSIS — Z7901 Long term (current) use of anticoagulants: Secondary | ICD-10-CM | POA: Diagnosis not present

## 2019-03-18 DIAGNOSIS — I4821 Permanent atrial fibrillation: Secondary | ICD-10-CM | POA: Diagnosis not present

## 2019-03-18 NOTE — Telephone Encounter (Signed)
Son aware that he may not change the dose without a DR order.  Aware we will address in a INR encounter and call him once it has been addressed.

## 2019-03-18 NOTE — Telephone Encounter (Signed)
mdINR INR 1.8 on 5/5/21020 at 10:19 am Current dosing:   3 mg Sunday, Tuesday, Thursday and Saturday; 1.5 mg on Monday, Wednesday and Friday Contact son [name] with dosing instructions

## 2019-03-18 NOTE — Telephone Encounter (Signed)
[name] aware and verbalizes understanding per dpr.  

## 2019-03-18 NOTE — Telephone Encounter (Signed)
Continue coumadin as is °

## 2019-03-25 ENCOUNTER — Telehealth: Payer: Self-pay | Admitting: *Deleted

## 2019-03-25 ENCOUNTER — Encounter: Payer: Self-pay | Admitting: *Deleted

## 2019-03-25 DIAGNOSIS — I480 Paroxysmal atrial fibrillation: Secondary | ICD-10-CM

## 2019-03-25 DIAGNOSIS — Z952 Presence of prosthetic heart valve: Secondary | ICD-10-CM

## 2019-03-25 NOTE — Telephone Encounter (Signed)
Continue coumadin as is. Recheck one week 

## 2019-03-25 NOTE — Telephone Encounter (Signed)
Fax received mdINR PT/INR self testing service Test date/time 03/25/19 10:48 am INR 1.7 

## 2019-03-25 NOTE — Telephone Encounter (Signed)
Aware. 

## 2019-03-31 ENCOUNTER — Telehealth: Payer: Self-pay | Admitting: *Deleted

## 2019-03-31 DIAGNOSIS — Z952 Presence of prosthetic heart valve: Secondary | ICD-10-CM

## 2019-03-31 DIAGNOSIS — I480 Paroxysmal atrial fibrillation: Secondary | ICD-10-CM

## 2019-03-31 NOTE — Telephone Encounter (Signed)
Aware. 

## 2019-03-31 NOTE — Telephone Encounter (Signed)
Continue coumadin as is °

## 2019-03-31 NOTE — Telephone Encounter (Signed)
Fax received mdINR PT/INR self testing service Test date/time 03/25/19 10:48 am INR 1.7

## 2019-04-04 ENCOUNTER — Telehealth: Payer: Self-pay

## 2019-04-04 ENCOUNTER — Other Ambulatory Visit: Payer: Self-pay | Admitting: Family Medicine

## 2019-04-04 DIAGNOSIS — I480 Paroxysmal atrial fibrillation: Secondary | ICD-10-CM

## 2019-04-04 DIAGNOSIS — Z952 Presence of prosthetic heart valve: Secondary | ICD-10-CM

## 2019-04-04 LAB — POCT INR: INR: 1.6 — AB (ref 2.0–3.0)

## 2019-04-04 MED ORDER — WARFARIN SODIUM 2 MG PO TABS: ORAL_TABLET | ORAL | 0 refills | Status: DC

## 2019-04-04 MED ORDER — WARFARIN SODIUM 2 MG PO TABS: ORAL_TABLET | ORAL | 2 refills | Status: DC

## 2019-04-04 MED ORDER — COUMADIN 3 MG PO TABS: ORAL_TABLET | ORAL | 0 refills | Status: DC

## 2019-04-04 NOTE — Telephone Encounter (Signed)
Fax received mdINR PT/INR self testing service Test date/time 04/04/2019 2:43 pm INR 1.6

## 2019-04-04 NOTE — Addendum Note (Signed)
Addended by: Bernadene Bell on: 04/04/2019 05:00 PM   Modules accepted: Orders

## 2019-04-04 NOTE — Telephone Encounter (Signed)
Please contact the patient . Take 3 mg today and two  Mg tomorrow, alternating. He has 3 mg pills. I will send in the 2 mg pills.

## 2019-04-04 NOTE — Telephone Encounter (Signed)
Spoke with patient's son, [name].  Advised him to alternate coumadin 3 mg and 2 mg every other day, and recheck in 1 week.  Advised that Dr. Darlyn Read will send Coumadin 2 mg to [facility].

## 2019-04-05 ENCOUNTER — Other Ambulatory Visit: Payer: Self-pay | Admitting: Family Medicine

## 2019-04-05 DIAGNOSIS — N400 Enlarged prostate without lower urinary tract symptoms: Secondary | ICD-10-CM

## 2019-04-08 ENCOUNTER — Telehealth: Payer: Self-pay | Admitting: *Deleted

## 2019-04-08 DIAGNOSIS — Z952 Presence of prosthetic heart valve: Secondary | ICD-10-CM

## 2019-04-08 DIAGNOSIS — I480 Paroxysmal atrial fibrillation: Secondary | ICD-10-CM

## 2019-04-08 DIAGNOSIS — Z7901 Long term (current) use of anticoagulants: Secondary | ICD-10-CM | POA: Diagnosis not present

## 2019-04-08 DIAGNOSIS — I4821 Permanent atrial fibrillation: Secondary | ICD-10-CM | POA: Diagnosis not present

## 2019-04-08 NOTE — Telephone Encounter (Signed)
Fax received mdINR PT/INR self testing service Test date/time 04/08/19 1:36 pm INR 1.9

## 2019-04-08 NOTE — Telephone Encounter (Signed)
Continue coumadin as is °

## 2019-04-08 NOTE — Telephone Encounter (Signed)
Patients son notified and verbalized understanding.

## 2019-04-10 ENCOUNTER — Telehealth: Payer: Self-pay | Admitting: *Deleted

## 2019-04-10 MED ORDER — COUMADIN 2 MG PO TABS: ORAL_TABLET | ORAL | 1 refills | Status: DC

## 2019-04-10 MED ORDER — COUMADIN 3 MG PO TABS: ORAL_TABLET | ORAL | 1 refills | Status: DC

## 2019-04-10 NOTE — Telephone Encounter (Signed)
Pt aware refill sent to pharmacy If refill is going to take awhile we will send to [facility] whom I called and they can get the next day

## 2019-04-15 ENCOUNTER — Telehealth: Payer: Self-pay | Admitting: *Deleted

## 2019-04-15 DIAGNOSIS — I480 Paroxysmal atrial fibrillation: Secondary | ICD-10-CM

## 2019-04-15 DIAGNOSIS — Z952 Presence of prosthetic heart valve: Secondary | ICD-10-CM

## 2019-04-15 LAB — POCT INR: INR: 1.7 — AB (ref 2.0–3.0)

## 2019-04-15 NOTE — Telephone Encounter (Signed)
Please contact the patient . Due to history of bleed, I prefer his INR on the low side. So, please continue coumadin as is. Thanks, WS

## 2019-04-15 NOTE — Telephone Encounter (Signed)
Fax received mdINR PT/INR self testing service Test date/time 04/15/19 1:03 pm INR 1.7

## 2019-04-16 NOTE — Telephone Encounter (Signed)
Aware to continue current dose 

## 2019-04-21 ENCOUNTER — Other Ambulatory Visit: Payer: Self-pay | Admitting: *Deleted

## 2019-04-21 MED ORDER — COUMADIN 3 MG PO TABS: ORAL_TABLET | ORAL | 1 refills | Status: DC

## 2019-04-21 MED ORDER — COUMADIN 2 MG PO TABS: ORAL_TABLET | ORAL | 1 refills | Status: DC

## 2019-04-22 ENCOUNTER — Telehealth: Payer: Self-pay | Admitting: *Deleted

## 2019-04-22 DIAGNOSIS — I480 Paroxysmal atrial fibrillation: Secondary | ICD-10-CM

## 2019-04-22 DIAGNOSIS — Z952 Presence of prosthetic heart valve: Secondary | ICD-10-CM

## 2019-04-22 NOTE — Telephone Encounter (Signed)
Patients son notified and verbalized understanding.

## 2019-04-22 NOTE — Telephone Encounter (Signed)
Continue coumadin as is °

## 2019-04-22 NOTE — Telephone Encounter (Signed)
Fax received mdINR PT/INR self testing service Test date/time 04/22/19 10:47 am INR 2.1

## 2019-04-25 ENCOUNTER — Other Ambulatory Visit: Payer: Self-pay | Admitting: Family Medicine

## 2019-05-01 ENCOUNTER — Ambulatory Visit: Payer: Self-pay | Admitting: Family Medicine

## 2019-05-01 ENCOUNTER — Telehealth: Payer: Self-pay | Admitting: *Deleted

## 2019-05-01 DIAGNOSIS — I480 Paroxysmal atrial fibrillation: Secondary | ICD-10-CM

## 2019-05-01 DIAGNOSIS — Z952 Presence of prosthetic heart valve: Secondary | ICD-10-CM

## 2019-05-01 LAB — POCT INR: INR: 2.1 (ref 2.0–3.0)

## 2019-05-01 NOTE — Telephone Encounter (Signed)
INR gaol is 2.5-3.5.  New regimen: 3 mg on Sunday, Tuesday, Wednesday, Friday   2 mg on Monday, Thursday  Recheck INR in one week.

## 2019-05-01 NOTE — Telephone Encounter (Signed)
Son aware.

## 2019-05-01 NOTE — Telephone Encounter (Signed)
The goal listed in the the chart is 2.5-3.5. If he wants to continue the current regimen, that is fine. He needs an INR recheck in one week.

## 2019-05-01 NOTE — Telephone Encounter (Signed)
Fax received mdINR PT/INR self testing service Test date/time 04/29/19 12:02 pm INR 2.1

## 2019-05-01 NOTE — Telephone Encounter (Signed)
Contacted patient son regarding INR results.  Son states that patients INR goal is 2.0-2.5 and would like to not change his medication at this time.

## 2019-05-01 NOTE — Progress Notes (Signed)
INR 2.1 today, goal 2.5-3.5. Will add additional 3 mg dose day on Tuesdays. Recheck INR in one week.

## 2019-05-12 ENCOUNTER — Telehealth: Payer: Self-pay | Admitting: *Deleted

## 2019-05-12 ENCOUNTER — Telehealth: Payer: Self-pay | Admitting: Family Medicine

## 2019-05-12 DIAGNOSIS — Z952 Presence of prosthetic heart valve: Secondary | ICD-10-CM

## 2019-05-12 DIAGNOSIS — I4821 Permanent atrial fibrillation: Secondary | ICD-10-CM | POA: Diagnosis not present

## 2019-05-12 DIAGNOSIS — I480 Paroxysmal atrial fibrillation: Secondary | ICD-10-CM

## 2019-05-12 DIAGNOSIS — Z7901 Long term (current) use of anticoagulants: Secondary | ICD-10-CM | POA: Diagnosis not present

## 2019-05-12 NOTE — Telephone Encounter (Signed)
Fax received mdINR PT/INR self testing service Test date/time 05/12/19 2:29 pm INR 2.1

## 2019-05-12 NOTE — Telephone Encounter (Signed)
Son aware and verbalizes understanding dpr.

## 2019-05-12 NOTE — Telephone Encounter (Signed)
Pt notified of Dr Stacks recommendation Verbalizes understanding 

## 2019-05-12 NOTE — Telephone Encounter (Signed)
Continue coumadin as is °

## 2019-05-12 NOTE — Telephone Encounter (Signed)
Warfarin is the only option due to his heart valve. WS

## 2019-05-13 ENCOUNTER — Other Ambulatory Visit: Payer: Self-pay | Admitting: Family Medicine

## 2019-05-14 IMAGING — CT CT CERVICAL SPINE W/O CM
3 of 6 series · 14 of 33 positions shown, 16 images · non-contrast
Comparison: CT of the brain February 09, 2014

CLINICAL DATA: Pain after fall.

EXAM:
CT HEAD WITHOUT CONTRAST
CT CERVICAL SPINE WITHOUT CONTRAST
TECHNIQUE: Multidetector CT imaging of the head and cervical spine was
performed following the standard protocol without intravenous
contrast. Multiplanar CT image reconstructions of the cervical spine
were also generated.

[Series 5: coronal soft tissue · coronal · 0.34mm/px · 3 of 77 slices shown]
[im 20/77  bone]
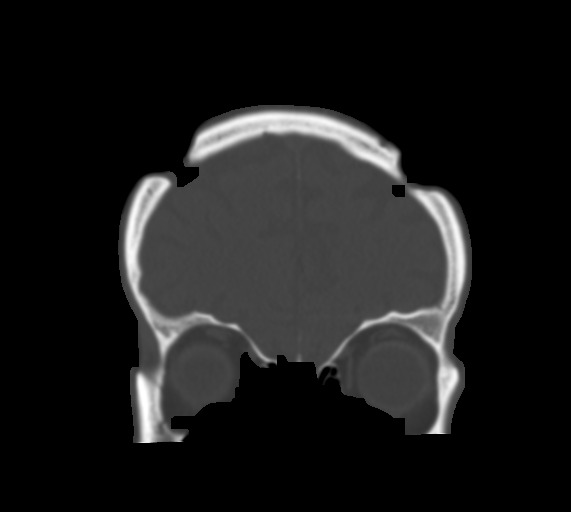
[im 39/77  bone]
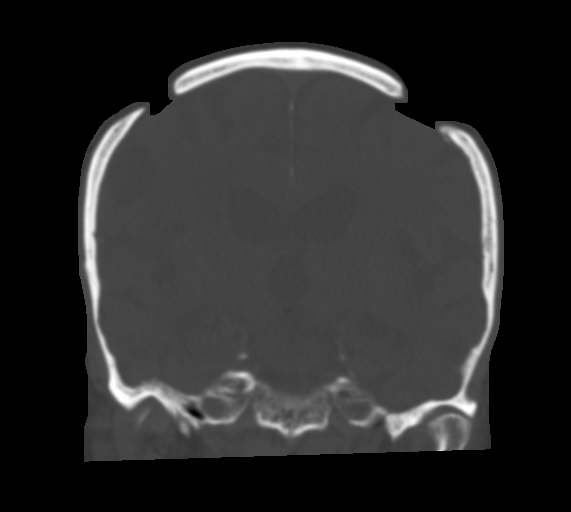
[im 58/77  bone]
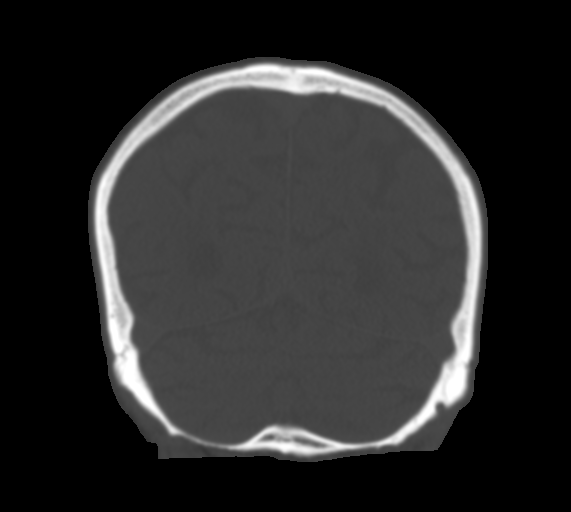

[Series 8: c spine soft · axial · 0.45mm/px · z∈[-118,+22]mm · 7 of 90 slices shown, 9 images]
[im 10/90  soft-tissue]
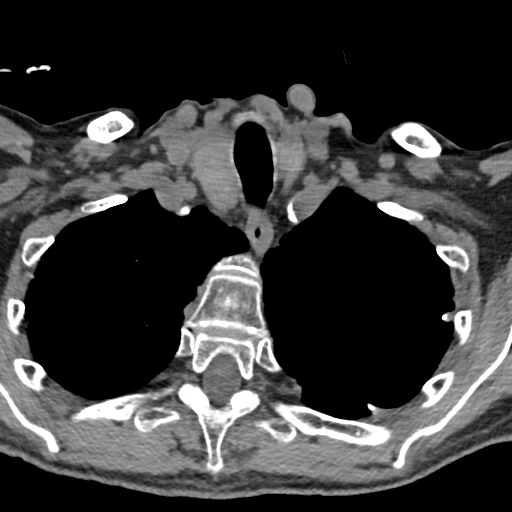
[im 10/90  bone]
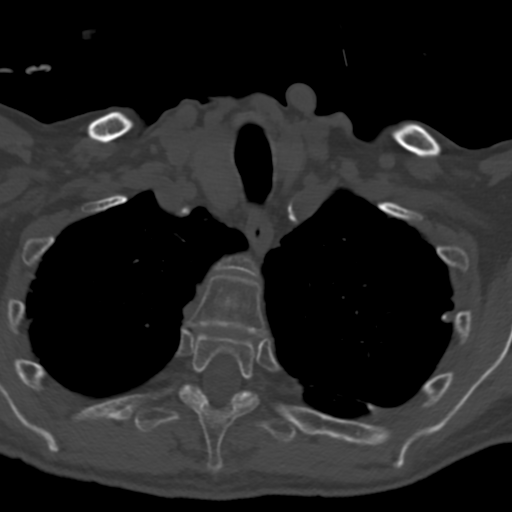
[im 20/90  bone]
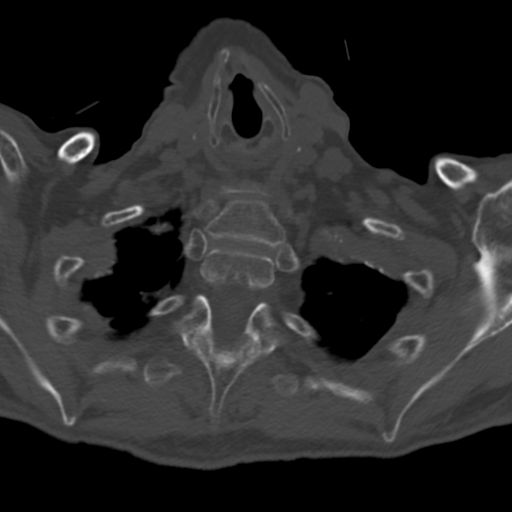
[im 30/90  bone]
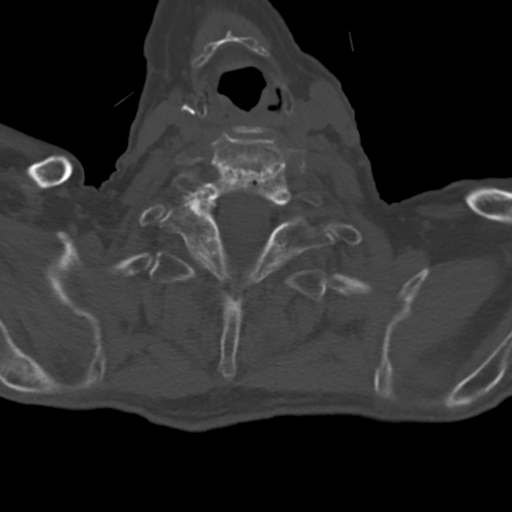
[im 50/90  bone]
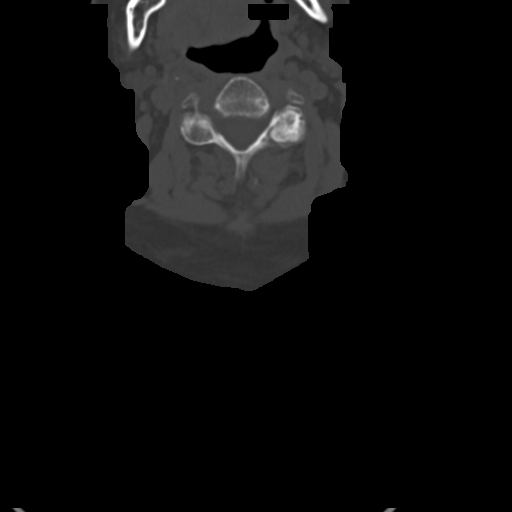
[im 60/90  soft-tissue]
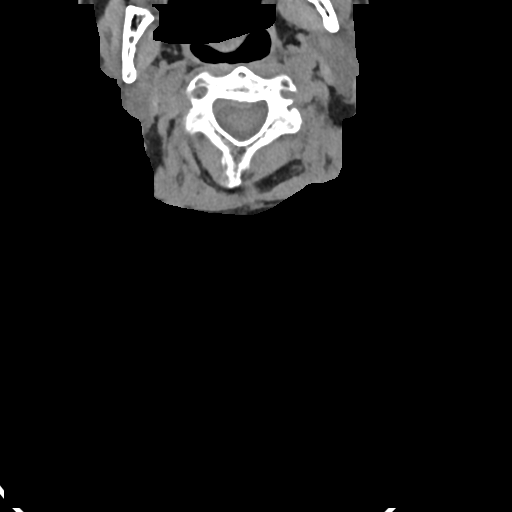
[im 60/90  bone]
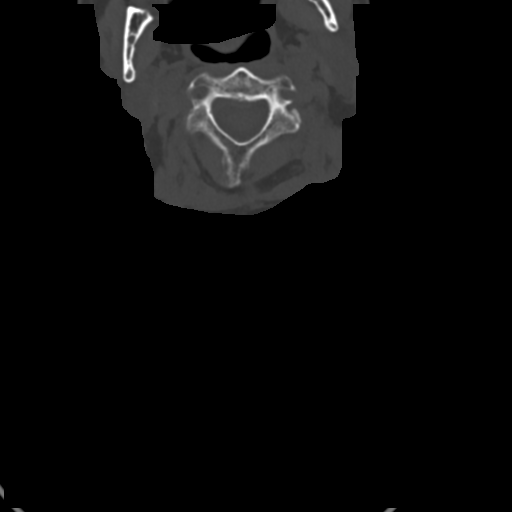
[im 70/90  bone]
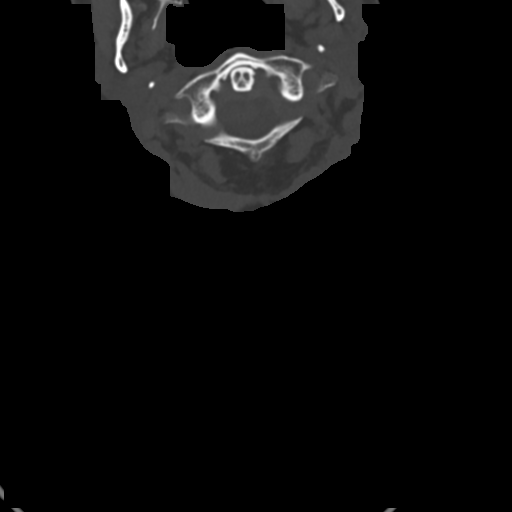
[im 80/90  bone]
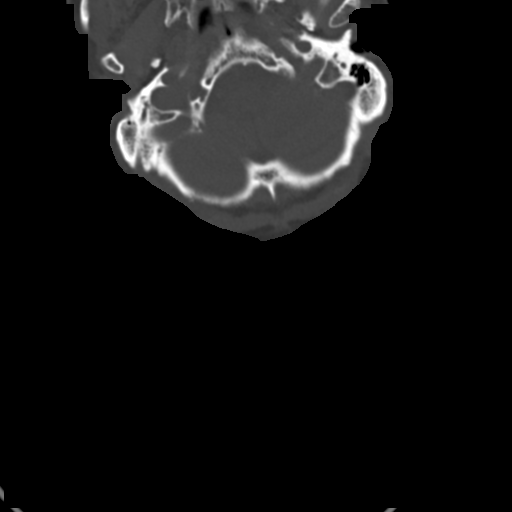

[Series 9: sagittal bone · sagittal · 0.26mm/px · 4 of 61 slices shown]
[im 13/61  bone]
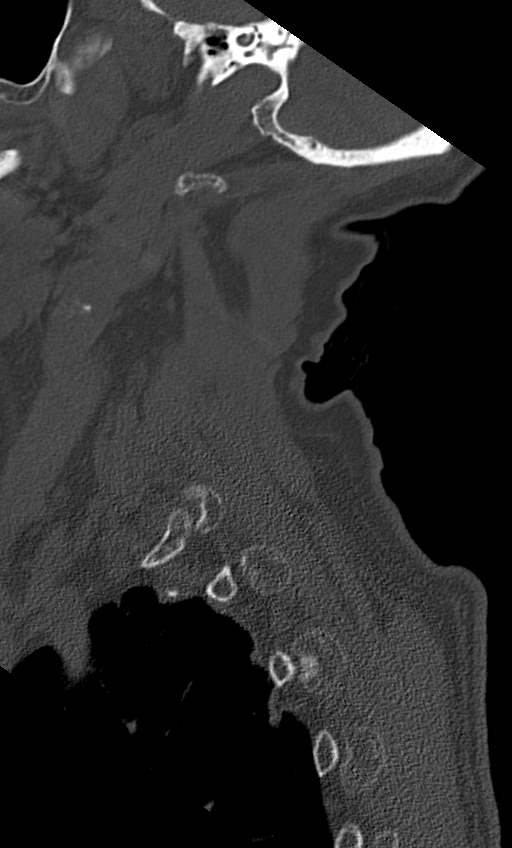
[im 25/61  bone]
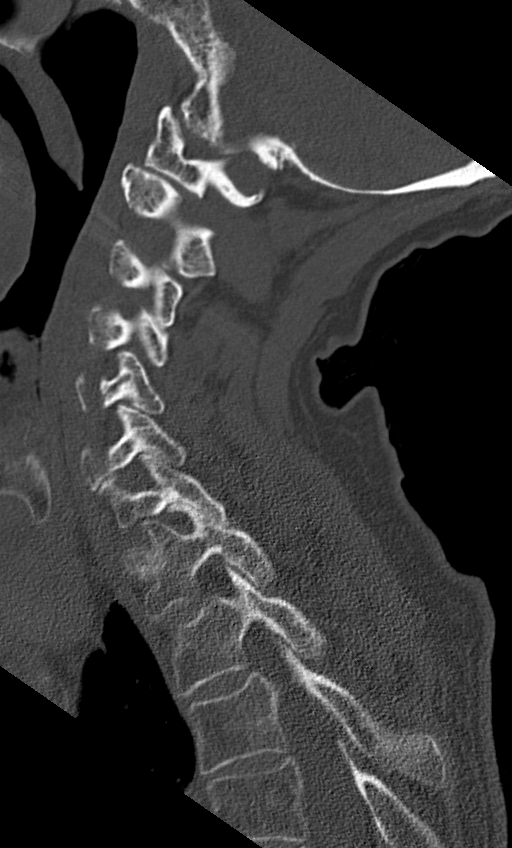
[im 37/61  bone]
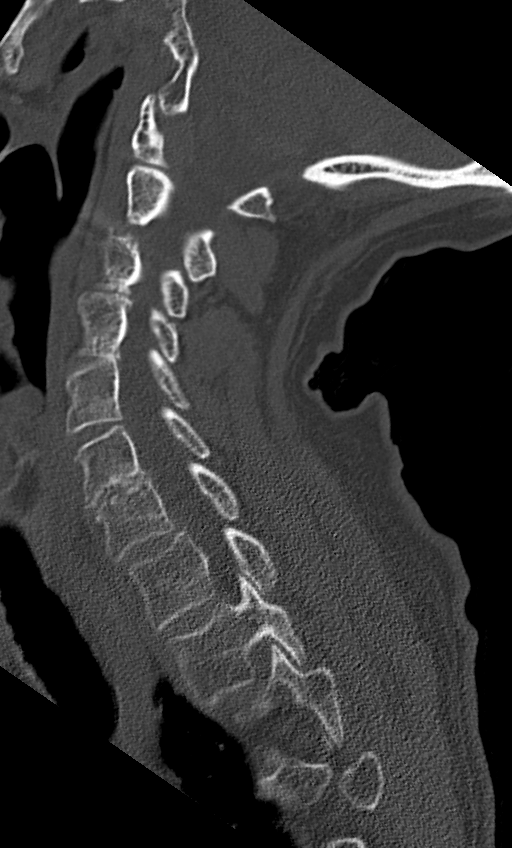
[im 49/61  bone]
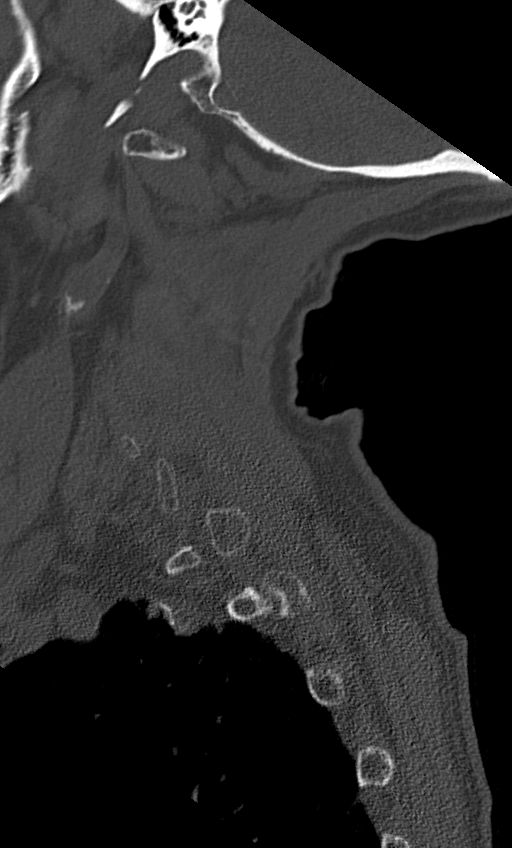

[14 of 33 positions shown; findings below may reference images not displayed]

FINDINGS: CT HEAD FINDINGS

Brain: No subdural, epidural, or subarachnoid hemorrhage. Ventricles
and sulci are prominent but stable. A tiny lacunar infarct in the
right corona radiata on series 3, image 17 is new but does not
appear acute. Other white matter changes are stable. No acute
cortical ischemia or infarct. Cerebellum, brainstem, and basal
cisterns are normal. No mass effect or midline shift.

Vascular: Calcified atherosclerosis in the intracranial carotids.

Skull: Craniotomy changes noted over the bilateral frontal bones. No
acute fractures or bony abnormalities.

Sinuses/Orbits: No acute finding.

Other: None.

CT CERVICAL SPINE FINDINGS

Alignment: There is 2 mm of anterolisthesis of C7 versus T1. No
other malalignment.

Skull base and vertebrae: No acute fracture. No primary bone lesion
or focal pathologic process.

Soft tissues and spinal canal: No prevertebral fluid or swelling. No
visible canal hematoma.

Disc levels:  Degenerative changes at C6-7 and in the facets.

Upper chest: There is a cluster nodules in the posterior right upper
lobe on series 8, image 88, likely infectious or inflammatory. No
evidence of malignancy in the upper chest on today's study.

Other: No other abnormalities.
IMPRESSION: 1. No acute intracranial abnormalities.
2. 2 mm of anterolisthesis of C7 versus T1 is thought to be
degenerative. No fractures or evidence of traumatic malalignment.

## 2019-05-19 ENCOUNTER — Telehealth: Payer: Self-pay | Admitting: *Deleted

## 2019-05-19 DIAGNOSIS — Z952 Presence of prosthetic heart valve: Secondary | ICD-10-CM

## 2019-05-19 DIAGNOSIS — I480 Paroxysmal atrial fibrillation: Secondary | ICD-10-CM

## 2019-05-19 NOTE — Telephone Encounter (Signed)
Patient's son aware of coumadin dosage

## 2019-05-19 NOTE — Telephone Encounter (Signed)
Continue coumadin as is °

## 2019-05-19 NOTE — Telephone Encounter (Signed)
Fax received mdINR PT/INR self testing service Test date/time 05/19/19 1:42 pm INR 2.0

## 2019-05-26 ENCOUNTER — Telehealth: Payer: Self-pay | Admitting: *Deleted

## 2019-05-26 DIAGNOSIS — I480 Paroxysmal atrial fibrillation: Secondary | ICD-10-CM

## 2019-05-26 DIAGNOSIS — Z952 Presence of prosthetic heart valve: Secondary | ICD-10-CM

## 2019-05-26 LAB — PROTIME-INR: INR: 1.4 — AB (ref <=1.1)

## 2019-05-26 NOTE — Telephone Encounter (Signed)
Fax received mdINR PT/INR self testing service Test date/time 05/26/19 12:10 pm INR 1.4

## 2019-05-26 NOTE — Patient Instructions (Signed)
Description   3 mg daily except 2 mg on Thursday

## 2019-05-27 ENCOUNTER — Telehealth: Payer: Self-pay | Admitting: *Deleted

## 2019-05-27 DIAGNOSIS — Z952 Presence of prosthetic heart valve: Secondary | ICD-10-CM

## 2019-05-27 DIAGNOSIS — I480 Paroxysmal atrial fibrillation: Secondary | ICD-10-CM

## 2019-05-27 NOTE — Telephone Encounter (Signed)
Yes, thanks. WS

## 2019-05-27 NOTE — Telephone Encounter (Signed)
Aware - son

## 2019-05-27 NOTE — Telephone Encounter (Signed)
Please contact the patient his - INR is still low. Make the change we suggested yesterday, please.

## 2019-05-27 NOTE — Telephone Encounter (Signed)
[name] aware  

## 2019-05-27 NOTE — Telephone Encounter (Signed)
Fax received mdINR PT/INR self testing service Test date/time 05/27/19 10:21 am INR 1.4

## 2019-05-27 NOTE — Telephone Encounter (Signed)
After speaking with [name], son -- we found out that he had missed 2 whole doses of coumadin (caregiver error)  Currently now back on: 3 yesterday, 3 today and then [name] was going to go back to alt days - 3 one day and then 2 the next.  Is this ok??

## 2019-06-02 ENCOUNTER — Telehealth: Payer: Self-pay | Admitting: *Deleted

## 2019-06-02 ENCOUNTER — Other Ambulatory Visit: Payer: Self-pay | Admitting: Family Medicine

## 2019-06-02 DIAGNOSIS — Z952 Presence of prosthetic heart valve: Secondary | ICD-10-CM

## 2019-06-02 DIAGNOSIS — I480 Paroxysmal atrial fibrillation: Secondary | ICD-10-CM

## 2019-06-02 NOTE — Telephone Encounter (Signed)
Indication: Afib Goal INR: 2-3 Current regimen: Recently took an extra day of 3mg .  He alternates w/ 3 and 2mg  (had missed a few doses last week and is trying to catch up)  Recommendations:  I spoke to patient's son on the phone and I would like him to also replace one of the 2 mg days with 3 mg of Coumadin.  He can continue alternating after that dose.  His son will replace his Wednesday dose with a 3 mg tablet and they will recheck INR in 1 week.

## 2019-06-02 NOTE — Telephone Encounter (Signed)
Fax received mdINR PT/INR self testing service Test date/time 06/02/19 12:53 am INR 1.8

## 2019-06-09 ENCOUNTER — Telehealth: Payer: Self-pay | Admitting: *Deleted

## 2019-06-09 DIAGNOSIS — Z952 Presence of prosthetic heart valve: Secondary | ICD-10-CM

## 2019-06-09 DIAGNOSIS — I4821 Permanent atrial fibrillation: Secondary | ICD-10-CM | POA: Diagnosis not present

## 2019-06-09 DIAGNOSIS — Z7901 Long term (current) use of anticoagulants: Secondary | ICD-10-CM | POA: Diagnosis not present

## 2019-06-09 DIAGNOSIS — I480 Paroxysmal atrial fibrillation: Secondary | ICD-10-CM

## 2019-06-09 NOTE — Telephone Encounter (Signed)
INR 1.9 today  Increase coumadin to 5mg  on mondays ( 2mg  and 3mg  tablet) and 3mg  all other days. Please recheck in 1 week

## 2019-06-09 NOTE — Telephone Encounter (Signed)
Son [name] aware 

## 2019-06-09 NOTE — Telephone Encounter (Signed)
Fax received mdINR PT/INR self testing service Test date/time 06/09/19 1:02 pm INR 1.9

## 2019-06-10 ENCOUNTER — Telehealth: Payer: Self-pay | Admitting: Family Medicine

## 2019-06-10 NOTE — Chronic Care Management (AMB) (Signed)
Chronic Care Management   Note  06/10/2019 Name: [name] MRN: [id number] DOB: Sep 22, 1928  [name] is a 85 y.o. year old male who is a primary care patient of Stacks, Cletus Gash, MD. I reached out to Raynelle Chary by phone today in response to a referral sent by [name]'s health plan.    [name] was given information about Chronic Care Management services today including:  1. CCM service includes personalized support from designated clinical staff supervised by his physician, including individualized plan of care and coordination with other care providers 2. 24/7 contact phone numbers for assistance for urgent and routine care needs. 3. Service will only be billed when office clinical staff spend 20 minutes or more in a month to coordinate care. 4. Only one practitioner may furnish and bill the service in a calendar month. 5. The patient may stop CCM services at any time (effective at the end of the month) by phone call to the office staff. 6. The patient will be responsible for cost sharing (co-pay) of up to 20% of the service fee (after annual deductible is met).  Patients [name] did not agree to enrollment in care management services and does not wish to consider at this time.  Follow up plan: The patient has been provided with contact information for the chronic care management team and has been advised to call with any health related questions or concerns.   [place]  [email or web address]   ??[phone]

## 2019-06-10 NOTE — Chronic Care Management (AMB) (Signed)
°  Chronic Care Management   Outreach Note  06/10/2019 Name: [name] MRN: [id number] DOB: Apr 09, 1928  Referred by: Claretta Fraise, MD Reason for referral : Chronic Care Management (Initial CCM outreach was unsuccessful. )   An unsuccessful telephone outreach was attempted today. The patient was referred to the case management team by for assistance with chronic care management and care coordination.   Follow Up Plan: The care management team will reach out to the patient again over the next 7 days.   [place]  [email or web address]   ??[phone]

## 2019-06-16 ENCOUNTER — Telehealth: Payer: Self-pay | Admitting: *Deleted

## 2019-06-16 DIAGNOSIS — I480 Paroxysmal atrial fibrillation: Secondary | ICD-10-CM

## 2019-06-16 DIAGNOSIS — Z952 Presence of prosthetic heart valve: Secondary | ICD-10-CM

## 2019-06-16 NOTE — Telephone Encounter (Signed)
Son [name] aware 

## 2019-06-16 NOTE — Telephone Encounter (Signed)
Fax received mdINR PT/INR self testing service Test date/time 06/16/19 12:29 pm INR 2.0

## 2019-06-16 NOTE — Telephone Encounter (Signed)
Continue coumadin as is °

## 2019-06-23 ENCOUNTER — Telehealth: Payer: Self-pay | Admitting: *Deleted

## 2019-06-23 DIAGNOSIS — Z952 Presence of prosthetic heart valve: Secondary | ICD-10-CM

## 2019-06-23 DIAGNOSIS — I480 Paroxysmal atrial fibrillation: Secondary | ICD-10-CM

## 2019-06-23 NOTE — Telephone Encounter (Signed)
Fax received mdINR PT/INR self testing service Test date/time 06/23/19 12:19 pm INR 2.1

## 2019-06-23 NOTE — Telephone Encounter (Signed)
Continue coumadin as is °

## 2019-06-23 NOTE — Telephone Encounter (Signed)
Son aware.

## 2019-07-03 ENCOUNTER — Telehealth: Payer: Self-pay | Admitting: *Deleted

## 2019-07-03 DIAGNOSIS — I480 Paroxysmal atrial fibrillation: Secondary | ICD-10-CM

## 2019-07-03 DIAGNOSIS — Z952 Presence of prosthetic heart valve: Secondary | ICD-10-CM

## 2019-07-03 NOTE — Telephone Encounter (Signed)
Fax received mdINR PT/INR self testing service Test date/time 06/30/19 12:23 pm INR 2.2

## 2019-07-03 NOTE — Telephone Encounter (Signed)
Son [name] aware and verbalizes understanding.  

## 2019-07-03 NOTE — Telephone Encounter (Signed)
Description   INR 2.2 today  Continue Coumadin 5mg  on mondays ( 2mg  and 3mg  tablet) and 3mg  all other days. Please recheck in 2-3 week       Caryl Pina, MD [place] 07/03/2019, 4:43 PM

## 2019-07-04 ENCOUNTER — Telehealth: Payer: Self-pay | Admitting: *Deleted

## 2019-07-04 MED ORDER — WARFARIN SODIUM 2 MG PO TABS: ORAL_TABLET | ORAL | 1 refills | Status: DC

## 2019-07-04 MED ORDER — WARFARIN SODIUM 3 MG PO TABS: ORAL_TABLET | ORAL | 1 refills | Status: DC

## 2019-07-04 NOTE — Telephone Encounter (Signed)
I will update with his next INR

## 2019-07-04 NOTE — Telephone Encounter (Signed)
TC w/ son [name] When pt missed doses of Coumadin couple weeks back & his INR dropped to 1.4 son did not do the 5 mg on Monday &  3 mgs on all other days Pt is currently doing 3 mgs on 4 days of the week & 2 mgs on 3 days of the week. Will be checking INR next week, can anti-coag calender be updated

## 2019-07-07 ENCOUNTER — Telehealth: Payer: Self-pay | Admitting: *Deleted

## 2019-07-07 DIAGNOSIS — I4821 Permanent atrial fibrillation: Secondary | ICD-10-CM | POA: Diagnosis not present

## 2019-07-07 DIAGNOSIS — Z7901 Long term (current) use of anticoagulants: Secondary | ICD-10-CM | POA: Diagnosis not present

## 2019-07-07 DIAGNOSIS — Z952 Presence of prosthetic heart valve: Secondary | ICD-10-CM

## 2019-07-07 DIAGNOSIS — I480 Paroxysmal atrial fibrillation: Secondary | ICD-10-CM

## 2019-07-07 NOTE — Telephone Encounter (Signed)
Continue coumadin as is °

## 2019-07-07 NOTE — Telephone Encounter (Signed)
LMOVM continue current coumadin dose

## 2019-07-07 NOTE — Telephone Encounter (Signed)
Fax received mdINR PT/INR self testing service Test date/time 07/07/19 12:01 pm INR 2.2

## 2019-07-14 ENCOUNTER — Telehealth: Payer: Self-pay | Admitting: *Deleted

## 2019-07-14 DIAGNOSIS — I480 Paroxysmal atrial fibrillation: Secondary | ICD-10-CM

## 2019-07-14 DIAGNOSIS — Z952 Presence of prosthetic heart valve: Secondary | ICD-10-CM

## 2019-07-14 NOTE — Telephone Encounter (Signed)
Fax received mdINR PT/INR self testing service Test date/time 07/14/19 2:18 pm INR 1.9

## 2019-07-14 NOTE — Telephone Encounter (Signed)
Continue coumadin as is °

## 2019-07-15 NOTE — Telephone Encounter (Signed)
Aware to continue coumadin as is °

## 2019-07-22 ENCOUNTER — Telehealth: Payer: Self-pay | Admitting: *Deleted

## 2019-07-22 DIAGNOSIS — I480 Paroxysmal atrial fibrillation: Secondary | ICD-10-CM

## 2019-07-22 DIAGNOSIS — Z952 Presence of prosthetic heart valve: Secondary | ICD-10-CM

## 2019-07-22 NOTE — Telephone Encounter (Signed)
Fax received mdINR PT/INR self testing service Test date/time 07/21/19 12:31 pm INR 1.7

## 2019-07-22 NOTE — Telephone Encounter (Signed)
Continue coumadin as is °

## 2019-07-22 NOTE — Telephone Encounter (Signed)
Son aware.

## 2019-07-28 ENCOUNTER — Other Ambulatory Visit: Payer: Self-pay

## 2019-07-28 ENCOUNTER — Telehealth: Payer: Self-pay | Admitting: *Deleted

## 2019-07-28 DIAGNOSIS — Z952 Presence of prosthetic heart valve: Secondary | ICD-10-CM

## 2019-07-28 DIAGNOSIS — I480 Paroxysmal atrial fibrillation: Secondary | ICD-10-CM

## 2019-07-28 NOTE — Telephone Encounter (Signed)
Discussed with pt. Son, caregiver, leave dose as is.

## 2019-07-28 NOTE — Telephone Encounter (Signed)
Fax received mdINR PT/INR self testing service Test date/time 07/28/19 12:22 pm INR 2.3

## 2019-07-29 ENCOUNTER — Ambulatory Visit: Payer: Medicare HMO

## 2019-08-04 ENCOUNTER — Telehealth: Payer: Self-pay | Admitting: *Deleted

## 2019-08-04 DIAGNOSIS — Z7901 Long term (current) use of anticoagulants: Secondary | ICD-10-CM | POA: Diagnosis not present

## 2019-08-04 DIAGNOSIS — Z952 Presence of prosthetic heart valve: Secondary | ICD-10-CM

## 2019-08-04 DIAGNOSIS — I480 Paroxysmal atrial fibrillation: Secondary | ICD-10-CM

## 2019-08-04 DIAGNOSIS — I4821 Permanent atrial fibrillation: Secondary | ICD-10-CM | POA: Diagnosis not present

## 2019-08-04 LAB — PROTIME-INR: INR: 1.5 — AB (ref 0.9–1.1)

## 2019-08-04 NOTE — Telephone Encounter (Signed)
Description   Take 5 mg on MWF & 3 mg on other days.

## 2019-08-04 NOTE — Telephone Encounter (Signed)
Son aware of coumadin dosage 

## 2019-08-04 NOTE — Telephone Encounter (Signed)
Fax received mdINR PT/INR self testing service Test date/time 08/04/19 12:30 pm INR 1.5

## 2019-08-07 ENCOUNTER — Telehealth: Payer: Self-pay | Admitting: Internal Medicine

## 2019-08-07 ENCOUNTER — Ambulatory Visit (INDEPENDENT_AMBULATORY_CARE_PROVIDER_SITE_OTHER): Payer: Medicare HMO

## 2019-08-07 DIAGNOSIS — Z23 Encounter for immunization: Secondary | ICD-10-CM

## 2019-08-07 NOTE — Telephone Encounter (Signed)
  Son is calling to say he will need to come in with his dad to his appt on 08/12/19 due to the patient having dementia and needing help to get around.

## 2019-08-08 NOTE — Telephone Encounter (Signed)
Pt's son okay to come in with him for his appt.

## 2019-08-11 ENCOUNTER — Telehealth: Payer: Self-pay | Admitting: Family Medicine

## 2019-08-11 DIAGNOSIS — I480 Paroxysmal atrial fibrillation: Secondary | ICD-10-CM

## 2019-08-11 DIAGNOSIS — Z952 Presence of prosthetic heart valve: Secondary | ICD-10-CM

## 2019-08-11 NOTE — Telephone Encounter (Signed)
Description   Take 2 mg on MWF & 3 mg on other days.      Pt. Sundowning per [name]'s history tonight. Took two hours to calm him 3 nights ago. Getting agitated and angry. Hostile. Son will consider if it is bad enough to start him on seroquel.

## 2019-08-11 NOTE — Telephone Encounter (Signed)
Fax received mdINR PT/INR self testing service Test date/time 08/11/2019 12:48 INR 3.6

## 2019-08-12 ENCOUNTER — Other Ambulatory Visit: Payer: Self-pay

## 2019-08-12 ENCOUNTER — Encounter: Payer: Self-pay | Admitting: Internal Medicine

## 2019-08-12 ENCOUNTER — Ambulatory Visit: Payer: Medicare HMO | Admitting: Internal Medicine

## 2019-08-12 VITALS — BP 120/72 | HR 75 | Ht 68.0 in | Wt 131.0 lb

## 2019-08-12 DIAGNOSIS — Z952 Presence of prosthetic heart valve: Secondary | ICD-10-CM | POA: Diagnosis not present

## 2019-08-12 DIAGNOSIS — I493 Ventricular premature depolarization: Secondary | ICD-10-CM

## 2019-08-12 DIAGNOSIS — I480 Paroxysmal atrial fibrillation: Secondary | ICD-10-CM

## 2019-08-12 DIAGNOSIS — Z950 Presence of cardiac pacemaker: Secondary | ICD-10-CM

## 2019-08-12 DIAGNOSIS — I10 Essential (primary) hypertension: Secondary | ICD-10-CM | POA: Diagnosis not present

## 2019-08-12 DIAGNOSIS — G9001 Carotid sinus syncope: Secondary | ICD-10-CM

## 2019-08-12 DIAGNOSIS — I495 Sick sinus syndrome: Secondary | ICD-10-CM

## 2019-08-12 NOTE — Patient Instructions (Signed)

## 2019-08-12 NOTE — Progress Notes (Signed)
Patient Care Team: Claretta Fraise, MD as PCP - General (Family Medicine) Clent Jacks, MD as Consulting Physician (Ophthalmology) Deboraha Sprang, MD as Consulting Physician (Cardiology)   HPI  [name] is a 85 y.o. male Seen in follow-up for carotid hypersensitivity and syncope. He is status post Biotronik CLS pacemaker implantation.  Also significant ventricular ectopy causing functional bradycardia suppressed with dronaderone.  He has a history of mechanical aortic valve; he takes  Coumadin  ASA was stopped . He has a history of a craniotomy following a subdural hematoma.    DATE TEST EF   11/17 Echo   55-65 %   1/19 Echo   35-40 %          LV dysfunction had also intervened it was not clear whether this is related to PVCs or ventricular pacing  The patient denies chest pain, shortness of breath, nocturnal dyspnea, orthopnea or peripheral edema.  There have been no palpitations, lightheadedness or syncope.   appetitie and weight stable.  Date Cr Hgb  1/18 1.36 11.7  1/19 1.39 11.0  6/19 1.30 11.2  2/20 1.1          Records and Results Reviewed   Past Medical History:  Diagnosis Date  . Aortic stenosis    St.Jude AVR in 1985; Echo 8/12: EF 50-55%, mild LVH, mech AV with mild AI, mean gradient 14 mmHg, mild to mod MR;  Echo 8/13: EF 60-65%, AVR ok with mean gradient 19 mmHg  . CAD (coronary artery disease)    Mild, nonobstructive at catheterization 5/01  . Carotid stenosis    Dopplers 8/13: RICA 34-74% and LICA 2-59% => followup 1 year  . Cataract   . CVA (cerebral infarction) 12/2010  . DVT (deep venous thrombosis) ([place])   . History of pulmonary embolism    post op DVT/PE;  s/p IVC filter  . History of rib fracture   . History of shingles   . Hyperlipidemia   . Hypothyroidism   . Pacemaker  Biotronik   . Personal history of subdural hematoma    Requiring craniotomy  . S/P cholecystectomy 2005  . S/P shoulder surgery    left  . Stroke  ([place]) 2012  . Syncope    + carotid sinus massage    Past Surgical History:  Procedure Laterality Date  . AORTIC VALVE REPLACEMENT  1985   St. Jude / mechanical  . BRAIN SURGERY    . CARDIAC CATHETERIZATION    . EYE SURGERY     bilateral cataract  . PERMANENT PACEMAKER INSERTION N/A 08/01/2012   Procedure: PERMANENT PACEMAKER INSERTION;  Surgeon: Deboraha Sprang, MD;  [facility] C[facility] LAB;  Service: Cardiovascular;  Laterality: N/A;  . VASCULAR SURGERY      Current Outpatient Medications  Medication Sig Dispense Refill  . brimonidine-timolol (COMBIGAN) 0.2-0.5 % ophthalmic solution Place 1 drop into the right eye 2 (two) times daily.    Marland Kitchen docusate sodium (COLACE) 100 MG capsule Take 100 mg by mouth daily.     . dorzolamide (TRUSOPT) 2 % ophthalmic solution Place 1 drop into the right eye 2 (two) times daily.    . furosemide (LASIX) 20 MG tablet Take 20 mg by mouth daily as needed for fluid or edema.    Marland Kitchen latanoprost (XALATAN) 0.005 % ophthalmic solution Place 1 drop into both eyes at bedtime.    Marland Kitchen levothyroxine (SYNTHROID, LEVOTHROID) 88 MCG tablet Take 1 tablet (88 mcg total) by  mouth daily. 90 tablet 8  . Multiple Vitamin (MULTIVITAMIN) tablet Take 1 tablet by mouth daily.      . pantoprazole (PROTONIX) 40 MG tablet TAKE 1 TABLET EVERY DAY 90 tablet 1  . potassium chloride (K-DUR,KLOR-CON) 10 MEQ tablet Take 10 mEq by mouth daily as needed.    . tamsulosin (FLOMAX) 0.4 MG CAPS capsule TAKE 1 CAPSULE (0.4 MG TOTAL) BY MOUTH DAILY AFTER BREAKFAST. 90 capsule 1  . warfarin (COUMADIN) 2 MG tablet take 1every other day alternating with 3 mg on opposite days 90 tablet 1  . warfarin (COUMADIN) 3 MG tablet take 1 every other day alternating with 2 mg on opposite days 90 tablet 1   No current facility-administered medications for this visit.     Allergies  Allergen Reactions  . Ivp Dye [Iodinated Diagnostic Agents] Swelling    Swelling   . Nabumetone Hives  . Codeine Nausea Only     Nausea   . Sulfonamide Derivatives Hives and Itching    Hives itching       Review of Systems negative except from HPI and PMH  Physical Exam BP 120/72   Pulse 75   Ht 5\' 8"  (1.727 m)   Wt 131 lb (59.4 kg)   SpO2 98%   BMI 19.92 kg/m   Well developed and well nourished in no acute distress HENT normal Neck supple with JVP-flat Clear Device pocket well healed; without hematoma or erythema.  There is no tethering  Regular rate and rhythm, no gallop 2/6 murmur mechanical S2 Abd-soft with active BS No Clubbing cyanosis   edema Skin-warm and dry A & Oriented  Grossly normal sensory and motor function  ECG  Sinus 75 21/08/37 STT changes 2,3,f V3-6   Assessment and  Plan  Carotid sinus hypersensitivity   Sinus node dysfunction/chronotropic incompetence  Pacemaker-Biotronik  Hypertension  Heart failure chronic  PVCs/VT NS   Atrial fibrillation  Aortic valve replacement-mechanical  Abnormal ECG   Anorexia     SCAF noted  VT nonsustained present   Weight stable over the last year  On Anticoagulation;  No bleeding issues   ASA was stopped last year  Aware that it is recommended   Euvolemic continue current meds   Needs a CBC will have drawn by PCP per pt preference          Current medicines are reviewed at length with the patient today .  The patient does not  have concerns regarding medicines.

## 2019-08-18 ENCOUNTER — Telehealth: Payer: Self-pay | Admitting: *Deleted

## 2019-08-18 DIAGNOSIS — I480 Paroxysmal atrial fibrillation: Secondary | ICD-10-CM

## 2019-08-18 DIAGNOSIS — Z952 Presence of prosthetic heart valve: Secondary | ICD-10-CM

## 2019-08-18 NOTE — Telephone Encounter (Signed)
Called pt - last week INR was 3.6 and med changes made this week 2.1 - he is nervous that this continued dose will drop him again.  2 mg on MON, WED and FRI 3 mg on all other days.

## 2019-08-18 NOTE — Telephone Encounter (Signed)
Fax received mdINR PT/INR self testing service Test date/time 08/18/19 12:41 pm INR 2.1

## 2019-08-18 NOTE — Telephone Encounter (Signed)
Continue coumadin as is °

## 2019-08-18 NOTE — Telephone Encounter (Signed)
Patients son notified and verbalized understanding.

## 2019-08-18 NOTE — Telephone Encounter (Signed)
Please contact the patient. That is unlikely, but that is why we check weekly. For him I would muchprefer too ow to too high!

## 2019-08-25 ENCOUNTER — Telehealth: Payer: Self-pay | Admitting: *Deleted

## 2019-08-25 DIAGNOSIS — Z952 Presence of prosthetic heart valve: Secondary | ICD-10-CM

## 2019-08-25 DIAGNOSIS — I480 Paroxysmal atrial fibrillation: Secondary | ICD-10-CM

## 2019-08-25 NOTE — Telephone Encounter (Signed)
Son aware.

## 2019-08-25 NOTE — Telephone Encounter (Signed)
Continue coumadin as is °

## 2019-08-25 NOTE — Telephone Encounter (Signed)
Fax received mdINR PT/INR self testing service Test date/time 08/25/19 11:55 am INR 2.1

## 2019-09-01 ENCOUNTER — Telehealth: Payer: Self-pay | Admitting: *Deleted

## 2019-09-01 DIAGNOSIS — Z952 Presence of prosthetic heart valve: Secondary | ICD-10-CM

## 2019-09-01 DIAGNOSIS — Z7901 Long term (current) use of anticoagulants: Secondary | ICD-10-CM | POA: Diagnosis not present

## 2019-09-01 DIAGNOSIS — I4821 Permanent atrial fibrillation: Secondary | ICD-10-CM | POA: Diagnosis not present

## 2019-09-01 DIAGNOSIS — I480 Paroxysmal atrial fibrillation: Secondary | ICD-10-CM

## 2019-09-01 NOTE — Telephone Encounter (Signed)
Continue coumadin as is °

## 2019-09-01 NOTE — Telephone Encounter (Signed)
Aware. 

## 2019-09-01 NOTE — Telephone Encounter (Signed)
Fax received mdINR PT/INR self testing service Test date/time 09/01/19 12:19 am INR 2.1

## 2019-09-08 ENCOUNTER — Telehealth: Payer: Self-pay | Admitting: *Deleted

## 2019-09-08 DIAGNOSIS — I480 Paroxysmal atrial fibrillation: Secondary | ICD-10-CM

## 2019-09-08 DIAGNOSIS — Z952 Presence of prosthetic heart valve: Secondary | ICD-10-CM

## 2019-09-08 NOTE — Telephone Encounter (Signed)
Fax received mdINR PT/INR self testing service Test date/time 09/08/19 11:38 am INR 2.0

## 2019-09-08 NOTE — Telephone Encounter (Signed)
Continue coumadin as is °

## 2019-09-08 NOTE — Telephone Encounter (Signed)
Patents son notified and verbalized understanding

## 2019-09-15 ENCOUNTER — Telehealth: Payer: Self-pay | Admitting: *Deleted

## 2019-09-15 ENCOUNTER — Other Ambulatory Visit: Payer: Self-pay | Admitting: Family Medicine

## 2019-09-15 DIAGNOSIS — I480 Paroxysmal atrial fibrillation: Secondary | ICD-10-CM

## 2019-09-15 DIAGNOSIS — Z952 Presence of prosthetic heart valve: Secondary | ICD-10-CM

## 2019-09-15 DIAGNOSIS — N400 Enlarged prostate without lower urinary tract symptoms: Secondary | ICD-10-CM

## 2019-09-15 NOTE — Telephone Encounter (Signed)
Patient aware and verbalizes understanding. 

## 2019-09-15 NOTE — Telephone Encounter (Signed)
Fax received mdINR PT/INR self testing service Test date/time 08/15/19 12:07 pm INR 1.8

## 2019-09-15 NOTE — Telephone Encounter (Signed)
Continue coumadin as is °

## 2019-09-22 ENCOUNTER — Telehealth: Payer: Self-pay | Admitting: *Deleted

## 2019-09-22 DIAGNOSIS — I480 Paroxysmal atrial fibrillation: Secondary | ICD-10-CM

## 2019-09-22 DIAGNOSIS — Z952 Presence of prosthetic heart valve: Secondary | ICD-10-CM

## 2019-09-22 NOTE — Telephone Encounter (Signed)
Fax received mdINR PT/INR self testing service Test date/time 09/22/19 11:49 am INR 2.0

## 2019-09-22 NOTE — Telephone Encounter (Signed)
Continue coumadin as is °

## 2019-09-23 NOTE — Telephone Encounter (Signed)
[name] called and aware by detailed VM

## 2019-09-29 ENCOUNTER — Telehealth: Payer: Self-pay | Admitting: *Deleted

## 2019-09-29 DIAGNOSIS — I4821 Permanent atrial fibrillation: Secondary | ICD-10-CM | POA: Diagnosis not present

## 2019-09-29 DIAGNOSIS — Z952 Presence of prosthetic heart valve: Secondary | ICD-10-CM

## 2019-09-29 DIAGNOSIS — Z7901 Long term (current) use of anticoagulants: Secondary | ICD-10-CM | POA: Diagnosis not present

## 2019-09-29 DIAGNOSIS — I480 Paroxysmal atrial fibrillation: Secondary | ICD-10-CM

## 2019-09-29 NOTE — Telephone Encounter (Signed)
Fax received mdINR PT/INR self testing service Test date/time 09/29/19 12:24 pm INR 1.9

## 2019-09-29 NOTE — Telephone Encounter (Signed)
Continue coumadin as is °

## 2019-09-29 NOTE — Telephone Encounter (Signed)
Left detailed message on sons voicemail to continue current dose

## 2019-09-29 NOTE — Telephone Encounter (Signed)
Aware. 

## 2019-10-06 ENCOUNTER — Telehealth: Payer: Self-pay | Admitting: *Deleted

## 2019-10-06 ENCOUNTER — Ambulatory Visit: Payer: Self-pay | Admitting: Family Medicine

## 2019-10-06 DIAGNOSIS — Z952 Presence of prosthetic heart valve: Secondary | ICD-10-CM

## 2019-10-06 DIAGNOSIS — I480 Paroxysmal atrial fibrillation: Secondary | ICD-10-CM

## 2019-10-06 LAB — POCT INR: INR: 2.2 (ref 2.0–3.0)

## 2019-10-06 NOTE — Telephone Encounter (Signed)
Son aware and verbalizes understanding.  

## 2019-10-06 NOTE — Progress Notes (Signed)
INR 2.2 today.  Take 4 mg tomorrow and then return to normal: Take 2 mg on MWF & 3 mg on other days. Recheck INR 1 week.

## 2019-10-06 NOTE — Telephone Encounter (Signed)
INR 2.2 today.  Take 4 mg tomorrow and then return to normal: Take 2 mg on MWF & 3 mg on other days. Recheck 1 week.

## 2019-10-06 NOTE — Telephone Encounter (Signed)
Fax received mdINR PT/INR self testing service Test date/time 10/06/19 12:43pm INR 2.2

## 2019-10-06 NOTE — Telephone Encounter (Signed)
lmtcb

## 2019-10-13 ENCOUNTER — Telehealth: Payer: Self-pay | Admitting: *Deleted

## 2019-10-13 DIAGNOSIS — Z952 Presence of prosthetic heart valve: Secondary | ICD-10-CM

## 2019-10-13 DIAGNOSIS — I480 Paroxysmal atrial fibrillation: Secondary | ICD-10-CM

## 2019-10-13 NOTE — Telephone Encounter (Signed)
Fax received mdINR PT/INR self testing service Test date/time 10/13/19 10:15 am INR 2.0

## 2019-10-13 NOTE — Telephone Encounter (Signed)
Son aware.

## 2019-10-13 NOTE — Telephone Encounter (Signed)
Continue coumadin as is °

## 2019-10-20 ENCOUNTER — Telehealth: Payer: Self-pay | Admitting: *Deleted

## 2019-10-20 DIAGNOSIS — Z952 Presence of prosthetic heart valve: Secondary | ICD-10-CM

## 2019-10-20 DIAGNOSIS — I480 Paroxysmal atrial fibrillation: Secondary | ICD-10-CM

## 2019-10-20 NOTE — Telephone Encounter (Signed)
Patients son notified and verbalized understanding.

## 2019-10-20 NOTE — Telephone Encounter (Signed)
Continue coumadin as is °

## 2019-10-20 NOTE — Telephone Encounter (Signed)
Fax received mdINR PT/INR self testing service Test date/time 10/20/19 11:02 am INR 1.9

## 2019-10-27 ENCOUNTER — Telehealth: Payer: Self-pay | Admitting: *Deleted

## 2019-10-27 DIAGNOSIS — I480 Paroxysmal atrial fibrillation: Secondary | ICD-10-CM

## 2019-10-27 DIAGNOSIS — Z7901 Long term (current) use of anticoagulants: Secondary | ICD-10-CM | POA: Diagnosis not present

## 2019-10-27 DIAGNOSIS — I4821 Permanent atrial fibrillation: Secondary | ICD-10-CM | POA: Diagnosis not present

## 2019-10-27 DIAGNOSIS — Z952 Presence of prosthetic heart valve: Secondary | ICD-10-CM

## 2019-10-27 NOTE — Telephone Encounter (Signed)
Continue coumadin as is. Slightly high for him., but not enough to chnge dose. Maybe increase Greens and other Vit K foods this week

## 2019-10-27 NOTE — Telephone Encounter (Signed)
Fax received mdINR PT/INR self testing service Test date/time 10/27/19 11:13 am INR 2.4

## 2019-10-27 NOTE — Telephone Encounter (Signed)
Patient aware and verbalized understanding. °

## 2019-10-29 ENCOUNTER — Encounter: Payer: Self-pay | Admitting: Family Medicine

## 2019-10-29 ENCOUNTER — Ambulatory Visit (INDEPENDENT_AMBULATORY_CARE_PROVIDER_SITE_OTHER): Payer: Medicare HMO

## 2019-10-29 ENCOUNTER — Telehealth: Payer: Self-pay | Admitting: Family Medicine

## 2019-10-29 ENCOUNTER — Other Ambulatory Visit: Payer: Self-pay

## 2019-10-29 ENCOUNTER — Ambulatory Visit (INDEPENDENT_AMBULATORY_CARE_PROVIDER_SITE_OTHER): Payer: Medicare HMO | Admitting: Family Medicine

## 2019-10-29 VITALS — BP 117/69 | HR 74 | Temp 99.5°F | Resp 20 | Ht 68.0 in | Wt 132.0 lb

## 2019-10-29 DIAGNOSIS — R0781 Pleurodynia: Secondary | ICD-10-CM

## 2019-10-29 DIAGNOSIS — S0003XA Contusion of scalp, initial encounter: Secondary | ICD-10-CM

## 2019-10-29 DIAGNOSIS — F0150 Vascular dementia without behavioral disturbance: Secondary | ICD-10-CM | POA: Diagnosis not present

## 2019-10-29 DIAGNOSIS — R0789 Other chest pain: Secondary | ICD-10-CM

## 2019-10-29 DIAGNOSIS — S299XXA Unspecified injury of thorax, initial encounter: Secondary | ICD-10-CM | POA: Diagnosis not present

## 2019-10-29 NOTE — Telephone Encounter (Signed)
Apt scheduled.  

## 2019-10-29 NOTE — Progress Notes (Signed)
Subjective:  Patient ID: [name], male    DOB: [birth year]  Age: 85 y.o. MRN: [id number]  CC: Chest Pain Larey Seat Sunday )   HPI [name] presents for fall. Occurred three days ago. Son says he fell forward. Hit the right chest and forehead areas. No LOC. Stayed lucid. Two days later he developed focal swelling at the right chest. Hurts now to take a deep breath.   His son tells me the dementia is progressing. He can't remember the death of his wife. Doesn't remember peoples names frequently. Wants to go home when he is at home. Doesn't recognize his surroundings. HE is getting more unstable. Balance is poor - not related to recent fall, except as a cause of the fall - not a result of the fall.   Depression screen [facility] 2/9 10/29/2019 01/24/2019 11/18/2018  Decreased Interest 0 0 0  Down, Depressed, Hopeless 0 0 0  PHQ - 2 Score 0 0 0  Altered sleeping - - 0  Tired, decreased energy - - 0  Change in appetite - - 0  Feeling bad or failure about yourself  - - 0  Trouble concentrating - - 0  Moving slowly or fidgety/restless - - 0  Suicidal thoughts - - 0  PHQ-9 Score - - 0  Difficult doing work/chores - - Not difficult at all  Some recent data might be hidden    History [name] has a past medical history of Aortic stenosis, CAD (coronary artery disease), Carotid stenosis, Cataract, CVA (cerebral infarction) (12/2010), DVT (deep venous thrombosis) (HCC), History of pulmonary embolism, History of rib fracture, History of shingles, Hyperlipidemia, Hypothyroidism, Pacemaker  Biotronik, Personal history of subdural hematoma, S/P cholecystectomy (2005), S/P shoulder surgery, Stroke (HCC) (2012), and Syncope.   He has a past surgical history that includes Aortic valve replacement (1985); Cardiac catheterization; Vascular surgery; Brain surgery; permanent pacemaker insertion (N/A, 08/01/2012); and Eye surgery.   His family history includes Arthritis in his sister; Cancer in his  father; Coronary artery disease in an other family member; Heart attack in his brother, brother, and father; Heart disease in his brother and father; Stroke in his mother.He reports that he has never smoked. He has never used smokeless tobacco. He reports that he does not drink alcohol or use drugs.    ROS Review of Systems  Constitutional: Negative for fever.  Respiratory: Negative for shortness of breath.   Cardiovascular: Positive for chest pain (right chest wall).  Gastrointestinal: Negative for abdominal distention.  Musculoskeletal: Positive for arthralgias and back pain.  Skin: Negative for rash.  Neurological: Positive for dizziness and light-headedness. Negative for seizures and syncope.  Psychiatric/Behavioral: Positive for confusion and decreased concentration. Negative for agitation. The patient is nervous/anxious.     Objective:  BP 117/69   Pulse 74   Temp 99.5 F (37.5 C) (Temporal)   Resp 20   Ht 5\' 8"  (1.727 m)   Wt 132 lb (59.9 kg)   SpO2 99%   BMI 20.07 kg/m   BP Readings from Last 3 Encounters:  10/29/19 117/69  08/12/19 120/72  01/24/19 (!) 120/57    Wt Readings from Last 3 Encounters:  10/29/19 132 lb (59.9 kg)  08/12/19 131 lb (59.4 kg)  01/24/19 131 lb 2 Stanley (59.5 kg)     Physical Exam Constitutional:      General: He is not in acute distress.    Appearance: He is well-developed.  HENT:     Head: Normocephalic and  atraumatic.     Right Ear: External ear normal.     Left Ear: External ear normal.     Nose: Nose normal.  Eyes:     Conjunctiva/sclera: Conjunctivae normal.     Pupils: Pupils are equal, round, and reactive to light.  Cardiovascular:     Rate and Rhythm: Normal rate and regular rhythm.     Heart sounds: Murmur present. Systolic murmur present with a grade of 2/6.  Pulmonary:     Effort: Pulmonary effort is normal. No respiratory distress.     Breath sounds: Normal breath sounds. No wheezing or rales.  Abdominal:      Palpations: Abdomen is soft.     Tenderness: There is no abdominal tenderness.  Musculoskeletal:        General: Normal range of motion.     Cervical back: Normal range of motion and neck supple.     Comments: Right chest wall tender at anterior axillary to midclavicular line just above the level of the nipple.   Skin:    General: Skin is warm and dry.  Neurological:     Mental Status: He is alert and oriented to person, place, and time.     Deep Tendon Reflexes: Reflexes are normal and symmetric.  Psychiatric:        Behavior: Behavior normal.        Thought Content: Thought content normal.        Judgment: Judgment normal.    XR - no rib fx. Lungs inflated.   Assessment & Plan:   [name] was seen today for chest pain.  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  Allergies as of 10/29/2019      Reactions   Ivp Dye [iodinated Diagnostic Agents] Swelling   Swelling   Nabumetone Hives   Codeine Nausea Only   Nausea   Sulfonamide Derivatives Hives, Itching   Hives itching      Medication List       Accurate as of October 29, 2019  6:12 PM. If you have any questions, ask your nurse or doctor.        Combigan 0.2-0.5 % ophthalmic solution Generic drug: brimonidine-timolol Place 1 drop into the right eye 2 (two) times daily.   docusate sodium 100 MG capsule Commonly known as: COLACE Take 100 mg by mouth daily.   dorzolamide 2 % ophthalmic solution Commonly known as: TRUSOPT Place 1 drop into the right eye 2 (two) times daily.   furosemide 20 MG tablet Commonly  known as: LASIX Take 20 mg by mouth daily as needed for fluid or edema.   latanoprost 0.005 % ophthalmic solution Commonly known as: XALATAN Place 1 drop into both eyes at bedtime.   levothyroxine 88 MCG tablet Commonly known as: SYNTHROID TAKE 1 TABLET EVERY DAY   multivitamin tablet Take 1 tablet by mouth daily.   pantoprazole 40 MG tablet Commonly known as: PROTONIX TAKE 1 TABLET EVERY DAY   potassium chloride 10 MEQ tablet Commonly known as: KLOR-CON Take 10 mEq by mouth daily as needed.   tamsulosin 0.4 MG Caps capsule Commonly known as: FLOMAX TAKE 1 CAPSULE DAILY AFTER  BREAKFAST   warfarin 2 MG tablet Commonly known as: Coumadin Take as directed by the anticoagulation clinic. If you are unsure how to take this medication, talk to your nurse or doctor. Original instructions: take 1every other day alternating with 3 mg on opposite days   warfarin 3 MG tablet Commonly known as: Coumadin Take as directed by the anticoagulation clinic. If you are unsure how to take this medication, talk to your nurse or doctor. Original instructions: take 1 every other day alternating with 2 mg on opposite days        Follow-up: Return in about 1 week (around 11/05/2019), or if symptoms worsen or fail to improve.  Claretta Fraise, M.D.

## 2019-11-05 ENCOUNTER — Telehealth: Payer: Self-pay | Admitting: *Deleted

## 2019-11-05 DIAGNOSIS — Z952 Presence of prosthetic heart valve: Secondary | ICD-10-CM

## 2019-11-05 DIAGNOSIS — I480 Paroxysmal atrial fibrillation: Secondary | ICD-10-CM

## 2019-11-05 NOTE — Telephone Encounter (Signed)
Continue coumadin as is °

## 2019-11-05 NOTE — Telephone Encounter (Signed)
Fax received mdINR PT/INR self testing service Test date/time 11/03/19 10:21 am INR 2.3

## 2019-11-05 NOTE — Telephone Encounter (Signed)
Son aware.

## 2019-11-10 ENCOUNTER — Telehealth: Payer: Self-pay | Admitting: *Deleted

## 2019-11-10 ENCOUNTER — Ambulatory Visit: Payer: Self-pay | Admitting: Family Medicine

## 2019-11-10 DIAGNOSIS — Z952 Presence of prosthetic heart valve: Secondary | ICD-10-CM

## 2019-11-10 DIAGNOSIS — I480 Paroxysmal atrial fibrillation: Secondary | ICD-10-CM

## 2019-11-10 LAB — POCT INR: INR: 2.2 (ref 2.0–3.0)

## 2019-11-10 NOTE — Telephone Encounter (Signed)
INR 2.2 today.  Need to change dosing.  3 mg on Sunday, Thursday, and Saturday 4 mg on Tuesday 2 mo on Monday, Wednesday, and Friday  Recheck INR in one week.

## 2019-11-10 NOTE — Telephone Encounter (Signed)
Patient son  aware and verbalized understanding. States that they are going to keep him on the same dosing because that is what [employer] would do [employer]

## 2019-11-10 NOTE — Telephone Encounter (Signed)
His goal INR is 2.5-3.5 and these adjustments are warranted due to not being at goal. This adjustments should be made to prevent any adverse effects. I would suggest making these changes based on current guidelines.

## 2019-11-10 NOTE — Progress Notes (Signed)
INR 2.2 today.  Need to change dosing.  3 mg on Sunday, Thursday, and Saturday 4 mg on Tuesday 2 mo on Monday, Wednesday, and Friday  Recheck INR in one week.  

## 2019-11-10 NOTE — Telephone Encounter (Signed)
Fax received mdINR PT/INR self testing service Test date/time 11/10/19 12:00pm  INR 2.2

## 2019-11-10 NOTE — Telephone Encounter (Signed)
Pt's son says Dr Livia Snellen has told him not to change coumadin doses as his goal INR is 2-2.5.

## 2019-11-17 ENCOUNTER — Telehealth: Payer: Self-pay | Admitting: *Deleted

## 2019-11-17 DIAGNOSIS — Z952 Presence of prosthetic heart valve: Secondary | ICD-10-CM

## 2019-11-17 DIAGNOSIS — I480 Paroxysmal atrial fibrillation: Secondary | ICD-10-CM

## 2019-11-17 NOTE — Telephone Encounter (Signed)
Son aware and verbalizes understanding.  

## 2019-11-17 NOTE — Telephone Encounter (Signed)
Fax received mdINR PT/INR self testing service Test date/time 11/17/19 11:15 am INR 1.8

## 2019-11-17 NOTE — Telephone Encounter (Signed)
Continue coumadin as is °

## 2019-11-24 ENCOUNTER — Telehealth: Payer: Self-pay | Admitting: *Deleted

## 2019-11-24 DIAGNOSIS — I4821 Permanent atrial fibrillation: Secondary | ICD-10-CM | POA: Diagnosis not present

## 2019-11-24 DIAGNOSIS — Z7901 Long term (current) use of anticoagulants: Secondary | ICD-10-CM | POA: Diagnosis not present

## 2019-11-24 DIAGNOSIS — I480 Paroxysmal atrial fibrillation: Secondary | ICD-10-CM

## 2019-11-24 DIAGNOSIS — Z952 Presence of prosthetic heart valve: Secondary | ICD-10-CM

## 2019-11-24 NOTE — Telephone Encounter (Signed)
Fax received mdINR PT/INR self testing service Test date/time 11/24/19 11:27 am INR 2.2

## 2019-11-24 NOTE — Telephone Encounter (Signed)
Continue Coumadin as is

## 2019-12-01 ENCOUNTER — Ambulatory Visit: Payer: Self-pay | Admitting: Family Medicine

## 2019-12-01 ENCOUNTER — Telehealth: Payer: Self-pay | Admitting: *Deleted

## 2019-12-01 DIAGNOSIS — Z952 Presence of prosthetic heart valve: Secondary | ICD-10-CM

## 2019-12-01 DIAGNOSIS — I480 Paroxysmal atrial fibrillation: Secondary | ICD-10-CM

## 2019-12-01 LAB — POCT INR: INR: 2.3 (ref 2.0–3.0)

## 2019-12-01 NOTE — Telephone Encounter (Signed)
Fax received mdINR PT/INR self testing service Test date/time 12/01/19 10:39 am INR 2.3

## 2019-12-01 NOTE — Telephone Encounter (Signed)
INR 2.3 today.  Need to change dosing.  3 mg on Sunday, Monday, Thursday, and Saturday 4 mg on Tuesday 2 mg on Wednesday, and Friday  Recheck INR in one week.   

## 2019-12-01 NOTE — Telephone Encounter (Signed)
The goal in the chart is listed at 2.5-3.5. That was the reason for the change. If he has a new goal, this needs to be adjusted in his chart.   Marcelino Duster

## 2019-12-01 NOTE — Telephone Encounter (Signed)
Spoke to pt's son and he says Dr Darlyn Read has told him his goal INR is between 2-2.5 and he shouldn't change dose as long as it stays within that goal so he isn't changing the dose.

## 2019-12-01 NOTE — Progress Notes (Signed)
INR 2.3 today.  Need to change dosing.  3 mg on Sunday, Monday, Thursday, and Saturday 4 mg on Tuesday 2 mg on Wednesday, and Friday  Recheck INR in one week.

## 2019-12-02 NOTE — Addendum Note (Signed)
Addended by: [name] on: 12/02/2019 05:13 PM   Modules accepted: Kipp Brood

## 2019-12-02 NOTE — Telephone Encounter (Signed)
I will look into this to see how we can change it. Thanks.

## 2019-12-02 NOTE — Telephone Encounter (Signed)
The goal is lower. I need to have someone to show me how to make the change in the system. It should be 1.8-2.5 based on history of bleed.

## 2019-12-08 ENCOUNTER — Telehealth: Payer: Self-pay | Admitting: *Deleted

## 2019-12-08 DIAGNOSIS — Z952 Presence of prosthetic heart valve: Secondary | ICD-10-CM

## 2019-12-08 DIAGNOSIS — I480 Paroxysmal atrial fibrillation: Secondary | ICD-10-CM

## 2019-12-08 NOTE — Telephone Encounter (Signed)
Son aware.

## 2019-12-08 NOTE — Telephone Encounter (Signed)
Please contact the patient INR is a little high. Eat more Vit K containing foods such as greens. Recheck in one week.

## 2019-12-08 NOTE — Telephone Encounter (Signed)
Fax received mdINR PT/INR self testing service Test date/time 12/08/19 11:23 am INR 2.5

## 2019-12-15 ENCOUNTER — Other Ambulatory Visit: Payer: Self-pay | Admitting: Family Medicine

## 2019-12-15 ENCOUNTER — Telehealth: Payer: Self-pay | Admitting: *Deleted

## 2019-12-15 DIAGNOSIS — Z952 Presence of prosthetic heart valve: Secondary | ICD-10-CM

## 2019-12-15 DIAGNOSIS — I480 Paroxysmal atrial fibrillation: Secondary | ICD-10-CM

## 2019-12-15 LAB — POCT INR: INR: 1.6 — AB (ref 2.0–3.0)

## 2019-12-15 NOTE — Telephone Encounter (Signed)
Fax received mdINR PT/INR self testing service Test date/time 12/15/19 11:49 am INR 1.6

## 2019-12-15 NOTE — Telephone Encounter (Signed)
Continue coumadin as is. Recheck in one week. 

## 2019-12-15 NOTE — Telephone Encounter (Signed)
Son aware.

## 2019-12-22 ENCOUNTER — Telehealth: Payer: Self-pay | Admitting: Family Medicine

## 2019-12-22 DIAGNOSIS — Z7901 Long term (current) use of anticoagulants: Secondary | ICD-10-CM | POA: Diagnosis not present

## 2019-12-22 DIAGNOSIS — I4821 Permanent atrial fibrillation: Secondary | ICD-10-CM | POA: Diagnosis not present

## 2019-12-22 NOTE — Telephone Encounter (Signed)
Fax received mdINR PT/INR self testing service Test date/time 12/22/19 12:28pm INR 1.9

## 2019-12-22 NOTE — Telephone Encounter (Signed)
Patient aware and verbalized understanding. °

## 2019-12-22 NOTE — Telephone Encounter (Signed)
Continue coumadin as is °

## 2019-12-29 ENCOUNTER — Telehealth: Payer: Self-pay | Admitting: Family Medicine

## 2019-12-29 NOTE — Telephone Encounter (Signed)
Continue coumadin as is °

## 2019-12-29 NOTE — Telephone Encounter (Signed)
Patient aware and verbalized understanding. °

## 2019-12-29 NOTE — Telephone Encounter (Signed)
Fax received mdINR PT/INR self testing service Test date/time 12/29/2019 11:14am  INR 2.0

## 2020-01-05 ENCOUNTER — Telehealth: Payer: Self-pay | Admitting: *Deleted

## 2020-01-05 DIAGNOSIS — Z952 Presence of prosthetic heart valve: Secondary | ICD-10-CM

## 2020-01-05 DIAGNOSIS — I480 Paroxysmal atrial fibrillation: Secondary | ICD-10-CM

## 2020-01-05 NOTE — Telephone Encounter (Signed)
Son aware and verbalizes understanding per dpr. 

## 2020-01-05 NOTE — Telephone Encounter (Signed)
Fax received mdINR PT/INR self testing service Test date/time 01/05/20 10:38 am INR 1.9

## 2020-01-05 NOTE — Telephone Encounter (Signed)
Continue coumadin as is °

## 2020-01-06 ENCOUNTER — Encounter: Payer: Self-pay | Admitting: Family Medicine

## 2020-01-06 ENCOUNTER — Ambulatory Visit (INDEPENDENT_AMBULATORY_CARE_PROVIDER_SITE_OTHER): Payer: Medicare HMO | Admitting: Family Medicine

## 2020-01-06 DIAGNOSIS — F0281 Dementia in other diseases classified elsewhere with behavioral disturbance: Secondary | ICD-10-CM

## 2020-01-06 DIAGNOSIS — F0150 Vascular dementia without behavioral disturbance: Secondary | ICD-10-CM | POA: Diagnosis not present

## 2020-01-06 DIAGNOSIS — G301 Alzheimer's disease with late onset: Secondary | ICD-10-CM | POA: Diagnosis not present

## 2020-01-06 DIAGNOSIS — F02818 Dementia in other diseases classified elsewhere, unspecified severity, with other behavioral disturbance: Secondary | ICD-10-CM

## 2020-01-06 MED ORDER — DONEPEZIL HCL 5 MG PO TABS: 5.0000 mg | ORAL_TABLET | Freq: Every day | ORAL | 0 refills | Status: DC

## 2020-01-06 NOTE — Progress Notes (Signed)
Subjective:    Patient ID: [name], male    DOB: [birth year], 86 y.o.   MRN: [id number]   HPI: [name] is a 86 y.o. male presenting for mental deterioration. Constantly asking where his deceased wife is. Conversations are repetative. Getting hostile. "No one will tell me anything." His [name] gives history. He would like medication for him. Confused. Having conversations with his sister in law that mistake her for his wife. He can't remember who he talked to two minutes after the conversation. Pt. Calling [name] 6-10 times a day to ask where his wife is. Family doesn't know what to do. Family has caregivers there 12 hours a day. Concerned that his money will run out soon. Pt. Has given out his medicare number to callers. Son had to take away his private numbers for SSN, insurance, etc.    Depression screen [facility] 2/9 10/29/2019 01/24/2019 11/18/2018 10/29/2018 10/15/2018  Decreased Interest 0 0 0 0 0  Down, Depressed, Hopeless 0 0 0 1 0  PHQ - 2 Score 0 0 0 1 0  Altered sleeping - - 0 - -  Tired, decreased energy - - 0 - -  Change in appetite - - 0 - -  Feeling bad or failure about yourself  - - 0 - -  Trouble concentrating - - 0 - -  Moving slowly or fidgety/restless - - 0 - -  Suicidal thoughts - - 0 - -  PHQ-9 Score - - 0 - -  Difficult doing work/chores - - Not difficult at all - -  Some recent data might be hidden     Relevant past medical, surgical, family and social history reviewed and updated as indicated.  Interim medical history since our last visit reviewed. Allergies and medications reviewed and updated.  ROS:  Review of Systems  Unable to perform ROS: Dementia     Social History   Tobacco Use  Smoking Status Never Smoker  Smokeless Tobacco Never Used       Objective:     Wt Readings from Last 3 Encounters:  10/29/19 132 lb (59.9 kg)  08/12/19 131 lb (59.4 kg)  01/24/19 131 lb 2 oz (59.5 kg)     Exam deferred. Pt. Harboring due to COVID  19. Phone visit performed.   Assessment & Plan:   1. Late onset Alzheimer's disease with behavioral disturbance ([place])   2. 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 donepezil (ARICEPT) 5 MG tablet    Sig: Take 1 tablet (5 mg total) by mouth at bedtime.    Dispense:  30 tablet    Refill:  0      Diagnoses and all orders for this visit:  Late onset Alzheimer's disease with behavioral disturbance (HCC) -     donepezil (ARICEPT) 5 MG tablet; Take 1 tablet (5 mg total) by mouth at bedtime.  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   Patient has a history of multi-infarct dementia.  He has been seen in the office for this many times.  He had some mild cognitive deficits.  He is never been overtly forgetful and disoriented before.  At this time based on today's interview with his son it is apparent that he has developed an Alzheimer's pattern of dementia at this time.  He  appears to be in stage II-III.  I suspected that stage I has been present and simply covered well by a very caring family along with decreased expectations that went along with the multi-infarct symptoms.  He clearly has had a decline over the last few weeks to months and is in need of treatment.  He does have multiple conditions that have been treated including anticoagulation for which he receives a low into therapeutic dose as a result of having had a brain bleed infarction in the past.  From discussion with his son his other conditions seem to be stable at this time.  We will be monitoring those as well soon.  I do want to see him back in the office in 1 month for follow-up and at that time may need to double check his thyroid to make sure that his dose is appropriate.  There is no history of acute onset or injury that would indicate risk for intracranial  bleeding at this time.  Virtual Visit via telephone Note  I discussed the limitations, risks, security and privacy concerns of performing an evaluation and management service by telephone and the availability of in person appointments. The patient was identified with two identifiers. Pt.expressed understanding and agreed to proceed. Pt. Is at home.  The history is given by his [name].  Dr. Darlyn Read is in his office.  Follow Up Instructions:   I discussed the assessment and treatment plan with the patient. The patient was provided an opportunity to ask questions and all were answered. The patient agreed with the plan and demonstrated an understanding of the instructions.   The patient was advised to call back or seek an in-person evaluation if the symptoms worsen or if the condition fails to improve as anticipated.   Total minutes including chart review and phone contact time: 34   Follow up plan: Return in about 1 month (around 02/03/2020).  [name], MD [place] [facility]

## 2020-01-12 ENCOUNTER — Telehealth: Payer: Self-pay | Admitting: *Deleted

## 2020-01-12 DIAGNOSIS — I480 Paroxysmal atrial fibrillation: Secondary | ICD-10-CM

## 2020-01-12 DIAGNOSIS — Z952 Presence of prosthetic heart valve: Secondary | ICD-10-CM

## 2020-01-12 NOTE — Telephone Encounter (Signed)
[name] aware and verbalizes understanding per dpr.

## 2020-01-12 NOTE — Telephone Encounter (Signed)
Fax received mdINR PT/INR self testing service Test date/time 01/12/20 12:03 pm INR 2.0

## 2020-01-12 NOTE — Telephone Encounter (Signed)
Continue coumadin as is °

## 2020-01-19 ENCOUNTER — Telehealth: Payer: Self-pay | Admitting: *Deleted

## 2020-01-19 DIAGNOSIS — I480 Paroxysmal atrial fibrillation: Secondary | ICD-10-CM

## 2020-01-19 DIAGNOSIS — Z7901 Long term (current) use of anticoagulants: Secondary | ICD-10-CM | POA: Diagnosis not present

## 2020-01-19 DIAGNOSIS — Z952 Presence of prosthetic heart valve: Secondary | ICD-10-CM

## 2020-01-19 DIAGNOSIS — I4821 Permanent atrial fibrillation: Secondary | ICD-10-CM | POA: Diagnosis not present

## 2020-01-19 NOTE — Telephone Encounter (Signed)
Fax received mdINR PT/INR self testing service Test date/time 01/19/20 12:16 pm INR 2.2

## 2020-01-19 NOTE — Telephone Encounter (Signed)
Continue coumadin as is °

## 2020-01-19 NOTE — Telephone Encounter (Signed)
Son [name] aware to continue current dose

## 2020-01-26 ENCOUNTER — Other Ambulatory Visit: Payer: Self-pay | Admitting: Family Medicine

## 2020-01-26 ENCOUNTER — Telehealth: Payer: Self-pay | Admitting: *Deleted

## 2020-01-26 DIAGNOSIS — I480 Paroxysmal atrial fibrillation: Secondary | ICD-10-CM

## 2020-01-26 DIAGNOSIS — F0281 Dementia in other diseases classified elsewhere with behavioral disturbance: Secondary | ICD-10-CM

## 2020-01-26 DIAGNOSIS — F0150 Vascular dementia without behavioral disturbance: Secondary | ICD-10-CM

## 2020-01-26 DIAGNOSIS — G301 Alzheimer's disease with late onset: Secondary | ICD-10-CM

## 2020-01-26 DIAGNOSIS — Z952 Presence of prosthetic heart valve: Secondary | ICD-10-CM

## 2020-01-26 NOTE — Telephone Encounter (Signed)
Fax received mdINR PT/INR self testing service Test date/time 01/26/20 11:22 am INR 2.2

## 2020-01-26 NOTE — Telephone Encounter (Signed)
Son aware.

## 2020-01-26 NOTE — Telephone Encounter (Signed)
Continue:  3 mg on Sunday, Monday, Thursday, and Saturday 4 mg on Tuesday 2 mg on Wednesday, and Friday  Recheck INR in 2 weeks

## 2020-01-30 ENCOUNTER — Encounter: Payer: Self-pay | Admitting: Family Medicine

## 2020-01-30 ENCOUNTER — Ambulatory Visit (INDEPENDENT_AMBULATORY_CARE_PROVIDER_SITE_OTHER): Payer: Medicare HMO | Admitting: Family Medicine

## 2020-01-30 DIAGNOSIS — G301 Alzheimer's disease with late onset: Secondary | ICD-10-CM | POA: Diagnosis not present

## 2020-01-30 DIAGNOSIS — F0281 Dementia in other diseases classified elsewhere with behavioral disturbance: Secondary | ICD-10-CM | POA: Diagnosis not present

## 2020-01-30 DIAGNOSIS — F0150 Vascular dementia without behavioral disturbance: Secondary | ICD-10-CM

## 2020-01-30 MED ORDER — DONEPEZIL HCL 10 MG PO TABS: 10.0000 mg | ORAL_TABLET | Freq: Every day | ORAL | 5 refills | Status: DC

## 2020-01-30 NOTE — Progress Notes (Signed)
Subjective:    Patient ID: [name], male    DOB: 03-Aug-1928, 86 y.o.   MRN: [id number]   HPI: [name] is a 86 y.o. male presenting for follow up of donepezil. A touch of change for the better. Some near normal conversation with his [name]. Has a good sense of humor. Minor dizziness and nausea at first have subsided.  INR earlier this week was 2.2     Depression screen [name] 2/9 10/29/2019 01/24/2019 11/18/2018 10/29/2018 10/15/2018  Decreased Interest 0 0 0 0 0  Down, Depressed, Hopeless 0 0 0 1 0  PHQ - 2 Score 0 0 0 1 0  Altered sleeping - - 0 - -  Tired, decreased energy - - 0 - -  Change in appetite - - 0 - -  Feeling bad or failure about yourself  - - 0 - -  Trouble concentrating - - 0 - -  Moving slowly or fidgety/restless - - 0 - -  Suicidal thoughts - - 0 - -  PHQ-9 Score - - 0 - -  Difficult doing work/chores - - Not difficult at all - -  Some recent data might be hidden     Relevant past medical, surgical, family and social history reviewed and updated as indicated.  Interim medical history since our last visit reviewed. Allergies and medications reviewed and updated.  ROS:  Review of Systems  Constitutional: Negative for fever.  Respiratory: Negative for shortness of breath.   Cardiovascular: Negative for chest pain.  Musculoskeletal: Negative for arthralgias.  Skin: Negative for rash.  Neurological: Positive for dizziness.  Psychiatric/Behavioral: Positive for confusion.     Social History   Tobacco Use  Smoking Status Never Smoker  Smokeless Tobacco Never Used       Objective:     Wt Readings from Last 3 Encounters:  10/29/19 132 lb (59.9 kg)  08/12/19 131 lb (59.4 kg)  01/24/19 131 lb 2 oz (59.5 kg)     Exam deferred. Pt. Harboring due to COVID 19. Phone visit performed.   Assessment & Plan:   1. 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 Late onset Alzheimer's disease with behavioral  disturbance ([place])     Meds ordered this encounter  Medications  . donepezil (ARICEPT) 10 MG tablet    Sig: Take 1 tablet (10 mg total) by mouth at bedtime.    Dispense:  30 tablet    Refill:  5    No orders of the defined types were placed in this encounter.     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  Late onset Alzheimer's disease with behavioral disturbance (HCC) -     donepezil (ARICEPT) 10 MG tablet; Take 1 tablet (10 mg total) by mouth at bedtime.    Virtual Visit via telephone Note  I discussed the limitations, risks, security and privacy concerns of performing an evaluation and management service by telephone and the availability of in person appointments. The patient was identified with two identifiers. Pt.expressed understanding and agreed to proceed. Pt. Is at home. Dr. Livia Snellen is in his office.  Follow Up Instructions:   I discussed the assessment and treatment plan with the patient. The patient was provided an opportunity to ask questions and all were answered. The patient agreed with the plan  and demonstrated an understanding of the instructions.   The patient was advised to call back or seek an in-person evaluation if the symptoms worsen or if the condition fails to improve as anticipated.   Total minutes including chart review and phone contact time: 12   Follow up plan: Return in about 6 weeks (around 03/12/2020).  Stanley Claude, MD [place] [facility]

## 2020-02-02 ENCOUNTER — Telehealth: Payer: Self-pay | Admitting: *Deleted

## 2020-02-02 DIAGNOSIS — I480 Paroxysmal atrial fibrillation: Secondary | ICD-10-CM

## 2020-02-02 DIAGNOSIS — Z952 Presence of prosthetic heart valve: Secondary | ICD-10-CM

## 2020-02-02 NOTE — Telephone Encounter (Signed)
Continue coumadin as is °

## 2020-02-02 NOTE — Telephone Encounter (Signed)
Pt informed and understood

## 2020-02-02 NOTE — Telephone Encounter (Signed)
Fax received mdINR PT/INR self testing service Test date/time 02/02/20 1:47 pm INR 2.3

## 2020-02-09 ENCOUNTER — Other Ambulatory Visit: Payer: Self-pay | Admitting: Family Medicine

## 2020-02-09 ENCOUNTER — Telehealth: Payer: Self-pay | Admitting: *Deleted

## 2020-02-09 DIAGNOSIS — I480 Paroxysmal atrial fibrillation: Secondary | ICD-10-CM

## 2020-02-09 DIAGNOSIS — Z952 Presence of prosthetic heart valve: Secondary | ICD-10-CM

## 2020-02-09 LAB — POCT INR: INR: 2.6 (ref 2.0–3.0)

## 2020-02-09 NOTE — Progress Notes (Signed)
INR 2.6 today.  Continue:  3 mg on Sunday, Monday, Thursday, and Saturday 4 mg on Tuesday 2 mg on Wednesday, and Friday  Recheck INR in 2 weeks

## 2020-02-09 NOTE — Telephone Encounter (Signed)
Fax received mdINR PT/INR self testing service Test date/time 02/09/20 10:11 am INR 2.6

## 2020-02-09 NOTE — Telephone Encounter (Signed)
INR 2.6 today.  Continue:  3 mg on Sunday, Monday, Thursday, and Saturday 4 mg on Tuesday 2 mg on Wednesday, and Friday  Recheck INR in 2 weeks 

## 2020-02-09 NOTE — Telephone Encounter (Signed)
Son aware of coumadin dosage 

## 2020-02-16 DIAGNOSIS — I4821 Permanent atrial fibrillation: Secondary | ICD-10-CM | POA: Diagnosis not present

## 2020-02-16 DIAGNOSIS — Z7901 Long term (current) use of anticoagulants: Secondary | ICD-10-CM | POA: Diagnosis not present

## 2020-02-17 ENCOUNTER — Telehealth: Payer: Self-pay | Admitting: *Deleted

## 2020-02-17 DIAGNOSIS — I480 Paroxysmal atrial fibrillation: Secondary | ICD-10-CM

## 2020-02-17 DIAGNOSIS — Z952 Presence of prosthetic heart valve: Secondary | ICD-10-CM

## 2020-02-17 NOTE — Telephone Encounter (Signed)
Patient son aware and verbalized understanding. °

## 2020-02-17 NOTE — Telephone Encounter (Signed)
Fax received mdINR PT/INR self testing service Test date/time 02/16/20 1049a  INR 2.4

## 2020-02-17 NOTE — Telephone Encounter (Signed)
Continue coumadin as is °

## 2020-02-21 DIAGNOSIS — H401113 Primary open-angle glaucoma, right eye, severe stage: Secondary | ICD-10-CM | POA: Diagnosis not present

## 2020-02-21 DIAGNOSIS — H401124 Primary open-angle glaucoma, left eye, indeterminate stage: Secondary | ICD-10-CM | POA: Diagnosis not present

## 2020-02-21 DIAGNOSIS — H20012 Primary iridocyclitis, left eye: Secondary | ICD-10-CM | POA: Diagnosis not present

## 2020-02-21 DIAGNOSIS — Z961 Presence of intraocular lens: Secondary | ICD-10-CM | POA: Diagnosis not present

## 2020-02-23 ENCOUNTER — Telehealth: Payer: Self-pay | Admitting: *Deleted

## 2020-02-23 DIAGNOSIS — I480 Paroxysmal atrial fibrillation: Secondary | ICD-10-CM

## 2020-02-23 DIAGNOSIS — H401113 Primary open-angle glaucoma, right eye, severe stage: Secondary | ICD-10-CM | POA: Diagnosis not present

## 2020-02-23 DIAGNOSIS — Z961 Presence of intraocular lens: Secondary | ICD-10-CM | POA: Diagnosis not present

## 2020-02-23 DIAGNOSIS — H20012 Primary iridocyclitis, left eye: Secondary | ICD-10-CM | POA: Diagnosis not present

## 2020-02-23 DIAGNOSIS — H401124 Primary open-angle glaucoma, left eye, indeterminate stage: Secondary | ICD-10-CM | POA: Diagnosis not present

## 2020-02-23 DIAGNOSIS — Z952 Presence of prosthetic heart valve: Secondary | ICD-10-CM

## 2020-02-23 NOTE — Telephone Encounter (Signed)
Fax received mdINR PT/INR self testing service Test date/time 02/23/20 950am INR 2.6

## 2020-02-23 NOTE — Telephone Encounter (Signed)
Getting a bit thin. Take 1/2 dose today and tomorrow. Recheck next week. WS

## 2020-02-23 NOTE — Telephone Encounter (Signed)
Patient aware and verbalizes understanding. 

## 2020-03-01 ENCOUNTER — Telehealth: Payer: Self-pay | Admitting: *Deleted

## 2020-03-01 DIAGNOSIS — Z961 Presence of intraocular lens: Secondary | ICD-10-CM | POA: Diagnosis not present

## 2020-03-01 DIAGNOSIS — H20012 Primary iridocyclitis, left eye: Secondary | ICD-10-CM | POA: Diagnosis not present

## 2020-03-01 DIAGNOSIS — I480 Paroxysmal atrial fibrillation: Secondary | ICD-10-CM

## 2020-03-01 DIAGNOSIS — H401113 Primary open-angle glaucoma, right eye, severe stage: Secondary | ICD-10-CM | POA: Diagnosis not present

## 2020-03-01 DIAGNOSIS — Z952 Presence of prosthetic heart valve: Secondary | ICD-10-CM

## 2020-03-01 DIAGNOSIS — H401124 Primary open-angle glaucoma, left eye, indeterminate stage: Secondary | ICD-10-CM | POA: Diagnosis not present

## 2020-03-01 NOTE — Telephone Encounter (Signed)
Fax received mdINR PT/INR self testing service Test date/time 03/01/20 1022am INR 2.0

## 2020-03-01 NOTE — Telephone Encounter (Signed)
Continue coumadin as is °

## 2020-03-01 NOTE — Telephone Encounter (Signed)
[name], pt's son made aware to continue Coumadin without changes.

## 2020-03-08 ENCOUNTER — Telehealth: Payer: Self-pay | Admitting: *Deleted

## 2020-03-08 DIAGNOSIS — I480 Paroxysmal atrial fibrillation: Secondary | ICD-10-CM

## 2020-03-08 DIAGNOSIS — Z952 Presence of prosthetic heart valve: Secondary | ICD-10-CM

## 2020-03-08 NOTE — Telephone Encounter (Signed)
Pt son, [name] aware

## 2020-03-08 NOTE — Telephone Encounter (Signed)
Continue coumadin as is °

## 2020-03-08 NOTE — Telephone Encounter (Signed)
Fax received mdINR PT/INR self testing service Test date/time 03/08/20 1139a INR 2.3

## 2020-03-12 ENCOUNTER — Other Ambulatory Visit: Payer: Self-pay | Admitting: Family Medicine

## 2020-03-12 DIAGNOSIS — N400 Enlarged prostate without lower urinary tract symptoms: Secondary | ICD-10-CM

## 2020-03-15 ENCOUNTER — Telehealth: Payer: Self-pay | Admitting: *Deleted

## 2020-03-15 DIAGNOSIS — Z7901 Long term (current) use of anticoagulants: Secondary | ICD-10-CM | POA: Diagnosis not present

## 2020-03-15 DIAGNOSIS — I4821 Permanent atrial fibrillation: Secondary | ICD-10-CM | POA: Diagnosis not present

## 2020-03-15 DIAGNOSIS — Z952 Presence of prosthetic heart valve: Secondary | ICD-10-CM

## 2020-03-15 DIAGNOSIS — I480 Paroxysmal atrial fibrillation: Secondary | ICD-10-CM

## 2020-03-15 NOTE — Telephone Encounter (Signed)
Fax received mdINR PT/INR self testing service Test date/time 03/15/20 1112 am INR 2.5

## 2020-03-15 NOTE — Telephone Encounter (Signed)
Informed to continue current dosage of Coumadin until rechecked.

## 2020-03-15 NOTE — Telephone Encounter (Signed)
Continue coumadin as is °

## 2020-03-16 ENCOUNTER — Ambulatory Visit (INDEPENDENT_AMBULATORY_CARE_PROVIDER_SITE_OTHER): Payer: Medicare HMO | Admitting: Family Medicine

## 2020-03-16 ENCOUNTER — Encounter: Payer: Self-pay | Admitting: Family Medicine

## 2020-03-16 DIAGNOSIS — J029 Acute pharyngitis, unspecified: Secondary | ICD-10-CM | POA: Diagnosis not present

## 2020-03-16 MED ORDER — AZITHROMYCIN 250 MG PO TABS: ORAL_TABLET | ORAL | 0 refills | Status: DC

## 2020-03-16 NOTE — Progress Notes (Signed)
Subjective:    Patient ID: [name], male    DOB: [birth year], 86 y.o.   MRN: [id number]   HPI: [name] is a 86 y.o. male presenting for 2 days of severe sore throat.  No fever chills or sweats.  Seems to be getting worse over the last day or so.  Appetite is poor anyway but somewhat diminished.  His son gives the history today and says he seems to be tolerating that Alzheimer's medication donepezil well, however he does seem to have some agitation from the return of some of his cognition.   Depression screen [facility] 2/9 10/29/2019 01/24/2019 11/18/2018 10/29/2018 10/15/2018  Decreased Interest 0 0 0 0 0  Down, Depressed, Hopeless 0 0 0 1 0  PHQ - 2 Score 0 0 0 1 0  Altered sleeping - - 0 - -  Tired, decreased energy - - 0 - -  Change in appetite - - 0 - -  Feeling bad or failure about yourself  - - 0 - -  Trouble concentrating - - 0 - -  Moving slowly or fidgety/restless - - 0 - -  Suicidal thoughts - - 0 - -  PHQ-9 Score - - 0 - -  Difficult doing work/chores - - Not difficult at all - -  Some recent data might be hidden     Relevant past medical, surgical, family and social history reviewed and updated as indicated.  Interim medical history since our last visit reviewed. Allergies and medications reviewed and updated.  ROS:  Review of Systems  Constitutional: Negative for activity change, appetite change, chills and fever.  HENT: Positive for hearing loss, sinus pressure, sore throat and trouble swallowing. Negative for congestion, ear pain, postnasal drip and rhinorrhea.   Respiratory: Negative for shortness of breath.   Skin: Negative for rash.     Social History   Tobacco Use  Smoking Status Never Smoker  Smokeless Tobacco Never Used       Objective:     Wt Readings from Last 3 Encounters:  10/29/19 132 lb (59.9 kg)  08/12/19 131 lb (59.4 kg)  01/24/19 131 lb 2 oz (59.5 kg)     Exam deferred. Pt. Harboring due to COVID 19. Phone visit  performed.   Assessment & Plan:   1. Pharyngitis, unspecified etiology     Meds ordered this encounter  Medications  . azithromycin (ZITHROMAX Z-PAK) 250 MG tablet    Sig: Take two right away Then one a day for the next 4 days.    Dispense:  6 each    Refill:  0    No orders of the defined types were placed in this encounter.     Diagnoses and all orders for this visit:  Pharyngitis, unspecified etiology  Other orders -     azithromycin (ZITHROMAX Z-PAK) 250 MG tablet; Take two right away Then one a day for the next 4 days.    Virtual Visit via telephone Note  I discussed the limitations, risks, security and privacy concerns of performing an evaluation and management service by telephone and the availability of in person appointments. The patient was identified with two identifiers. Pt.expressed understanding and agreed to proceed. Pt. Is at home. Dr. Darlyn Read is in his office.  Follow Up Instructions:   I discussed the assessment and treatment plan with the patient. The patient was provided an opportunity to ask questions and all were answered. The patient agreed with the plan and demonstrated  an understanding of the instructions.   The patient was advised to call back or seek an in-person evaluation if the symptoms worsen or if the condition fails to improve as anticipated.   Total minutes including chart review and phone contact time: 14   Follow up plan: Return if symptoms worsen or fail to improve.  Stanley Fraise, MD [place]

## 2020-03-18 ENCOUNTER — Telehealth: Payer: Self-pay | Admitting: Family Medicine

## 2020-03-18 NOTE — Telephone Encounter (Signed)
Called patient sons. States his father had the fever this morning after given tylenol it has went back down to 97. He was started on abx yesterday. They are to keep an eye on him and if fever comes back suggest to get COVID tested and let us know.

## 2020-03-19 ENCOUNTER — Telehealth: Payer: Self-pay | Admitting: *Deleted

## 2020-03-19 NOTE — Telephone Encounter (Signed)
Indication: afib Goal INR: 2-3  Recommendations:  Continue current regimen, repeat in 1 week

## 2020-03-19 NOTE — Telephone Encounter (Signed)
Patient aware and verbalized understanding. °

## 2020-03-19 NOTE — Telephone Encounter (Signed)
   Fax received mdINR PT/INR self testing service Test date/time 03/19/20 1048 INR 2.4

## 2020-03-22 ENCOUNTER — Telehealth: Payer: Self-pay | Admitting: *Deleted

## 2020-03-22 DIAGNOSIS — Z952 Presence of prosthetic heart valve: Secondary | ICD-10-CM

## 2020-03-22 DIAGNOSIS — I480 Paroxysmal atrial fibrillation: Secondary | ICD-10-CM

## 2020-03-22 NOTE — Telephone Encounter (Signed)
[name] aware of change to 3 mg daily

## 2020-03-22 NOTE — Telephone Encounter (Signed)
Fax received mdINR PT/INR self testing service Test date/time 03/22/20 1042 am INR 2.3

## 2020-03-22 NOTE — Telephone Encounter (Signed)
inr lightly low- change coumadin to 3mg  daiy and recheckk in 2 week

## 2020-03-24 DIAGNOSIS — H20012 Primary iridocyclitis, left eye: Secondary | ICD-10-CM | POA: Diagnosis not present

## 2020-03-24 DIAGNOSIS — Z961 Presence of intraocular lens: Secondary | ICD-10-CM | POA: Diagnosis not present

## 2020-03-24 DIAGNOSIS — H27111 Subluxation of lens, right eye: Secondary | ICD-10-CM | POA: Diagnosis not present

## 2020-03-24 DIAGNOSIS — H401113 Primary open-angle glaucoma, right eye, severe stage: Secondary | ICD-10-CM | POA: Diagnosis not present

## 2020-03-24 DIAGNOSIS — H401121 Primary open-angle glaucoma, left eye, mild stage: Secondary | ICD-10-CM | POA: Diagnosis not present

## 2020-03-29 ENCOUNTER — Telehealth: Payer: Self-pay | Admitting: *Deleted

## 2020-03-29 DIAGNOSIS — I480 Paroxysmal atrial fibrillation: Secondary | ICD-10-CM

## 2020-03-29 DIAGNOSIS — Z952 Presence of prosthetic heart valve: Secondary | ICD-10-CM

## 2020-03-29 NOTE — Telephone Encounter (Signed)
Fax received mdINR PT/INR self testing service Test date/time 03/29/20 1150 am INR 2.3

## 2020-03-29 NOTE — Telephone Encounter (Signed)
Continue coumadin as is °

## 2020-03-30 NOTE — Telephone Encounter (Signed)
Son [name] aware to continue current coumadin dose

## 2020-04-09 ENCOUNTER — Telehealth: Payer: Self-pay | Admitting: *Deleted

## 2020-04-12 DIAGNOSIS — I4821 Permanent atrial fibrillation: Secondary | ICD-10-CM | POA: Diagnosis not present

## 2020-04-12 DIAGNOSIS — Z7901 Long term (current) use of anticoagulants: Secondary | ICD-10-CM | POA: Diagnosis not present

## 2020-04-13 ENCOUNTER — Telehealth: Payer: Self-pay

## 2020-04-13 NOTE — Telephone Encounter (Signed)
Fax received mdINR PT/INR self testing service Test date/time 04/12/2020 10:24 am INR 2.6  INR range 2 - 3

## 2020-04-13 NOTE — Telephone Encounter (Signed)
Son aware and verbalized understanding  °

## 2020-04-13 NOTE — Telephone Encounter (Signed)
Hold the coumadin 2 days. Then resume usual dose.

## 2020-04-19 ENCOUNTER — Telehealth: Payer: Self-pay | Admitting: *Deleted

## 2020-04-19 DIAGNOSIS — Z952 Presence of prosthetic heart valve: Secondary | ICD-10-CM

## 2020-04-19 DIAGNOSIS — I480 Paroxysmal atrial fibrillation: Secondary | ICD-10-CM

## 2020-04-19 NOTE — Telephone Encounter (Signed)
Fax received mdINR PT/INR self testing service Test date/time 04/19/20 1113 am INR 2.0

## 2020-04-19 NOTE — Telephone Encounter (Signed)
Continue coumadin as is °

## 2020-04-19 NOTE — Telephone Encounter (Signed)
Son aware.

## 2020-04-20 ENCOUNTER — Encounter: Payer: Self-pay | Admitting: Nurse Practitioner

## 2020-04-20 ENCOUNTER — Other Ambulatory Visit: Payer: Self-pay

## 2020-04-20 ENCOUNTER — Ambulatory Visit (INDEPENDENT_AMBULATORY_CARE_PROVIDER_SITE_OTHER): Payer: Medicare HMO | Admitting: Nurse Practitioner

## 2020-04-20 VITALS — BP 117/64 | HR 69 | Temp 97.8°F | Resp 20 | Ht 68.0 in | Wt 130.0 lb

## 2020-04-20 DIAGNOSIS — R296 Repeated falls: Secondary | ICD-10-CM | POA: Diagnosis not present

## 2020-04-20 NOTE — Progress Notes (Signed)
   Subjective:    Patient ID: [name], male    DOB: [birth year], 86 y.o.   MRN: [id number]   Chief Complaint: Back Pain (Multiple falls)   HPI Pt's caregiver and son report frequent falls, ~2 x week during the time gap of care. Pt lives home alone with care giver during the day and family close by. Pt on anticoags, denies LOC or hitting head. C/o lower back pain. No visible abnormality. Pt A&O to baseline.     Review of Systems  Constitutional: Negative for diaphoresis.  Eyes: Negative for pain.  Respiratory: Negative for shortness of breath.   Cardiovascular: Negative for chest pain, palpitations and leg swelling.  Gastrointestinal: Negative for abdominal pain.  Endocrine: Negative for polydipsia.  Skin: Negative for rash.  Neurological: Negative for dizziness, weakness and headaches.  Hematological: Does not bruise/bleed easily.  All other systems reviewed and are negative.      Objective:   Physical Exam Constitutional:      Appearance: Normal appearance. He is normal weight.  HENT:     Head: Normocephalic.  Cardiovascular:     Rate and Rhythm: Normal rate and regular rhythm.  Musculoskeletal:        General: Normal range of motion.     Cervical back: Normal range of motion and neck supple.  Neurological:     Mental Status: He is alert.     BP 117/64   Pulse 69   Temp 97.8 F (36.6 C) (Temporal)   Resp 20   Ht 5\' 8"  (1.727 m)   Wt 130 lb (59 kg)   SpO2 97%   BMI 19.77 kg/m        Assessment & Plan:  in today with chief complaint of Back Pain (Multiple falls)   1. Frequent falls Do not ambulate without assistance or walker. Physical therapy will call you to set up an appointment to begin therapy at your home.   - Ambulatory referral to Home Health    The above assessment and management plan was discussed with the patient. The patient verbalized understanding of and has agreed to the management plan. Patient is aware to  call the clinic if symptoms persist or worsen. Patient is aware when to return to the clinic for a follow-up visit. Patient educated on when it is appropriate to go to the emergency department.   [name], FNP

## 2020-04-20 NOTE — Patient Instructions (Signed)

## 2020-04-22 ENCOUNTER — Other Ambulatory Visit: Payer: Self-pay | Admitting: Nurse Practitioner

## 2020-04-22 DIAGNOSIS — M159 Polyosteoarthritis, unspecified: Secondary | ICD-10-CM

## 2020-04-26 ENCOUNTER — Telehealth: Payer: Self-pay | Admitting: *Deleted

## 2020-04-26 DIAGNOSIS — I480 Paroxysmal atrial fibrillation: Secondary | ICD-10-CM

## 2020-04-26 DIAGNOSIS — Z952 Presence of prosthetic heart valve: Secondary | ICD-10-CM

## 2020-04-26 NOTE — Telephone Encounter (Signed)
Fax received mdINR PT/INR self testing service Test date/time 04/26/20 1149 am INR 1.8

## 2020-04-26 NOTE — Telephone Encounter (Signed)
Description   INR 1.8 today.  Goal 2.5-3.5  Change coumadin to 4 mg(2- 2 mg tablets) on Tuesdays and Fridays and 3mg  daily on all other days Recheck in 1 week       , MD [facility] 04/26/2020, 10:20 PM

## 2020-04-27 NOTE — Telephone Encounter (Signed)
Patient's son aware of dosing instructions.

## 2020-05-03 LAB — POCT INR: INR: 2 (ref 2.0–3.0)

## 2020-05-10 ENCOUNTER — Telehealth: Payer: Self-pay | Admitting: *Deleted

## 2020-05-10 DIAGNOSIS — I4821 Permanent atrial fibrillation: Secondary | ICD-10-CM | POA: Diagnosis not present

## 2020-05-10 DIAGNOSIS — Z7901 Long term (current) use of anticoagulants: Secondary | ICD-10-CM | POA: Diagnosis not present

## 2020-05-10 DIAGNOSIS — Z952 Presence of prosthetic heart valve: Secondary | ICD-10-CM

## 2020-05-10 DIAGNOSIS — I480 Paroxysmal atrial fibrillation: Secondary | ICD-10-CM

## 2020-05-10 LAB — POCT INR: INR: 2.3 (ref 2.0–3.0)

## 2020-05-10 NOTE — Telephone Encounter (Signed)
Fax received mdINR PT/INR self testing service Test date/time 05/03/20- 10:52AM INR 2.0

## 2020-05-10 NOTE — Telephone Encounter (Signed)
Continue coumadin as is °

## 2020-05-10 NOTE — Telephone Encounter (Signed)
Aware. 

## 2020-05-11 ENCOUNTER — Telehealth: Payer: Self-pay | Admitting: *Deleted

## 2020-05-11 DIAGNOSIS — I480 Paroxysmal atrial fibrillation: Secondary | ICD-10-CM

## 2020-05-11 DIAGNOSIS — Z952 Presence of prosthetic heart valve: Secondary | ICD-10-CM

## 2020-05-11 NOTE — Telephone Encounter (Signed)
Fax received mdINR PT/INR self testing service Test date/time 05/10/20 1:07PM INR 2.3

## 2020-05-11 NOTE — Telephone Encounter (Signed)
Continue coumadin as is °

## 2020-05-11 NOTE — Telephone Encounter (Signed)
Aware to continue same dose.

## 2020-05-13 DIAGNOSIS — F0390 Unspecified dementia without behavioral disturbance: Secondary | ICD-10-CM | POA: Diagnosis not present

## 2020-05-13 DIAGNOSIS — I472 Ventricular tachycardia: Secondary | ICD-10-CM | POA: Diagnosis not present

## 2020-05-13 DIAGNOSIS — M810 Age-related osteoporosis without current pathological fracture: Secondary | ICD-10-CM | POA: Diagnosis not present

## 2020-05-13 DIAGNOSIS — I4891 Unspecified atrial fibrillation: Secondary | ICD-10-CM | POA: Diagnosis not present

## 2020-05-13 DIAGNOSIS — R32 Unspecified urinary incontinence: Secondary | ICD-10-CM | POA: Diagnosis not present

## 2020-05-13 DIAGNOSIS — I2510 Atherosclerotic heart disease of native coronary artery without angina pectoris: Secondary | ICD-10-CM | POA: Diagnosis not present

## 2020-05-13 DIAGNOSIS — E785 Hyperlipidemia, unspecified: Secondary | ICD-10-CM | POA: Diagnosis not present

## 2020-05-13 DIAGNOSIS — H9193 Unspecified hearing loss, bilateral: Secondary | ICD-10-CM | POA: Diagnosis not present

## 2020-05-13 DIAGNOSIS — M150 Primary generalized (osteo)arthritis: Secondary | ICD-10-CM | POA: Diagnosis not present

## 2020-05-18 ENCOUNTER — Telehealth: Payer: Self-pay | Admitting: *Deleted

## 2020-05-18 DIAGNOSIS — I480 Paroxysmal atrial fibrillation: Secondary | ICD-10-CM

## 2020-05-18 DIAGNOSIS — Z952 Presence of prosthetic heart valve: Secondary | ICD-10-CM

## 2020-05-18 NOTE — Telephone Encounter (Signed)
Fax received mdINR PT/INR self testing service Test date/time 05/17/20 931 am INR 2.1

## 2020-05-18 NOTE — Telephone Encounter (Signed)
Continue coumadin as is °

## 2020-05-18 NOTE — Telephone Encounter (Signed)
[name] aware to continue coumadin as is

## 2020-05-19 DIAGNOSIS — I4891 Unspecified atrial fibrillation: Secondary | ICD-10-CM | POA: Diagnosis not present

## 2020-05-19 DIAGNOSIS — I2510 Atherosclerotic heart disease of native coronary artery without angina pectoris: Secondary | ICD-10-CM | POA: Diagnosis not present

## 2020-05-19 DIAGNOSIS — I472 Ventricular tachycardia: Secondary | ICD-10-CM | POA: Diagnosis not present

## 2020-05-19 DIAGNOSIS — E785 Hyperlipidemia, unspecified: Secondary | ICD-10-CM | POA: Diagnosis not present

## 2020-05-19 DIAGNOSIS — M810 Age-related osteoporosis without current pathological fracture: Secondary | ICD-10-CM | POA: Diagnosis not present

## 2020-05-19 DIAGNOSIS — R32 Unspecified urinary incontinence: Secondary | ICD-10-CM | POA: Diagnosis not present

## 2020-05-19 DIAGNOSIS — H9193 Unspecified hearing loss, bilateral: Secondary | ICD-10-CM | POA: Diagnosis not present

## 2020-05-19 DIAGNOSIS — M150 Primary generalized (osteo)arthritis: Secondary | ICD-10-CM | POA: Diagnosis not present

## 2020-05-19 DIAGNOSIS — F0390 Unspecified dementia without behavioral disturbance: Secondary | ICD-10-CM | POA: Diagnosis not present

## 2020-05-20 DIAGNOSIS — F0390 Unspecified dementia without behavioral disturbance: Secondary | ICD-10-CM | POA: Diagnosis not present

## 2020-05-20 DIAGNOSIS — H9193 Unspecified hearing loss, bilateral: Secondary | ICD-10-CM | POA: Diagnosis not present

## 2020-05-20 DIAGNOSIS — M810 Age-related osteoporosis without current pathological fracture: Secondary | ICD-10-CM | POA: Diagnosis not present

## 2020-05-20 DIAGNOSIS — R32 Unspecified urinary incontinence: Secondary | ICD-10-CM | POA: Diagnosis not present

## 2020-05-20 DIAGNOSIS — I4891 Unspecified atrial fibrillation: Secondary | ICD-10-CM | POA: Diagnosis not present

## 2020-05-20 DIAGNOSIS — M150 Primary generalized (osteo)arthritis: Secondary | ICD-10-CM | POA: Diagnosis not present

## 2020-05-20 DIAGNOSIS — I2510 Atherosclerotic heart disease of native coronary artery without angina pectoris: Secondary | ICD-10-CM | POA: Diagnosis not present

## 2020-05-20 DIAGNOSIS — E785 Hyperlipidemia, unspecified: Secondary | ICD-10-CM | POA: Diagnosis not present

## 2020-05-20 DIAGNOSIS — I472 Ventricular tachycardia: Secondary | ICD-10-CM | POA: Diagnosis not present

## 2020-05-24 ENCOUNTER — Telehealth: Payer: Self-pay | Admitting: *Deleted

## 2020-05-24 DIAGNOSIS — H401113 Primary open-angle glaucoma, right eye, severe stage: Secondary | ICD-10-CM | POA: Diagnosis not present

## 2020-05-24 DIAGNOSIS — Z952 Presence of prosthetic heart valve: Secondary | ICD-10-CM

## 2020-05-24 DIAGNOSIS — H401121 Primary open-angle glaucoma, left eye, mild stage: Secondary | ICD-10-CM | POA: Diagnosis not present

## 2020-05-24 DIAGNOSIS — I480 Paroxysmal atrial fibrillation: Secondary | ICD-10-CM

## 2020-05-24 LAB — POCT INR: INR: 2.1 (ref 2.0–3.0)

## 2020-05-24 NOTE — Telephone Encounter (Signed)
Continue coumadin as is °

## 2020-05-24 NOTE — Telephone Encounter (Signed)
Fax received mdINR PT/INR self testing service Test date/time 05/24/20 11:56am INR 2.1

## 2020-05-24 NOTE — Telephone Encounter (Signed)
Patient aware and verbalized understanding. °

## 2020-05-25 DIAGNOSIS — H9193 Unspecified hearing loss, bilateral: Secondary | ICD-10-CM | POA: Diagnosis not present

## 2020-05-25 DIAGNOSIS — R32 Unspecified urinary incontinence: Secondary | ICD-10-CM | POA: Diagnosis not present

## 2020-05-25 DIAGNOSIS — F0390 Unspecified dementia without behavioral disturbance: Secondary | ICD-10-CM | POA: Diagnosis not present

## 2020-05-25 DIAGNOSIS — I472 Ventricular tachycardia: Secondary | ICD-10-CM | POA: Diagnosis not present

## 2020-05-25 DIAGNOSIS — E785 Hyperlipidemia, unspecified: Secondary | ICD-10-CM | POA: Diagnosis not present

## 2020-05-25 DIAGNOSIS — I2510 Atherosclerotic heart disease of native coronary artery without angina pectoris: Secondary | ICD-10-CM | POA: Diagnosis not present

## 2020-05-25 DIAGNOSIS — M150 Primary generalized (osteo)arthritis: Secondary | ICD-10-CM | POA: Diagnosis not present

## 2020-05-25 DIAGNOSIS — M810 Age-related osteoporosis without current pathological fracture: Secondary | ICD-10-CM | POA: Diagnosis not present

## 2020-05-25 DIAGNOSIS — I4891 Unspecified atrial fibrillation: Secondary | ICD-10-CM | POA: Diagnosis not present

## 2020-05-27 DIAGNOSIS — F0390 Unspecified dementia without behavioral disturbance: Secondary | ICD-10-CM | POA: Diagnosis not present

## 2020-05-27 DIAGNOSIS — I4891 Unspecified atrial fibrillation: Secondary | ICD-10-CM | POA: Diagnosis not present

## 2020-05-27 DIAGNOSIS — M810 Age-related osteoporosis without current pathological fracture: Secondary | ICD-10-CM | POA: Diagnosis not present

## 2020-05-27 DIAGNOSIS — I2510 Atherosclerotic heart disease of native coronary artery without angina pectoris: Secondary | ICD-10-CM | POA: Diagnosis not present

## 2020-05-27 DIAGNOSIS — H9193 Unspecified hearing loss, bilateral: Secondary | ICD-10-CM | POA: Diagnosis not present

## 2020-05-27 DIAGNOSIS — R32 Unspecified urinary incontinence: Secondary | ICD-10-CM | POA: Diagnosis not present

## 2020-05-27 DIAGNOSIS — M150 Primary generalized (osteo)arthritis: Secondary | ICD-10-CM | POA: Diagnosis not present

## 2020-05-27 DIAGNOSIS — I472 Ventricular tachycardia: Secondary | ICD-10-CM | POA: Diagnosis not present

## 2020-05-27 DIAGNOSIS — E785 Hyperlipidemia, unspecified: Secondary | ICD-10-CM | POA: Diagnosis not present

## 2020-05-31 DIAGNOSIS — H9193 Unspecified hearing loss, bilateral: Secondary | ICD-10-CM | POA: Diagnosis not present

## 2020-05-31 DIAGNOSIS — I2510 Atherosclerotic heart disease of native coronary artery without angina pectoris: Secondary | ICD-10-CM | POA: Diagnosis not present

## 2020-05-31 DIAGNOSIS — E785 Hyperlipidemia, unspecified: Secondary | ICD-10-CM | POA: Diagnosis not present

## 2020-05-31 DIAGNOSIS — R32 Unspecified urinary incontinence: Secondary | ICD-10-CM | POA: Diagnosis not present

## 2020-05-31 DIAGNOSIS — I472 Ventricular tachycardia: Secondary | ICD-10-CM | POA: Diagnosis not present

## 2020-05-31 DIAGNOSIS — M810 Age-related osteoporosis without current pathological fracture: Secondary | ICD-10-CM | POA: Diagnosis not present

## 2020-05-31 DIAGNOSIS — F0390 Unspecified dementia without behavioral disturbance: Secondary | ICD-10-CM | POA: Diagnosis not present

## 2020-05-31 DIAGNOSIS — I4891 Unspecified atrial fibrillation: Secondary | ICD-10-CM | POA: Diagnosis not present

## 2020-05-31 DIAGNOSIS — M150 Primary generalized (osteo)arthritis: Secondary | ICD-10-CM | POA: Diagnosis not present

## 2020-06-02 DIAGNOSIS — I2510 Atherosclerotic heart disease of native coronary artery without angina pectoris: Secondary | ICD-10-CM | POA: Diagnosis not present

## 2020-06-02 DIAGNOSIS — M150 Primary generalized (osteo)arthritis: Secondary | ICD-10-CM | POA: Diagnosis not present

## 2020-06-02 DIAGNOSIS — M810 Age-related osteoporosis without current pathological fracture: Secondary | ICD-10-CM | POA: Diagnosis not present

## 2020-06-02 DIAGNOSIS — E785 Hyperlipidemia, unspecified: Secondary | ICD-10-CM | POA: Diagnosis not present

## 2020-06-02 DIAGNOSIS — R32 Unspecified urinary incontinence: Secondary | ICD-10-CM | POA: Diagnosis not present

## 2020-06-02 DIAGNOSIS — F0390 Unspecified dementia without behavioral disturbance: Secondary | ICD-10-CM | POA: Diagnosis not present

## 2020-06-02 DIAGNOSIS — I472 Ventricular tachycardia: Secondary | ICD-10-CM | POA: Diagnosis not present

## 2020-06-02 DIAGNOSIS — H9193 Unspecified hearing loss, bilateral: Secondary | ICD-10-CM | POA: Diagnosis not present

## 2020-06-02 DIAGNOSIS — I4891 Unspecified atrial fibrillation: Secondary | ICD-10-CM | POA: Diagnosis not present

## 2020-06-06 ENCOUNTER — Other Ambulatory Visit: Payer: Self-pay | Admitting: Family Medicine

## 2020-06-07 ENCOUNTER — Telehealth: Payer: Self-pay | Admitting: *Deleted

## 2020-06-07 DIAGNOSIS — I4821 Permanent atrial fibrillation: Secondary | ICD-10-CM | POA: Diagnosis not present

## 2020-06-07 DIAGNOSIS — Z952 Presence of prosthetic heart valve: Secondary | ICD-10-CM

## 2020-06-07 DIAGNOSIS — I480 Paroxysmal atrial fibrillation: Secondary | ICD-10-CM

## 2020-06-07 DIAGNOSIS — Z7901 Long term (current) use of anticoagulants: Secondary | ICD-10-CM | POA: Diagnosis not present

## 2020-06-07 NOTE — Telephone Encounter (Signed)
Pt's son aware of provider feedback.

## 2020-06-07 NOTE — Telephone Encounter (Signed)
Continue coumadin as is °

## 2020-06-07 NOTE — Telephone Encounter (Signed)
Fax received mdINR PT/INR self testing service Test date/time 06/07/20 1113 am INR 2.1

## 2020-06-08 DIAGNOSIS — R32 Unspecified urinary incontinence: Secondary | ICD-10-CM | POA: Diagnosis not present

## 2020-06-08 DIAGNOSIS — I472 Ventricular tachycardia: Secondary | ICD-10-CM | POA: Diagnosis not present

## 2020-06-08 DIAGNOSIS — F0390 Unspecified dementia without behavioral disturbance: Secondary | ICD-10-CM | POA: Diagnosis not present

## 2020-06-08 DIAGNOSIS — I2510 Atherosclerotic heart disease of native coronary artery without angina pectoris: Secondary | ICD-10-CM | POA: Diagnosis not present

## 2020-06-08 DIAGNOSIS — H9193 Unspecified hearing loss, bilateral: Secondary | ICD-10-CM | POA: Diagnosis not present

## 2020-06-08 DIAGNOSIS — E785 Hyperlipidemia, unspecified: Secondary | ICD-10-CM | POA: Diagnosis not present

## 2020-06-08 DIAGNOSIS — M150 Primary generalized (osteo)arthritis: Secondary | ICD-10-CM | POA: Diagnosis not present

## 2020-06-08 DIAGNOSIS — M810 Age-related osteoporosis without current pathological fracture: Secondary | ICD-10-CM | POA: Diagnosis not present

## 2020-06-08 DIAGNOSIS — I4891 Unspecified atrial fibrillation: Secondary | ICD-10-CM | POA: Diagnosis not present

## 2020-06-11 ENCOUNTER — Other Ambulatory Visit: Payer: Self-pay | Admitting: Family Medicine

## 2020-06-12 DIAGNOSIS — I472 Ventricular tachycardia: Secondary | ICD-10-CM | POA: Diagnosis not present

## 2020-06-12 DIAGNOSIS — R32 Unspecified urinary incontinence: Secondary | ICD-10-CM | POA: Diagnosis not present

## 2020-06-12 DIAGNOSIS — H9193 Unspecified hearing loss, bilateral: Secondary | ICD-10-CM | POA: Diagnosis not present

## 2020-06-12 DIAGNOSIS — F0390 Unspecified dementia without behavioral disturbance: Secondary | ICD-10-CM | POA: Diagnosis not present

## 2020-06-12 DIAGNOSIS — M810 Age-related osteoporosis without current pathological fracture: Secondary | ICD-10-CM | POA: Diagnosis not present

## 2020-06-12 DIAGNOSIS — I2510 Atherosclerotic heart disease of native coronary artery without angina pectoris: Secondary | ICD-10-CM | POA: Diagnosis not present

## 2020-06-12 DIAGNOSIS — I4891 Unspecified atrial fibrillation: Secondary | ICD-10-CM | POA: Diagnosis not present

## 2020-06-12 DIAGNOSIS — E785 Hyperlipidemia, unspecified: Secondary | ICD-10-CM | POA: Diagnosis not present

## 2020-06-12 DIAGNOSIS — M150 Primary generalized (osteo)arthritis: Secondary | ICD-10-CM | POA: Diagnosis not present

## 2020-06-14 ENCOUNTER — Telehealth: Payer: Self-pay | Admitting: *Deleted

## 2020-06-14 DIAGNOSIS — R32 Unspecified urinary incontinence: Secondary | ICD-10-CM | POA: Diagnosis not present

## 2020-06-14 DIAGNOSIS — E785 Hyperlipidemia, unspecified: Secondary | ICD-10-CM | POA: Diagnosis not present

## 2020-06-14 DIAGNOSIS — M810 Age-related osteoporosis without current pathological fracture: Secondary | ICD-10-CM | POA: Diagnosis not present

## 2020-06-14 DIAGNOSIS — H9193 Unspecified hearing loss, bilateral: Secondary | ICD-10-CM | POA: Diagnosis not present

## 2020-06-14 DIAGNOSIS — M150 Primary generalized (osteo)arthritis: Secondary | ICD-10-CM | POA: Diagnosis not present

## 2020-06-14 DIAGNOSIS — I4891 Unspecified atrial fibrillation: Secondary | ICD-10-CM | POA: Diagnosis not present

## 2020-06-14 DIAGNOSIS — Z952 Presence of prosthetic heart valve: Secondary | ICD-10-CM

## 2020-06-14 DIAGNOSIS — I2510 Atherosclerotic heart disease of native coronary artery without angina pectoris: Secondary | ICD-10-CM | POA: Diagnosis not present

## 2020-06-14 DIAGNOSIS — F0390 Unspecified dementia without behavioral disturbance: Secondary | ICD-10-CM | POA: Diagnosis not present

## 2020-06-14 DIAGNOSIS — I480 Paroxysmal atrial fibrillation: Secondary | ICD-10-CM

## 2020-06-14 DIAGNOSIS — I472 Ventricular tachycardia: Secondary | ICD-10-CM | POA: Diagnosis not present

## 2020-06-14 NOTE — Telephone Encounter (Signed)
   Change coumadin to 4 mg(2- 2 mg tablets) daily except 3mg  table on wed, sat and sun Recheck in 1 week

## 2020-06-14 NOTE — Telephone Encounter (Signed)
Pt's son is aware of provider feedback and voiced understanding. °

## 2020-06-14 NOTE — Telephone Encounter (Signed)
Fax received mdINR PT/INR self testing service Test date/time 06/14/20 1248 pm INR 1.9

## 2020-06-21 ENCOUNTER — Telehealth: Payer: Self-pay | Admitting: *Deleted

## 2020-06-21 DIAGNOSIS — Z952 Presence of prosthetic heart valve: Secondary | ICD-10-CM

## 2020-06-21 DIAGNOSIS — I480 Paroxysmal atrial fibrillation: Secondary | ICD-10-CM

## 2020-06-21 NOTE — Telephone Encounter (Signed)
[name] aware to continued current coumadin

## 2020-06-21 NOTE — Telephone Encounter (Signed)
Continue coumadin as is °

## 2020-06-21 NOTE — Telephone Encounter (Signed)
Fax received mdINR PT/INR self testing service Test date/time 06/21/20 1036 am INR 1.9

## 2020-06-23 DIAGNOSIS — F0390 Unspecified dementia without behavioral disturbance: Secondary | ICD-10-CM | POA: Diagnosis not present

## 2020-06-23 DIAGNOSIS — I4891 Unspecified atrial fibrillation: Secondary | ICD-10-CM | POA: Diagnosis not present

## 2020-06-23 DIAGNOSIS — M150 Primary generalized (osteo)arthritis: Secondary | ICD-10-CM | POA: Diagnosis not present

## 2020-06-23 DIAGNOSIS — R32 Unspecified urinary incontinence: Secondary | ICD-10-CM | POA: Diagnosis not present

## 2020-06-23 DIAGNOSIS — I472 Ventricular tachycardia: Secondary | ICD-10-CM | POA: Diagnosis not present

## 2020-06-23 DIAGNOSIS — I2510 Atherosclerotic heart disease of native coronary artery without angina pectoris: Secondary | ICD-10-CM | POA: Diagnosis not present

## 2020-06-23 DIAGNOSIS — H9193 Unspecified hearing loss, bilateral: Secondary | ICD-10-CM | POA: Diagnosis not present

## 2020-06-23 DIAGNOSIS — M810 Age-related osteoporosis without current pathological fracture: Secondary | ICD-10-CM | POA: Diagnosis not present

## 2020-06-23 DIAGNOSIS — E785 Hyperlipidemia, unspecified: Secondary | ICD-10-CM | POA: Diagnosis not present

## 2020-06-25 ENCOUNTER — Ambulatory Visit (INDEPENDENT_AMBULATORY_CARE_PROVIDER_SITE_OTHER): Payer: Medicare HMO

## 2020-06-25 ENCOUNTER — Other Ambulatory Visit: Payer: Self-pay

## 2020-06-25 DIAGNOSIS — M150 Primary generalized (osteo)arthritis: Secondary | ICD-10-CM

## 2020-06-25 DIAGNOSIS — E785 Hyperlipidemia, unspecified: Secondary | ICD-10-CM | POA: Diagnosis not present

## 2020-06-25 DIAGNOSIS — Z952 Presence of prosthetic heart valve: Secondary | ICD-10-CM

## 2020-06-25 DIAGNOSIS — I4891 Unspecified atrial fibrillation: Secondary | ICD-10-CM

## 2020-06-25 DIAGNOSIS — I2510 Atherosclerotic heart disease of native coronary artery without angina pectoris: Secondary | ICD-10-CM

## 2020-06-25 DIAGNOSIS — Z9181 History of falling: Secondary | ICD-10-CM

## 2020-06-25 DIAGNOSIS — M810 Age-related osteoporosis without current pathological fracture: Secondary | ICD-10-CM | POA: Diagnosis not present

## 2020-06-25 DIAGNOSIS — F0390 Unspecified dementia without behavioral disturbance: Secondary | ICD-10-CM | POA: Diagnosis not present

## 2020-06-25 DIAGNOSIS — H409 Unspecified glaucoma: Secondary | ICD-10-CM

## 2020-06-25 DIAGNOSIS — R32 Unspecified urinary incontinence: Secondary | ICD-10-CM

## 2020-06-25 DIAGNOSIS — I472 Ventricular tachycardia: Secondary | ICD-10-CM

## 2020-06-25 DIAGNOSIS — H9193 Unspecified hearing loss, bilateral: Secondary | ICD-10-CM | POA: Diagnosis not present

## 2020-06-25 DIAGNOSIS — Z950 Presence of cardiac pacemaker: Secondary | ICD-10-CM

## 2020-06-25 DIAGNOSIS — Z8679 Personal history of other diseases of the circulatory system: Secondary | ICD-10-CM

## 2020-06-25 DIAGNOSIS — Z86718 Personal history of other venous thrombosis and embolism: Secondary | ICD-10-CM

## 2020-06-25 DIAGNOSIS — Z7901 Long term (current) use of anticoagulants: Secondary | ICD-10-CM

## 2020-06-28 ENCOUNTER — Telehealth: Payer: Self-pay | Admitting: *Deleted

## 2020-06-28 DIAGNOSIS — Z952 Presence of prosthetic heart valve: Secondary | ICD-10-CM

## 2020-06-28 DIAGNOSIS — I480 Paroxysmal atrial fibrillation: Secondary | ICD-10-CM

## 2020-06-28 NOTE — Telephone Encounter (Signed)
lmtcb

## 2020-06-28 NOTE — Telephone Encounter (Signed)
[name] aware to continue coumadin as is

## 2020-06-28 NOTE — Telephone Encounter (Signed)
Continue coumadin as is °

## 2020-06-28 NOTE — Telephone Encounter (Signed)
Fax received mdINR PT/INR self testing service Test date/time 06/27/20 1152 am INR 2.2

## 2020-06-30 DIAGNOSIS — I4891 Unspecified atrial fibrillation: Secondary | ICD-10-CM | POA: Diagnosis not present

## 2020-06-30 DIAGNOSIS — F0390 Unspecified dementia without behavioral disturbance: Secondary | ICD-10-CM | POA: Diagnosis not present

## 2020-06-30 DIAGNOSIS — M810 Age-related osteoporosis without current pathological fracture: Secondary | ICD-10-CM | POA: Diagnosis not present

## 2020-06-30 DIAGNOSIS — M150 Primary generalized (osteo)arthritis: Secondary | ICD-10-CM | POA: Diagnosis not present

## 2020-06-30 DIAGNOSIS — I2510 Atherosclerotic heart disease of native coronary artery without angina pectoris: Secondary | ICD-10-CM | POA: Diagnosis not present

## 2020-06-30 DIAGNOSIS — H9193 Unspecified hearing loss, bilateral: Secondary | ICD-10-CM | POA: Diagnosis not present

## 2020-06-30 DIAGNOSIS — R32 Unspecified urinary incontinence: Secondary | ICD-10-CM | POA: Diagnosis not present

## 2020-06-30 DIAGNOSIS — E785 Hyperlipidemia, unspecified: Secondary | ICD-10-CM | POA: Diagnosis not present

## 2020-06-30 DIAGNOSIS — I472 Ventricular tachycardia: Secondary | ICD-10-CM | POA: Diagnosis not present

## 2020-07-05 ENCOUNTER — Telehealth: Payer: Self-pay | Admitting: *Deleted

## 2020-07-05 DIAGNOSIS — Z7901 Long term (current) use of anticoagulants: Secondary | ICD-10-CM | POA: Diagnosis not present

## 2020-07-05 DIAGNOSIS — Z952 Presence of prosthetic heart valve: Secondary | ICD-10-CM

## 2020-07-05 DIAGNOSIS — I480 Paroxysmal atrial fibrillation: Secondary | ICD-10-CM

## 2020-07-05 DIAGNOSIS — I4821 Permanent atrial fibrillation: Secondary | ICD-10-CM | POA: Diagnosis not present

## 2020-07-05 NOTE — Telephone Encounter (Signed)
Patient aware and verbalized understanding. °

## 2020-07-05 NOTE — Telephone Encounter (Signed)
Fax received mdINR PT/INR self testing service Test date/time 07/05/20 1113 am INR 2.0

## 2020-07-05 NOTE — Telephone Encounter (Signed)
Continue coumadin as is °

## 2020-07-07 DIAGNOSIS — R32 Unspecified urinary incontinence: Secondary | ICD-10-CM | POA: Diagnosis not present

## 2020-07-07 DIAGNOSIS — E785 Hyperlipidemia, unspecified: Secondary | ICD-10-CM | POA: Diagnosis not present

## 2020-07-07 DIAGNOSIS — M810 Age-related osteoporosis without current pathological fracture: Secondary | ICD-10-CM | POA: Diagnosis not present

## 2020-07-07 DIAGNOSIS — F0390 Unspecified dementia without behavioral disturbance: Secondary | ICD-10-CM | POA: Diagnosis not present

## 2020-07-07 DIAGNOSIS — M150 Primary generalized (osteo)arthritis: Secondary | ICD-10-CM | POA: Diagnosis not present

## 2020-07-07 DIAGNOSIS — I472 Ventricular tachycardia: Secondary | ICD-10-CM | POA: Diagnosis not present

## 2020-07-07 DIAGNOSIS — H9193 Unspecified hearing loss, bilateral: Secondary | ICD-10-CM | POA: Diagnosis not present

## 2020-07-07 DIAGNOSIS — I2510 Atherosclerotic heart disease of native coronary artery without angina pectoris: Secondary | ICD-10-CM | POA: Diagnosis not present

## 2020-07-07 DIAGNOSIS — I4891 Unspecified atrial fibrillation: Secondary | ICD-10-CM | POA: Diagnosis not present

## 2020-07-12 ENCOUNTER — Telehealth: Payer: Self-pay | Admitting: *Deleted

## 2020-07-12 DIAGNOSIS — I480 Paroxysmal atrial fibrillation: Secondary | ICD-10-CM

## 2020-07-12 DIAGNOSIS — Z952 Presence of prosthetic heart valve: Secondary | ICD-10-CM

## 2020-07-12 NOTE — Telephone Encounter (Signed)
Fax received mdINR PT/INR self testing service Test date/time 07/12/20 1015 am INR 2.1

## 2020-07-12 NOTE — Telephone Encounter (Signed)
Ok but chart say 2.5-3.5 range

## 2020-07-12 NOTE — Telephone Encounter (Signed)
Patient's son states that Dr. Darlyn Read like to keep patient's INR in this range and would like to keep coumadin dosage as is

## 2020-07-12 NOTE — Telephone Encounter (Signed)
°  Change coumadin to 4mg  daily except 1mg  on Sunday- recheck in 1 week

## 2020-07-13 NOTE — Telephone Encounter (Signed)
LEave coumadin as is.

## 2020-07-13 NOTE — Telephone Encounter (Signed)
Son aware.

## 2020-07-20 ENCOUNTER — Telehealth: Payer: Self-pay | Admitting: *Deleted

## 2020-07-20 DIAGNOSIS — Z952 Presence of prosthetic heart valve: Secondary | ICD-10-CM

## 2020-07-20 DIAGNOSIS — I480 Paroxysmal atrial fibrillation: Secondary | ICD-10-CM

## 2020-07-20 NOTE — Telephone Encounter (Signed)
Pt made aware. No concerns at this time.

## 2020-07-20 NOTE — Telephone Encounter (Signed)
Continue coumadin as is °

## 2020-07-20 NOTE — Telephone Encounter (Signed)
Fax received mdINR PT/INR self testing service Test date/time 07/19/20 1040 am INR 1.8

## 2020-07-22 ENCOUNTER — Other Ambulatory Visit: Payer: Self-pay | Admitting: Family Medicine

## 2020-07-22 DIAGNOSIS — G301 Alzheimer's disease with late onset: Secondary | ICD-10-CM

## 2020-07-22 DIAGNOSIS — F0150 Vascular dementia without behavioral disturbance: Secondary | ICD-10-CM

## 2020-07-26 ENCOUNTER — Telehealth: Payer: Self-pay | Admitting: *Deleted

## 2020-07-26 DIAGNOSIS — I480 Paroxysmal atrial fibrillation: Secondary | ICD-10-CM

## 2020-07-26 DIAGNOSIS — Z952 Presence of prosthetic heart valve: Secondary | ICD-10-CM

## 2020-07-26 NOTE — Telephone Encounter (Signed)
Continue coumadin as is °

## 2020-07-26 NOTE — Telephone Encounter (Signed)
Son aware.

## 2020-07-26 NOTE — Telephone Encounter (Signed)
Fax received mdINR PT/INR self testing service Test date/time 07/26/20 1139 am INR 1.9

## 2020-08-02 ENCOUNTER — Telehealth: Payer: Self-pay | Admitting: *Deleted

## 2020-08-02 DIAGNOSIS — Z952 Presence of prosthetic heart valve: Secondary | ICD-10-CM

## 2020-08-02 DIAGNOSIS — I480 Paroxysmal atrial fibrillation: Secondary | ICD-10-CM

## 2020-08-02 DIAGNOSIS — I4821 Permanent atrial fibrillation: Secondary | ICD-10-CM | POA: Diagnosis not present

## 2020-08-02 DIAGNOSIS — Z7901 Long term (current) use of anticoagulants: Secondary | ICD-10-CM | POA: Diagnosis not present

## 2020-08-02 NOTE — Telephone Encounter (Signed)
Son aware and verbalizes understanding.  

## 2020-08-02 NOTE — Telephone Encounter (Signed)
Fax received mdINR PT/INR self testing service Test date/time 08/02/20 1128 am INR 2.3

## 2020-08-02 NOTE — Telephone Encounter (Signed)
Continue coumadin as is °

## 2020-08-09 ENCOUNTER — Telehealth: Payer: Self-pay | Admitting: *Deleted

## 2020-08-09 DIAGNOSIS — Z952 Presence of prosthetic heart valve: Secondary | ICD-10-CM

## 2020-08-09 DIAGNOSIS — I480 Paroxysmal atrial fibrillation: Secondary | ICD-10-CM

## 2020-08-09 NOTE — Telephone Encounter (Signed)
Change coumadin to 4mg  daily

## 2020-08-09 NOTE — Telephone Encounter (Signed)
Fax received mdINR PT/INR self testing service Test date/time 08/09/20 1120 am INR 2.3

## 2020-08-10 NOTE — Telephone Encounter (Signed)
Pt's son is aware of provider feedback and voiced understanding. °

## 2020-08-16 ENCOUNTER — Other Ambulatory Visit: Payer: Self-pay | Admitting: Family Medicine

## 2020-08-16 ENCOUNTER — Telehealth: Payer: Self-pay

## 2020-08-16 ENCOUNTER — Telehealth: Payer: Self-pay | Admitting: *Deleted

## 2020-08-16 DIAGNOSIS — I480 Paroxysmal atrial fibrillation: Secondary | ICD-10-CM

## 2020-08-16 DIAGNOSIS — Z952 Presence of prosthetic heart valve: Secondary | ICD-10-CM

## 2020-08-16 DIAGNOSIS — F0150 Vascular dementia without behavioral disturbance: Secondary | ICD-10-CM

## 2020-08-16 DIAGNOSIS — F02818 Dementia in other diseases classified elsewhere, unspecified severity, with other behavioral disturbance: Secondary | ICD-10-CM

## 2020-08-16 NOTE — Telephone Encounter (Signed)
Patients son aware 

## 2020-08-16 NOTE — Telephone Encounter (Signed)
Fax received mdINR PT/INR self testing service Test date/time 08/16/20 1231pm INR 2.2

## 2020-08-16 NOTE — Telephone Encounter (Signed)
Continue coumadin as is °

## 2020-08-16 NOTE — Telephone Encounter (Signed)
Please make appt. For him to see me Tomorrow as part of my acute day. Thanks, Broadus John

## 2020-08-17 ENCOUNTER — Other Ambulatory Visit: Payer: Self-pay

## 2020-08-17 ENCOUNTER — Ambulatory Visit (INDEPENDENT_AMBULATORY_CARE_PROVIDER_SITE_OTHER): Payer: Medicare HMO | Admitting: Family Medicine

## 2020-08-17 ENCOUNTER — Encounter: Payer: Self-pay | Admitting: Family Medicine

## 2020-08-17 VITALS — BP 97/63 | HR 83 | Temp 97.6°F | Resp 20 | Ht 68.0 in | Wt 124.0 lb

## 2020-08-17 DIAGNOSIS — E039 Hypothyroidism, unspecified: Secondary | ICD-10-CM

## 2020-08-17 DIAGNOSIS — R410 Disorientation, unspecified: Secondary | ICD-10-CM | POA: Diagnosis not present

## 2020-08-17 DIAGNOSIS — Z7901 Long term (current) use of anticoagulants: Secondary | ICD-10-CM

## 2020-08-17 DIAGNOSIS — Z952 Presence of prosthetic heart valve: Secondary | ICD-10-CM

## 2020-08-17 DIAGNOSIS — Z23 Encounter for immunization: Secondary | ICD-10-CM | POA: Diagnosis not present

## 2020-08-17 DIAGNOSIS — F0150 Vascular dementia without behavioral disturbance: Secondary | ICD-10-CM | POA: Diagnosis not present

## 2020-08-17 DIAGNOSIS — M810 Age-related osteoporosis without current pathological fracture: Secondary | ICD-10-CM | POA: Diagnosis not present

## 2020-08-17 NOTE — Progress Notes (Signed)
Subjective:  Patient ID: [name], male    DOB: 11/21/1927  Age: 86 y.o. MRN: [id number]  CC: Confusion, memory loss   HPI [name] presents for 3 days of more confusion and irritability.  His son brings him in today concerned about worsening of the dementia.  He also says that his father was shouting at the caregiver last night and he had to calm him down.  His father called him back a couple of hours later and apologized for his behavior.  Today he seems to be more like his usual self according to his son, [name].  However the patient does have significant memory loss and a follow-up MMSE was performed today as a result of the reports of confusion.  There is no excessive bleeding noted.  And he is not complained of a severe headache etc.  He does continue to take his Coumadin and he is in therapeutic range with his level running 2.2 yesterday.  He could not give a urine specimen today but his son says he will bring 1 back tomorrow.  Patient also has a history of thyroid disease but has not been available for blood work for many months.  It is possible that an underactive thyroid has caused some of his symptoms should his dose need to be adjusted.  Therefore that will be checked today.  Depression screen [facility] 2/9 08/17/2020 04/20/2020 10/29/2019  Decreased Interest 0 0 0  Down, Depressed, Hopeless 0 0 0  PHQ - 2 Score 0 0 0  Altered sleeping - - -  Tired, decreased energy - - -  Change in appetite - - -  Feeling bad or failure about yourself  - - -  Trouble concentrating - - -  Moving slowly or fidgety/restless - - -  Suicidal thoughts - - -  PHQ-9 Score - - -  Difficult doing work/chores - - -  Some recent data might be hidden    History [name] has a past medical history of Aortic stenosis, CAD (coronary artery disease), Carotid stenosis, Cataract, CVA (cerebral infarction) (12/2010), DVT (deep venous thrombosis) ([place]), History of pulmonary embolism, History of rib fracture,  History of shingles, Hyperlipidemia, Hypothyroidism, Pacemaker  Biotronik, Personal history of subdural hematoma, S/P cholecystectomy (2005), S/P shoulder surgery, Stroke ([place]) (2012), and Syncope.   He has a past surgical history that includes Aortic valve replacement (1985); Cardiac catheterization; Vascular surgery; Brain surgery; permanent pacemaker insertion (N/A, 08/01/2012); and Eye surgery.   His family history includes Arthritis in his sister; Cancer in his father; Coronary artery disease in an other family member; Heart attack in his brother, brother, and father; Heart disease in his brother and father; Stroke in his mother.He reports that he has never smoked. He has never used smokeless tobacco. He reports that he does not drink alcohol and does not use drugs.    ROS Review of Systems  Constitutional: Negative for fever.  Respiratory: Negative for shortness of breath.   Cardiovascular: Negative for chest pain.  Musculoskeletal: Negative for arthralgias.  Skin: Negative for rash.  Neurological: Positive for dizziness (off balance, wlaking sideways at times.).  Psychiatric/Behavioral: Positive for agitation, behavioral problems, confusion and decreased concentration. Negative for sleep disturbance. The patient is nervous/anxious.     Objective:  BP 97/63   Pulse 83   Temp 97.6 F (36.4 C) (Temporal)   Resp 20   Ht $R'5\' 8"'Fi$  (1.727 m)   Wt 124 lb (56.2 kg)   SpO2 99%  BMI 18.85 kg/m   BP Readings from Last 3 Encounters:  08/17/20 97/63  04/20/20 117/64  10/29/19 117/69    Wt Readings from Last 3 Encounters:  08/17/20 124 lb (56.2 kg)  04/20/20 130 lb (59 kg)  10/29/19 132 lb (59.9 kg)     Physical Exam Vitals reviewed.  Constitutional:      Appearance: He is well-developed.  HENT:     Head: Normocephalic and atraumatic.     Right Ear: Tympanic membrane and external ear normal. No decreased hearing noted.     Left Ear: Tympanic membrane and external ear normal.  No decreased hearing noted.     Mouth/Throat:     Pharynx: No oropharyngeal exudate or posterior oropharyngeal erythema.  Eyes:     Pupils: Pupils are equal, round, and reactive to light.  Cardiovascular:     Rate and Rhythm: Normal rate and regular rhythm.     Heart sounds: No murmur heard.   Pulmonary:     Effort: No respiratory distress.     Breath sounds: Normal breath sounds.  Abdominal:     General: Bowel sounds are normal.     Palpations: Abdomen is soft. There is no mass.     Tenderness: There is no abdominal tenderness.  Musculoskeletal:     Cervical back: Normal range of motion and neck supple.       Assessment & Plan:   Gio was seen today for confusion, memory loss.  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 Pendergraft maintain his multivitamin, docusate sodium, latanoprost, dorzolamide, brimonidine-timolol, potassium chloride, levothyroxine, tamsulosin, warfarin, warfarin, pantoprazole, and donepezil.  Allergies as of 08/17/2020      Reactions   Ivp Dye [iodinated Diagnostic Agents] Swelling   Swelling   Nabumetone Hives   Codeine Nausea Only   Nausea   Sulfonamide Derivatives Hives, Itching   Hives itching      Medication List       Accurate as of August 17, 2020  9:44 PM. If you have any questions, ask your nurse or doctor.        Combigan 0.2-0.5 % ophthalmic solution Generic drug: brimonidine-timolol Place 1 drop into the right eye 2 (two) times daily.    docusate sodium 100 MG capsule Commonly known as: COLACE Take 100 mg by mouth daily.   donepezil 10 MG tablet Commonly known as: ARICEPT Take 1 tablet (10 mg total) by mouth at bedtime.   dorzolamide 2 % ophthalmic solution Commonly known as: TRUSOPT Place 1 drop into the right eye 2 (two) times daily.   latanoprost 0.005 % ophthalmic solution Commonly known as: XALATAN Place 1 drop into both eyes at bedtime.   levothyroxine 88 MCG tablet Commonly known as: SYNTHROID TAKE 1 TABLET EVERY DAY   multivitamin tablet Take 1 tablet by mouth daily.   pantoprazole 40 MG tablet Commonly known as: PROTONIX  TAKE 1 TABLET EVERY DAY   potassium chloride 10 MEQ tablet Commonly known as: KLOR-CON Take 10 mEq by mouth daily as needed.   tamsulosin 0.4 MG Caps capsule Commonly known as: FLOMAX TAKE 1 CAPSULE DAILY AFTER BREAKFAST   warfarin 3 MG tablet Commonly known as: COUMADIN Take as directed by the anticoagulation clinic. If you are unsure how to take this medication, talk to your nurse or doctor. Original instructions: TAKE 1 EVERY OTHER DAY ALTERNATING WITH 2 MG ON OPPOSITE DAYS   warfarin 2 MG tablet Commonly known as: COUMADIN Take as directed by the anticoagulation clinic. If you are unsure how to take this medication, talk to your nurse or doctor. Original instructions: TAKE 1 EVERY OTHER DAY ALTERNATING WITH 3 MG ON OPPOSITE DAYS        Follow-up: Return in about 6 weeks (around 09/28/2020), or if symptoms worsen or fail to improve.  Claretta Fraise, M.D.

## 2020-08-18 DIAGNOSIS — R410 Disorientation, unspecified: Secondary | ICD-10-CM | POA: Diagnosis not present

## 2020-08-18 LAB — MICROSCOPIC EXAMINATION
Bacteria, UA: NONE SEEN
RBC, Urine: NONE SEEN /hpf (ref 0–2)
WBC, UA: NONE SEEN /hpf (ref 0–5)

## 2020-08-18 LAB — URINALYSIS, COMPLETE
Bilirubin, UA: NEGATIVE
Glucose, UA: NEGATIVE
Ketones, UA: NEGATIVE
Leukocytes,UA: NEGATIVE
Nitrite, UA: NEGATIVE
Protein,UA: NEGATIVE
RBC, UA: NEGATIVE
Specific Gravity, UA: 1.02 (ref 1.005–1.030)
Urobilinogen, Ur: 0.2 mg/dL (ref 0.2–1.0)
pH, UA: 7.5 (ref 5.0–7.5)

## 2020-08-18 LAB — CBC WITH DIFFERENTIAL/PLATELET
Basophils Absolute: 0 10*3/uL (ref 0.0–0.2)
Basos: 1 %
EOS (ABSOLUTE): 0.1 10*3/uL (ref 0.0–0.4)
Eos: 3 %
Hematocrit: 37 % — ABNORMAL LOW (ref 37.5–51.0)
Hemoglobin: 12.2 g/dL — ABNORMAL LOW (ref 13.0–17.7)
Immature Grans (Abs): 0 10*3/uL (ref 0.0–0.1)
Immature Granulocytes: 0 %
Lymphocytes Absolute: 0.8 10*3/uL (ref 0.7–3.1)
Lymphs: 22 %
MCH: 32.8 pg (ref 26.6–33.0)
MCHC: 33 g/dL (ref 31.5–35.7)
MCV: 100 fL — ABNORMAL HIGH (ref 79–97)
Monocytes Absolute: 0.4 10*3/uL (ref 0.1–0.9)
Monocytes: 12 %
Neutrophils Absolute: 2.3 10*3/uL (ref 1.4–7.0)
Neutrophils: 62 %
Platelets: 147 10*3/uL — ABNORMAL LOW (ref 150–450)
RBC: 3.72 x10E6/uL — ABNORMAL LOW (ref 4.14–5.80)
RDW: 12.5 % (ref 11.6–15.4)
WBC: 3.7 10*3/uL (ref 3.4–10.8)

## 2020-08-18 LAB — CMP14+EGFR
ALT: 9 IU/L (ref 0–44)
AST: 23 IU/L (ref 0–40)
Albumin/Globulin Ratio: 1.2 (ref 1.2–2.2)
Albumin: 3.9 g/dL (ref 3.5–4.6)
Alkaline Phosphatase: 79 IU/L (ref 44–121)
BUN/Creatinine Ratio: 14 (ref 10–24)
BUN: 16 mg/dL (ref 10–36)
Bilirubin Total: 0.6 mg/dL (ref 0.0–1.2)
CO2: 26 mmol/L (ref 20–29)
Calcium: 8.8 mg/dL (ref 8.6–10.2)
Chloride: 106 mmol/L (ref 96–106)
Creatinine, Ser: 1.18 mg/dL (ref 0.76–1.27)
GFR calc Af Amer: 62 mL/min/{1.73_m2} (ref >59)
GFR calc non Af Amer: 54 mL/min/{1.73_m2} — ABNORMAL LOW (ref >59)
Globulin, Total: 3.2 g/dL (ref 1.5–4.5)
Glucose: 84 mg/dL (ref 65–99)
Potassium: 4.4 mmol/L (ref 3.5–5.2)
Sodium: 142 mmol/L (ref 134–144)
Total Protein: 7.1 g/dL (ref 6.0–8.5)

## 2020-08-18 LAB — TSH+FREE T4
Free T4: 1.6 ng/dL (ref 0.82–1.77)
TSH: 1.92 u[IU]/mL (ref 0.450–4.500)

## 2020-08-18 LAB — VITAMIN B12: Vitamin B-12: >2000 pg/mL — ABNORMAL HIGH (ref 232–1245)

## 2020-08-18 LAB — VITAMIN D 25 HYDROXY (VIT D DEFICIENCY, FRACTURES): Vit D, 25-Hydroxy: 45.1 ng/mL (ref 30.0–100.0)

## 2020-08-20 ENCOUNTER — Telehealth: Payer: Self-pay

## 2020-08-20 DIAGNOSIS — N3000 Acute cystitis without hematuria: Secondary | ICD-10-CM

## 2020-08-20 MED ORDER — CEPHALEXIN 500 MG PO CAPS: 500.0000 mg | ORAL_CAPSULE | Freq: Four times a day (QID) | ORAL | 0 refills | Status: DC

## 2020-08-20 NOTE — Telephone Encounter (Signed)
There are no significant abnormalities in his lab work but he does have a UTI. I have sent an antibiotic for him to start. Sent to [facility] in [place].

## 2020-08-20 NOTE — Telephone Encounter (Signed)
Britney, this is a Lawyer patient.  Please review and advise.

## 2020-08-20 NOTE — Telephone Encounter (Signed)
Patient's son aware of results and medication sent to pharmacy.

## 2020-08-20 NOTE — Telephone Encounter (Signed)
Pts son called requesting that Dr Darlyn Read review pts lab results so that nurse can call pt to go over them.

## 2020-08-21 ENCOUNTER — Other Ambulatory Visit: Payer: Self-pay | Admitting: Family Medicine

## 2020-08-21 LAB — URINE CULTURE

## 2020-08-21 MED ORDER — DOXYCYCLINE HYCLATE 100 MG PO CAPS: 100.0000 mg | ORAL_CAPSULE | Freq: Two times a day (BID) | ORAL | 0 refills | Status: DC

## 2020-08-21 NOTE — Progress Notes (Signed)
Your culture shows the presence of a germ that is resistant to the current antibiotic you are taking. Please discontinue that medication and take the new one I have sent to your pharmacy.  Best Regards, Tykira Wachs, M.D.  

## 2020-08-23 ENCOUNTER — Telehealth: Payer: Self-pay

## 2020-08-23 NOTE — Telephone Encounter (Signed)
Pt's son is aware of provider feedback and voiced understanding. °

## 2020-08-23 NOTE — Telephone Encounter (Signed)
LEave coumadin as is. Let them know I sent in a change of antibiotic over the weekend for his UTI. MAke sure that they were able to pick it up.  Thanks,

## 2020-08-30 ENCOUNTER — Telehealth: Payer: Self-pay | Admitting: *Deleted

## 2020-08-30 DIAGNOSIS — Z952 Presence of prosthetic heart valve: Secondary | ICD-10-CM

## 2020-08-30 DIAGNOSIS — I480 Paroxysmal atrial fibrillation: Secondary | ICD-10-CM

## 2020-08-30 NOTE — Telephone Encounter (Signed)
Fax received mdINR PT/INR self testing service Test date/time 08/30/20 1101 am INR 2.3

## 2020-08-30 NOTE — Telephone Encounter (Signed)
Continue coumadin as is °

## 2020-08-30 NOTE — Telephone Encounter (Signed)
Patient aware and verbalizes understanding. 

## 2020-09-06 ENCOUNTER — Telehealth: Payer: Self-pay | Admitting: *Deleted

## 2020-09-06 DIAGNOSIS — Z7901 Long term (current) use of anticoagulants: Secondary | ICD-10-CM | POA: Diagnosis not present

## 2020-09-06 DIAGNOSIS — I480 Paroxysmal atrial fibrillation: Secondary | ICD-10-CM

## 2020-09-06 DIAGNOSIS — Z952 Presence of prosthetic heart valve: Secondary | ICD-10-CM

## 2020-09-06 DIAGNOSIS — I4821 Permanent atrial fibrillation: Secondary | ICD-10-CM | POA: Diagnosis not present

## 2020-09-06 NOTE — Telephone Encounter (Signed)
Fax received mdINR PT/INR self testing service Test date/time 09/06/20 1142 am INR 2.4

## 2020-09-07 NOTE — Telephone Encounter (Signed)
Aware to continue coumadin as is

## 2020-09-07 NOTE — Telephone Encounter (Signed)
Continue coumadin as is °

## 2020-09-08 ENCOUNTER — Other Ambulatory Visit: Payer: Self-pay | Admitting: Family Medicine

## 2020-09-08 DIAGNOSIS — F0281 Dementia in other diseases classified elsewhere with behavioral disturbance: Secondary | ICD-10-CM

## 2020-09-08 DIAGNOSIS — F0150 Vascular dementia without behavioral disturbance: Secondary | ICD-10-CM

## 2020-09-08 DIAGNOSIS — F02818 Dementia in other diseases classified elsewhere, unspecified severity, with other behavioral disturbance: Secondary | ICD-10-CM

## 2020-09-10 ENCOUNTER — Telehealth: Payer: Self-pay

## 2020-09-10 NOTE — Telephone Encounter (Signed)
Son aware and verbalizes understanding per dpr. 

## 2020-09-10 NOTE — Telephone Encounter (Signed)
If the antibiotic did not clear now he is acting more confused I would take him into the emergency department, the concern is he might be getting dehydrated or might be getting a kidney infection and not just a urinary infection.

## 2020-09-10 NOTE — Telephone Encounter (Signed)
Son thinks the doxy did not clear up patients UTI.  Seen Dr. Darlyn Read on 10/5.  There are no openings for patient to come in- please advise

## 2020-09-13 ENCOUNTER — Ambulatory Visit (INDEPENDENT_AMBULATORY_CARE_PROVIDER_SITE_OTHER): Payer: Medicare HMO | Admitting: Family Medicine

## 2020-09-13 ENCOUNTER — Telehealth: Payer: Self-pay | Admitting: *Deleted

## 2020-09-13 ENCOUNTER — Other Ambulatory Visit: Payer: Self-pay

## 2020-09-13 ENCOUNTER — Encounter: Payer: Self-pay | Admitting: Family Medicine

## 2020-09-13 VITALS — BP 123/68 | HR 79 | Temp 97.8°F | Resp 20 | Ht 68.0 in | Wt 125.0 lb

## 2020-09-13 DIAGNOSIS — R410 Disorientation, unspecified: Secondary | ICD-10-CM | POA: Diagnosis not present

## 2020-09-13 DIAGNOSIS — N390 Urinary tract infection, site not specified: Secondary | ICD-10-CM

## 2020-09-13 DIAGNOSIS — I482 Chronic atrial fibrillation, unspecified: Secondary | ICD-10-CM | POA: Diagnosis not present

## 2020-09-13 DIAGNOSIS — H6123 Impacted cerumen, bilateral: Secondary | ICD-10-CM

## 2020-09-13 DIAGNOSIS — I480 Paroxysmal atrial fibrillation: Secondary | ICD-10-CM

## 2020-09-13 DIAGNOSIS — Z952 Presence of prosthetic heart valve: Secondary | ICD-10-CM

## 2020-09-13 LAB — URINALYSIS, COMPLETE
Bilirubin, UA: NEGATIVE
Glucose, UA: NEGATIVE
Ketones, UA: NEGATIVE
Leukocytes,UA: NEGATIVE
Nitrite, UA: NEGATIVE
Protein,UA: NEGATIVE
RBC, UA: NEGATIVE
Specific Gravity, UA: 1.02 (ref 1.005–1.030)
Urobilinogen, Ur: 2 mg/dL — ABNORMAL HIGH (ref 0.2–1.0)
pH, UA: 6.5 (ref 5.0–7.5)

## 2020-09-13 MED ORDER — DOXYCYCLINE HYCLATE 100 MG PO CAPS: 100.0000 mg | ORAL_CAPSULE | Freq: Two times a day (BID) | ORAL | 0 refills | Status: DC

## 2020-09-13 NOTE — Patient Instructions (Signed)
Earwax removal: ° °Debrox drops are available without a prescription at your pharmacy. ° °Lay on your side with the ear up that you want to treat. Place for 5 drops of the Debrox in the ear canal and lay still for 15 minutes. After that time you considered up and allow the excess to run out of the year and catch it with a Kleenex. Repeat this with the other ear if needed. ° °Repeat this process daily for 1 week. By that time the ear should feel less clogged and her hearing should be better, if not, follow up in the office for recheck of the ear. ° °Thanks, °Maybell Misenheimer °

## 2020-09-13 NOTE — Telephone Encounter (Signed)
Reviewed at visit.

## 2020-09-13 NOTE — Progress Notes (Signed)
Subjective:  Patient ID: [name], male    DOB: Oct 12, 1928  Age: 86 y.o. MRN: [id number]  CC: Increased confusion, hallucinations   HPI ABDIEL [name] presents for Several days of confusion.  He has been hallucinating.  Just saying things and people.  They are not scary.  They have not frightened him.  He is also hearing a buzzing intermittently in his ears.  Mostly on the right.  His speech has been less clear as well.  He has had no cough or shortness of breath.  No chest pain or belly pain.  No vomiting or diarrhea.  He denies any dysuria.  However, he has had confusion in the past when he had a UTI.  His [name] who is with him today and gives much of the history, did a home urine test yesterday and that turned out positive although weakly so.  Depression screen [facility] 2/9 08/17/2020 04/20/2020 10/29/2019  Decreased Interest 0 0 0  Down, Depressed, Hopeless 0 0 0  PHQ - 2 Score 0 0 0  Altered sleeping - - -  Tired, decreased energy - - -  Change in appetite - - -  Feeling bad or failure about yourself  - - -  Trouble concentrating - - -  Moving slowly or fidgety/restless - - -  Suicidal thoughts - - -  PHQ-9 Score - - -  Difficult doing work/chores - - -  Some recent data might be hidden    History Eladio has a past medical history of Aortic stenosis, CAD (coronary artery disease), Carotid stenosis, Cataract, CVA (cerebral infarction) (12/2010), DVT (deep venous thrombosis) ([place]), History of pulmonary embolism, History of rib fracture, History of shingles, Hyperlipidemia, Hypothyroidism, Pacemaker  Biotronik, Personal history of subdural hematoma, S/P cholecystectomy (2005), S/P shoulder surgery, Stroke ([place]) (2012), and Syncope.   He has a past surgical history that includes Aortic valve replacement (1985); Cardiac catheterization; Vascular surgery; Brain surgery; permanent pacemaker insertion (N/A, 08/01/2012); and Eye surgery.   His family history includes Arthritis in his  sister; Cancer in his father; Coronary artery disease in an other family member; Heart attack in his brother, brother, and father; Heart disease in his brother and father; Stroke in his mother.He reports that he has never smoked. He has never used smokeless tobacco. He reports that he does not drink alcohol and does not use drugs.    ROS Review of Systems  Constitutional: Negative for fever.  HENT: Positive for tinnitus.   Respiratory: Negative for shortness of breath.   Cardiovascular: Negative for chest pain.  Musculoskeletal: Negative for arthralgias.  Skin: Negative for rash.    Objective:  BP 123/68   Pulse 79   Temp 97.8 F (36.6 C) (Temporal)   Resp 20   Ht 5' 8" (1.727 m)   Wt 125 lb (56.7 kg)   SpO2 99%   BMI 19.01 kg/m   BP Readings from Last 3 Encounters:  09/13/20 123/68  08/17/20 97/63  04/20/20 117/64    Wt Readings from Last 3 Encounters:  09/13/20 125 lb (56.7 kg)  08/17/20 124 lb (56.2 kg)  04/20/20 130 lb (59 kg)     Physical Exam Vitals reviewed.  Constitutional:      Appearance: He is well-developed.  HENT:     Head: Normocephalic and atraumatic.     Right Ear: External ear normal. No decreased hearing noted.     Left Ear: External ear normal. No decreased hearing noted.  Ears:     Comments: Both TMS occluded with cerumen    Mouth/Throat:     Pharynx: No oropharyngeal exudate or posterior oropharyngeal erythema.  Eyes:     Pupils: Pupils are equal, round, and reactive to light.  Cardiovascular:     Rate and Rhythm: Normal rate and regular rhythm.     Heart sounds: No murmur heard.   Pulmonary:     Effort: No respiratory distress.     Breath sounds: Normal breath sounds.  Abdominal:     General: Bowel sounds are normal.     Palpations: Abdomen is soft. There is no mass.     Tenderness: There is no abdominal tenderness.  Musculoskeletal:     Cervical back: Normal range of motion and neck supple.    INR today is  2.1   Assessment & Plan:   Jarold was seen today for increased confusion, hallucinations.  Diagnoses and all orders for this visit:  Confusion -     CBC with Differential/Platelet -     CMP14+EGFR -     Urinalysis, Complete -     Urine Culture  Urinary tract infection without hematuria, site unspecified -     CBC with Differential/Platelet -     CMP14+EGFR -     Urinalysis, Complete -     Urine Culture  Bilateral impacted cerumen  Chronic atrial fibrillation (HCC)  Other orders -     Cancel: Urinalysis, Complete -     Cancel: Urine Culture -     doxycycline (VIBRAMYCIN) 100 MG capsule; Take 1 capsule (100 mg total) by mouth 2 (two) times daily.       I have discontinued Mikeal Hawthorne A. Maragh's doxycycline. I am also having him start on doxycycline. Additionally, I am having him maintain his multivitamin, docusate sodium, latanoprost, dorzolamide, brimonidine-timolol, potassium chloride, levothyroxine, tamsulosin, warfarin, warfarin, pantoprazole, and donepezil.  Allergies as of 09/13/2020      Reactions   Ivp Dye [iodinated Diagnostic Agents] Swelling   Swelling   Nabumetone Hives   Codeine Nausea Only   Nausea   Sulfonamide Derivatives Hives, Itching   Hives itching      Medication List       Accurate as of September 13, 2020  4:10 PM. If you have any questions, ask your nurse or doctor.        Combigan 0.2-0.5 % ophthalmic solution Generic drug: brimonidine-timolol Place 1 drop into the right eye 2 (two) times daily.   docusate sodium 100 MG capsule Commonly known as: COLACE Take 100 mg by mouth daily.   donepezil 10 MG tablet Commonly known as: ARICEPT TAKE 1 TABLET BY MOUTH EVERYDAY AT BEDTIME   dorzolamide 2 % ophthalmic solution Commonly known as: TRUSOPT Place 1 drop into the right eye 2 (two) times daily.   doxycycline 100 MG capsule Commonly known as: Vibramycin Take 1 capsule (100 mg total) by mouth 2 (two) times daily.   latanoprost  0.005 % ophthalmic solution Commonly known as: XALATAN Place 1 drop into both eyes at bedtime.   levothyroxine 88 MCG tablet Commonly known as: SYNTHROID TAKE 1 TABLET EVERY DAY   multivitamin tablet Take 1 tablet by mouth daily.   pantoprazole 40 MG tablet Commonly known as: PROTONIX TAKE 1 TABLET EVERY DAY   potassium chloride 10 MEQ tablet Commonly known as: KLOR-CON Take 10 mEq by mouth daily as needed.   tamsulosin 0.4 MG Caps capsule Commonly known as: FLOMAX TAKE 1 CAPSULE DAILY AFTER BREAKFAST  warfarin 3 MG tablet Commonly known as: COUMADIN Take as directed by the anticoagulation clinic. If you are unsure how to take this medication, talk to your nurse or doctor. Original instructions: TAKE 1 EVERY OTHER DAY ALTERNATING WITH 2 MG ON OPPOSITE DAYS   warfarin 2 MG tablet Commonly known as: COUMADIN Take as directed by the anticoagulation clinic. If you are unsure how to take this medication, talk to your nurse or doctor. Original instructions: TAKE 1 EVERY OTHER DAY ALTERNATING WITH 3 MG ON OPPOSITE DAYS      Continue coumadin as is. Earwax removal:  Debrox drops are available without a prescription at your pharmacy.  Lay on your side with the ear up that you want to treat. Place for 5 drops of the Debrox in the ear canal and lay still for 15 minutes. After that time you considered up and allow the excess to run out of the year and catch it with a Kleenex. Repeat this with the other ear if needed.  Repeat this process daily for 1 week. By that time the ear should feel less clogged and her hearing should be better, if not, follow up in the office for recheck of the ear.  Thanks, Claretta Fraise  Follow-up: Return in about 1 week (around 09/20/2020), or if symptoms worsen or fail to improve.  Claretta Fraise, M.D.

## 2020-09-13 NOTE — Telephone Encounter (Signed)
Fax received mdINR PT/INR self testing service Test date/time 09/13/20 1233 pm INR 2.1

## 2020-09-14 LAB — CMP14+EGFR
ALT: 7 IU/L (ref 0–44)
AST: 20 IU/L (ref 0–40)
Albumin/Globulin Ratio: 1.2 (ref 1.2–2.2)
Albumin: 3.7 g/dL (ref 3.5–4.6)
Alkaline Phosphatase: 82 IU/L (ref 44–121)
BUN/Creatinine Ratio: 10 (ref 10–24)
BUN: 11 mg/dL (ref 10–36)
Bilirubin Total: 0.6 mg/dL (ref 0.0–1.2)
CO2: 26 mmol/L (ref 20–29)
Calcium: 8.8 mg/dL (ref 8.6–10.2)
Chloride: 103 mmol/L (ref 96–106)
Creatinine, Ser: 1.06 mg/dL (ref 0.76–1.27)
GFR calc Af Amer: 71 mL/min/{1.73_m2} (ref >59)
GFR calc non Af Amer: 61 mL/min/{1.73_m2} (ref >59)
Globulin, Total: 3.2 g/dL (ref 1.5–4.5)
Glucose: 104 mg/dL — ABNORMAL HIGH (ref 65–99)
Potassium: 4 mmol/L (ref 3.5–5.2)
Sodium: 140 mmol/L (ref 134–144)
Total Protein: 6.9 g/dL (ref 6.0–8.5)

## 2020-09-14 LAB — CBC WITH DIFFERENTIAL/PLATELET
Basophils Absolute: 0 10*3/uL (ref 0.0–0.2)
Basos: 1 %
EOS (ABSOLUTE): 0.1 10*3/uL (ref 0.0–0.4)
Eos: 5 %
Hematocrit: 33.8 % — ABNORMAL LOW (ref 37.5–51.0)
Hemoglobin: 11.4 g/dL — ABNORMAL LOW (ref 13.0–17.7)
Immature Grans (Abs): 0 10*3/uL (ref 0.0–0.1)
Immature Granulocytes: 0 %
Lymphocytes Absolute: 0.7 10*3/uL (ref 0.7–3.1)
Lymphs: 25 %
MCH: 33.6 pg — ABNORMAL HIGH (ref 26.6–33.0)
MCHC: 33.7 g/dL (ref 31.5–35.7)
MCV: 100 fL — ABNORMAL HIGH (ref 79–97)
Monocytes Absolute: 0.4 10*3/uL (ref 0.1–0.9)
Monocytes: 12 %
Neutrophils Absolute: 1.7 10*3/uL (ref 1.4–7.0)
Neutrophils: 57 %
Platelets: 133 10*3/uL — ABNORMAL LOW (ref 150–450)
RBC: 3.39 x10E6/uL — ABNORMAL LOW (ref 4.14–5.80)
RDW: 12.4 % (ref 11.6–15.4)
WBC: 3 10*3/uL — ABNORMAL LOW (ref 3.4–10.8)

## 2020-09-14 LAB — URINE CULTURE

## 2020-09-14 NOTE — Progress Notes (Signed)
Hello Sunny,  Your lab result is normal and/or stable.Some minor variations that are not significant are commonly marked abnormal, but do not represent any medical problem for you.  Best regards, Jann Milkovich, M.D.

## 2020-09-20 ENCOUNTER — Telehealth: Payer: Self-pay | Admitting: *Deleted

## 2020-09-20 DIAGNOSIS — I480 Paroxysmal atrial fibrillation: Secondary | ICD-10-CM

## 2020-09-20 DIAGNOSIS — Z952 Presence of prosthetic heart valve: Secondary | ICD-10-CM

## 2020-09-20 NOTE — Telephone Encounter (Signed)
Fax received mdINR PT/INR self testing service Test date/time 09/20/20 1103 am INR 2.0

## 2020-09-20 NOTE — Telephone Encounter (Signed)
Continue coumadin as is °

## 2020-09-21 NOTE — Telephone Encounter (Signed)
Son aware and verbalizes understanding per dpr. 

## 2020-09-27 ENCOUNTER — Telehealth: Payer: Self-pay | Admitting: *Deleted

## 2020-09-27 DIAGNOSIS — Z952 Presence of prosthetic heart valve: Secondary | ICD-10-CM

## 2020-09-27 DIAGNOSIS — I480 Paroxysmal atrial fibrillation: Secondary | ICD-10-CM

## 2020-09-27 NOTE — Telephone Encounter (Signed)
[name] aware to keep coumadin as is

## 2020-09-27 NOTE — Telephone Encounter (Signed)
Continue coumadin as is °

## 2020-09-27 NOTE — Telephone Encounter (Signed)
Fax received mdINR PT/INR self testing service Test date/time 09/27/20 1148 am INR 2.4

## 2020-09-27 NOTE — Telephone Encounter (Signed)
Lmtcb.

## 2020-09-29 DIAGNOSIS — Z961 Presence of intraocular lens: Secondary | ICD-10-CM | POA: Diagnosis not present

## 2020-09-29 DIAGNOSIS — H401113 Primary open-angle glaucoma, right eye, severe stage: Secondary | ICD-10-CM | POA: Diagnosis not present

## 2020-09-29 DIAGNOSIS — H401121 Primary open-angle glaucoma, left eye, mild stage: Secondary | ICD-10-CM | POA: Diagnosis not present

## 2020-09-29 DIAGNOSIS — H43813 Vitreous degeneration, bilateral: Secondary | ICD-10-CM | POA: Diagnosis not present

## 2020-09-30 DIAGNOSIS — H35373 Puckering of macula, bilateral: Secondary | ICD-10-CM | POA: Diagnosis not present

## 2020-09-30 DIAGNOSIS — H2701 Aphakia, right eye: Secondary | ICD-10-CM | POA: Diagnosis not present

## 2020-09-30 DIAGNOSIS — H348112 Central retinal vein occlusion, right eye, stable: Secondary | ICD-10-CM | POA: Diagnosis not present

## 2020-09-30 DIAGNOSIS — T8521XA Breakdown (mechanical) of intraocular lens, initial encounter: Secondary | ICD-10-CM | POA: Diagnosis not present

## 2020-10-04 ENCOUNTER — Telehealth: Payer: Self-pay | Admitting: *Deleted

## 2020-10-04 DIAGNOSIS — I480 Paroxysmal atrial fibrillation: Secondary | ICD-10-CM

## 2020-10-04 DIAGNOSIS — I4821 Permanent atrial fibrillation: Secondary | ICD-10-CM | POA: Diagnosis not present

## 2020-10-04 DIAGNOSIS — Z7901 Long term (current) use of anticoagulants: Secondary | ICD-10-CM | POA: Diagnosis not present

## 2020-10-04 DIAGNOSIS — Z952 Presence of prosthetic heart valve: Secondary | ICD-10-CM

## 2020-10-04 LAB — POCT INR: INR: 2.3 (ref 2.0–3.0)

## 2020-10-04 NOTE — Telephone Encounter (Signed)
Son aware and verbalizes understanding.  

## 2020-10-04 NOTE — Telephone Encounter (Signed)
Fax received mdINR PT/INR self testing service Test date/time 10/04/20 1127 am INR 2.3

## 2020-10-04 NOTE — Telephone Encounter (Signed)
Continue coumadin as is °

## 2020-10-11 ENCOUNTER — Telehealth: Payer: Self-pay | Admitting: *Deleted

## 2020-10-11 DIAGNOSIS — Z952 Presence of prosthetic heart valve: Secondary | ICD-10-CM

## 2020-10-11 DIAGNOSIS — I480 Paroxysmal atrial fibrillation: Secondary | ICD-10-CM

## 2020-10-11 LAB — POCT INR: INR: 2.5 (ref 2.0–3.0)

## 2020-10-11 NOTE — Telephone Encounter (Signed)
Continue coumadin as is °

## 2020-10-11 NOTE — Telephone Encounter (Signed)
Fax received mdINR PT/INR self testing service Test date/time 10/11/20 1159 am INR 2.5

## 2020-10-11 NOTE — Telephone Encounter (Signed)
Patient aware and verbalized understanding. °

## 2020-10-18 ENCOUNTER — Telehealth: Payer: Self-pay | Admitting: *Deleted

## 2020-10-18 DIAGNOSIS — I480 Paroxysmal atrial fibrillation: Secondary | ICD-10-CM

## 2020-10-18 DIAGNOSIS — Z952 Presence of prosthetic heart valve: Secondary | ICD-10-CM

## 2020-10-18 NOTE — Telephone Encounter (Signed)
Coumadin 4mg  daily except 6mg  on tues and thurs

## 2020-10-18 NOTE — Telephone Encounter (Signed)
Fax received mdINR PT/INR self testing service Test date/time 10/18/20 1134 am INR 2.0

## 2020-10-18 NOTE — Telephone Encounter (Signed)
Son aware and verbalizes understanding.  

## 2020-10-25 ENCOUNTER — Telehealth: Payer: Self-pay | Admitting: *Deleted

## 2020-10-25 DIAGNOSIS — Z952 Presence of prosthetic heart valve: Secondary | ICD-10-CM

## 2020-10-25 DIAGNOSIS — I480 Paroxysmal atrial fibrillation: Secondary | ICD-10-CM

## 2020-10-25 NOTE — Telephone Encounter (Signed)
Son aware and verbalizes understanding per dpr. 

## 2020-10-25 NOTE — Telephone Encounter (Signed)
Continue coumadin as is °

## 2020-10-25 NOTE — Telephone Encounter (Signed)
Fax received mdINR PT/INR self testing service Test date/time 10/25/20 1034 am INR 2.1

## 2020-11-01 ENCOUNTER — Telehealth: Payer: Self-pay | Admitting: *Deleted

## 2020-11-01 DIAGNOSIS — I4821 Permanent atrial fibrillation: Secondary | ICD-10-CM | POA: Diagnosis not present

## 2020-11-01 DIAGNOSIS — I480 Paroxysmal atrial fibrillation: Secondary | ICD-10-CM

## 2020-11-01 DIAGNOSIS — Z952 Presence of prosthetic heart valve: Secondary | ICD-10-CM

## 2020-11-01 DIAGNOSIS — Z7901 Long term (current) use of anticoagulants: Secondary | ICD-10-CM | POA: Diagnosis not present

## 2020-11-01 LAB — POCT INR: INR: 2.1 (ref 2–3)

## 2020-11-01 NOTE — Telephone Encounter (Signed)
Son aware and verbalizes understanding.  

## 2020-11-01 NOTE — Telephone Encounter (Signed)
Description   INR 2.1. Goal for A. Fib with mechanical valve replacement is 2.5-3.5.   Dose change today.   Regimen: Coumadin 4mg  daily except 6mg  on tues, thurs, and saturdays. Recheck in 1 week.

## 2020-11-01 NOTE — Telephone Encounter (Signed)
Fax received mdINR PT/INR self testing service Test date/time 11/01/20 948 am INR 2.1

## 2020-11-01 NOTE — Telephone Encounter (Signed)
There are not notes on what patient needs to do. Please advise

## 2020-11-08 ENCOUNTER — Telehealth: Payer: Self-pay | Admitting: *Deleted

## 2020-11-08 DIAGNOSIS — I480 Paroxysmal atrial fibrillation: Secondary | ICD-10-CM

## 2020-11-08 DIAGNOSIS — Z952 Presence of prosthetic heart valve: Secondary | ICD-10-CM

## 2020-11-08 NOTE — Telephone Encounter (Signed)
Son aware and verbalizes understanding.  

## 2020-11-08 NOTE — Telephone Encounter (Signed)
Continue coumadin as is °

## 2020-11-08 NOTE — Telephone Encounter (Signed)
Fax received mdINR PT/INR self testing service Test date/time 11/08/20 1021 am INR 2.0

## 2020-11-15 ENCOUNTER — Telehealth: Payer: Self-pay | Admitting: *Deleted

## 2020-11-15 NOTE — Telephone Encounter (Signed)
Fax received mdINR PT/INR self testing service Test date/time 11/15/20 at 11Am INR 1.8  Please advise and call son at [phone]

## 2020-11-15 NOTE — Telephone Encounter (Signed)
Continue Coumadin as is

## 2020-11-16 NOTE — Telephone Encounter (Signed)
Pt son [name] aware of provider feedback and voiced understanding.

## 2020-11-23 ENCOUNTER — Telehealth: Payer: Self-pay | Admitting: *Deleted

## 2020-11-23 DIAGNOSIS — Z952 Presence of prosthetic heart valve: Secondary | ICD-10-CM

## 2020-11-23 DIAGNOSIS — I480 Paroxysmal atrial fibrillation: Secondary | ICD-10-CM

## 2020-11-23 NOTE — Telephone Encounter (Signed)
Fax received mdINR PT/INR self testing service Test date/time 11/22/20 1031 am INR 2.5

## 2020-11-23 NOTE — Telephone Encounter (Signed)
A little thin. Eat more vitamin K foods this week - primarily green leafy vegetables

## 2020-11-23 NOTE — Telephone Encounter (Signed)
Son aware and verbalizes understanding per dpr. 

## 2020-11-29 ENCOUNTER — Telehealth: Payer: Self-pay | Admitting: *Deleted

## 2020-11-29 DIAGNOSIS — I480 Paroxysmal atrial fibrillation: Secondary | ICD-10-CM

## 2020-11-29 DIAGNOSIS — Z7901 Long term (current) use of anticoagulants: Secondary | ICD-10-CM | POA: Diagnosis not present

## 2020-11-29 DIAGNOSIS — I4821 Permanent atrial fibrillation: Secondary | ICD-10-CM | POA: Diagnosis not present

## 2020-11-29 DIAGNOSIS — Z952 Presence of prosthetic heart valve: Secondary | ICD-10-CM

## 2020-11-29 NOTE — Telephone Encounter (Signed)
Fax received mdINR PT/INR self testing service Test date/time 11/29/20 1125 am INR 2.5

## 2020-11-29 NOTE — Telephone Encounter (Signed)
Patients son aware 

## 2020-11-29 NOTE — Telephone Encounter (Signed)
Continue coumadin as is °

## 2020-12-05 ENCOUNTER — Other Ambulatory Visit: Payer: Self-pay | Admitting: Family Medicine

## 2020-12-05 DIAGNOSIS — F0150 Vascular dementia without behavioral disturbance: Secondary | ICD-10-CM

## 2020-12-05 DIAGNOSIS — G301 Alzheimer's disease with late onset: Secondary | ICD-10-CM

## 2020-12-05 DIAGNOSIS — F0281 Dementia in other diseases classified elsewhere with behavioral disturbance: Secondary | ICD-10-CM

## 2020-12-06 ENCOUNTER — Telehealth: Payer: Self-pay | Admitting: *Deleted

## 2020-12-06 DIAGNOSIS — I480 Paroxysmal atrial fibrillation: Secondary | ICD-10-CM

## 2020-12-06 DIAGNOSIS — Z952 Presence of prosthetic heart valve: Secondary | ICD-10-CM

## 2020-12-06 NOTE — Telephone Encounter (Signed)
Continue coumadin as is °

## 2020-12-06 NOTE — Telephone Encounter (Signed)
Fax received mdINR PT/INR self testing service Test date/time 12/06/20 939 am INR 2.0

## 2020-12-07 NOTE — Telephone Encounter (Signed)
Patients son aware 

## 2020-12-13 ENCOUNTER — Telehealth: Payer: Self-pay | Admitting: *Deleted

## 2020-12-13 DIAGNOSIS — I480 Paroxysmal atrial fibrillation: Secondary | ICD-10-CM

## 2020-12-13 DIAGNOSIS — Z952 Presence of prosthetic heart valve: Secondary | ICD-10-CM

## 2020-12-13 NOTE — Telephone Encounter (Signed)
Son aware and verbalizes understanding per dpr. 

## 2020-12-13 NOTE — Telephone Encounter (Signed)
Fax received mdINR PT/INR self testing service Test date/time 12/13/20 947 am INR 2.5

## 2020-12-13 NOTE — Telephone Encounter (Signed)
Continue coumadin as is °

## 2020-12-20 ENCOUNTER — Telehealth: Payer: Self-pay | Admitting: *Deleted

## 2020-12-20 DIAGNOSIS — I480 Paroxysmal atrial fibrillation: Secondary | ICD-10-CM

## 2020-12-20 DIAGNOSIS — Z952 Presence of prosthetic heart valve: Secondary | ICD-10-CM

## 2020-12-20 NOTE — Telephone Encounter (Signed)
Continue coumadin as is °

## 2020-12-20 NOTE — Telephone Encounter (Signed)
Fax received mdINR PT/INR self testing service Test date/time 12/20/20 1207 pm INR 2.4

## 2020-12-20 NOTE — Telephone Encounter (Signed)
Son aware and verbalizes understanding.  

## 2020-12-21 ENCOUNTER — Ambulatory Visit (INDEPENDENT_AMBULATORY_CARE_PROVIDER_SITE_OTHER): Payer: Medicare HMO | Admitting: Family Medicine

## 2020-12-21 DIAGNOSIS — Z7901 Long term (current) use of anticoagulants: Secondary | ICD-10-CM

## 2020-12-21 DIAGNOSIS — Z952 Presence of prosthetic heart valve: Secondary | ICD-10-CM | POA: Diagnosis not present

## 2020-12-21 DIAGNOSIS — I482 Chronic atrial fibrillation, unspecified: Secondary | ICD-10-CM

## 2020-12-21 DIAGNOSIS — F0150 Vascular dementia without behavioral disturbance: Secondary | ICD-10-CM | POA: Diagnosis not present

## 2020-12-21 NOTE — Progress Notes (Unsigned)
Subjective:    Patient ID: [name], male    DOB: 1928-02-11, 87 y.o.   MRN: [id number]   HPI: [name] is a 87 y.o. male presenting for good and bad days. Gets upset, ill and caregivers coming in are getting frustrated. He is more and more forgetful. Forgot his son last night. Asked [name] if he had hard from [name] lately. Concerned that Mr. Weidinger needs Memory care. Considering placement at a facility.  At that [facility] just he just can have to be honest with you so for you feel like you need to do and what you are willing to do and what should capable of them he just does need to sit down with the your wife and make some decisions together   Depression screen [facility] 2/9 08/17/2020 04/20/2020 10/29/2019 01/24/2019 11/18/2018  Decreased Interest 0 0 0 0 0  Down, Depressed, Hopeless 0 0 0 0 0  PHQ - 2 Score 0 0 0 0 0  Altered sleeping - - - - 0  Tired, decreased energy - - - - 0  Change in appetite - - - - 0  Feeling bad or failure about yourself  - - - - 0  Trouble concentrating - - - - 0  Moving slowly or fidgety/restless - - - - 0  Suicidal thoughts - - - - 0  PHQ-9 Score - - - - 0  Difficult doing work/chores - - - - Not difficult at all  Some recent data might be hidden     Relevant past medical, surgical, family and social history reviewed and updated as indicated.  Interim medical history since our last visit reviewed. Allergies and medications reviewed and updated.  ROS:  Review of Systems  Constitutional: Positive for activity change and fatigue. Negative for appetite change and fever.  HENT: Negative.   Psychiatric/Behavioral: Positive for agitation (intermittently), confusion and decreased concentration. Negative for hallucinations. The patient is nervous/anxious.      Social History   Tobacco Use  Smoking Status Never Smoker  Smokeless Tobacco Never Used       Objective:     Wt Readings from Last 3 Encounters:  09/13/20 125 lb (56.7 kg)   08/17/20 124 lb (56.2 kg)  04/20/20 130 lb (59 kg)     Exam deferred. Pt. Harboring due to COVID 19. Phone visit performed.   Assessment & Plan:   1. 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 Chronic atrial fibrillation (HCC)   3. Aortic valve replacement   4. Long term current use of anticoagulant     No orders of the defined types were placed in this encounter.   No orders of the defined types were placed in this encounter.     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  The family may not be able to care for much longer.  We discussed options with regard to placement and nursing homes and strategies for finding the right 1 for him.  He will definitely need a memory care unit.  They will need to have the capability of helping to monitor his Coumadin or be in a location where his [name], can go to the facility to check his INR intermittently.  Virtual Visit  via telephone Note  I discussed the limitations, risks, security and privacy concerns of performing an evaluation and management service by telephone and the availability of in person appointments. The patient was identified with two identifiers. Pt.expressed understanding and agreed to proceed. Pt. Is at home. Dr. Darlyn Read is in his office.  Follow Up Instructions:   I discussed the assessment and treatment plan with the patient. The patient was provided an opportunity to ask questions and all were answered. The patient agreed with the plan and demonstrated an understanding of the instructions.   The patient was advised to call back or seek an in-person evaluation if the symptoms worsen or if the condition fails to improve as  anticipated.   Total minutes including chart review and phone contact time: 21   Follow up plan: Return if symptoms worsen or fail to improve.  [name], MD [place] [facility]

## 2020-12-22 ENCOUNTER — Encounter: Payer: Self-pay | Admitting: Family Medicine

## 2020-12-27 ENCOUNTER — Telehealth: Payer: Self-pay | Admitting: *Deleted

## 2020-12-27 DIAGNOSIS — I4821 Permanent atrial fibrillation: Secondary | ICD-10-CM | POA: Diagnosis not present

## 2020-12-27 DIAGNOSIS — I480 Paroxysmal atrial fibrillation: Secondary | ICD-10-CM

## 2020-12-27 DIAGNOSIS — Z7901 Long term (current) use of anticoagulants: Secondary | ICD-10-CM | POA: Diagnosis not present

## 2020-12-27 DIAGNOSIS — Z952 Presence of prosthetic heart valve: Secondary | ICD-10-CM

## 2020-12-27 NOTE — Telephone Encounter (Signed)
[name] notified and verbalized understanding

## 2020-12-27 NOTE — Telephone Encounter (Signed)
Continue coumadin 4mg  daily except 6mg  on thurs and sat

## 2020-12-27 NOTE — Telephone Encounter (Signed)
Fax received mdINR PT/INR self testing service Test date/time 12/27/20 1107 am INR 2.7

## 2020-12-30 ENCOUNTER — Ambulatory Visit: Payer: Medicare HMO | Admitting: Family Medicine

## 2021-01-03 ENCOUNTER — Telehealth: Payer: Self-pay | Admitting: *Deleted

## 2021-01-03 DIAGNOSIS — I480 Paroxysmal atrial fibrillation: Secondary | ICD-10-CM

## 2021-01-03 DIAGNOSIS — Z952 Presence of prosthetic heart valve: Secondary | ICD-10-CM

## 2021-01-03 NOTE — Telephone Encounter (Signed)
Continue coumadin as is °

## 2021-01-03 NOTE — Telephone Encounter (Signed)
Fax received mdINR PT/INR self testing service Test date/time 01/03/21 1105 am INR 2.3

## 2021-01-04 NOTE — Telephone Encounter (Signed)
Patient aware and verbalized understanding. °

## 2021-01-10 ENCOUNTER — Telehealth: Payer: Self-pay

## 2021-01-10 NOTE — Telephone Encounter (Signed)
Continue coumadin as is °

## 2021-01-11 NOTE — Telephone Encounter (Signed)
Patients son notified and verbalized understanding.

## 2021-01-17 ENCOUNTER — Telehealth: Payer: Self-pay | Admitting: *Deleted

## 2021-01-17 NOTE — Telephone Encounter (Signed)
Fax received mdINR PT/INR self testing service Test date/time 01/10/21 11/25 am INR 2.5   clsoing encounter, was addressed by a call from pt's son on day test was done, fax not received until today

## 2021-01-17 NOTE — Telephone Encounter (Signed)
Patient aware.

## 2021-01-17 NOTE — Telephone Encounter (Signed)
Continue coumadin as is °

## 2021-01-17 NOTE — Telephone Encounter (Signed)
Fax received mdINR PT/INR self testing service Test date/time 01/17/21 1124 am INR 2.2

## 2021-01-21 ENCOUNTER — Other Ambulatory Visit: Payer: Self-pay | Admitting: Family Medicine

## 2021-01-21 DIAGNOSIS — N400 Enlarged prostate without lower urinary tract symptoms: Secondary | ICD-10-CM

## 2021-01-24 ENCOUNTER — Telehealth: Payer: Self-pay | Admitting: *Deleted

## 2021-01-24 DIAGNOSIS — I4821 Permanent atrial fibrillation: Secondary | ICD-10-CM | POA: Diagnosis not present

## 2021-01-24 DIAGNOSIS — I480 Paroxysmal atrial fibrillation: Secondary | ICD-10-CM

## 2021-01-24 DIAGNOSIS — Z7901 Long term (current) use of anticoagulants: Secondary | ICD-10-CM | POA: Diagnosis not present

## 2021-01-24 DIAGNOSIS — Z952 Presence of prosthetic heart valve: Secondary | ICD-10-CM

## 2021-01-24 NOTE — Telephone Encounter (Signed)
Continue coumadin as is Eat some extra greens this week

## 2021-01-24 NOTE — Telephone Encounter (Signed)
Patient aware.

## 2021-01-24 NOTE — Telephone Encounter (Signed)
Fax received mdINR PT/INR self testing service Test date/time 01/24/21 1214 pm INR 2.5

## 2021-01-31 ENCOUNTER — Telehealth: Payer: Self-pay | Admitting: *Deleted

## 2021-01-31 DIAGNOSIS — Z952 Presence of prosthetic heart valve: Secondary | ICD-10-CM

## 2021-01-31 DIAGNOSIS — I480 Paroxysmal atrial fibrillation: Secondary | ICD-10-CM

## 2021-01-31 NOTE — Telephone Encounter (Signed)
Patient aware and verbalized understanding. °

## 2021-01-31 NOTE — Telephone Encounter (Signed)
Continue coumadin as is °

## 2021-01-31 NOTE — Telephone Encounter (Signed)
Fax received mdINR PT/INR self testing service Test date/time 01/31/21 1016 am INR 2.2

## 2021-02-03 ENCOUNTER — Telehealth: Payer: Self-pay

## 2021-02-03 NOTE — Telephone Encounter (Signed)
Received a call report from the access nurse.   Son called 02/02/21 at 6:37pm stating that patient had vomiting and diarrhea. Called states that he may call in an ambulance to transport patient but he wanted to talk to nurse with recommendation with patient and caretaker first before making the decision. Was advise to Ed now or PCP.   I called son today and he states that patient is better and his other brother had a stomach virus so they are just monitoring patient. Aware if patient gets worse to seek medical antitension. Verbalizes understanding.

## 2021-02-07 ENCOUNTER — Telehealth: Payer: Self-pay | Admitting: *Deleted

## 2021-02-07 DIAGNOSIS — I480 Paroxysmal atrial fibrillation: Secondary | ICD-10-CM

## 2021-02-07 DIAGNOSIS — Z952 Presence of prosthetic heart valve: Secondary | ICD-10-CM

## 2021-02-07 NOTE — Telephone Encounter (Signed)
Hold the coumadin 2 days and recheck INR on Wednesday.

## 2021-02-07 NOTE — Telephone Encounter (Signed)
[name] aware

## 2021-02-07 NOTE — Telephone Encounter (Signed)
Fax received mdINR PT/INR self testing service Test date/time 02/07/21 1057 am INR 3.7

## 2021-02-09 ENCOUNTER — Telehealth: Payer: Self-pay | Admitting: *Deleted

## 2021-02-09 DIAGNOSIS — Z952 Presence of prosthetic heart valve: Secondary | ICD-10-CM

## 2021-02-09 DIAGNOSIS — I480 Paroxysmal atrial fibrillation: Secondary | ICD-10-CM

## 2021-02-09 NOTE — Telephone Encounter (Signed)
Resume previous dose of coumadin. Recheck INR on 4/4

## 2021-02-09 NOTE — Telephone Encounter (Signed)
Fax received mdINR PT/INR self testing service Test date/time 02/09/21 1058 am INR 2.1

## 2021-02-09 NOTE — Telephone Encounter (Signed)
Left message informing of results and to continue current dose of Coumadin.

## 2021-02-14 ENCOUNTER — Telehealth: Payer: Self-pay

## 2021-02-14 NOTE — Telephone Encounter (Signed)
Son aware to continue medication as is

## 2021-02-14 NOTE — Telephone Encounter (Signed)
Continue coumadin as is °

## 2021-02-14 NOTE — Telephone Encounter (Signed)
Fax received mdINR PT/INR self testing service Test date/time 02/14/2021 10:09 am INR 2.1

## 2021-02-21 ENCOUNTER — Telehealth: Payer: Self-pay

## 2021-02-21 DIAGNOSIS — I4821 Permanent atrial fibrillation: Secondary | ICD-10-CM | POA: Diagnosis not present

## 2021-02-21 DIAGNOSIS — Z7901 Long term (current) use of anticoagulants: Secondary | ICD-10-CM | POA: Diagnosis not present

## 2021-02-21 NOTE — Telephone Encounter (Signed)
Fax received mdINR PT/INR self testing service Test date/time 02/21/21 10:36 am INR 2.6

## 2021-02-21 NOTE — Telephone Encounter (Signed)
[name] aware to continue current coumadin

## 2021-02-21 NOTE — Telephone Encounter (Signed)
INR therapeutic. Continue current regimen. Recheck in 2 weeks 

## 2021-02-28 ENCOUNTER — Telehealth: Payer: Self-pay

## 2021-02-28 NOTE — Telephone Encounter (Signed)
Son aware.

## 2021-02-28 NOTE — Telephone Encounter (Signed)
Continue coumadin as is °

## 2021-02-28 NOTE — Telephone Encounter (Signed)
Fax received mdINR PT/INR self testing service Test date/time 02/28/21 11:03 am INR 2.5

## 2021-03-03 ENCOUNTER — Other Ambulatory Visit: Payer: Self-pay | Admitting: Family Medicine

## 2021-03-03 DIAGNOSIS — F0281 Dementia in other diseases classified elsewhere with behavioral disturbance: Secondary | ICD-10-CM

## 2021-03-03 DIAGNOSIS — F02818 Dementia in other diseases classified elsewhere, unspecified severity, with other behavioral disturbance: Secondary | ICD-10-CM

## 2021-03-03 DIAGNOSIS — F0150 Vascular dementia without behavioral disturbance: Secondary | ICD-10-CM

## 2021-03-07 ENCOUNTER — Telehealth: Payer: Self-pay | Admitting: *Deleted

## 2021-03-07 DIAGNOSIS — Z952 Presence of prosthetic heart valve: Secondary | ICD-10-CM

## 2021-03-07 DIAGNOSIS — I480 Paroxysmal atrial fibrillation: Secondary | ICD-10-CM

## 2021-03-07 NOTE — Telephone Encounter (Signed)
Continue coumadin as is °

## 2021-03-07 NOTE — Telephone Encounter (Signed)
Patient aware.

## 2021-03-07 NOTE — Telephone Encounter (Signed)
Fax received mdINR PT/INR self testing service Test date/time 03/07/21 1146 am INR 1.7

## 2021-03-14 ENCOUNTER — Telehealth: Payer: Self-pay | Admitting: *Deleted

## 2021-03-14 DIAGNOSIS — I480 Paroxysmal atrial fibrillation: Secondary | ICD-10-CM

## 2021-03-14 DIAGNOSIS — Z952 Presence of prosthetic heart valve: Secondary | ICD-10-CM

## 2021-03-14 LAB — POCT INR: INR: 2.6 (ref 2.0–3.0)

## 2021-03-14 NOTE — Telephone Encounter (Signed)
Change coumadin as follows:  Description     Regimen: Coumadin 4mg  daily

## 2021-03-14 NOTE — Telephone Encounter (Signed)
Fax received mdINR PT/INR self testing service Test date/time 03/14/21 1011 am INR 2.6

## 2021-03-14 NOTE — Telephone Encounter (Signed)
Son aware to change to 4 mg Coumadin daily

## 2021-03-21 ENCOUNTER — Telehealth: Payer: Self-pay | Admitting: *Deleted

## 2021-03-21 DIAGNOSIS — Z952 Presence of prosthetic heart valve: Secondary | ICD-10-CM

## 2021-03-21 DIAGNOSIS — I480 Paroxysmal atrial fibrillation: Secondary | ICD-10-CM

## 2021-03-21 DIAGNOSIS — I4821 Permanent atrial fibrillation: Secondary | ICD-10-CM | POA: Diagnosis not present

## 2021-03-21 DIAGNOSIS — Z7901 Long term (current) use of anticoagulants: Secondary | ICD-10-CM | POA: Diagnosis not present

## 2021-03-21 NOTE — Telephone Encounter (Signed)
Fax received mdINR PT/INR self testing service Test date/time 03/21/21 1139 am INR 2.4

## 2021-03-21 NOTE — Telephone Encounter (Signed)
Called patient, no answer, left message to return call 

## 2021-03-21 NOTE — Telephone Encounter (Signed)
Continue coumadin as is °

## 2021-03-22 NOTE — Telephone Encounter (Signed)
Son aware to continue current coumadin

## 2021-03-28 ENCOUNTER — Telehealth: Payer: Self-pay | Admitting: *Deleted

## 2021-03-28 DIAGNOSIS — Z952 Presence of prosthetic heart valve: Secondary | ICD-10-CM

## 2021-03-28 DIAGNOSIS — I480 Paroxysmal atrial fibrillation: Secondary | ICD-10-CM

## 2021-03-28 NOTE — Telephone Encounter (Signed)
Patient aware.

## 2021-03-28 NOTE — Telephone Encounter (Signed)
Fax received mdINR PT/INR self testing service Test date/time 03/28/21 1046 am INR 1.9

## 2021-03-28 NOTE — Telephone Encounter (Signed)
Continue coumadin as is °

## 2021-04-04 ENCOUNTER — Telehealth: Payer: Self-pay | Admitting: *Deleted

## 2021-04-04 DIAGNOSIS — I480 Paroxysmal atrial fibrillation: Secondary | ICD-10-CM

## 2021-04-04 DIAGNOSIS — Z952 Presence of prosthetic heart valve: Secondary | ICD-10-CM

## 2021-04-04 NOTE — Telephone Encounter (Signed)
Patient aware.

## 2021-04-04 NOTE — Telephone Encounter (Signed)
Fax received mdINR PT/INR self testing service Test date/time 04/04/21 1055 am INR 2.4

## 2021-04-04 NOTE — Telephone Encounter (Signed)
Continue coumadin as is °

## 2021-04-05 ENCOUNTER — Other Ambulatory Visit: Payer: Self-pay | Admitting: Family Medicine

## 2021-04-05 DIAGNOSIS — N400 Enlarged prostate without lower urinary tract symptoms: Secondary | ICD-10-CM

## 2021-04-12 ENCOUNTER — Telehealth: Payer: Self-pay

## 2021-04-12 DIAGNOSIS — I480 Paroxysmal atrial fibrillation: Secondary | ICD-10-CM

## 2021-04-12 DIAGNOSIS — Z952 Presence of prosthetic heart valve: Secondary | ICD-10-CM

## 2021-04-12 NOTE — Telephone Encounter (Signed)
Fax received mdINR PT/INR self testing service Test date/time 04/11/21 11:43 am INR 2.3

## 2021-04-12 NOTE — Telephone Encounter (Signed)
[name] aware and verbalizes understanding.

## 2021-04-12 NOTE — Telephone Encounter (Signed)
INR 2.3 today. Take extra tablet today (6 mg total), then continue 4 mg daily.  Recheck in 1 week.

## 2021-04-18 ENCOUNTER — Telehealth: Payer: Self-pay | Admitting: *Deleted

## 2021-04-18 DIAGNOSIS — I480 Paroxysmal atrial fibrillation: Secondary | ICD-10-CM

## 2021-04-18 DIAGNOSIS — I4821 Permanent atrial fibrillation: Secondary | ICD-10-CM | POA: Diagnosis not present

## 2021-04-18 DIAGNOSIS — Z7901 Long term (current) use of anticoagulants: Secondary | ICD-10-CM | POA: Diagnosis not present

## 2021-04-18 DIAGNOSIS — Z952 Presence of prosthetic heart valve: Secondary | ICD-10-CM

## 2021-04-18 NOTE — Telephone Encounter (Signed)
Patient aware and verbalized understanding. °

## 2021-04-18 NOTE — Telephone Encounter (Signed)
Fax received mdINR PT/INR self testing service Test date/time 04/18/20 1119 am  INR 2.5

## 2021-04-18 NOTE — Telephone Encounter (Signed)
Continue coumadin as is °

## 2021-04-19 ENCOUNTER — Encounter: Payer: Self-pay | Admitting: Family Medicine

## 2021-04-19 ENCOUNTER — Ambulatory Visit: Payer: Medicare HMO | Admitting: Family Medicine

## 2021-04-19 ENCOUNTER — Other Ambulatory Visit: Payer: Self-pay

## 2021-04-19 VITALS — BP 123/70 | HR 86 | Temp 97.8°F | Ht 68.0 in | Wt 125.6 lb

## 2021-04-19 DIAGNOSIS — I480 Paroxysmal atrial fibrillation: Secondary | ICD-10-CM | POA: Diagnosis not present

## 2021-04-19 DIAGNOSIS — F0150 Vascular dementia without behavioral disturbance: Secondary | ICD-10-CM

## 2021-04-19 DIAGNOSIS — N1832 Chronic kidney disease, stage 3b: Secondary | ICD-10-CM | POA: Diagnosis not present

## 2021-04-19 DIAGNOSIS — Z23 Encounter for immunization: Secondary | ICD-10-CM

## 2021-04-19 DIAGNOSIS — Z952 Presence of prosthetic heart valve: Secondary | ICD-10-CM

## 2021-04-19 DIAGNOSIS — I25118 Atherosclerotic heart disease of native coronary artery with other forms of angina pectoris: Secondary | ICD-10-CM | POA: Diagnosis not present

## 2021-04-19 DIAGNOSIS — I482 Chronic atrial fibrillation, unspecified: Secondary | ICD-10-CM

## 2021-04-19 DIAGNOSIS — F419 Anxiety disorder, unspecified: Secondary | ICD-10-CM | POA: Diagnosis not present

## 2021-04-19 DIAGNOSIS — Z111 Encounter for screening for respiratory tuberculosis: Secondary | ICD-10-CM | POA: Diagnosis not present

## 2021-04-19 DIAGNOSIS — Z7901 Long term (current) use of anticoagulants: Secondary | ICD-10-CM

## 2021-04-19 DIAGNOSIS — E039 Hypothyroidism, unspecified: Secondary | ICD-10-CM

## 2021-04-19 MED ORDER — DULOXETINE HCL 20 MG PO CPEP: 20.0000 mg | ORAL_CAPSULE | Freq: Every day | ORAL | 1 refills | Status: DC
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
## 2021-04-20 ENCOUNTER — Encounter: Payer: Self-pay | Admitting: Family Medicine

## 2021-04-20 LAB — CBC WITH DIFFERENTIAL/PLATELET
Basophils Absolute: 0 10*3/uL (ref 0.0–0.2)
Basos: 1 %
EOS (ABSOLUTE): 0.1 10*3/uL (ref 0.0–0.4)
Eos: 3 %
Hematocrit: 34 % — ABNORMAL LOW (ref 37.5–51.0)
Hemoglobin: 11.4 g/dL — ABNORMAL LOW (ref 13.0–17.7)
Immature Grans (Abs): 0 10*3/uL (ref 0.0–0.1)
Immature Granulocytes: 0 %
Lymphocytes Absolute: 0.7 10*3/uL (ref 0.7–3.1)
Lymphs: 26 %
MCH: 33 pg (ref 26.6–33.0)
MCHC: 33.5 g/dL (ref 31.5–35.7)
MCV: 99 fL — ABNORMAL HIGH (ref 79–97)
Monocytes Absolute: 0.3 10*3/uL (ref 0.1–0.9)
Monocytes: 10 %
Neutrophils Absolute: 1.8 10*3/uL (ref 1.4–7.0)
Neutrophils: 60 %
Platelets: 116 10*3/uL — ABNORMAL LOW (ref 150–450)
RBC: 3.45 x10E6/uL — ABNORMAL LOW (ref 4.14–5.80)
RDW: 13.1 % (ref 11.6–15.4)
WBC: 2.9 10*3/uL — ABNORMAL LOW (ref 3.4–10.8)

## 2021-04-20 LAB — CMP14+EGFR
ALT: 6 IU/L (ref 0–44)
AST: 19 IU/L (ref 0–40)
Albumin/Globulin Ratio: 1.3 (ref 1.2–2.2)
Albumin: 3.9 g/dL (ref 3.5–4.6)
Alkaline Phosphatase: 93 IU/L (ref 44–121)
BUN/Creatinine Ratio: 13 (ref 10–24)
BUN: 16 mg/dL (ref 10–36)
Bilirubin Total: 0.7 mg/dL (ref 0.0–1.2)
CO2: 24 mmol/L (ref 20–29)
Calcium: 8.6 mg/dL (ref 8.6–10.2)
Chloride: 105 mmol/L (ref 96–106)
Creatinine, Ser: 1.28 mg/dL — ABNORMAL HIGH (ref 0.76–1.27)
Globulin, Total: 3 g/dL (ref 1.5–4.5)
Glucose: 97 mg/dL (ref 65–99)
Potassium: 4.3 mmol/L (ref 3.5–5.2)
Sodium: 140 mmol/L (ref 134–144)
Total Protein: 6.9 g/dL (ref 6.0–8.5)
eGFR: 53 mL/min/{1.73_m2} — ABNORMAL LOW (ref >59)

## 2021-04-20 LAB — TSH+FREE T4
Free T4: 1.75 ng/dL (ref 0.82–1.77)
TSH: 0.536 u[IU]/mL (ref 0.450–4.500)

## 2021-04-20 NOTE — Progress Notes (Signed)
Hello [name],  Your lab result is normal and/or stable.Some minor variations that are not significant are commonly marked abnormal, but do not represent any medical problem for you.  Best regards, Coila Wardell, M.D.

## 2021-04-21 LAB — QUANTIFERON-TB GOLD PLUS
QuantiFERON Mitogen Value: >10 IU/mL
QuantiFERON Nil Value: 0.01 IU/mL
QuantiFERON TB1 Ag Value: 0 IU/mL
QuantiFERON TB2 Ag Value: 0.01 IU/mL
QuantiFERON-TB Gold Plus: NEGATIVE

## 2021-04-22 ENCOUNTER — Ambulatory Visit: Payer: Medicare HMO | Admitting: Nurse Practitioner

## 2021-04-22 ENCOUNTER — Telehealth: Payer: Self-pay | Admitting: Family Medicine

## 2021-04-22 ENCOUNTER — Encounter: Payer: Self-pay | Admitting: Family Medicine

## 2021-04-24 ENCOUNTER — Other Ambulatory Visit: Payer: Self-pay | Admitting: Family Medicine

## 2021-04-24 MED ORDER — PREDNISONE 10 MG PO TABS: ORAL_TABLET | ORAL | 0 refills | Status: DC

## 2021-04-25 ENCOUNTER — Telehealth: Payer: Self-pay | Admitting: *Deleted

## 2021-04-25 DIAGNOSIS — Z952 Presence of prosthetic heart valve: Secondary | ICD-10-CM

## 2021-04-25 NOTE — Telephone Encounter (Signed)
Fax received mdINR PT/INR self testing service Test date/time 04/25/21 1043 am INR 1.9

## 2021-04-25 NOTE — Telephone Encounter (Signed)
Son aware.

## 2021-04-25 NOTE — Progress Notes (Signed)
done

## 2021-04-25 NOTE — Telephone Encounter (Signed)
Continue coumadin as is °

## 2021-05-02 ENCOUNTER — Telehealth: Payer: Self-pay | Admitting: *Deleted

## 2021-05-02 DIAGNOSIS — Z952 Presence of prosthetic heart valve: Secondary | ICD-10-CM

## 2021-05-02 NOTE — Telephone Encounter (Signed)
Son aware and verbalizes understanding.  

## 2021-05-02 NOTE — Telephone Encounter (Signed)
Fax received mdINR PT/INR self testing service Test date/time 05/02/21 1120 am INR 3.7

## 2021-05-02 NOTE — Telephone Encounter (Signed)
Change coumadin as follows: Hold Today. Recheck INR tomorrow

## 2021-05-03 ENCOUNTER — Telehealth: Payer: Self-pay | Admitting: *Deleted

## 2021-05-03 DIAGNOSIS — M5431 Sciatica, right side: Secondary | ICD-10-CM | POA: Diagnosis not present

## 2021-05-03 DIAGNOSIS — M9903 Segmental and somatic dysfunction of lumbar region: Secondary | ICD-10-CM | POA: Diagnosis not present

## 2021-05-03 DIAGNOSIS — Z952 Presence of prosthetic heart valve: Secondary | ICD-10-CM

## 2021-05-03 NOTE — Telephone Encounter (Signed)
[name] aware of dose change and to recheck on Friday

## 2021-05-03 NOTE — Telephone Encounter (Signed)
Fax received mdINR PT/INR self testing service Test date/time 05/03/21 1117 am INR 2.8

## 2021-05-06 ENCOUNTER — Telehealth: Payer: Self-pay | Admitting: *Deleted

## 2021-05-06 DIAGNOSIS — Z952 Presence of prosthetic heart valve: Secondary | ICD-10-CM

## 2021-05-06 NOTE — Telephone Encounter (Signed)
Continue coumadin alternating every other day as before. Recheck Monday

## 2021-05-06 NOTE — Telephone Encounter (Signed)
Fax received mdINR PT/INR self testing service Test date/time 05/06/21 1046 am INR 1.6

## 2021-05-06 NOTE — Telephone Encounter (Signed)
Son aware of coumadin dosing and to recheck on Monday

## 2021-05-07 ENCOUNTER — Other Ambulatory Visit: Payer: Self-pay | Admitting: Family Medicine

## 2021-05-09 ENCOUNTER — Telehealth: Payer: Self-pay | Admitting: *Deleted

## 2021-05-09 DIAGNOSIS — Z952 Presence of prosthetic heart valve: Secondary | ICD-10-CM

## 2021-05-09 NOTE — Telephone Encounter (Signed)
Continue coumadin as is °

## 2021-05-09 NOTE — Telephone Encounter (Signed)
Fax received mdINR PT/INR self testing service Test date/time 05/09/21 1152 am INR 2.1

## 2021-05-09 NOTE — Telephone Encounter (Signed)
Patient's son aware of instruction

## 2021-05-12 ENCOUNTER — Other Ambulatory Visit: Payer: Self-pay | Admitting: Family Medicine

## 2021-05-16 DIAGNOSIS — Z7901 Long term (current) use of anticoagulants: Secondary | ICD-10-CM | POA: Diagnosis not present

## 2021-05-16 DIAGNOSIS — I4821 Permanent atrial fibrillation: Secondary | ICD-10-CM | POA: Diagnosis not present

## 2021-05-17 ENCOUNTER — Telehealth: Payer: Self-pay

## 2021-05-17 NOTE — Telephone Encounter (Signed)
INR/Prothrombin Time  05/16/21 at 11:17am  INR- 3.0

## 2021-05-18 NOTE — Telephone Encounter (Signed)
Please clarify the dose. This looks like he is taking 2 mg tabl3tw four times a day and three mg tablets three times a day. Is it posible he is taking 2 pills four days a week and three pills three days a week?

## 2021-05-18 NOTE — Telephone Encounter (Signed)
Please verify with [name] what dose pt. Is taking. Hold coumadin today.

## 2021-05-18 NOTE — Telephone Encounter (Signed)
[name] states pt is taking 2 4x a day and 3 3x a day. States if [facility] could he would like a script of 90 2mg  coumadin

## 2021-05-19 ENCOUNTER — Other Ambulatory Visit: Payer: Self-pay | Admitting: *Deleted

## 2021-05-19 MED ORDER — WARFARIN SODIUM 2 MG PO TABS: ORAL_TABLET | ORAL | 3 refills | Status: DC

## 2021-05-19 NOTE — Telephone Encounter (Signed)
Dr Darlyn Read spoke with son [name] and advised to take 2 mg every day. Son in agreement.

## 2021-05-24 ENCOUNTER — Telehealth: Payer: Self-pay | Admitting: *Deleted

## 2021-05-24 DIAGNOSIS — Z952 Presence of prosthetic heart valve: Secondary | ICD-10-CM

## 2021-05-24 NOTE — Telephone Encounter (Signed)
Reduce dose to 1/2 tablet on Tues & Fri. One tablet the other days. (Tablets are two mg)

## 2021-05-24 NOTE — Telephone Encounter (Signed)
Fax received mdINR PT/INR self testing service Test date/time 05/23/21 1230 pm INR 2.4

## 2021-05-26 ENCOUNTER — Telehealth: Payer: Self-pay | Admitting: Family Medicine

## 2021-05-27 NOTE — Telephone Encounter (Signed)
Patient aware [facility] not here will advise when he gets back. Not sure what is going to happen with INR. Son states they said they could not do it there. Do you know what is going to happen.

## 2021-05-27 NOTE — Telephone Encounter (Signed)
I can order weekly INR. They can make arrangements for that.

## 2021-05-27 NOTE — Telephone Encounter (Signed)
Patient aware and verbalized understanding. °

## 2021-05-28 ENCOUNTER — Telehealth: Payer: Self-pay | Admitting: Family Medicine

## 2021-05-28 ENCOUNTER — Other Ambulatory Visit: Payer: Self-pay | Admitting: Family Medicine

## 2021-05-28 DIAGNOSIS — F0281 Dementia in other diseases classified elsewhere with behavioral disturbance: Secondary | ICD-10-CM

## 2021-05-28 DIAGNOSIS — F02818 Dementia in other diseases classified elsewhere, unspecified severity, with other behavioral disturbance: Secondary | ICD-10-CM

## 2021-05-28 DIAGNOSIS — F0150 Vascular dementia without behavioral disturbance: Secondary | ICD-10-CM

## 2021-05-28 NOTE — Telephone Encounter (Signed)
**  [facility] After Hours/ Emergency Line Call**  Patient: [name] .  PCP: [name], MD  Louretta Parma calling from [facility] to inquire about Mr Willcutt's Coumadin regimen. He apparently has just been moved to [facility] as of last week and the current directions they had on file was 3mg  alternating with 2mg .  This was identified as incorrect by a family member. According to the 05/24/21 telephone note: Patient is to take 2mg  daily except 1mg  on Tuesdays and Fridays.  Last INR 2.4 with goal 2.0-2.5.   I verbally verified patient's regimen as documented in PCP's last telephone note and she asks if the office can fax order as outlined by PCP at  [phone] Attn: on Monday.  Will cc to [place] and PCP so that this is taken care of Monday.  Saumya Hukill M. [phone], DO

## 2021-05-30 ENCOUNTER — Telehealth: Payer: Self-pay | Admitting: Family Medicine

## 2021-05-30 ENCOUNTER — Telehealth: Payer: Self-pay | Admitting: *Deleted

## 2021-05-30 DIAGNOSIS — Z952 Presence of prosthetic heart valve: Secondary | ICD-10-CM

## 2021-05-30 NOTE — Telephone Encounter (Signed)
Fax received mdINR PT/INR self testing service Test date/time 05/30/21 129 pm INR 2.0

## 2021-05-30 NOTE — Telephone Encounter (Signed)
Routed 05/24/21 telephone message with last INR report with current dose to [facility]

## 2021-05-31 ENCOUNTER — Other Ambulatory Visit: Payer: Self-pay | Admitting: Family Medicine

## 2021-05-31 MED ORDER — WARFARIN SODIUM 2 MG PO TABS: ORAL_TABLET | ORAL | 3 refills | Status: DC

## 2021-05-31 NOTE — Telephone Encounter (Signed)
[facility] states patient will have to have a home health order for skilled nursing to complete INR checks. She states that they have one's that come out and do the B12 injections also. Can we follow up with son.

## 2021-05-31 NOTE — Telephone Encounter (Signed)
Spoke with son and gave him dosing instructions.  He reports that patient is at [facility] now.  He said his levels were off last week and that he was told to take 2 mg daily at that time.  He said since he is at 2.0 now he would rather he continue the 2 mg daily until he has his levels rechecked again in a week.

## 2021-05-31 NOTE — Telephone Encounter (Signed)
TAke coumadin as follows: One tablet by mouth daily except take only 1/2 tablet on Tuesdays and Thursdays. Tablets are 2 mg

## 2021-05-31 NOTE — Telephone Encounter (Signed)
Call to [name] to explain that a F2F / video visit is needed in order to place order for [facility] to come out and to [facility] & B12 shots at [facility]. Appt made for Monday 06/06/21 for video visit

## 2021-05-31 NOTE — Telephone Encounter (Signed)
Coumadin 2 mg tablet. Sig: One tablet by mouth daily except take only 1/2 tablet on Tuesdays and Thursdays.Marland Kitchen

## 2021-06-01 NOTE — Telephone Encounter (Signed)
Routed today's INR results and dosage message to [facility]

## 2021-06-06 ENCOUNTER — Encounter: Payer: Self-pay | Admitting: Family Medicine

## 2021-06-06 ENCOUNTER — Telehealth: Payer: Self-pay | Admitting: *Deleted

## 2021-06-06 ENCOUNTER — Other Ambulatory Visit: Payer: Self-pay | Admitting: Family Medicine

## 2021-06-06 ENCOUNTER — Telehealth (INDEPENDENT_AMBULATORY_CARE_PROVIDER_SITE_OTHER): Payer: Medicare HMO | Admitting: Family Medicine

## 2021-06-06 DIAGNOSIS — I482 Chronic atrial fibrillation, unspecified: Secondary | ICD-10-CM | POA: Diagnosis not present

## 2021-06-06 DIAGNOSIS — Z952 Presence of prosthetic heart valve: Secondary | ICD-10-CM

## 2021-06-06 DIAGNOSIS — I4821 Permanent atrial fibrillation: Secondary | ICD-10-CM | POA: Diagnosis not present

## 2021-06-06 DIAGNOSIS — Z7901 Long term (current) use of anticoagulants: Secondary | ICD-10-CM

## 2021-06-06 DIAGNOSIS — F0150 Vascular dementia without behavioral disturbance: Secondary | ICD-10-CM

## 2021-06-06 LAB — POCT INR: INR: 2.7 (ref 2.0–3.0)

## 2021-06-06 MED ORDER — WARFARIN SODIUM 2 MG PO TABS: ORAL_TABLET | ORAL | 3 refills | Status: DC

## 2021-06-06 MED ORDER — VITAMIN B-12 1000 MCG PO TABS: 1000.0000 ug | ORAL_TABLET | Freq: Every day | ORAL | Status: AC

## 2021-06-06 NOTE — Telephone Encounter (Signed)
Change coumadin as follows:    Description   Use 1/2 tab today and qod, alternating with one tab qod ( 2 mg tab)

## 2021-06-06 NOTE — Telephone Encounter (Signed)
Fax received mdINR PT/INR self testing service Test date/time 06/06/21 1008 am INR 2.7  Dose instructions also need to be faxed to [facility] since pt is a resident there
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
## 2021-06-06 NOTE — Telephone Encounter (Signed)
Today's message routed to [facility]

## 2021-06-09 ENCOUNTER — Telehealth: Payer: Self-pay

## 2021-06-09 ENCOUNTER — Other Ambulatory Visit: Payer: Self-pay | Admitting: Family Medicine

## 2021-06-09 DIAGNOSIS — R4689 Other symptoms and signs involving appearance and behavior: Secondary | ICD-10-CM | POA: Diagnosis not present

## 2021-06-09 DIAGNOSIS — F4322 Adjustment disorder with anxiety: Secondary | ICD-10-CM | POA: Diagnosis not present

## 2021-06-09 DIAGNOSIS — F0391 Unspecified dementia with behavioral disturbance: Secondary | ICD-10-CM | POA: Diagnosis not present

## 2021-06-09 MED ORDER — DEBROX 6.5 % OT SOLN: OTIC | 0 refills | Status: AC

## 2021-06-09 NOTE — Telephone Encounter (Signed)
Patient is complaining to caregiver at [facility] that his right ear is stopped up and he is having difficulty hearing out of it.  His son has confirmed with him that he has had wax build up in that ear before.  Misty at [facility] has asked if you will fax over an order for Debrox drops to [phone].

## 2021-06-09 NOTE — Telephone Encounter (Signed)
Please fax to [phone]

## 2021-06-10 DIAGNOSIS — F4322 Adjustment disorder with anxiety: Secondary | ICD-10-CM | POA: Diagnosis not present

## 2021-06-14 ENCOUNTER — Other Ambulatory Visit: Payer: Self-pay | Admitting: *Deleted

## 2021-06-14 DIAGNOSIS — F0281 Dementia in other diseases classified elsewhere with behavioral disturbance: Secondary | ICD-10-CM

## 2021-06-14 DIAGNOSIS — F0150 Vascular dementia without behavioral disturbance: Secondary | ICD-10-CM

## 2021-06-14 DIAGNOSIS — F02818 Dementia in other diseases classified elsewhere, unspecified severity, with other behavioral disturbance: Secondary | ICD-10-CM

## 2021-06-14 MED ORDER — DONEPEZIL HCL 10 MG PO TABS: ORAL_TABLET | ORAL | 0 refills | Status: DC

## 2021-07-06 DIAGNOSIS — F4322 Adjustment disorder with anxiety: Secondary | ICD-10-CM | POA: Diagnosis not present

## 2021-07-07 ENCOUNTER — Telehealth: Payer: Self-pay | Admitting: Family Medicine

## 2021-07-07 DIAGNOSIS — R4689 Other symptoms and signs involving appearance and behavior: Secondary | ICD-10-CM | POA: Diagnosis not present

## 2021-07-07 DIAGNOSIS — F4322 Adjustment disorder with anxiety: Secondary | ICD-10-CM | POA: Diagnosis not present

## 2021-07-07 DIAGNOSIS — F0391 Unspecified dementia with behavioral disturbance: Secondary | ICD-10-CM | POA: Diagnosis not present

## 2021-07-08 ENCOUNTER — Telehealth: Payer: Self-pay | Admitting: Family Medicine

## 2021-07-08 NOTE — Telephone Encounter (Signed)
Morrie Sheldon called from [facility] stating that they received order for patient to have his INR checked. Says F2F notes are needed and also order for Home Health Skilled Nursing to check INR.  Can fax documents to them at [phone]

## 2021-07-11 NOTE — Telephone Encounter (Signed)
OV notes from 06/06/21 to be faxed to [facility]  Please sign [facility] referral which is pending

## 2021-07-12 NOTE — Telephone Encounter (Signed)
Removed internal referral to be signed Received faxed [facility] referral form from [facility] to have provider sign

## 2021-07-14 ENCOUNTER — Other Ambulatory Visit: Payer: Self-pay | Admitting: Family Medicine

## 2021-07-15 DIAGNOSIS — Z7901 Long term (current) use of anticoagulants: Secondary | ICD-10-CM | POA: Diagnosis not present

## 2021-07-15 DIAGNOSIS — Z5181 Encounter for therapeutic drug level monitoring: Secondary | ICD-10-CM | POA: Diagnosis not present

## 2021-07-15 DIAGNOSIS — I482 Chronic atrial fibrillation, unspecified: Secondary | ICD-10-CM | POA: Diagnosis not present

## 2021-07-15 DIAGNOSIS — F0390 Unspecified dementia without behavioral disturbance: Secondary | ICD-10-CM | POA: Diagnosis not present

## 2021-07-15 DIAGNOSIS — R2681 Unsteadiness on feet: Secondary | ICD-10-CM | POA: Diagnosis not present

## 2021-07-15 DIAGNOSIS — Z952 Presence of prosthetic heart valve: Secondary | ICD-10-CM | POA: Diagnosis not present

## 2021-07-20 ENCOUNTER — Telehealth: Payer: Self-pay | Admitting: *Deleted

## 2021-07-20 DIAGNOSIS — Z5181 Encounter for therapeutic drug level monitoring: Secondary | ICD-10-CM | POA: Diagnosis not present

## 2021-07-20 DIAGNOSIS — F0390 Unspecified dementia without behavioral disturbance: Secondary | ICD-10-CM | POA: Diagnosis not present

## 2021-07-20 DIAGNOSIS — Z7901 Long term (current) use of anticoagulants: Secondary | ICD-10-CM | POA: Diagnosis not present

## 2021-07-20 DIAGNOSIS — R2681 Unsteadiness on feet: Secondary | ICD-10-CM | POA: Diagnosis not present

## 2021-07-20 DIAGNOSIS — F4322 Adjustment disorder with anxiety: Secondary | ICD-10-CM | POA: Diagnosis not present

## 2021-07-20 DIAGNOSIS — I482 Chronic atrial fibrillation, unspecified: Secondary | ICD-10-CM | POA: Diagnosis not present

## 2021-07-20 DIAGNOSIS — Z952 Presence of prosthetic heart valve: Secondary | ICD-10-CM | POA: Diagnosis not present

## 2021-07-20 NOTE — Telephone Encounter (Signed)
Confirm with facility the dose they are giving him.

## 2021-07-20 NOTE — Telephone Encounter (Signed)
TC from [place] w/ [facility]  INR today 1.2 alternating 2 mg & 1 mg  Call Marisue Ivan back [phone] And fax any changes and orders to [facility]

## 2021-07-21 NOTE — Telephone Encounter (Signed)
Have them give him 1 mg extra today. Recheck on Monday.

## 2021-07-21 NOTE — Telephone Encounter (Signed)
[name] is aware, orders faxed to [facility]

## 2021-07-21 NOTE — Telephone Encounter (Signed)
Pt alternates between 2mg  one day to 1mg  the next and then repeat 2mg  then 1 mg

## 2021-07-22 DIAGNOSIS — Z7901 Long term (current) use of anticoagulants: Secondary | ICD-10-CM | POA: Diagnosis not present

## 2021-07-22 DIAGNOSIS — Z952 Presence of prosthetic heart valve: Secondary | ICD-10-CM | POA: Diagnosis not present

## 2021-07-22 DIAGNOSIS — I482 Chronic atrial fibrillation, unspecified: Secondary | ICD-10-CM | POA: Diagnosis not present

## 2021-07-22 DIAGNOSIS — Z5181 Encounter for therapeutic drug level monitoring: Secondary | ICD-10-CM | POA: Diagnosis not present

## 2021-07-22 DIAGNOSIS — F0390 Unspecified dementia without behavioral disturbance: Secondary | ICD-10-CM | POA: Diagnosis not present

## 2021-07-22 DIAGNOSIS — R2681 Unsteadiness on feet: Secondary | ICD-10-CM | POA: Diagnosis not present

## 2021-07-25 ENCOUNTER — Telehealth: Payer: Self-pay

## 2021-07-25 DIAGNOSIS — Z952 Presence of prosthetic heart valve: Secondary | ICD-10-CM | POA: Diagnosis not present

## 2021-07-25 DIAGNOSIS — F0390 Unspecified dementia without behavioral disturbance: Secondary | ICD-10-CM | POA: Diagnosis not present

## 2021-07-25 DIAGNOSIS — Z5181 Encounter for therapeutic drug level monitoring: Secondary | ICD-10-CM | POA: Diagnosis not present

## 2021-07-25 DIAGNOSIS — R2681 Unsteadiness on feet: Secondary | ICD-10-CM | POA: Diagnosis not present

## 2021-07-25 DIAGNOSIS — I482 Chronic atrial fibrillation, unspecified: Secondary | ICD-10-CM | POA: Diagnosis not present

## 2021-07-25 DIAGNOSIS — Z7901 Long term (current) use of anticoagulants: Secondary | ICD-10-CM | POA: Diagnosis not present

## 2021-07-25 NOTE — Telephone Encounter (Signed)
Kim from [facility] called to make Dr. Darlyn Read aware that pts INR is 1.1  She is requesting orders.  Call back [phone].

## 2021-07-26 DIAGNOSIS — Z5181 Encounter for therapeutic drug level monitoring: Secondary | ICD-10-CM | POA: Diagnosis not present

## 2021-07-26 DIAGNOSIS — Z952 Presence of prosthetic heart valve: Secondary | ICD-10-CM | POA: Diagnosis not present

## 2021-07-26 DIAGNOSIS — R2681 Unsteadiness on feet: Secondary | ICD-10-CM | POA: Diagnosis not present

## 2021-07-26 DIAGNOSIS — F0390 Unspecified dementia without behavioral disturbance: Secondary | ICD-10-CM | POA: Diagnosis not present

## 2021-07-26 DIAGNOSIS — I482 Chronic atrial fibrillation, unspecified: Secondary | ICD-10-CM | POA: Diagnosis not present

## 2021-07-26 DIAGNOSIS — Z7901 Long term (current) use of anticoagulants: Secondary | ICD-10-CM | POA: Diagnosis not present

## 2021-07-26 NOTE — Telephone Encounter (Signed)
Left message for [name] informing of Dr. Darlyn Read recommendations.  Faxed orders to [facility] in [place].

## 2021-07-26 NOTE — Telephone Encounter (Signed)
Increase coumadin to 2 mg daily. Recheck INR in 1 week

## 2021-07-28 ENCOUNTER — Ambulatory Visit (INDEPENDENT_AMBULATORY_CARE_PROVIDER_SITE_OTHER): Payer: Medicare HMO

## 2021-07-28 ENCOUNTER — Other Ambulatory Visit: Payer: Self-pay

## 2021-07-28 DIAGNOSIS — R2681 Unsteadiness on feet: Secondary | ICD-10-CM

## 2021-07-28 DIAGNOSIS — Z7901 Long term (current) use of anticoagulants: Secondary | ICD-10-CM

## 2021-07-28 DIAGNOSIS — F0390 Unspecified dementia without behavioral disturbance: Secondary | ICD-10-CM | POA: Diagnosis not present

## 2021-07-28 DIAGNOSIS — I482 Chronic atrial fibrillation, unspecified: Secondary | ICD-10-CM | POA: Diagnosis not present

## 2021-07-28 DIAGNOSIS — Z952 Presence of prosthetic heart valve: Secondary | ICD-10-CM | POA: Diagnosis not present

## 2021-07-28 DIAGNOSIS — Z5181 Encounter for therapeutic drug level monitoring: Secondary | ICD-10-CM

## 2021-08-02 DIAGNOSIS — I482 Chronic atrial fibrillation, unspecified: Secondary | ICD-10-CM | POA: Diagnosis not present

## 2021-08-02 DIAGNOSIS — Z5181 Encounter for therapeutic drug level monitoring: Secondary | ICD-10-CM | POA: Diagnosis not present

## 2021-08-02 DIAGNOSIS — Z952 Presence of prosthetic heart valve: Secondary | ICD-10-CM | POA: Diagnosis not present

## 2021-08-02 DIAGNOSIS — Z7901 Long term (current) use of anticoagulants: Secondary | ICD-10-CM | POA: Diagnosis not present

## 2021-08-02 DIAGNOSIS — F0390 Unspecified dementia without behavioral disturbance: Secondary | ICD-10-CM | POA: Diagnosis not present

## 2021-08-02 DIAGNOSIS — R2681 Unsteadiness on feet: Secondary | ICD-10-CM | POA: Diagnosis not present

## 2021-08-03 DIAGNOSIS — I482 Chronic atrial fibrillation, unspecified: Secondary | ICD-10-CM | POA: Diagnosis not present

## 2021-08-03 DIAGNOSIS — F0390 Unspecified dementia without behavioral disturbance: Secondary | ICD-10-CM | POA: Diagnosis not present

## 2021-08-03 DIAGNOSIS — R2681 Unsteadiness on feet: Secondary | ICD-10-CM | POA: Diagnosis not present

## 2021-08-03 DIAGNOSIS — Z5181 Encounter for therapeutic drug level monitoring: Secondary | ICD-10-CM | POA: Diagnosis not present

## 2021-08-03 DIAGNOSIS — Z7901 Long term (current) use of anticoagulants: Secondary | ICD-10-CM | POA: Diagnosis not present

## 2021-08-03 DIAGNOSIS — Z952 Presence of prosthetic heart valve: Secondary | ICD-10-CM | POA: Diagnosis not present

## 2021-08-04 DIAGNOSIS — F0390 Unspecified dementia without behavioral disturbance: Secondary | ICD-10-CM | POA: Diagnosis not present

## 2021-08-04 DIAGNOSIS — F4322 Adjustment disorder with anxiety: Secondary | ICD-10-CM | POA: Diagnosis not present

## 2021-08-04 DIAGNOSIS — R2681 Unsteadiness on feet: Secondary | ICD-10-CM | POA: Diagnosis not present

## 2021-08-04 DIAGNOSIS — Z5181 Encounter for therapeutic drug level monitoring: Secondary | ICD-10-CM | POA: Diagnosis not present

## 2021-08-04 DIAGNOSIS — F0391 Unspecified dementia with behavioral disturbance: Secondary | ICD-10-CM | POA: Diagnosis not present

## 2021-08-04 DIAGNOSIS — R4689 Other symptoms and signs involving appearance and behavior: Secondary | ICD-10-CM | POA: Diagnosis not present

## 2021-08-04 DIAGNOSIS — I482 Chronic atrial fibrillation, unspecified: Secondary | ICD-10-CM | POA: Diagnosis not present

## 2021-08-04 DIAGNOSIS — Z7901 Long term (current) use of anticoagulants: Secondary | ICD-10-CM | POA: Diagnosis not present

## 2021-08-04 DIAGNOSIS — Z952 Presence of prosthetic heart valve: Secondary | ICD-10-CM | POA: Diagnosis not present

## 2021-08-10 ENCOUNTER — Telehealth: Payer: Self-pay | Admitting: *Deleted

## 2021-08-10 ENCOUNTER — Other Ambulatory Visit: Payer: Self-pay | Admitting: Family Medicine

## 2021-08-10 DIAGNOSIS — I482 Chronic atrial fibrillation, unspecified: Secondary | ICD-10-CM | POA: Diagnosis not present

## 2021-08-10 DIAGNOSIS — Z952 Presence of prosthetic heart valve: Secondary | ICD-10-CM | POA: Diagnosis not present

## 2021-08-10 DIAGNOSIS — F0390 Unspecified dementia without behavioral disturbance: Secondary | ICD-10-CM | POA: Diagnosis not present

## 2021-08-10 DIAGNOSIS — F02818 Dementia in other diseases classified elsewhere, unspecified severity, with other behavioral disturbance: Secondary | ICD-10-CM

## 2021-08-10 DIAGNOSIS — F0281 Dementia in other diseases classified elsewhere with behavioral disturbance: Secondary | ICD-10-CM

## 2021-08-10 DIAGNOSIS — Z5181 Encounter for therapeutic drug level monitoring: Secondary | ICD-10-CM | POA: Diagnosis not present

## 2021-08-10 DIAGNOSIS — F0150 Vascular dementia without behavioral disturbance: Secondary | ICD-10-CM

## 2021-08-10 DIAGNOSIS — F4322 Adjustment disorder with anxiety: Secondary | ICD-10-CM | POA: Diagnosis not present

## 2021-08-10 DIAGNOSIS — Z7901 Long term (current) use of anticoagulants: Secondary | ICD-10-CM | POA: Diagnosis not present

## 2021-08-10 DIAGNOSIS — R2681 Unsteadiness on feet: Secondary | ICD-10-CM | POA: Diagnosis not present

## 2021-08-10 NOTE — Telephone Encounter (Signed)
VM from [name] w/ [facility] Pt's INR today was 1.0 She checked w/ [facility], pt is given & taking 2 mg daily, no missed doses  Please advise and call [name] back [phone] And any changes need to be sent to [facility]

## 2021-08-10 NOTE — Telephone Encounter (Signed)
LMOVM to [employer] on following dose change to 3 mg daily, and rck INR in 1 wk  This note has also been printed and faxed to [employer] [phone]

## 2021-08-10 NOTE — Telephone Encounter (Signed)
Change coumadin as follows: 3 mg daily. Recheck INR in 1 week.

## 2021-08-12 DIAGNOSIS — R2681 Unsteadiness on feet: Secondary | ICD-10-CM | POA: Diagnosis not present

## 2021-08-12 DIAGNOSIS — Z7901 Long term (current) use of anticoagulants: Secondary | ICD-10-CM | POA: Diagnosis not present

## 2021-08-12 DIAGNOSIS — F0390 Unspecified dementia without behavioral disturbance: Secondary | ICD-10-CM | POA: Diagnosis not present

## 2021-08-12 DIAGNOSIS — Z952 Presence of prosthetic heart valve: Secondary | ICD-10-CM | POA: Diagnosis not present

## 2021-08-12 DIAGNOSIS — Z5181 Encounter for therapeutic drug level monitoring: Secondary | ICD-10-CM | POA: Diagnosis not present

## 2021-08-12 DIAGNOSIS — I482 Chronic atrial fibrillation, unspecified: Secondary | ICD-10-CM | POA: Diagnosis not present

## 2021-08-15 ENCOUNTER — Ambulatory Visit (INDEPENDENT_AMBULATORY_CARE_PROVIDER_SITE_OTHER): Payer: Medicare HMO | Admitting: Nurse Practitioner

## 2021-08-15 ENCOUNTER — Encounter: Payer: Self-pay | Admitting: Nurse Practitioner

## 2021-08-15 DIAGNOSIS — L89151 Pressure ulcer of sacral region, stage 1: Secondary | ICD-10-CM

## 2021-08-15 MED ORDER — CALMOSEPTINE 0.44-20.6 % EX OINT: TOPICAL_OINTMENT | CUTANEOUS | 2 refills | Status: AC

## 2021-08-15 NOTE — Progress Notes (Signed)
Virtual Visit  Note Due to COVID-19 pandemic this visit was conducted virtually. This visit type was conducted due to national recommendations for restrictions regarding the COVID-19 Pandemic (e.g. social distancing, sheltering in place) in an effort to limit this patient's exposure and mitigate transmission in our community. All issues noted in this document were discussed and addressed.  A physical exam was not performed with this format.  I connected with [name] on 08/15/21 at 1:42 by telephone and verified that I am speaking with the correct person using two identifiers. [name] is currently located at [employer] and his [name] is currently with hom during visit. The provider, Mary-Margaret Daphine Deutscher, FNP is located in their office at time of visit.  I discussed the limitations, risks, security and privacy concerns of performing an evaluation and management service by telephone and the availability of in person appointments. I also discussed with the patient that there may be a patient responsible charge related to this service. The patient expressed understanding and agreed to proceed.   History and Present Illness:  Sn calls stating that he has been c/o buttocks burning and stinging. He son looked at his bottom and he has a open wound on buttocks about the size of a dime. They are using zinc oxide on it. His son says it looks kinda like a pimple. Son is looking at it now and it is clearing up now.    Review of Systems  Constitutional:  Negative for diaphoresis and weight loss.  Eyes:  Negative for blurred vision, double vision and pain.  Respiratory:  Negative for shortness of breath.   Cardiovascular:  Negative for chest pain, palpitations, orthopnea and leg swelling.  Gastrointestinal:  Negative for abdominal pain.  Skin:  Negative for rash.  Neurological:  Negative for dizziness, sensory change, loss of consciousness, weakness and headaches.   Endo/Heme/Allergies:  Negative for polydipsia. Does not bruise/bleed easily.  Psychiatric/Behavioral:  Negative for memory loss. The patient does not have insomnia.   All other systems reviewed and are negative.   Observations/Objective: Alert and oriented- answers all questions appropriately No distress Son describes area as annular erythematous area about the size of a nickel on sacral area- no drainage- mildly sr eto b touch   Assessment and Plan: [name] in today with chief complaint of No chief complaint on file.   1. Pressure injury of sacral region, stage 1 Needs to seat on donut to prevent pressure on area Check daily for any skin break down  Meds ordered this encounter  Medications   Menthol-Zinc Oxide (CALMOSEPTINE) 0.44-20.6 % OINT    Sig: Apply to affected area BID    Dispense:  113 g    Refill:  2    Order Specific Question:   Supervising Provider    Answer:   Arville Care A [[id number]]        Follow Up Instructions: prn    I discussed the assessment and treatment plan with the patient. The patient was provided an opportunity to ask questions and all were answered. The patient agreed with the plan and demonstrated an understanding of the instructions.   The patient was advised to call back or seek an in-person evaluation if the symptoms worsen or if the condition fails to improve as anticipated.  The above assessment and management plan was discussed with the patient. The patient verbalized understanding of and has agreed to the management plan. Patient is aware to call the clinic  if symptoms persist or worsen. Patient is aware when to return to the clinic for a follow-up visit. Patient educated on when it is appropriate to go to the emergency department.   Time call ended:  1:55  I provided 13 minutes of  non face-to-face time during this encounter.    Mary-Margaret Daphine Deutscher, FNP

## 2021-08-17 ENCOUNTER — Telehealth: Payer: Self-pay | Admitting: *Deleted

## 2021-08-17 DIAGNOSIS — Z5181 Encounter for therapeutic drug level monitoring: Secondary | ICD-10-CM | POA: Diagnosis not present

## 2021-08-17 DIAGNOSIS — I482 Chronic atrial fibrillation, unspecified: Secondary | ICD-10-CM | POA: Diagnosis not present

## 2021-08-17 DIAGNOSIS — Z7901 Long term (current) use of anticoagulants: Secondary | ICD-10-CM | POA: Diagnosis not present

## 2021-08-17 DIAGNOSIS — Z952 Presence of prosthetic heart valve: Secondary | ICD-10-CM

## 2021-08-17 DIAGNOSIS — R2681 Unsteadiness on feet: Secondary | ICD-10-CM | POA: Diagnosis not present

## 2021-08-17 DIAGNOSIS — F0390 Unspecified dementia without behavioral disturbance: Secondary | ICD-10-CM | POA: Diagnosis not present

## 2021-08-17 NOTE — Telephone Encounter (Signed)
TC with Selena Batten w/ [facility] Pt's INR today was 1.1 She checked w/ [facility], pt is got 3 doses of 3 mg coumadin since last week, they did receive our correspondence on dose change.   Please advise and call Selena Batten back [phone] And any changes need to be sent to [facility]

## 2021-08-17 NOTE — Telephone Encounter (Signed)
Left detailed voicemail for [facility] nurse with instructions for patient and also advised that I would contact [facility] with instructions. Called [facility] multiple times and unable to reach any one. No answer and then phone cuts off.

## 2021-08-17 NOTE — Telephone Encounter (Signed)
Coumadin 2mg  for next 4 days then 1mg  sat and sun- recheck inr on Monday

## 2021-08-22 ENCOUNTER — Telehealth: Payer: Self-pay | Admitting: *Deleted

## 2021-08-22 DIAGNOSIS — F0390 Unspecified dementia without behavioral disturbance: Secondary | ICD-10-CM | POA: Diagnosis not present

## 2021-08-22 DIAGNOSIS — Z952 Presence of prosthetic heart valve: Secondary | ICD-10-CM | POA: Diagnosis not present

## 2021-08-22 DIAGNOSIS — R2681 Unsteadiness on feet: Secondary | ICD-10-CM | POA: Diagnosis not present

## 2021-08-22 DIAGNOSIS — Z7901 Long term (current) use of anticoagulants: Secondary | ICD-10-CM | POA: Diagnosis not present

## 2021-08-22 DIAGNOSIS — I482 Chronic atrial fibrillation, unspecified: Secondary | ICD-10-CM | POA: Diagnosis not present

## 2021-08-22 DIAGNOSIS — Z5181 Encounter for therapeutic drug level monitoring: Secondary | ICD-10-CM | POA: Diagnosis not present

## 2021-08-22 NOTE — Telephone Encounter (Signed)
TC with Selena Batten w/ [facility] Pt's INR today was 3.4 Last orders were for 2 mg Wed, Thur & Fri 1 mg Sat & Sun Do not have orders for today.   Please advise and call Selena Batten back [phone] And any changes need to be faxed to [facility] @ [phone]

## 2021-08-22 NOTE — Telephone Encounter (Signed)
HH aware and orders faxed

## 2021-08-22 NOTE — Telephone Encounter (Signed)
Description   INR 3.4 today- Too think  Hold today's dose (08/22/21) Then 1 mg daily. Recheck in 1 week.

## 2021-08-24 DIAGNOSIS — F4322 Adjustment disorder with anxiety: Secondary | ICD-10-CM | POA: Diagnosis not present

## 2021-08-25 ENCOUNTER — Other Ambulatory Visit: Payer: Self-pay | Admitting: Family Medicine

## 2021-08-25 DIAGNOSIS — F02818 Dementia in other diseases classified elsewhere, unspecified severity, with other behavioral disturbance: Secondary | ICD-10-CM

## 2021-08-25 DIAGNOSIS — F0150 Vascular dementia without behavioral disturbance: Secondary | ICD-10-CM

## 2021-08-25 DIAGNOSIS — G301 Alzheimer's disease with late onset: Secondary | ICD-10-CM

## 2021-08-25 DIAGNOSIS — Z7901 Long term (current) use of anticoagulants: Secondary | ICD-10-CM | POA: Diagnosis not present

## 2021-08-25 DIAGNOSIS — F0390 Unspecified dementia without behavioral disturbance: Secondary | ICD-10-CM | POA: Diagnosis not present

## 2021-08-25 DIAGNOSIS — Z5181 Encounter for therapeutic drug level monitoring: Secondary | ICD-10-CM | POA: Diagnosis not present

## 2021-08-25 DIAGNOSIS — I482 Chronic atrial fibrillation, unspecified: Secondary | ICD-10-CM | POA: Diagnosis not present

## 2021-08-25 DIAGNOSIS — R2681 Unsteadiness on feet: Secondary | ICD-10-CM | POA: Diagnosis not present

## 2021-08-25 DIAGNOSIS — Z952 Presence of prosthetic heart valve: Secondary | ICD-10-CM | POA: Diagnosis not present

## 2021-08-29 ENCOUNTER — Telehealth: Payer: Self-pay | Admitting: *Deleted

## 2021-08-29 DIAGNOSIS — Z952 Presence of prosthetic heart valve: Secondary | ICD-10-CM | POA: Diagnosis not present

## 2021-08-29 DIAGNOSIS — R2681 Unsteadiness on feet: Secondary | ICD-10-CM | POA: Diagnosis not present

## 2021-08-29 DIAGNOSIS — F0390 Unspecified dementia without behavioral disturbance: Secondary | ICD-10-CM | POA: Diagnosis not present

## 2021-08-29 DIAGNOSIS — I482 Chronic atrial fibrillation, unspecified: Secondary | ICD-10-CM | POA: Diagnosis not present

## 2021-08-29 DIAGNOSIS — Z7901 Long term (current) use of anticoagulants: Secondary | ICD-10-CM | POA: Diagnosis not present

## 2021-08-29 DIAGNOSIS — Z5181 Encounter for therapeutic drug level monitoring: Secondary | ICD-10-CM | POA: Diagnosis not present

## 2021-08-29 NOTE — Telephone Encounter (Signed)
LMOVM to Selena Batten w/ [facility], continue coumadin as is & rck in 1 wk Orders faxed to [facility].

## 2021-08-29 NOTE — Telephone Encounter (Signed)
TC with Selena Batten w/ [facility]antha Fallen [facility] Pt's INR today was 1.5 Pt is on 1 mg Coumadin a day   Please advise and call Selena Batten back [phone] And any changes need to be faxed to [facility] @ [phone]

## 2021-08-29 NOTE — Telephone Encounter (Signed)
Continue coumadin as is. Recheck in 1 week.

## 2021-09-02 DIAGNOSIS — R2681 Unsteadiness on feet: Secondary | ICD-10-CM | POA: Diagnosis not present

## 2021-09-02 DIAGNOSIS — Z952 Presence of prosthetic heart valve: Secondary | ICD-10-CM | POA: Diagnosis not present

## 2021-09-02 DIAGNOSIS — Z5181 Encounter for therapeutic drug level monitoring: Secondary | ICD-10-CM | POA: Diagnosis not present

## 2021-09-02 DIAGNOSIS — F0390 Unspecified dementia without behavioral disturbance: Secondary | ICD-10-CM | POA: Diagnosis not present

## 2021-09-02 DIAGNOSIS — I482 Chronic atrial fibrillation, unspecified: Secondary | ICD-10-CM | POA: Diagnosis not present

## 2021-09-02 DIAGNOSIS — Z7901 Long term (current) use of anticoagulants: Secondary | ICD-10-CM | POA: Diagnosis not present

## 2021-09-08 DIAGNOSIS — F03918 Unspecified dementia, unspecified severity, with other behavioral disturbance: Secondary | ICD-10-CM | POA: Diagnosis not present

## 2021-09-08 DIAGNOSIS — R4689 Other symptoms and signs involving appearance and behavior: Secondary | ICD-10-CM | POA: Diagnosis not present

## 2021-09-08 DIAGNOSIS — F419 Anxiety disorder, unspecified: Secondary | ICD-10-CM | POA: Diagnosis not present

## 2021-09-09 ENCOUNTER — Telehealth: Payer: Self-pay

## 2021-09-09 DIAGNOSIS — I482 Chronic atrial fibrillation, unspecified: Secondary | ICD-10-CM | POA: Diagnosis not present

## 2021-09-09 DIAGNOSIS — Z7901 Long term (current) use of anticoagulants: Secondary | ICD-10-CM | POA: Diagnosis not present

## 2021-09-09 DIAGNOSIS — Z5181 Encounter for therapeutic drug level monitoring: Secondary | ICD-10-CM | POA: Diagnosis not present

## 2021-09-09 DIAGNOSIS — F0390 Unspecified dementia without behavioral disturbance: Secondary | ICD-10-CM | POA: Diagnosis not present

## 2021-09-09 DIAGNOSIS — R2681 Unsteadiness on feet: Secondary | ICD-10-CM | POA: Diagnosis not present

## 2021-09-09 DIAGNOSIS — Z952 Presence of prosthetic heart valve: Secondary | ICD-10-CM | POA: Diagnosis not present

## 2021-09-09 NOTE — Telephone Encounter (Signed)
Marisue Ivan from [facility] is calling to make you aware that patient had a fall on Wednesday, 09/07/21.  The assisted living facility reported he was not taken to hospital and no injuries were reported.

## 2021-09-10 DIAGNOSIS — F4322 Adjustment disorder with anxiety: Secondary | ICD-10-CM | POA: Diagnosis not present

## 2021-09-13 DIAGNOSIS — Z952 Presence of prosthetic heart valve: Secondary | ICD-10-CM | POA: Diagnosis not present

## 2021-09-13 DIAGNOSIS — Z9181 History of falling: Secondary | ICD-10-CM | POA: Diagnosis not present

## 2021-09-13 DIAGNOSIS — Z7901 Long term (current) use of anticoagulants: Secondary | ICD-10-CM | POA: Diagnosis not present

## 2021-09-13 DIAGNOSIS — Z950 Presence of cardiac pacemaker: Secondary | ICD-10-CM | POA: Diagnosis not present

## 2021-09-13 DIAGNOSIS — Z5181 Encounter for therapeutic drug level monitoring: Secondary | ICD-10-CM | POA: Diagnosis not present

## 2021-09-13 DIAGNOSIS — Z8673 Personal history of transient ischemic attack (TIA), and cerebral infarction without residual deficits: Secondary | ICD-10-CM | POA: Diagnosis not present

## 2021-09-13 DIAGNOSIS — R2681 Unsteadiness on feet: Secondary | ICD-10-CM | POA: Diagnosis not present

## 2021-09-13 DIAGNOSIS — I482 Chronic atrial fibrillation, unspecified: Secondary | ICD-10-CM | POA: Diagnosis not present

## 2021-09-13 DIAGNOSIS — F0390 Unspecified dementia without behavioral disturbance: Secondary | ICD-10-CM | POA: Diagnosis not present

## 2021-09-15 ENCOUNTER — Telehealth: Payer: Self-pay | Admitting: *Deleted

## 2021-09-15 DIAGNOSIS — F4322 Adjustment disorder with anxiety: Secondary | ICD-10-CM | POA: Diagnosis not present

## 2021-09-15 NOTE — Telephone Encounter (Signed)
Increase coumadin to 2 mg qod alternating with 1 mg qod

## 2021-09-15 NOTE — Telephone Encounter (Signed)
TC with [name] w/ [facility] Pt's INR today was 1.2 Pt is on 1 mg Coumadin a day   Please advise and call [name] back [phone] And any changes need to be faxed to [facility] @ [phone]

## 2021-09-16 NOTE — Telephone Encounter (Signed)
Left detailed message and faxed order to [facility]

## 2021-09-20 ENCOUNTER — Telehealth: Payer: Self-pay | Admitting: *Deleted

## 2021-09-20 DIAGNOSIS — Z952 Presence of prosthetic heart valve: Secondary | ICD-10-CM | POA: Diagnosis not present

## 2021-09-20 DIAGNOSIS — Z7901 Long term (current) use of anticoagulants: Secondary | ICD-10-CM | POA: Diagnosis not present

## 2021-09-20 DIAGNOSIS — Z8673 Personal history of transient ischemic attack (TIA), and cerebral infarction without residual deficits: Secondary | ICD-10-CM | POA: Diagnosis not present

## 2021-09-20 DIAGNOSIS — I482 Chronic atrial fibrillation, unspecified: Secondary | ICD-10-CM | POA: Diagnosis not present

## 2021-09-20 DIAGNOSIS — Z5181 Encounter for therapeutic drug level monitoring: Secondary | ICD-10-CM | POA: Diagnosis not present

## 2021-09-20 DIAGNOSIS — Z950 Presence of cardiac pacemaker: Secondary | ICD-10-CM | POA: Diagnosis not present

## 2021-09-20 DIAGNOSIS — R2681 Unsteadiness on feet: Secondary | ICD-10-CM | POA: Diagnosis not present

## 2021-09-20 DIAGNOSIS — Z9181 History of falling: Secondary | ICD-10-CM | POA: Diagnosis not present

## 2021-09-20 DIAGNOSIS — F0390 Unspecified dementia without behavioral disturbance: Secondary | ICD-10-CM | POA: Diagnosis not present

## 2021-09-20 NOTE — Telephone Encounter (Signed)
Change coumadin as follows:   2mg  po daily

## 2021-09-20 NOTE — Telephone Encounter (Signed)
Returned call, no answer, left detailed voice message for hh nurse with new directions

## 2021-09-20 NOTE — Telephone Encounter (Signed)
Call received 09/20/2021 at 1200noon mdINR PT/INR self testing service Test date/time today  INR 1.3  Last week:  September 15, 2021 [name], MD to [facility] Clinical Pool     1:20 PM Note Increase coumadin to 2 mg qod alternating with 1 mg qod      Please call KIM (hh) back at [phone]

## 2021-09-20 NOTE — Telephone Encounter (Signed)
Called patient, no answer 

## 2021-09-21 NOTE — Telephone Encounter (Signed)
LMOVM to Selena Batten w/ [facility] & orders faxed to [facility]

## 2021-09-22 DIAGNOSIS — Z950 Presence of cardiac pacemaker: Secondary | ICD-10-CM | POA: Diagnosis not present

## 2021-09-22 DIAGNOSIS — Z952 Presence of prosthetic heart valve: Secondary | ICD-10-CM | POA: Diagnosis not present

## 2021-09-22 DIAGNOSIS — Z9181 History of falling: Secondary | ICD-10-CM | POA: Diagnosis not present

## 2021-09-22 DIAGNOSIS — R2681 Unsteadiness on feet: Secondary | ICD-10-CM | POA: Diagnosis not present

## 2021-09-22 DIAGNOSIS — I482 Chronic atrial fibrillation, unspecified: Secondary | ICD-10-CM | POA: Diagnosis not present

## 2021-09-22 DIAGNOSIS — Z7901 Long term (current) use of anticoagulants: Secondary | ICD-10-CM | POA: Diagnosis not present

## 2021-09-22 DIAGNOSIS — F0390 Unspecified dementia without behavioral disturbance: Secondary | ICD-10-CM | POA: Diagnosis not present

## 2021-09-22 DIAGNOSIS — Z5181 Encounter for therapeutic drug level monitoring: Secondary | ICD-10-CM | POA: Diagnosis not present

## 2021-09-22 DIAGNOSIS — Z8673 Personal history of transient ischemic attack (TIA), and cerebral infarction without residual deficits: Secondary | ICD-10-CM | POA: Diagnosis not present

## 2021-09-26 ENCOUNTER — Telehealth: Payer: Self-pay | Admitting: *Deleted

## 2021-09-26 DIAGNOSIS — Z952 Presence of prosthetic heart valve: Secondary | ICD-10-CM | POA: Diagnosis not present

## 2021-09-26 DIAGNOSIS — Z8673 Personal history of transient ischemic attack (TIA), and cerebral infarction without residual deficits: Secondary | ICD-10-CM | POA: Diagnosis not present

## 2021-09-26 DIAGNOSIS — F0390 Unspecified dementia without behavioral disturbance: Secondary | ICD-10-CM | POA: Diagnosis not present

## 2021-09-26 DIAGNOSIS — Z5181 Encounter for therapeutic drug level monitoring: Secondary | ICD-10-CM | POA: Diagnosis not present

## 2021-09-26 DIAGNOSIS — Z7901 Long term (current) use of anticoagulants: Secondary | ICD-10-CM | POA: Diagnosis not present

## 2021-09-26 DIAGNOSIS — Z9181 History of falling: Secondary | ICD-10-CM | POA: Diagnosis not present

## 2021-09-26 DIAGNOSIS — Z950 Presence of cardiac pacemaker: Secondary | ICD-10-CM | POA: Diagnosis not present

## 2021-09-26 DIAGNOSIS — I482 Chronic atrial fibrillation, unspecified: Secondary | ICD-10-CM | POA: Diagnosis not present

## 2021-09-26 DIAGNOSIS — R2681 Unsteadiness on feet: Secondary | ICD-10-CM | POA: Diagnosis not present

## 2021-09-26 NOTE — Telephone Encounter (Signed)
TC with [name] w/ [facility] Pt's INR today was 1.4 Pt is on 2 mg Coumadin a day   Please advise and call [name] back [phone] And any changes need to be faxed to [facility] @ [phone]

## 2021-09-26 NOTE — Telephone Encounter (Signed)
Change coumadin as follows:   Alternate 2 mg one day with three mg the next day

## 2021-09-26 NOTE — Telephone Encounter (Signed)
LMOVM to Selena Batten w/ [facility] to alternate 2 mg & 3 mg Copy of telephone encounter w/ dose changes faxed to [facility]

## 2021-09-29 DIAGNOSIS — Z8673 Personal history of transient ischemic attack (TIA), and cerebral infarction without residual deficits: Secondary | ICD-10-CM | POA: Diagnosis not present

## 2021-09-29 DIAGNOSIS — Z952 Presence of prosthetic heart valve: Secondary | ICD-10-CM | POA: Diagnosis not present

## 2021-09-29 DIAGNOSIS — F4322 Adjustment disorder with anxiety: Secondary | ICD-10-CM | POA: Diagnosis not present

## 2021-09-29 DIAGNOSIS — I482 Chronic atrial fibrillation, unspecified: Secondary | ICD-10-CM | POA: Diagnosis not present

## 2021-09-29 DIAGNOSIS — R2681 Unsteadiness on feet: Secondary | ICD-10-CM | POA: Diagnosis not present

## 2021-09-29 DIAGNOSIS — Z9181 History of falling: Secondary | ICD-10-CM | POA: Diagnosis not present

## 2021-09-29 DIAGNOSIS — Z5181 Encounter for therapeutic drug level monitoring: Secondary | ICD-10-CM | POA: Diagnosis not present

## 2021-09-29 DIAGNOSIS — Z950 Presence of cardiac pacemaker: Secondary | ICD-10-CM | POA: Diagnosis not present

## 2021-09-29 DIAGNOSIS — F0390 Unspecified dementia without behavioral disturbance: Secondary | ICD-10-CM | POA: Diagnosis not present

## 2021-09-29 DIAGNOSIS — Z7901 Long term (current) use of anticoagulants: Secondary | ICD-10-CM | POA: Diagnosis not present

## 2021-10-04 ENCOUNTER — Telehealth: Payer: Self-pay

## 2021-10-04 DIAGNOSIS — I482 Chronic atrial fibrillation, unspecified: Secondary | ICD-10-CM | POA: Diagnosis not present

## 2021-10-04 DIAGNOSIS — F0390 Unspecified dementia without behavioral disturbance: Secondary | ICD-10-CM | POA: Diagnosis not present

## 2021-10-04 DIAGNOSIS — Z9181 History of falling: Secondary | ICD-10-CM | POA: Diagnosis not present

## 2021-10-04 DIAGNOSIS — Z7901 Long term (current) use of anticoagulants: Secondary | ICD-10-CM | POA: Diagnosis not present

## 2021-10-04 DIAGNOSIS — Z8673 Personal history of transient ischemic attack (TIA), and cerebral infarction without residual deficits: Secondary | ICD-10-CM | POA: Diagnosis not present

## 2021-10-04 DIAGNOSIS — R2681 Unsteadiness on feet: Secondary | ICD-10-CM | POA: Diagnosis not present

## 2021-10-04 DIAGNOSIS — Z5181 Encounter for therapeutic drug level monitoring: Secondary | ICD-10-CM | POA: Diagnosis not present

## 2021-10-04 DIAGNOSIS — Z952 Presence of prosthetic heart valve: Secondary | ICD-10-CM | POA: Diagnosis not present

## 2021-10-04 DIAGNOSIS — Z950 Presence of cardiac pacemaker: Secondary | ICD-10-CM | POA: Diagnosis not present

## 2021-10-04 NOTE — Telephone Encounter (Signed)
Order faxed to [facility]. 

## 2021-10-04 NOTE — Telephone Encounter (Signed)
Continue coumadin as is. Continue to check weekly

## 2021-10-04 NOTE — Telephone Encounter (Signed)
[name] aware and verbalizes understanding.

## 2021-10-04 NOTE — Telephone Encounter (Signed)
[name] with [facility] calling to report patient's INR.  Please call back with Coumadin dosing instructions and when you would like this to be rechecked.  PT/INR was 2.4

## 2021-10-11 DIAGNOSIS — F0390 Unspecified dementia without behavioral disturbance: Secondary | ICD-10-CM | POA: Diagnosis not present

## 2021-10-11 DIAGNOSIS — R2681 Unsteadiness on feet: Secondary | ICD-10-CM | POA: Diagnosis not present

## 2021-10-11 DIAGNOSIS — Z9181 History of falling: Secondary | ICD-10-CM | POA: Diagnosis not present

## 2021-10-11 DIAGNOSIS — Z952 Presence of prosthetic heart valve: Secondary | ICD-10-CM | POA: Diagnosis not present

## 2021-10-11 DIAGNOSIS — I482 Chronic atrial fibrillation, unspecified: Secondary | ICD-10-CM | POA: Diagnosis not present

## 2021-10-11 DIAGNOSIS — Z7901 Long term (current) use of anticoagulants: Secondary | ICD-10-CM | POA: Diagnosis not present

## 2021-10-11 DIAGNOSIS — Z8673 Personal history of transient ischemic attack (TIA), and cerebral infarction without residual deficits: Secondary | ICD-10-CM | POA: Diagnosis not present

## 2021-10-11 DIAGNOSIS — Z5181 Encounter for therapeutic drug level monitoring: Secondary | ICD-10-CM | POA: Diagnosis not present

## 2021-10-11 DIAGNOSIS — Z950 Presence of cardiac pacemaker: Secondary | ICD-10-CM | POA: Diagnosis not present

## 2021-10-12 DIAGNOSIS — Z952 Presence of prosthetic heart valve: Secondary | ICD-10-CM | POA: Diagnosis not present

## 2021-10-12 DIAGNOSIS — F0390 Unspecified dementia without behavioral disturbance: Secondary | ICD-10-CM | POA: Diagnosis not present

## 2021-10-12 DIAGNOSIS — Z950 Presence of cardiac pacemaker: Secondary | ICD-10-CM | POA: Diagnosis not present

## 2021-10-12 DIAGNOSIS — Z5181 Encounter for therapeutic drug level monitoring: Secondary | ICD-10-CM | POA: Diagnosis not present

## 2021-10-12 DIAGNOSIS — F03918 Unspecified dementia, unspecified severity, with other behavioral disturbance: Secondary | ICD-10-CM | POA: Diagnosis not present

## 2021-10-12 DIAGNOSIS — Z7901 Long term (current) use of anticoagulants: Secondary | ICD-10-CM | POA: Diagnosis not present

## 2021-10-12 DIAGNOSIS — R2681 Unsteadiness on feet: Secondary | ICD-10-CM | POA: Diagnosis not present

## 2021-10-12 DIAGNOSIS — Z9181 History of falling: Secondary | ICD-10-CM | POA: Diagnosis not present

## 2021-10-12 DIAGNOSIS — Z8673 Personal history of transient ischemic attack (TIA), and cerebral infarction without residual deficits: Secondary | ICD-10-CM | POA: Diagnosis not present

## 2021-10-12 DIAGNOSIS — R4689 Other symptoms and signs involving appearance and behavior: Secondary | ICD-10-CM | POA: Diagnosis not present

## 2021-10-12 DIAGNOSIS — I482 Chronic atrial fibrillation, unspecified: Secondary | ICD-10-CM | POA: Diagnosis not present

## 2021-10-12 DIAGNOSIS — F419 Anxiety disorder, unspecified: Secondary | ICD-10-CM | POA: Diagnosis not present

## 2021-10-13 DIAGNOSIS — F0390 Unspecified dementia without behavioral disturbance: Secondary | ICD-10-CM | POA: Diagnosis not present

## 2021-10-13 DIAGNOSIS — I482 Chronic atrial fibrillation, unspecified: Secondary | ICD-10-CM | POA: Diagnosis not present

## 2021-10-13 DIAGNOSIS — Z952 Presence of prosthetic heart valve: Secondary | ICD-10-CM | POA: Diagnosis not present

## 2021-10-13 DIAGNOSIS — Z8673 Personal history of transient ischemic attack (TIA), and cerebral infarction without residual deficits: Secondary | ICD-10-CM | POA: Diagnosis not present

## 2021-10-13 DIAGNOSIS — R2681 Unsteadiness on feet: Secondary | ICD-10-CM | POA: Diagnosis not present

## 2021-10-13 DIAGNOSIS — Z7901 Long term (current) use of anticoagulants: Secondary | ICD-10-CM | POA: Diagnosis not present

## 2021-10-13 DIAGNOSIS — F4322 Adjustment disorder with anxiety: Secondary | ICD-10-CM | POA: Diagnosis not present

## 2021-10-13 DIAGNOSIS — Z950 Presence of cardiac pacemaker: Secondary | ICD-10-CM | POA: Diagnosis not present

## 2021-10-13 DIAGNOSIS — Z5181 Encounter for therapeutic drug level monitoring: Secondary | ICD-10-CM | POA: Diagnosis not present

## 2021-10-13 DIAGNOSIS — Z9181 History of falling: Secondary | ICD-10-CM | POA: Diagnosis not present

## 2021-10-19 ENCOUNTER — Telehealth: Payer: Self-pay | Admitting: *Deleted

## 2021-10-19 DIAGNOSIS — F0390 Unspecified dementia without behavioral disturbance: Secondary | ICD-10-CM | POA: Diagnosis not present

## 2021-10-19 DIAGNOSIS — Z950 Presence of cardiac pacemaker: Secondary | ICD-10-CM | POA: Diagnosis not present

## 2021-10-19 DIAGNOSIS — I482 Chronic atrial fibrillation, unspecified: Secondary | ICD-10-CM | POA: Diagnosis not present

## 2021-10-19 DIAGNOSIS — Z952 Presence of prosthetic heart valve: Secondary | ICD-10-CM | POA: Diagnosis not present

## 2021-10-19 DIAGNOSIS — Z7901 Long term (current) use of anticoagulants: Secondary | ICD-10-CM | POA: Diagnosis not present

## 2021-10-19 DIAGNOSIS — Z9181 History of falling: Secondary | ICD-10-CM | POA: Diagnosis not present

## 2021-10-19 DIAGNOSIS — Z5181 Encounter for therapeutic drug level monitoring: Secondary | ICD-10-CM | POA: Diagnosis not present

## 2021-10-19 DIAGNOSIS — R2681 Unsteadiness on feet: Secondary | ICD-10-CM | POA: Diagnosis not present

## 2021-10-19 DIAGNOSIS — Z8673 Personal history of transient ischemic attack (TIA), and cerebral infarction without residual deficits: Secondary | ICD-10-CM | POA: Diagnosis not present

## 2021-10-19 NOTE — Telephone Encounter (Signed)
Continue coumadin as is °

## 2021-10-19 NOTE — Telephone Encounter (Signed)
INR 2.1 from Kim at [facility] for any new orders [phone]

## 2021-10-20 DIAGNOSIS — Z7901 Long term (current) use of anticoagulants: Secondary | ICD-10-CM | POA: Diagnosis not present

## 2021-10-20 DIAGNOSIS — Z952 Presence of prosthetic heart valve: Secondary | ICD-10-CM | POA: Diagnosis not present

## 2021-10-20 DIAGNOSIS — R2681 Unsteadiness on feet: Secondary | ICD-10-CM | POA: Diagnosis not present

## 2021-10-20 DIAGNOSIS — Z5181 Encounter for therapeutic drug level monitoring: Secondary | ICD-10-CM | POA: Diagnosis not present

## 2021-10-20 DIAGNOSIS — I482 Chronic atrial fibrillation, unspecified: Secondary | ICD-10-CM | POA: Diagnosis not present

## 2021-10-20 DIAGNOSIS — Z950 Presence of cardiac pacemaker: Secondary | ICD-10-CM | POA: Diagnosis not present

## 2021-10-20 DIAGNOSIS — F0390 Unspecified dementia without behavioral disturbance: Secondary | ICD-10-CM | POA: Diagnosis not present

## 2021-10-20 DIAGNOSIS — Z8673 Personal history of transient ischemic attack (TIA), and cerebral infarction without residual deficits: Secondary | ICD-10-CM | POA: Diagnosis not present

## 2021-10-20 DIAGNOSIS — Z9181 History of falling: Secondary | ICD-10-CM | POA: Diagnosis not present

## 2021-10-20 NOTE — Telephone Encounter (Signed)
Called NP with no verbal answer - I left a detailed VM On manager line that there would be no changes - to cont dose as it until rechecked

## 2021-10-20 NOTE — Telephone Encounter (Signed)
LMOVM to Selena Batten w/ [facility]en [facility] that there are no changes in pt's coumadin Copy of this telephone encounter has been faxed to [facility]

## 2021-10-21 DIAGNOSIS — Z5181 Encounter for therapeutic drug level monitoring: Secondary | ICD-10-CM | POA: Diagnosis not present

## 2021-10-21 DIAGNOSIS — Z9181 History of falling: Secondary | ICD-10-CM | POA: Diagnosis not present

## 2021-10-21 DIAGNOSIS — Z8673 Personal history of transient ischemic attack (TIA), and cerebral infarction without residual deficits: Secondary | ICD-10-CM | POA: Diagnosis not present

## 2021-10-21 DIAGNOSIS — Z950 Presence of cardiac pacemaker: Secondary | ICD-10-CM | POA: Diagnosis not present

## 2021-10-21 DIAGNOSIS — F0390 Unspecified dementia without behavioral disturbance: Secondary | ICD-10-CM | POA: Diagnosis not present

## 2021-10-21 DIAGNOSIS — I482 Chronic atrial fibrillation, unspecified: Secondary | ICD-10-CM | POA: Diagnosis not present

## 2021-10-21 DIAGNOSIS — Z7901 Long term (current) use of anticoagulants: Secondary | ICD-10-CM | POA: Diagnosis not present

## 2021-10-21 DIAGNOSIS — Z952 Presence of prosthetic heart valve: Secondary | ICD-10-CM | POA: Diagnosis not present

## 2021-10-21 DIAGNOSIS — R2681 Unsteadiness on feet: Secondary | ICD-10-CM | POA: Diagnosis not present

## 2021-10-27 ENCOUNTER — Telehealth: Payer: Self-pay | Admitting: *Deleted

## 2021-10-27 DIAGNOSIS — Z950 Presence of cardiac pacemaker: Secondary | ICD-10-CM | POA: Diagnosis not present

## 2021-10-27 DIAGNOSIS — Z952 Presence of prosthetic heart valve: Secondary | ICD-10-CM | POA: Diagnosis not present

## 2021-10-27 DIAGNOSIS — Z7901 Long term (current) use of anticoagulants: Secondary | ICD-10-CM | POA: Diagnosis not present

## 2021-10-27 DIAGNOSIS — Z9181 History of falling: Secondary | ICD-10-CM | POA: Diagnosis not present

## 2021-10-27 DIAGNOSIS — I482 Chronic atrial fibrillation, unspecified: Secondary | ICD-10-CM | POA: Diagnosis not present

## 2021-10-27 DIAGNOSIS — Z8673 Personal history of transient ischemic attack (TIA), and cerebral infarction without residual deficits: Secondary | ICD-10-CM | POA: Diagnosis not present

## 2021-10-27 DIAGNOSIS — F0390 Unspecified dementia without behavioral disturbance: Secondary | ICD-10-CM | POA: Diagnosis not present

## 2021-10-27 DIAGNOSIS — R2681 Unsteadiness on feet: Secondary | ICD-10-CM | POA: Diagnosis not present

## 2021-10-27 DIAGNOSIS — Z5181 Encounter for therapeutic drug level monitoring: Secondary | ICD-10-CM | POA: Diagnosis not present

## 2021-10-27 NOTE — Telephone Encounter (Signed)
VM from Selena Batten w/ [facility] Pt's INR today was 1.5   Please advise and call Selena Batten back [phone] And any changes need to be faxed to [facility] @ [phone]

## 2021-10-27 NOTE — Telephone Encounter (Signed)
Attempted to call Selena Batten to verify current warfarin dosing. I am not here tomorrow, will route to PCP.

## 2021-10-28 DIAGNOSIS — Z5181 Encounter for therapeutic drug level monitoring: Secondary | ICD-10-CM | POA: Diagnosis not present

## 2021-10-28 DIAGNOSIS — Z950 Presence of cardiac pacemaker: Secondary | ICD-10-CM | POA: Diagnosis not present

## 2021-10-28 DIAGNOSIS — F0390 Unspecified dementia without behavioral disturbance: Secondary | ICD-10-CM | POA: Diagnosis not present

## 2021-10-28 DIAGNOSIS — Z952 Presence of prosthetic heart valve: Secondary | ICD-10-CM | POA: Diagnosis not present

## 2021-10-28 DIAGNOSIS — I482 Chronic atrial fibrillation, unspecified: Secondary | ICD-10-CM | POA: Diagnosis not present

## 2021-10-28 DIAGNOSIS — Z7901 Long term (current) use of anticoagulants: Secondary | ICD-10-CM | POA: Diagnosis not present

## 2021-10-28 DIAGNOSIS — Z9181 History of falling: Secondary | ICD-10-CM | POA: Diagnosis not present

## 2021-10-28 DIAGNOSIS — R2681 Unsteadiness on feet: Secondary | ICD-10-CM | POA: Diagnosis not present

## 2021-10-28 DIAGNOSIS — Z8673 Personal history of transient ischemic attack (TIA), and cerebral infarction without residual deficits: Secondary | ICD-10-CM | POA: Diagnosis not present

## 2021-10-28 NOTE — Telephone Encounter (Signed)
Left message informing [name].

## 2021-10-28 NOTE — Telephone Encounter (Signed)
Continue coumadin as is. (Due to hx, I prefer it to run a bit low anyway. POC is in agreement) Recheck Next Wednesday.

## 2021-10-30 ENCOUNTER — Other Ambulatory Visit: Payer: Self-pay | Admitting: Family Medicine

## 2021-11-03 DIAGNOSIS — Z9181 History of falling: Secondary | ICD-10-CM | POA: Diagnosis not present

## 2021-11-03 DIAGNOSIS — I482 Chronic atrial fibrillation, unspecified: Secondary | ICD-10-CM | POA: Diagnosis not present

## 2021-11-03 DIAGNOSIS — R2681 Unsteadiness on feet: Secondary | ICD-10-CM | POA: Diagnosis not present

## 2021-11-03 DIAGNOSIS — Z5181 Encounter for therapeutic drug level monitoring: Secondary | ICD-10-CM | POA: Diagnosis not present

## 2021-11-03 DIAGNOSIS — Z7901 Long term (current) use of anticoagulants: Secondary | ICD-10-CM | POA: Diagnosis not present

## 2021-11-03 DIAGNOSIS — F0390 Unspecified dementia without behavioral disturbance: Secondary | ICD-10-CM | POA: Diagnosis not present

## 2021-11-03 DIAGNOSIS — Z952 Presence of prosthetic heart valve: Secondary | ICD-10-CM | POA: Diagnosis not present

## 2021-11-03 DIAGNOSIS — Z950 Presence of cardiac pacemaker: Secondary | ICD-10-CM | POA: Diagnosis not present

## 2021-11-03 DIAGNOSIS — Z8673 Personal history of transient ischemic attack (TIA), and cerebral infarction without residual deficits: Secondary | ICD-10-CM | POA: Diagnosis not present

## 2021-11-04 DIAGNOSIS — Z952 Presence of prosthetic heart valve: Secondary | ICD-10-CM | POA: Diagnosis not present

## 2021-11-04 DIAGNOSIS — Z9181 History of falling: Secondary | ICD-10-CM | POA: Diagnosis not present

## 2021-11-04 DIAGNOSIS — R2681 Unsteadiness on feet: Secondary | ICD-10-CM | POA: Diagnosis not present

## 2021-11-04 DIAGNOSIS — Z7901 Long term (current) use of anticoagulants: Secondary | ICD-10-CM | POA: Diagnosis not present

## 2021-11-04 DIAGNOSIS — I482 Chronic atrial fibrillation, unspecified: Secondary | ICD-10-CM | POA: Diagnosis not present

## 2021-11-04 DIAGNOSIS — Z8673 Personal history of transient ischemic attack (TIA), and cerebral infarction without residual deficits: Secondary | ICD-10-CM | POA: Diagnosis not present

## 2021-11-04 DIAGNOSIS — F0390 Unspecified dementia without behavioral disturbance: Secondary | ICD-10-CM | POA: Diagnosis not present

## 2021-11-04 DIAGNOSIS — Z5181 Encounter for therapeutic drug level monitoring: Secondary | ICD-10-CM | POA: Diagnosis not present

## 2021-11-04 DIAGNOSIS — Z950 Presence of cardiac pacemaker: Secondary | ICD-10-CM | POA: Diagnosis not present

## 2021-11-08 DIAGNOSIS — F4322 Adjustment disorder with anxiety: Secondary | ICD-10-CM | POA: Diagnosis not present

## 2021-11-10 ENCOUNTER — Telehealth: Payer: Self-pay | Admitting: Family Medicine

## 2021-11-10 DIAGNOSIS — F0390 Unspecified dementia without behavioral disturbance: Secondary | ICD-10-CM | POA: Diagnosis not present

## 2021-11-10 DIAGNOSIS — Z8673 Personal history of transient ischemic attack (TIA), and cerebral infarction without residual deficits: Secondary | ICD-10-CM | POA: Diagnosis not present

## 2021-11-10 DIAGNOSIS — Z950 Presence of cardiac pacemaker: Secondary | ICD-10-CM | POA: Diagnosis not present

## 2021-11-10 DIAGNOSIS — Z9181 History of falling: Secondary | ICD-10-CM | POA: Diagnosis not present

## 2021-11-10 DIAGNOSIS — Z7901 Long term (current) use of anticoagulants: Secondary | ICD-10-CM | POA: Diagnosis not present

## 2021-11-10 DIAGNOSIS — Z5181 Encounter for therapeutic drug level monitoring: Secondary | ICD-10-CM | POA: Diagnosis not present

## 2021-11-10 DIAGNOSIS — R2681 Unsteadiness on feet: Secondary | ICD-10-CM | POA: Diagnosis not present

## 2021-11-10 DIAGNOSIS — Z952 Presence of prosthetic heart valve: Secondary | ICD-10-CM | POA: Diagnosis not present

## 2021-11-10 DIAGNOSIS — I482 Chronic atrial fibrillation, unspecified: Secondary | ICD-10-CM | POA: Diagnosis not present

## 2021-11-10 NOTE — Telephone Encounter (Signed)
Left message for Toni Amend to return call.  Does pt have any symptoms of concussion?  Does he want to be seen?  Did he already go to ER?

## 2021-11-16 ENCOUNTER — Telehealth: Payer: Self-pay | Admitting: *Deleted

## 2021-11-16 DIAGNOSIS — Z8673 Personal history of transient ischemic attack (TIA), and cerebral infarction without residual deficits: Secondary | ICD-10-CM | POA: Diagnosis not present

## 2021-11-16 DIAGNOSIS — Z950 Presence of cardiac pacemaker: Secondary | ICD-10-CM | POA: Diagnosis not present

## 2021-11-16 DIAGNOSIS — Z5181 Encounter for therapeutic drug level monitoring: Secondary | ICD-10-CM | POA: Diagnosis not present

## 2021-11-16 DIAGNOSIS — Z952 Presence of prosthetic heart valve: Secondary | ICD-10-CM | POA: Diagnosis not present

## 2021-11-16 DIAGNOSIS — Z9181 History of falling: Secondary | ICD-10-CM | POA: Diagnosis not present

## 2021-11-16 DIAGNOSIS — F0390 Unspecified dementia without behavioral disturbance: Secondary | ICD-10-CM | POA: Diagnosis not present

## 2021-11-16 DIAGNOSIS — I482 Chronic atrial fibrillation, unspecified: Secondary | ICD-10-CM | POA: Diagnosis not present

## 2021-11-16 DIAGNOSIS — Z7901 Long term (current) use of anticoagulants: Secondary | ICD-10-CM | POA: Diagnosis not present

## 2021-11-16 DIAGNOSIS — R2681 Unsteadiness on feet: Secondary | ICD-10-CM | POA: Diagnosis not present

## 2021-11-16 NOTE — Telephone Encounter (Signed)
Call from Selena Batten w/ [facility] Pt's INR today was 2.4   Please advise and call Selena Batten back [phone] And any changes need to be faxed to [facility] @ [phone]

## 2021-11-16 NOTE — Telephone Encounter (Signed)
Continue coumadin as is °

## 2021-11-16 NOTE — Telephone Encounter (Signed)
Faxed to [facility] 

## 2021-11-16 NOTE — Telephone Encounter (Signed)
Left detailed message told to call back with any questions.

## 2021-11-17 ENCOUNTER — Ambulatory Visit (INDEPENDENT_AMBULATORY_CARE_PROVIDER_SITE_OTHER): Payer: Medicare HMO

## 2021-11-17 ENCOUNTER — Other Ambulatory Visit: Payer: Self-pay

## 2021-11-17 DIAGNOSIS — F0390 Unspecified dementia without behavioral disturbance: Secondary | ICD-10-CM | POA: Diagnosis not present

## 2021-11-17 DIAGNOSIS — Z5181 Encounter for therapeutic drug level monitoring: Secondary | ICD-10-CM

## 2021-11-17 DIAGNOSIS — I482 Chronic atrial fibrillation, unspecified: Secondary | ICD-10-CM

## 2021-11-17 DIAGNOSIS — N400 Enlarged prostate without lower urinary tract symptoms: Secondary | ICD-10-CM

## 2021-11-17 DIAGNOSIS — K219 Gastro-esophageal reflux disease without esophagitis: Secondary | ICD-10-CM

## 2021-11-17 DIAGNOSIS — R2681 Unsteadiness on feet: Secondary | ICD-10-CM

## 2021-11-17 DIAGNOSIS — Z7901 Long term (current) use of anticoagulants: Secondary | ICD-10-CM | POA: Diagnosis not present

## 2021-11-17 DIAGNOSIS — Z952 Presence of prosthetic heart valve: Secondary | ICD-10-CM | POA: Diagnosis not present

## 2021-11-17 DIAGNOSIS — H409 Unspecified glaucoma: Secondary | ICD-10-CM

## 2021-11-17 DIAGNOSIS — Z8673 Personal history of transient ischemic attack (TIA), and cerebral infarction without residual deficits: Secondary | ICD-10-CM

## 2021-11-17 DIAGNOSIS — F419 Anxiety disorder, unspecified: Secondary | ICD-10-CM

## 2021-11-17 DIAGNOSIS — E079 Disorder of thyroid, unspecified: Secondary | ICD-10-CM

## 2021-11-17 DIAGNOSIS — Z9181 History of falling: Secondary | ICD-10-CM | POA: Diagnosis not present

## 2021-11-17 DIAGNOSIS — Z950 Presence of cardiac pacemaker: Secondary | ICD-10-CM

## 2021-11-21 ENCOUNTER — Other Ambulatory Visit: Payer: Self-pay | Admitting: *Deleted

## 2021-11-21 MED ORDER — DULOXETINE HCL 20 MG PO CPEP: ORAL_CAPSULE | ORAL | 0 refills | Status: DC

## 2021-11-21 NOTE — Telephone Encounter (Signed)
Called [name] to verify he does want to use mail order pharmacy

## 2021-11-22 ENCOUNTER — Telehealth: Payer: Self-pay | Admitting: *Deleted

## 2021-11-22 DIAGNOSIS — Z952 Presence of prosthetic heart valve: Secondary | ICD-10-CM | POA: Diagnosis not present

## 2021-11-22 DIAGNOSIS — I482 Chronic atrial fibrillation, unspecified: Secondary | ICD-10-CM | POA: Diagnosis not present

## 2021-11-22 DIAGNOSIS — Z5181 Encounter for therapeutic drug level monitoring: Secondary | ICD-10-CM | POA: Diagnosis not present

## 2021-11-22 DIAGNOSIS — Z7901 Long term (current) use of anticoagulants: Secondary | ICD-10-CM | POA: Diagnosis not present

## 2021-11-22 DIAGNOSIS — R2681 Unsteadiness on feet: Secondary | ICD-10-CM | POA: Diagnosis not present

## 2021-11-22 DIAGNOSIS — Z9181 History of falling: Secondary | ICD-10-CM | POA: Diagnosis not present

## 2021-11-22 DIAGNOSIS — Z8673 Personal history of transient ischemic attack (TIA), and cerebral infarction without residual deficits: Secondary | ICD-10-CM | POA: Diagnosis not present

## 2021-11-22 DIAGNOSIS — F0390 Unspecified dementia without behavioral disturbance: Secondary | ICD-10-CM | POA: Diagnosis not present

## 2021-11-22 DIAGNOSIS — Z950 Presence of cardiac pacemaker: Secondary | ICD-10-CM | POA: Diagnosis not present

## 2021-11-22 NOTE — Telephone Encounter (Signed)
Hold 2 days and then go to 2 mg a day

## 2021-11-22 NOTE — Telephone Encounter (Signed)
Call from Selena Batten w/ [facility] Pt's INR today was 5.8 Current dose: alternating 2 mg & 3 mg   Please advise and call Selena Batten back [phone] And any changes need to be faxed to [facility] @ [phone]

## 2021-11-23 ENCOUNTER — Telehealth: Payer: Self-pay | Admitting: Family Medicine

## 2021-11-23 NOTE — Telephone Encounter (Signed)
Copy of telephone encounter faxed to [facility]

## 2021-11-23 NOTE — Telephone Encounter (Signed)
Clarified with Dr. Livia Snellen Hold 2 days and then go to 2 mg a day until next INR check next week Will not DC 3 mg coumadin at this time, may need to alternate doses again at a later date Telephone encounter printed and faxed to [facility].

## 2021-11-23 NOTE — Telephone Encounter (Signed)
TC to [facility], they received fax & Selena Batten w/ [facility] aware

## 2021-11-23 NOTE — Telephone Encounter (Signed)
[name] is calling to speak with NP and to speak with Dr Darlyn Read.

## 2021-11-23 NOTE — Telephone Encounter (Signed)
LVM with Selena Batten (home health) advising of provider feedback and will fax order to [facility].

## 2021-11-29 ENCOUNTER — Telehealth: Payer: Self-pay | Admitting: *Deleted

## 2021-11-29 DIAGNOSIS — Z7901 Long term (current) use of anticoagulants: Secondary | ICD-10-CM | POA: Diagnosis not present

## 2021-11-29 DIAGNOSIS — Z952 Presence of prosthetic heart valve: Secondary | ICD-10-CM | POA: Diagnosis not present

## 2021-11-29 DIAGNOSIS — Z950 Presence of cardiac pacemaker: Secondary | ICD-10-CM | POA: Diagnosis not present

## 2021-11-29 DIAGNOSIS — R2681 Unsteadiness on feet: Secondary | ICD-10-CM | POA: Diagnosis not present

## 2021-11-29 DIAGNOSIS — Z9181 History of falling: Secondary | ICD-10-CM | POA: Diagnosis not present

## 2021-11-29 DIAGNOSIS — I482 Chronic atrial fibrillation, unspecified: Secondary | ICD-10-CM | POA: Diagnosis not present

## 2021-11-29 DIAGNOSIS — F0390 Unspecified dementia without behavioral disturbance: Secondary | ICD-10-CM | POA: Diagnosis not present

## 2021-11-29 DIAGNOSIS — Z5181 Encounter for therapeutic drug level monitoring: Secondary | ICD-10-CM | POA: Diagnosis not present

## 2021-11-29 DIAGNOSIS — Z8673 Personal history of transient ischemic attack (TIA), and cerebral infarction without residual deficits: Secondary | ICD-10-CM | POA: Diagnosis not present

## 2021-11-29 NOTE — Telephone Encounter (Signed)
Yes, please repeat INR with Blood draw. Just curious why were the orders from last week not carried out. He could have had a dangerous bleed?

## 2021-11-29 NOTE — Telephone Encounter (Signed)
Call from Selena Batten w/ [facility] Pt's INR today was 1.9 Current dose: alternating 2 mg & 3 mg  Kim talked w/ Solicitor at [employer] w/ changes to dose from last week were received but they were not changed in their [facility] and pt continued to receive alternating dose of 2 mg & 3 mg. Instead of change to hold 2 days and then go to 2 mg a day.  Selena Batten is concerned since there was no change in dose, his INR should be close to the result from last week of 5.8 She can do a blood draw today if you would like her to   Please advise and call Selena Batten back [phone] And any changes need to be faxed to [facility] @ [phone]

## 2021-11-29 NOTE — Telephone Encounter (Signed)
[name] HH aware to do a blood draw INR, she will do tomorrow morning, send to [facility] have back on Thursday.

## 2021-11-30 DIAGNOSIS — Z8673 Personal history of transient ischemic attack (TIA), and cerebral infarction without residual deficits: Secondary | ICD-10-CM | POA: Diagnosis not present

## 2021-11-30 DIAGNOSIS — I482 Chronic atrial fibrillation, unspecified: Secondary | ICD-10-CM | POA: Diagnosis not present

## 2021-11-30 DIAGNOSIS — Z7901 Long term (current) use of anticoagulants: Secondary | ICD-10-CM | POA: Diagnosis not present

## 2021-11-30 DIAGNOSIS — F0390 Unspecified dementia without behavioral disturbance: Secondary | ICD-10-CM | POA: Diagnosis not present

## 2021-11-30 DIAGNOSIS — Z952 Presence of prosthetic heart valve: Secondary | ICD-10-CM | POA: Diagnosis not present

## 2021-11-30 DIAGNOSIS — R2681 Unsteadiness on feet: Secondary | ICD-10-CM | POA: Diagnosis not present

## 2021-11-30 DIAGNOSIS — Z950 Presence of cardiac pacemaker: Secondary | ICD-10-CM | POA: Diagnosis not present

## 2021-11-30 DIAGNOSIS — Z5181 Encounter for therapeutic drug level monitoring: Secondary | ICD-10-CM | POA: Diagnosis not present

## 2021-11-30 DIAGNOSIS — Z9181 History of falling: Secondary | ICD-10-CM | POA: Diagnosis not present

## 2021-12-01 DIAGNOSIS — Z9181 History of falling: Secondary | ICD-10-CM | POA: Diagnosis not present

## 2021-12-01 DIAGNOSIS — Z7901 Long term (current) use of anticoagulants: Secondary | ICD-10-CM | POA: Diagnosis not present

## 2021-12-01 DIAGNOSIS — F0390 Unspecified dementia without behavioral disturbance: Secondary | ICD-10-CM | POA: Diagnosis not present

## 2021-12-01 DIAGNOSIS — Z950 Presence of cardiac pacemaker: Secondary | ICD-10-CM | POA: Diagnosis not present

## 2021-12-01 DIAGNOSIS — I482 Chronic atrial fibrillation, unspecified: Secondary | ICD-10-CM | POA: Diagnosis not present

## 2021-12-01 DIAGNOSIS — Z8673 Personal history of transient ischemic attack (TIA), and cerebral infarction without residual deficits: Secondary | ICD-10-CM | POA: Diagnosis not present

## 2021-12-01 DIAGNOSIS — Z5181 Encounter for therapeutic drug level monitoring: Secondary | ICD-10-CM | POA: Diagnosis not present

## 2021-12-01 DIAGNOSIS — Z952 Presence of prosthetic heart valve: Secondary | ICD-10-CM | POA: Diagnosis not present

## 2021-12-01 DIAGNOSIS — R2681 Unsteadiness on feet: Secondary | ICD-10-CM | POA: Diagnosis not present

## 2021-12-05 ENCOUNTER — Telehealth: Payer: Self-pay | Admitting: *Deleted

## 2021-12-05 DIAGNOSIS — I482 Chronic atrial fibrillation, unspecified: Secondary | ICD-10-CM | POA: Diagnosis not present

## 2021-12-05 DIAGNOSIS — Z5181 Encounter for therapeutic drug level monitoring: Secondary | ICD-10-CM | POA: Diagnosis not present

## 2021-12-05 DIAGNOSIS — Z8673 Personal history of transient ischemic attack (TIA), and cerebral infarction without residual deficits: Secondary | ICD-10-CM | POA: Diagnosis not present

## 2021-12-05 DIAGNOSIS — F0390 Unspecified dementia without behavioral disturbance: Secondary | ICD-10-CM | POA: Diagnosis not present

## 2021-12-05 DIAGNOSIS — Z950 Presence of cardiac pacemaker: Secondary | ICD-10-CM | POA: Diagnosis not present

## 2021-12-05 DIAGNOSIS — Z9181 History of falling: Secondary | ICD-10-CM | POA: Diagnosis not present

## 2021-12-05 DIAGNOSIS — R2681 Unsteadiness on feet: Secondary | ICD-10-CM | POA: Diagnosis not present

## 2021-12-05 DIAGNOSIS — Z7901 Long term (current) use of anticoagulants: Secondary | ICD-10-CM | POA: Diagnosis not present

## 2021-12-05 DIAGNOSIS — Z952 Presence of prosthetic heart valve: Secondary | ICD-10-CM | POA: Diagnosis not present

## 2021-12-05 NOTE — Telephone Encounter (Signed)
TC to [name] to continue coumadin as is, alternating 2 mg & 3 mg Faxed to [facility]

## 2021-12-05 NOTE — Telephone Encounter (Signed)
Continue coumadin as is °

## 2021-12-05 NOTE — Telephone Encounter (Signed)
Call from Kathlee Nations w/ [facility] Pt's INR today was 1.5 Blooddraw INR last week was 1.6 on the 19th Current dose: alternating 2 mg & 3 mg   Please advise and call Kathlee Nations back [phone]  And any changes need to be faxed to [facility] @ [phone], please call [facility] [phone] to confirm fax was received.

## 2021-12-13 DIAGNOSIS — R2681 Unsteadiness on feet: Secondary | ICD-10-CM | POA: Diagnosis not present

## 2021-12-13 DIAGNOSIS — F0390 Unspecified dementia without behavioral disturbance: Secondary | ICD-10-CM | POA: Diagnosis not present

## 2021-12-13 DIAGNOSIS — Z5181 Encounter for therapeutic drug level monitoring: Secondary | ICD-10-CM | POA: Diagnosis not present

## 2021-12-13 DIAGNOSIS — Z952 Presence of prosthetic heart valve: Secondary | ICD-10-CM | POA: Diagnosis not present

## 2021-12-13 DIAGNOSIS — Z9181 History of falling: Secondary | ICD-10-CM | POA: Diagnosis not present

## 2021-12-13 DIAGNOSIS — I482 Chronic atrial fibrillation, unspecified: Secondary | ICD-10-CM | POA: Diagnosis not present

## 2021-12-13 DIAGNOSIS — Z7901 Long term (current) use of anticoagulants: Secondary | ICD-10-CM | POA: Diagnosis not present

## 2021-12-13 DIAGNOSIS — Z8673 Personal history of transient ischemic attack (TIA), and cerebral infarction without residual deficits: Secondary | ICD-10-CM | POA: Diagnosis not present

## 2021-12-13 DIAGNOSIS — Z950 Presence of cardiac pacemaker: Secondary | ICD-10-CM | POA: Diagnosis not present

## 2021-12-20 ENCOUNTER — Telehealth: Payer: Self-pay

## 2021-12-20 DIAGNOSIS — Z7901 Long term (current) use of anticoagulants: Secondary | ICD-10-CM | POA: Diagnosis not present

## 2021-12-20 DIAGNOSIS — Z950 Presence of cardiac pacemaker: Secondary | ICD-10-CM | POA: Diagnosis not present

## 2021-12-20 DIAGNOSIS — Z5181 Encounter for therapeutic drug level monitoring: Secondary | ICD-10-CM | POA: Diagnosis not present

## 2021-12-20 DIAGNOSIS — Z9181 History of falling: Secondary | ICD-10-CM | POA: Diagnosis not present

## 2021-12-20 DIAGNOSIS — Z952 Presence of prosthetic heart valve: Secondary | ICD-10-CM | POA: Diagnosis not present

## 2021-12-20 DIAGNOSIS — R2681 Unsteadiness on feet: Secondary | ICD-10-CM | POA: Diagnosis not present

## 2021-12-20 DIAGNOSIS — F0390 Unspecified dementia without behavioral disturbance: Secondary | ICD-10-CM | POA: Diagnosis not present

## 2021-12-20 DIAGNOSIS — I482 Chronic atrial fibrillation, unspecified: Secondary | ICD-10-CM | POA: Diagnosis not present

## 2021-12-20 DIAGNOSIS — Z8673 Personal history of transient ischemic attack (TIA), and cerebral infarction without residual deficits: Secondary | ICD-10-CM | POA: Diagnosis not present

## 2021-12-20 NOTE — Telephone Encounter (Signed)
Description   INR 2.0 today- At goal  Continue 1 mg daily. Recheck in 1 week.

## 2021-12-20 NOTE — Telephone Encounter (Signed)
KIM aware and verbalized understanding.

## 2021-12-20 NOTE — Telephone Encounter (Signed)
Fax received mdINR PT/INR self testing service Test date/time 12/20/21 12:15 pm INR 2.0  Patient alternating between 2 mg and 3 mg every other day.

## 2021-12-27 DIAGNOSIS — I482 Chronic atrial fibrillation, unspecified: Secondary | ICD-10-CM | POA: Diagnosis not present

## 2021-12-27 DIAGNOSIS — Z9181 History of falling: Secondary | ICD-10-CM | POA: Diagnosis not present

## 2021-12-27 DIAGNOSIS — Z8673 Personal history of transient ischemic attack (TIA), and cerebral infarction without residual deficits: Secondary | ICD-10-CM | POA: Diagnosis not present

## 2021-12-27 DIAGNOSIS — R2681 Unsteadiness on feet: Secondary | ICD-10-CM | POA: Diagnosis not present

## 2021-12-27 DIAGNOSIS — Z5181 Encounter for therapeutic drug level monitoring: Secondary | ICD-10-CM | POA: Diagnosis not present

## 2021-12-27 DIAGNOSIS — F0390 Unspecified dementia without behavioral disturbance: Secondary | ICD-10-CM | POA: Diagnosis not present

## 2021-12-27 DIAGNOSIS — Z950 Presence of cardiac pacemaker: Secondary | ICD-10-CM | POA: Diagnosis not present

## 2021-12-27 DIAGNOSIS — Z7901 Long term (current) use of anticoagulants: Secondary | ICD-10-CM | POA: Diagnosis not present

## 2021-12-27 DIAGNOSIS — Z952 Presence of prosthetic heart valve: Secondary | ICD-10-CM | POA: Diagnosis not present

## 2021-12-28 DIAGNOSIS — F4322 Adjustment disorder with anxiety: Secondary | ICD-10-CM | POA: Diagnosis not present

## 2022-01-06 ENCOUNTER — Telehealth: Payer: Self-pay | Admitting: *Deleted

## 2022-01-06 DIAGNOSIS — Z952 Presence of prosthetic heart valve: Secondary | ICD-10-CM

## 2022-01-06 DIAGNOSIS — F0390 Unspecified dementia without behavioral disturbance: Secondary | ICD-10-CM | POA: Diagnosis not present

## 2022-01-06 DIAGNOSIS — Z950 Presence of cardiac pacemaker: Secondary | ICD-10-CM | POA: Diagnosis not present

## 2022-01-06 DIAGNOSIS — R2681 Unsteadiness on feet: Secondary | ICD-10-CM | POA: Diagnosis not present

## 2022-01-06 DIAGNOSIS — Z7901 Long term (current) use of anticoagulants: Secondary | ICD-10-CM | POA: Diagnosis not present

## 2022-01-06 DIAGNOSIS — Z8673 Personal history of transient ischemic attack (TIA), and cerebral infarction without residual deficits: Secondary | ICD-10-CM | POA: Diagnosis not present

## 2022-01-06 DIAGNOSIS — I482 Chronic atrial fibrillation, unspecified: Secondary | ICD-10-CM | POA: Diagnosis not present

## 2022-01-06 DIAGNOSIS — Z9181 History of falling: Secondary | ICD-10-CM | POA: Diagnosis not present

## 2022-01-06 DIAGNOSIS — Z5181 Encounter for therapeutic drug level monitoring: Secondary | ICD-10-CM | POA: Diagnosis not present

## 2022-01-06 NOTE — Telephone Encounter (Signed)
LMOM for Christy at [employer]. Faxed [place] and spoke to Milton at [employer] on phone

## 2022-01-06 NOTE — Telephone Encounter (Signed)
Description   INR 3.4 today-goal 1.8-2.2 Diagnosis valvular A-fib  Hold for 2 days and then go back to alternating between 3 and 2 mg tablets, I do not know why it is up just this week.  Recheck in 3-4 days.     Arville Care, MD [facility] 01/06/2022, 12:43 PM

## 2022-01-06 NOTE — Telephone Encounter (Signed)
TC from [place] w/ [facility] INR today 3.8 [facility] last note alternating 2 mg & 3 mg  Please advise and call Christy back [phone] And any changes need to be faxed to [facility] @ [phone] Print the telephone encounter and this can be faxed to [facility] please call [facility] [phone] to confirm fax was received.

## 2022-01-10 DIAGNOSIS — Z950 Presence of cardiac pacemaker: Secondary | ICD-10-CM | POA: Diagnosis not present

## 2022-01-10 DIAGNOSIS — F0390 Unspecified dementia without behavioral disturbance: Secondary | ICD-10-CM | POA: Diagnosis not present

## 2022-01-10 DIAGNOSIS — Z8673 Personal history of transient ischemic attack (TIA), and cerebral infarction without residual deficits: Secondary | ICD-10-CM | POA: Diagnosis not present

## 2022-01-10 DIAGNOSIS — Z9181 History of falling: Secondary | ICD-10-CM | POA: Diagnosis not present

## 2022-01-10 DIAGNOSIS — Z7901 Long term (current) use of anticoagulants: Secondary | ICD-10-CM | POA: Diagnosis not present

## 2022-01-10 DIAGNOSIS — Z5181 Encounter for therapeutic drug level monitoring: Secondary | ICD-10-CM | POA: Diagnosis not present

## 2022-01-10 DIAGNOSIS — Z952 Presence of prosthetic heart valve: Secondary | ICD-10-CM | POA: Diagnosis not present

## 2022-01-10 DIAGNOSIS — I482 Chronic atrial fibrillation, unspecified: Secondary | ICD-10-CM | POA: Diagnosis not present

## 2022-01-10 DIAGNOSIS — R2681 Unsteadiness on feet: Secondary | ICD-10-CM | POA: Diagnosis not present

## 2022-01-17 DIAGNOSIS — Z950 Presence of cardiac pacemaker: Secondary | ICD-10-CM | POA: Diagnosis not present

## 2022-01-17 DIAGNOSIS — R2681 Unsteadiness on feet: Secondary | ICD-10-CM | POA: Diagnosis not present

## 2022-01-17 DIAGNOSIS — Z7901 Long term (current) use of anticoagulants: Secondary | ICD-10-CM | POA: Diagnosis not present

## 2022-01-17 DIAGNOSIS — I482 Chronic atrial fibrillation, unspecified: Secondary | ICD-10-CM | POA: Diagnosis not present

## 2022-01-17 DIAGNOSIS — Z5181 Encounter for therapeutic drug level monitoring: Secondary | ICD-10-CM | POA: Diagnosis not present

## 2022-01-17 DIAGNOSIS — Z952 Presence of prosthetic heart valve: Secondary | ICD-10-CM | POA: Diagnosis not present

## 2022-01-17 DIAGNOSIS — F0390 Unspecified dementia without behavioral disturbance: Secondary | ICD-10-CM | POA: Diagnosis not present

## 2022-01-17 DIAGNOSIS — Z9181 History of falling: Secondary | ICD-10-CM | POA: Diagnosis not present

## 2022-01-17 DIAGNOSIS — Z8673 Personal history of transient ischemic attack (TIA), and cerebral infarction without residual deficits: Secondary | ICD-10-CM | POA: Diagnosis not present

## 2022-01-24 ENCOUNTER — Other Ambulatory Visit: Payer: Self-pay | Admitting: Family Medicine

## 2022-01-24 DIAGNOSIS — Z5181 Encounter for therapeutic drug level monitoring: Secondary | ICD-10-CM | POA: Diagnosis not present

## 2022-01-24 DIAGNOSIS — Z8673 Personal history of transient ischemic attack (TIA), and cerebral infarction without residual deficits: Secondary | ICD-10-CM | POA: Diagnosis not present

## 2022-01-24 DIAGNOSIS — Z7901 Long term (current) use of anticoagulants: Secondary | ICD-10-CM | POA: Diagnosis not present

## 2022-01-24 DIAGNOSIS — Z9181 History of falling: Secondary | ICD-10-CM | POA: Diagnosis not present

## 2022-01-24 DIAGNOSIS — R2681 Unsteadiness on feet: Secondary | ICD-10-CM | POA: Diagnosis not present

## 2022-01-24 DIAGNOSIS — N400 Enlarged prostate without lower urinary tract symptoms: Secondary | ICD-10-CM

## 2022-01-24 DIAGNOSIS — I482 Chronic atrial fibrillation, unspecified: Secondary | ICD-10-CM | POA: Diagnosis not present

## 2022-01-24 DIAGNOSIS — Z952 Presence of prosthetic heart valve: Secondary | ICD-10-CM | POA: Diagnosis not present

## 2022-01-24 DIAGNOSIS — Z950 Presence of cardiac pacemaker: Secondary | ICD-10-CM | POA: Diagnosis not present

## 2022-01-24 DIAGNOSIS — F0390 Unspecified dementia without behavioral disturbance: Secondary | ICD-10-CM | POA: Diagnosis not present

## 2022-02-01 ENCOUNTER — Telehealth: Payer: Self-pay

## 2022-02-01 DIAGNOSIS — R2681 Unsteadiness on feet: Secondary | ICD-10-CM | POA: Diagnosis not present

## 2022-02-01 DIAGNOSIS — Z7901 Long term (current) use of anticoagulants: Secondary | ICD-10-CM | POA: Diagnosis not present

## 2022-02-01 DIAGNOSIS — F0390 Unspecified dementia without behavioral disturbance: Secondary | ICD-10-CM | POA: Diagnosis not present

## 2022-02-01 DIAGNOSIS — Z5181 Encounter for therapeutic drug level monitoring: Secondary | ICD-10-CM | POA: Diagnosis not present

## 2022-02-01 DIAGNOSIS — Z8673 Personal history of transient ischemic attack (TIA), and cerebral infarction without residual deficits: Secondary | ICD-10-CM | POA: Diagnosis not present

## 2022-02-01 DIAGNOSIS — I482 Chronic atrial fibrillation, unspecified: Secondary | ICD-10-CM | POA: Diagnosis not present

## 2022-02-01 DIAGNOSIS — Z9181 History of falling: Secondary | ICD-10-CM | POA: Diagnosis not present

## 2022-02-01 DIAGNOSIS — Z950 Presence of cardiac pacemaker: Secondary | ICD-10-CM | POA: Diagnosis not present

## 2022-02-01 DIAGNOSIS — Z952 Presence of prosthetic heart valve: Secondary | ICD-10-CM | POA: Diagnosis not present

## 2022-02-01 NOTE — Telephone Encounter (Signed)
TC from [place] w/ [facility] ?INR today 3.7 ?Last note alternating 2 mg & 3 mg ? ?Please review and advise. Call instructions to [phone] ?

## 2022-02-01 NOTE — Telephone Encounter (Signed)
Hold coumadin for one day. Then resume at 2 mg dauly. ?

## 2022-02-02 NOTE — Telephone Encounter (Signed)
Christy w/ [facility] aware to [facility] Coumadin for one day, then resume 2 mg daily. Weekly INR check ?TC encounter printed and faxed to [facility] for order changes. ?

## 2022-02-03 ENCOUNTER — Other Ambulatory Visit: Payer: Self-pay | Admitting: Family Medicine

## 2022-02-03 DIAGNOSIS — G301 Alzheimer's disease with late onset: Secondary | ICD-10-CM

## 2022-02-03 DIAGNOSIS — F0150 Vascular dementia without behavioral disturbance: Secondary | ICD-10-CM

## 2022-02-08 ENCOUNTER — Ambulatory Visit (INDEPENDENT_AMBULATORY_CARE_PROVIDER_SITE_OTHER): Payer: Medicare HMO

## 2022-02-08 DIAGNOSIS — F0390 Unspecified dementia without behavioral disturbance: Secondary | ICD-10-CM

## 2022-02-08 DIAGNOSIS — H409 Unspecified glaucoma: Secondary | ICD-10-CM

## 2022-02-08 DIAGNOSIS — Z9181 History of falling: Secondary | ICD-10-CM

## 2022-02-08 DIAGNOSIS — Z952 Presence of prosthetic heart valve: Secondary | ICD-10-CM | POA: Diagnosis not present

## 2022-02-08 DIAGNOSIS — K219 Gastro-esophageal reflux disease without esophagitis: Secondary | ICD-10-CM

## 2022-02-08 DIAGNOSIS — F419 Anxiety disorder, unspecified: Secondary | ICD-10-CM | POA: Diagnosis not present

## 2022-02-08 DIAGNOSIS — N400 Enlarged prostate without lower urinary tract symptoms: Secondary | ICD-10-CM

## 2022-02-08 DIAGNOSIS — R2681 Unsteadiness on feet: Secondary | ICD-10-CM

## 2022-02-08 DIAGNOSIS — I482 Chronic atrial fibrillation, unspecified: Secondary | ICD-10-CM

## 2022-02-08 DIAGNOSIS — Z950 Presence of cardiac pacemaker: Secondary | ICD-10-CM

## 2022-02-08 DIAGNOSIS — Z5181 Encounter for therapeutic drug level monitoring: Secondary | ICD-10-CM | POA: Diagnosis not present

## 2022-02-08 DIAGNOSIS — Z8673 Personal history of transient ischemic attack (TIA), and cerebral infarction without residual deficits: Secondary | ICD-10-CM | POA: Diagnosis not present

## 2022-02-09 ENCOUNTER — Telehealth: Payer: Self-pay | Admitting: *Deleted

## 2022-02-09 DIAGNOSIS — Z950 Presence of cardiac pacemaker: Secondary | ICD-10-CM | POA: Diagnosis not present

## 2022-02-09 DIAGNOSIS — Z9181 History of falling: Secondary | ICD-10-CM | POA: Diagnosis not present

## 2022-02-09 DIAGNOSIS — Z7901 Long term (current) use of anticoagulants: Secondary | ICD-10-CM | POA: Diagnosis not present

## 2022-02-09 DIAGNOSIS — Z5181 Encounter for therapeutic drug level monitoring: Secondary | ICD-10-CM | POA: Diagnosis not present

## 2022-02-09 DIAGNOSIS — I482 Chronic atrial fibrillation, unspecified: Secondary | ICD-10-CM | POA: Diagnosis not present

## 2022-02-09 DIAGNOSIS — Z8673 Personal history of transient ischemic attack (TIA), and cerebral infarction without residual deficits: Secondary | ICD-10-CM | POA: Diagnosis not present

## 2022-02-09 DIAGNOSIS — F0390 Unspecified dementia without behavioral disturbance: Secondary | ICD-10-CM | POA: Diagnosis not present

## 2022-02-09 DIAGNOSIS — R2681 Unsteadiness on feet: Secondary | ICD-10-CM | POA: Diagnosis not present

## 2022-02-09 DIAGNOSIS — Z952 Presence of prosthetic heart valve: Secondary | ICD-10-CM | POA: Diagnosis not present

## 2022-02-09 NOTE — Telephone Encounter (Signed)
Indication: chronic afib ?Goal INR: 2-3 ?Current regimen: 2 mg alternating with 3 mg ?INR supratherapeutic at 5.9 ? ?Recommendations:  ?Skip Coumadin today and tomorrow.  Resume Saturday with 2 mg daily and then recheck INR next week ? ?

## 2022-02-09 NOTE — Telephone Encounter (Signed)
Call from [place] w/ [facility] ?Pt's INR today was 5.9 ? ?[facility] did not change dose from order last week ?Per Neysa Bonito they did receive fax, but they were still alternating dose of 2 mg & 3 mg ?  ?Please advise and call Christy back [phone] with the changes & next INR check ?As well as faxing a print out of this call to [facility] @ [phone] ?

## 2022-02-09 NOTE — Telephone Encounter (Signed)
FAXED TO [facility]. LM FOR CHRISTY ?

## 2022-02-17 DIAGNOSIS — Z950 Presence of cardiac pacemaker: Secondary | ICD-10-CM | POA: Diagnosis not present

## 2022-02-17 DIAGNOSIS — F0390 Unspecified dementia without behavioral disturbance: Secondary | ICD-10-CM | POA: Diagnosis not present

## 2022-02-17 DIAGNOSIS — Z952 Presence of prosthetic heart valve: Secondary | ICD-10-CM | POA: Diagnosis not present

## 2022-02-17 DIAGNOSIS — Z7901 Long term (current) use of anticoagulants: Secondary | ICD-10-CM | POA: Diagnosis not present

## 2022-02-17 DIAGNOSIS — R2681 Unsteadiness on feet: Secondary | ICD-10-CM | POA: Diagnosis not present

## 2022-02-17 DIAGNOSIS — Z5181 Encounter for therapeutic drug level monitoring: Secondary | ICD-10-CM | POA: Diagnosis not present

## 2022-02-17 DIAGNOSIS — Z9181 History of falling: Secondary | ICD-10-CM | POA: Diagnosis not present

## 2022-02-17 DIAGNOSIS — Z8673 Personal history of transient ischemic attack (TIA), and cerebral infarction without residual deficits: Secondary | ICD-10-CM | POA: Diagnosis not present

## 2022-02-17 DIAGNOSIS — I482 Chronic atrial fibrillation, unspecified: Secondary | ICD-10-CM | POA: Diagnosis not present

## 2022-02-23 ENCOUNTER — Telehealth: Payer: Self-pay | Admitting: *Deleted

## 2022-02-23 DIAGNOSIS — Z5181 Encounter for therapeutic drug level monitoring: Secondary | ICD-10-CM | POA: Diagnosis not present

## 2022-02-23 DIAGNOSIS — I482 Chronic atrial fibrillation, unspecified: Secondary | ICD-10-CM | POA: Diagnosis not present

## 2022-02-23 DIAGNOSIS — Z7901 Long term (current) use of anticoagulants: Secondary | ICD-10-CM | POA: Diagnosis not present

## 2022-02-23 DIAGNOSIS — Z8673 Personal history of transient ischemic attack (TIA), and cerebral infarction without residual deficits: Secondary | ICD-10-CM | POA: Diagnosis not present

## 2022-02-23 DIAGNOSIS — Z950 Presence of cardiac pacemaker: Secondary | ICD-10-CM | POA: Diagnosis not present

## 2022-02-23 DIAGNOSIS — F0390 Unspecified dementia without behavioral disturbance: Secondary | ICD-10-CM | POA: Diagnosis not present

## 2022-02-23 DIAGNOSIS — R2681 Unsteadiness on feet: Secondary | ICD-10-CM | POA: Diagnosis not present

## 2022-02-23 DIAGNOSIS — Z952 Presence of prosthetic heart valve: Secondary | ICD-10-CM | POA: Diagnosis not present

## 2022-02-23 DIAGNOSIS — Z9181 History of falling: Secondary | ICD-10-CM | POA: Diagnosis not present

## 2022-02-23 NOTE — Telephone Encounter (Signed)
INR at goal, no changes to current dosing regimen.  ?

## 2022-02-23 NOTE — Telephone Encounter (Signed)
TC from [place] w/ [facility] ?INR today 3.0 ? ?  ?Please advise and call Alyse Low back [phone] ? ?And any changes need to be faxed to [facility] @ [phone] ?Print the telephone encounter and this can be faxed to [facility] ?please call [facility] [phone] to confirm fax was received. ?

## 2022-02-23 NOTE — Telephone Encounter (Signed)
Christy HH called and aware - no change  ?

## 2022-03-03 ENCOUNTER — Telehealth: Payer: Self-pay | Admitting: *Deleted

## 2022-03-03 DIAGNOSIS — Z950 Presence of cardiac pacemaker: Secondary | ICD-10-CM | POA: Diagnosis not present

## 2022-03-03 DIAGNOSIS — Z952 Presence of prosthetic heart valve: Secondary | ICD-10-CM | POA: Diagnosis not present

## 2022-03-03 DIAGNOSIS — I482 Chronic atrial fibrillation, unspecified: Secondary | ICD-10-CM | POA: Diagnosis not present

## 2022-03-03 DIAGNOSIS — R2681 Unsteadiness on feet: Secondary | ICD-10-CM | POA: Diagnosis not present

## 2022-03-03 DIAGNOSIS — Z7901 Long term (current) use of anticoagulants: Secondary | ICD-10-CM | POA: Diagnosis not present

## 2022-03-03 DIAGNOSIS — Z9181 History of falling: Secondary | ICD-10-CM | POA: Diagnosis not present

## 2022-03-03 DIAGNOSIS — Z8673 Personal history of transient ischemic attack (TIA), and cerebral infarction without residual deficits: Secondary | ICD-10-CM | POA: Diagnosis not present

## 2022-03-03 DIAGNOSIS — F0390 Unspecified dementia without behavioral disturbance: Secondary | ICD-10-CM | POA: Diagnosis not present

## 2022-03-03 DIAGNOSIS — Z5181 Encounter for therapeutic drug level monitoring: Secondary | ICD-10-CM | POA: Diagnosis not present

## 2022-03-03 NOTE — Telephone Encounter (Signed)
TC from [place] w/ [facility] ?INR today 3.3 ?  ?  ?Please advise and call Neysa Bonito back [phone] ?  ?And any changes need to be faxed to [facility] @ [phone] ?Print the telephone encounter and this can be faxed to [facility] ?please call [facility] [phone] to confirm fax was received. ?

## 2022-03-03 NOTE — Telephone Encounter (Signed)
Spoke with [place] and she is aware to recheck in 1 week.  Please call [facility] and confirm fax received  ? ?Indication: Afib ?Goal INR: 3.3 ?Current regimen: Currently taking 2mg  daily.   ? ?Recommendations:  ?Skip today, resume normal dosing 2mg  daily .  Recheck in 1 week.  Order faxed to NP at ? ? ?

## 2022-03-05 NOTE — Telephone Encounter (Signed)
Call [facility] and have them recheck his INR on Wednesday 4/26 ?

## 2022-03-06 DIAGNOSIS — R079 Chest pain, unspecified: Secondary | ICD-10-CM | POA: Diagnosis not present

## 2022-03-06 NOTE — Telephone Encounter (Signed)
[name] was made aware of instructions and re check by Dr. Nadine Counts. ?

## 2022-03-09 ENCOUNTER — Other Ambulatory Visit: Payer: Self-pay | Admitting: Nurse Practitioner

## 2022-03-09 ENCOUNTER — Telehealth: Payer: Self-pay | Admitting: *Deleted

## 2022-03-09 ENCOUNTER — Telehealth: Payer: Self-pay | Admitting: Nurse Practitioner

## 2022-03-09 DIAGNOSIS — F0390 Unspecified dementia without behavioral disturbance: Secondary | ICD-10-CM | POA: Diagnosis not present

## 2022-03-09 DIAGNOSIS — I482 Chronic atrial fibrillation, unspecified: Secondary | ICD-10-CM | POA: Diagnosis not present

## 2022-03-09 DIAGNOSIS — Z952 Presence of prosthetic heart valve: Secondary | ICD-10-CM | POA: Diagnosis not present

## 2022-03-09 DIAGNOSIS — R2681 Unsteadiness on feet: Secondary | ICD-10-CM | POA: Diagnosis not present

## 2022-03-09 DIAGNOSIS — Z9181 History of falling: Secondary | ICD-10-CM | POA: Diagnosis not present

## 2022-03-09 DIAGNOSIS — Z5181 Encounter for therapeutic drug level monitoring: Secondary | ICD-10-CM | POA: Diagnosis not present

## 2022-03-09 DIAGNOSIS — Z8673 Personal history of transient ischemic attack (TIA), and cerebral infarction without residual deficits: Secondary | ICD-10-CM | POA: Diagnosis not present

## 2022-03-09 DIAGNOSIS — Z950 Presence of cardiac pacemaker: Secondary | ICD-10-CM | POA: Diagnosis not present

## 2022-03-09 DIAGNOSIS — Z7901 Long term (current) use of anticoagulants: Secondary | ICD-10-CM | POA: Diagnosis not present

## 2022-03-09 NOTE — Telephone Encounter (Signed)
LM on Christys voicemail with instructions and faxed orders to [facility].  ?

## 2022-03-09 NOTE — Progress Notes (Signed)
Hold today then Continue current dose 3mg  daily except 2mg  on M,W and F ?

## 2022-03-09 NOTE — Telephone Encounter (Signed)
Hold today then Continue current dose 3mg daily except 2mg on M,W and F ?

## 2022-03-09 NOTE — Telephone Encounter (Signed)
INR 2.3

## 2022-03-10 ENCOUNTER — Telehealth: Payer: Self-pay | Admitting: Family Medicine

## 2022-03-10 NOTE — Telephone Encounter (Signed)
Please call nurse Ladonna Snide at [facility] regarding coumadin order for patient. ? ?[phone] ?

## 2022-03-10 NOTE — Telephone Encounter (Signed)
Tried calling number given. No answer and no vmail set up. ? ?Per Stanton Kidney Margaret's note yesterday, pt should hold coumadin for one day then resume 3mg  daily except take 2mg  on M,W,F ?

## 2022-03-13 NOTE — Telephone Encounter (Signed)
Just do the new dose regimen as ordered, starting with the hold,  today ?

## 2022-03-13 NOTE — Telephone Encounter (Signed)
[name] aware ?

## 2022-03-13 NOTE — Telephone Encounter (Signed)
Spoke with [name] at [facility].  She reports that the message arrived too late.  The patient did receive coumadin when it was supposed to be held.  Please instruct how to proceed in order to get his coumadin back on schedule ?

## 2022-03-17 ENCOUNTER — Telehealth: Payer: Self-pay | Admitting: Emergency Medicine

## 2022-03-17 DIAGNOSIS — F0390 Unspecified dementia without behavioral disturbance: Secondary | ICD-10-CM | POA: Diagnosis not present

## 2022-03-17 DIAGNOSIS — F419 Anxiety disorder, unspecified: Secondary | ICD-10-CM | POA: Diagnosis not present

## 2022-03-17 DIAGNOSIS — E079 Disorder of thyroid, unspecified: Secondary | ICD-10-CM | POA: Diagnosis not present

## 2022-03-17 DIAGNOSIS — N400 Enlarged prostate without lower urinary tract symptoms: Secondary | ICD-10-CM | POA: Diagnosis not present

## 2022-03-17 DIAGNOSIS — R2681 Unsteadiness on feet: Secondary | ICD-10-CM | POA: Diagnosis not present

## 2022-03-17 DIAGNOSIS — H409 Unspecified glaucoma: Secondary | ICD-10-CM | POA: Diagnosis not present

## 2022-03-17 DIAGNOSIS — K219 Gastro-esophageal reflux disease without esophagitis: Secondary | ICD-10-CM | POA: Diagnosis not present

## 2022-03-17 DIAGNOSIS — Z952 Presence of prosthetic heart valve: Secondary | ICD-10-CM | POA: Diagnosis not present

## 2022-03-17 DIAGNOSIS — I482 Chronic atrial fibrillation, unspecified: Secondary | ICD-10-CM | POA: Diagnosis not present

## 2022-03-17 NOTE — Telephone Encounter (Signed)
Hold coumadin through weekend.  Repeat INR Monday for further instruction. ?

## 2022-03-17 NOTE — Telephone Encounter (Signed)
INR 6.4  ? ?Patient was taking 2 mg on MWF, and 3 mg on Tuesday, Thurs, Sat, Sunday  ? ?Our order reads: One tablet by mouth every other day alternating with  1/2 tablet every other day. (Start today with 1/2 tab) ? ?Per christine was not giving correctly.  ? ? ?Fax new orders to [facility]  ? ? ?

## 2022-03-17 NOTE — Telephone Encounter (Signed)
Pt aware.

## 2022-03-17 NOTE — Telephone Encounter (Signed)
Faxed

## 2022-03-19 ENCOUNTER — Other Ambulatory Visit: Payer: Self-pay | Admitting: Family Medicine

## 2022-03-19 DIAGNOSIS — F0150 Vascular dementia without behavioral disturbance: Secondary | ICD-10-CM

## 2022-03-19 DIAGNOSIS — F02818 Dementia in other diseases classified elsewhere, unspecified severity, with other behavioral disturbance: Secondary | ICD-10-CM

## 2022-03-20 ENCOUNTER — Other Ambulatory Visit: Payer: Self-pay | Admitting: Emergency Medicine

## 2022-03-20 ENCOUNTER — Telehealth: Payer: Self-pay | Admitting: Emergency Medicine

## 2022-03-20 DIAGNOSIS — F0390 Unspecified dementia without behavioral disturbance: Secondary | ICD-10-CM | POA: Diagnosis not present

## 2022-03-20 DIAGNOSIS — I482 Chronic atrial fibrillation, unspecified: Secondary | ICD-10-CM | POA: Diagnosis not present

## 2022-03-20 DIAGNOSIS — N400 Enlarged prostate without lower urinary tract symptoms: Secondary | ICD-10-CM | POA: Diagnosis not present

## 2022-03-20 DIAGNOSIS — H409 Unspecified glaucoma: Secondary | ICD-10-CM | POA: Diagnosis not present

## 2022-03-20 DIAGNOSIS — K219 Gastro-esophageal reflux disease without esophagitis: Secondary | ICD-10-CM | POA: Diagnosis not present

## 2022-03-20 DIAGNOSIS — E079 Disorder of thyroid, unspecified: Secondary | ICD-10-CM | POA: Diagnosis not present

## 2022-03-20 DIAGNOSIS — R2681 Unsteadiness on feet: Secondary | ICD-10-CM | POA: Diagnosis not present

## 2022-03-20 DIAGNOSIS — F419 Anxiety disorder, unspecified: Secondary | ICD-10-CM | POA: Diagnosis not present

## 2022-03-20 DIAGNOSIS — Z952 Presence of prosthetic heart valve: Secondary | ICD-10-CM | POA: Diagnosis not present

## 2022-03-20 NOTE — Progress Notes (Signed)
Per Dr. Darlyn Read  ? ?Hold coumadin three days. Repeat level on 5/11 ? ? ?Fax sent to [facility] @ [phone] ?

## 2022-03-20 NOTE — Telephone Encounter (Signed)
Hold coumadin three days. Repeat level on 5/11 ?

## 2022-03-20 NOTE — Telephone Encounter (Signed)
[name] Informed. Order faxed to [facility] ?

## 2022-03-20 NOTE — Telephone Encounter (Signed)
TC from [place] w/ [facility] ?INR today 4.7 ?  ?  ?Please advise and call Alyse Low back [phone] ?  ?And any changes need to be faxed to [facility] @ [phone] ?Print the telephone encounter and this can be faxed to [facility] ?please call [facility] [phone] to confirm fax was received. ?

## 2022-03-23 ENCOUNTER — Telehealth: Payer: Self-pay | Admitting: *Deleted

## 2022-03-23 DIAGNOSIS — F0390 Unspecified dementia without behavioral disturbance: Secondary | ICD-10-CM | POA: Diagnosis not present

## 2022-03-23 DIAGNOSIS — I482 Chronic atrial fibrillation, unspecified: Secondary | ICD-10-CM | POA: Diagnosis not present

## 2022-03-23 DIAGNOSIS — K219 Gastro-esophageal reflux disease without esophagitis: Secondary | ICD-10-CM | POA: Diagnosis not present

## 2022-03-23 DIAGNOSIS — F419 Anxiety disorder, unspecified: Secondary | ICD-10-CM | POA: Diagnosis not present

## 2022-03-23 DIAGNOSIS — H409 Unspecified glaucoma: Secondary | ICD-10-CM | POA: Diagnosis not present

## 2022-03-23 DIAGNOSIS — Z952 Presence of prosthetic heart valve: Secondary | ICD-10-CM | POA: Diagnosis not present

## 2022-03-23 DIAGNOSIS — R2681 Unsteadiness on feet: Secondary | ICD-10-CM | POA: Diagnosis not present

## 2022-03-23 DIAGNOSIS — E079 Disorder of thyroid, unspecified: Secondary | ICD-10-CM | POA: Diagnosis not present

## 2022-03-23 DIAGNOSIS — N400 Enlarged prostate without lower urinary tract symptoms: Secondary | ICD-10-CM | POA: Diagnosis not present

## 2022-03-23 NOTE — Telephone Encounter (Signed)
[facility]y aware, [facility] would not answer, [facility]ed to [facility] ?

## 2022-03-23 NOTE — Telephone Encounter (Signed)
TC from [place] w/ [facility] ?INR today 1.6 ?Pt is a little more alert today, BP 80/44 HR in the 50s, normal slight irregular ?Per Neysa Bonito has had no coumadin since Friday, wonders if he should be alternating 1 mg & 2 mg instead of the 2 mg & 3 mg ?  ?  ?Please advise and call Neysa Bonito back [phone] ?  ?And any changes need to be faxed to [facility] @ [phone] ?Print the telephone encounter and this can be faxed to [facility] ?please call [facility] [phone] to confirm fax was received. ?

## 2022-03-23 NOTE — Telephone Encounter (Signed)
Lets go with 1 mg daily. Recheck INR in 1 week. ?

## 2022-03-30 ENCOUNTER — Telehealth: Payer: Self-pay | Admitting: *Deleted

## 2022-03-30 DIAGNOSIS — F0390 Unspecified dementia without behavioral disturbance: Secondary | ICD-10-CM | POA: Diagnosis not present

## 2022-03-30 DIAGNOSIS — Z952 Presence of prosthetic heart valve: Secondary | ICD-10-CM | POA: Diagnosis not present

## 2022-03-30 DIAGNOSIS — K219 Gastro-esophageal reflux disease without esophagitis: Secondary | ICD-10-CM | POA: Diagnosis not present

## 2022-03-30 DIAGNOSIS — R2681 Unsteadiness on feet: Secondary | ICD-10-CM | POA: Diagnosis not present

## 2022-03-30 DIAGNOSIS — N400 Enlarged prostate without lower urinary tract symptoms: Secondary | ICD-10-CM | POA: Diagnosis not present

## 2022-03-30 DIAGNOSIS — I482 Chronic atrial fibrillation, unspecified: Secondary | ICD-10-CM | POA: Diagnosis not present

## 2022-03-30 DIAGNOSIS — H409 Unspecified glaucoma: Secondary | ICD-10-CM | POA: Diagnosis not present

## 2022-03-30 DIAGNOSIS — F419 Anxiety disorder, unspecified: Secondary | ICD-10-CM | POA: Diagnosis not present

## 2022-03-30 DIAGNOSIS — E079 Disorder of thyroid, unspecified: Secondary | ICD-10-CM | POA: Diagnosis not present

## 2022-03-30 NOTE — Telephone Encounter (Signed)
Lm informing patient to continue coumadin dose.   Dayvon Dax. LPN

## 2022-03-30 NOTE — Telephone Encounter (Signed)
TC from [place] w/ [facility] INR today 2.0     Please advise and call Christy back [phone]   And any changes need to be faxed to [facility] @ [phone] Print the telephone encounter and this can be faxed to [facility] please call [facility] [phone] to confirm fax was received.

## 2022-03-30 NOTE — Telephone Encounter (Signed)
Continue coumadin as is °

## 2022-03-31 NOTE — Telephone Encounter (Signed)
TC to [place] w/ [facility] w/ instructions to continue coumadin as is Printed TC encounter & faxed to [facility]

## 2022-04-06 ENCOUNTER — Other Ambulatory Visit: Payer: Self-pay | Admitting: *Deleted

## 2022-04-06 DIAGNOSIS — Z7901 Long term (current) use of anticoagulants: Secondary | ICD-10-CM

## 2022-04-06 DIAGNOSIS — R2681 Unsteadiness on feet: Secondary | ICD-10-CM | POA: Diagnosis not present

## 2022-04-06 DIAGNOSIS — I482 Chronic atrial fibrillation, unspecified: Secondary | ICD-10-CM | POA: Diagnosis not present

## 2022-04-06 DIAGNOSIS — Z952 Presence of prosthetic heart valve: Secondary | ICD-10-CM | POA: Diagnosis not present

## 2022-04-06 DIAGNOSIS — K219 Gastro-esophageal reflux disease without esophagitis: Secondary | ICD-10-CM | POA: Diagnosis not present

## 2022-04-06 DIAGNOSIS — E079 Disorder of thyroid, unspecified: Secondary | ICD-10-CM | POA: Diagnosis not present

## 2022-04-06 DIAGNOSIS — F419 Anxiety disorder, unspecified: Secondary | ICD-10-CM | POA: Diagnosis not present

## 2022-04-06 DIAGNOSIS — N400 Enlarged prostate without lower urinary tract symptoms: Secondary | ICD-10-CM | POA: Diagnosis not present

## 2022-04-06 DIAGNOSIS — H409 Unspecified glaucoma: Secondary | ICD-10-CM | POA: Diagnosis not present

## 2022-04-06 DIAGNOSIS — F0390 Unspecified dementia without behavioral disturbance: Secondary | ICD-10-CM | POA: Diagnosis not present

## 2022-04-07 DIAGNOSIS — I482 Chronic atrial fibrillation, unspecified: Secondary | ICD-10-CM | POA: Diagnosis not present

## 2022-04-07 DIAGNOSIS — E079 Disorder of thyroid, unspecified: Secondary | ICD-10-CM | POA: Diagnosis not present

## 2022-04-07 DIAGNOSIS — Z952 Presence of prosthetic heart valve: Secondary | ICD-10-CM | POA: Diagnosis not present

## 2022-04-07 DIAGNOSIS — R2681 Unsteadiness on feet: Secondary | ICD-10-CM | POA: Diagnosis not present

## 2022-04-07 DIAGNOSIS — H409 Unspecified glaucoma: Secondary | ICD-10-CM | POA: Diagnosis not present

## 2022-04-07 DIAGNOSIS — F419 Anxiety disorder, unspecified: Secondary | ICD-10-CM | POA: Diagnosis not present

## 2022-04-07 DIAGNOSIS — F0390 Unspecified dementia without behavioral disturbance: Secondary | ICD-10-CM | POA: Diagnosis not present

## 2022-04-07 DIAGNOSIS — K219 Gastro-esophageal reflux disease without esophagitis: Secondary | ICD-10-CM | POA: Diagnosis not present

## 2022-04-07 DIAGNOSIS — N400 Enlarged prostate without lower urinary tract symptoms: Secondary | ICD-10-CM | POA: Diagnosis not present

## 2022-04-12 ENCOUNTER — Telehealth: Payer: Self-pay | Admitting: *Deleted

## 2022-04-12 DIAGNOSIS — Z952 Presence of prosthetic heart valve: Secondary | ICD-10-CM

## 2022-04-12 NOTE — Telephone Encounter (Signed)
TC from [place] w/ [facility] With INR results which she faxed as well, drawn on 04/07/22, results taken to [facility] & resulted to [facility] on 04/08/22 INR 1.2  PT 12.8 Patient taking 1 mg of coumadin daily  Please call Lanora Manis back with changes and when to check again.   And any changes need to be faxed to [facility] @ [phone] Print the telephone encounter and this can be faxed to [facility] please call [facility] [phone] to confirm fax was received.[facility]it [facility]

## 2022-04-12 NOTE — Telephone Encounter (Signed)
Increase coumadin to 1.5 mg daily.REcheck weekly

## 2022-04-13 ENCOUNTER — Telehealth: Payer: Self-pay | Admitting: Family Medicine

## 2022-04-13 DIAGNOSIS — K219 Gastro-esophageal reflux disease without esophagitis: Secondary | ICD-10-CM | POA: Diagnosis not present

## 2022-04-13 DIAGNOSIS — F419 Anxiety disorder, unspecified: Secondary | ICD-10-CM | POA: Diagnosis not present

## 2022-04-13 DIAGNOSIS — R2681 Unsteadiness on feet: Secondary | ICD-10-CM | POA: Diagnosis not present

## 2022-04-13 DIAGNOSIS — N400 Enlarged prostate without lower urinary tract symptoms: Secondary | ICD-10-CM | POA: Diagnosis not present

## 2022-04-13 DIAGNOSIS — F0390 Unspecified dementia without behavioral disturbance: Secondary | ICD-10-CM | POA: Diagnosis not present

## 2022-04-13 DIAGNOSIS — I482 Chronic atrial fibrillation, unspecified: Secondary | ICD-10-CM | POA: Diagnosis not present

## 2022-04-13 DIAGNOSIS — H409 Unspecified glaucoma: Secondary | ICD-10-CM | POA: Diagnosis not present

## 2022-04-13 DIAGNOSIS — E079 Disorder of thyroid, unspecified: Secondary | ICD-10-CM | POA: Diagnosis not present

## 2022-04-13 DIAGNOSIS — Z952 Presence of prosthetic heart valve: Secondary | ICD-10-CM | POA: Diagnosis not present

## 2022-04-13 NOTE — Telephone Encounter (Signed)
Faxed

## 2022-04-13 NOTE — Telephone Encounter (Signed)
Please resend INR results per [facility].

## 2022-04-13 NOTE — Telephone Encounter (Signed)
LMOVM to confirm that they received fax of print out of yesterday TC RE: INR changes for pt, to please call back if this was not received.

## 2022-04-20 ENCOUNTER — Other Ambulatory Visit: Payer: Self-pay

## 2022-04-20 ENCOUNTER — Other Ambulatory Visit: Payer: Medicare HMO

## 2022-04-20 DIAGNOSIS — F419 Anxiety disorder, unspecified: Secondary | ICD-10-CM | POA: Diagnosis not present

## 2022-04-20 DIAGNOSIS — Z7901 Long term (current) use of anticoagulants: Secondary | ICD-10-CM

## 2022-04-20 DIAGNOSIS — K219 Gastro-esophageal reflux disease without esophagitis: Secondary | ICD-10-CM | POA: Diagnosis not present

## 2022-04-20 DIAGNOSIS — N400 Enlarged prostate without lower urinary tract symptoms: Secondary | ICD-10-CM | POA: Diagnosis not present

## 2022-04-20 DIAGNOSIS — R2681 Unsteadiness on feet: Secondary | ICD-10-CM | POA: Diagnosis not present

## 2022-04-20 DIAGNOSIS — Z952 Presence of prosthetic heart valve: Secondary | ICD-10-CM | POA: Diagnosis not present

## 2022-04-20 DIAGNOSIS — F0390 Unspecified dementia without behavioral disturbance: Secondary | ICD-10-CM | POA: Diagnosis not present

## 2022-04-20 DIAGNOSIS — I482 Chronic atrial fibrillation, unspecified: Secondary | ICD-10-CM | POA: Diagnosis not present

## 2022-04-20 DIAGNOSIS — H409 Unspecified glaucoma: Secondary | ICD-10-CM | POA: Diagnosis not present

## 2022-04-20 DIAGNOSIS — E079 Disorder of thyroid, unspecified: Secondary | ICD-10-CM | POA: Diagnosis not present

## 2022-04-21 LAB — PROTIME-INR
INR: 1.7 — ABNORMAL HIGH (ref 0.9–1.2)
Prothrombin Time: 17.2 s — ABNORMAL HIGH (ref 9.1–12.0)

## 2022-04-22 ENCOUNTER — Emergency Department (HOSPITAL_COMMUNITY): Payer: Medicare HMO

## 2022-04-22 ENCOUNTER — Encounter (HOSPITAL_COMMUNITY): Payer: Self-pay

## 2022-04-22 ENCOUNTER — Emergency Department (HOSPITAL_COMMUNITY)
Admission: EM | Admit: 2022-04-22 | Discharge: 2022-04-22 | Disposition: A | Discharge status: Skilled Nursing Facility | Payer: Medicare HMO | Attending: Emergency Medicine | Admitting: Emergency Medicine

## 2022-04-22 ENCOUNTER — Other Ambulatory Visit: Payer: Self-pay

## 2022-04-22 DIAGNOSIS — Z743 Need for continuous supervision: Secondary | ICD-10-CM | POA: Diagnosis not present

## 2022-04-22 DIAGNOSIS — S199XXA Unspecified injury of neck, initial encounter: Secondary | ICD-10-CM | POA: Diagnosis not present

## 2022-04-22 DIAGNOSIS — Z043 Encounter for examination and observation following other accident: Secondary | ICD-10-CM | POA: Diagnosis not present

## 2022-04-22 DIAGNOSIS — R0902 Hypoxemia: Secondary | ICD-10-CM | POA: Diagnosis not present

## 2022-04-22 DIAGNOSIS — W06XXXA Fall from bed, initial encounter: Secondary | ICD-10-CM | POA: Diagnosis not present

## 2022-04-22 DIAGNOSIS — J90 Pleural effusion, not elsewhere classified: Secondary | ICD-10-CM | POA: Diagnosis not present

## 2022-04-22 DIAGNOSIS — S0990XA Unspecified injury of head, initial encounter: Secondary | ICD-10-CM | POA: Diagnosis not present

## 2022-04-22 DIAGNOSIS — Z7901 Long term (current) use of anticoagulants: Secondary | ICD-10-CM | POA: Insufficient documentation

## 2022-04-22 DIAGNOSIS — S0101XA Laceration without foreign body of scalp, initial encounter: Secondary | ICD-10-CM | POA: Insufficient documentation

## 2022-04-22 DIAGNOSIS — R9431 Abnormal electrocardiogram [ECG] [EKG]: Secondary | ICD-10-CM | POA: Diagnosis not present

## 2022-04-22 DIAGNOSIS — W19XXXA Unspecified fall, initial encounter: Secondary | ICD-10-CM | POA: Diagnosis not present

## 2022-04-22 DIAGNOSIS — Z23 Encounter for immunization: Secondary | ICD-10-CM | POA: Insufficient documentation

## 2022-04-22 DIAGNOSIS — R52 Pain, unspecified: Secondary | ICD-10-CM | POA: Diagnosis not present

## 2022-04-22 HISTORY — DX: Unspecified dementia, unspecified severity, without behavioral disturbance, psychotic disturbance, mood disturbance, and anxiety: F03.90

## 2022-04-22 LAB — CBC WITH DIFFERENTIAL/PLATELET
Abs Immature Granulocytes: 0.06 10*3/uL (ref 0.00–0.07)
Basophils Absolute: 0 10*3/uL (ref 0.0–0.1)
Basophils Relative: 1 %
Eosinophils Absolute: 0.1 10*3/uL (ref 0.0–0.5)
Eosinophils Relative: 1 %
HCT: 35.2 % — ABNORMAL LOW (ref 39.0–52.0)
Hemoglobin: 11.3 g/dL — ABNORMAL LOW (ref 13.0–17.0)
Immature Granulocytes: 1 %
Lymphocytes Relative: 11 %
Lymphs Abs: 0.6 10*3/uL — ABNORMAL LOW (ref 0.7–4.0)
MCH: 31.9 pg (ref 26.0–34.0)
MCHC: 32.1 g/dL (ref 30.0–36.0)
MCV: 99.4 fL (ref 80.0–100.0)
Monocytes Absolute: 0.3 10*3/uL (ref 0.1–1.0)
Monocytes Relative: 6 %
Neutro Abs: 4.5 10*3/uL (ref 1.7–7.7)
Neutrophils Relative %: 80 %
Platelets: 130 10*3/uL — ABNORMAL LOW (ref 150–400)
RBC: 3.54 MIL/uL — ABNORMAL LOW (ref 4.22–5.81)
RDW: 14.4 % (ref 11.5–15.5)
WBC: 5.6 10*3/uL (ref 4.0–10.5)
nRBC: 0 % (ref 0.0–0.2)

## 2022-04-22 LAB — BASIC METABOLIC PANEL
Anion gap: 6 (ref 5–15)
BUN: 17 mg/dL (ref 8–23)
CO2: 28 mmol/L (ref 22–32)
Calcium: 8.9 mg/dL (ref 8.9–10.3)
Chloride: 101 mmol/L (ref 98–111)
Creatinine, Ser: 1.05 mg/dL (ref 0.61–1.24)
GFR, Estimated: >60 mL/min (ref >60)
Glucose, Bld: 112 mg/dL — ABNORMAL HIGH (ref 70–99)
Potassium: 4 mmol/L (ref 3.5–5.1)
Sodium: 135 mmol/L (ref 135–145)

## 2022-04-22 LAB — PROTIME-INR
INR: 1.8 — ABNORMAL HIGH (ref 0.8–1.2)
Prothrombin Time: 20.9 seconds — ABNORMAL HIGH (ref 11.4–15.2)

## 2022-04-22 MED ORDER — LIDOCAINE-EPINEPHRINE (PF) 1 %-1:200000 IJ SOLN
10.0000 mL | Freq: Once | INTRAMUSCULAR | Status: DC
  Filled 2022-04-22: qty 30

## 2022-04-22 MED ORDER — TETANUS-DIPHTH-ACELL PERTUSSIS 5-2.5-18.5 LF-MCG/0.5 IM SUSY
0.5000 mL | PREFILLED_SYRINGE | Freq: Once | INTRAMUSCULAR | Status: AC
  Administered 2022-04-22: 0.5 mL via INTRAMUSCULAR
  Filled 2022-04-22: qty 0.5

## 2022-04-22 NOTE — ED Provider Notes (Signed)
[facility] EMERGENCY DEPARTMENT Provider Note   CSN: [id number] Arrival date & time: 04/22/22  2952     History  Chief Complaint  Patient presents with   [name]    MISHAWN [name] is a 88 y.o. male.  Patient presents to the emergency department for evaluation after a fall.  Patient had a fall at the nursing home this morning.  He apparently fell out of bed.  He is brought to the emergency department by [facility] emergency medical services.  Patient has a history of dementia, is at his baseline confusion.  Patient noted to have a laceration to the posterior left scalp.       Home Medications Prior to Admission medications   Medication Sig Start Date End Date Taking? Authorizing Provider  brimonidine-timolol (COMBIGAN) 0.2-0.5 % ophthalmic solution Place 1 drop into the right eye 2 (two) times daily.    [provider]  carbamide peroxide (DEBROX) 6.5 % OTIC solution apply 5 drops while laying on your side with ear positioned upward for 15 minutes. Repeat with other ear if needed. Use X 10 days 06/09/21   [name], MD  docusate sodium (COLACE) 100 MG capsule Take 100 mg by mouth daily.    [provider]  donepezil (ARICEPT) 10 MG tablet TAKE 1 TABLET BY MOUTH EVERY DAY AT BEDTIME 03/20/22   [name], MD  dorzolamide (TRUSOPT) 2 % ophthalmic solution Place 1 drop into the right eye 2 (two) times daily.    [provider]  DULoxetine (CYMBALTA) 20 MG capsule TAKE 1 CAPSULE EVERY DAY 01/24/22   [name], MD  latanoprost (XALATAN) 0.005 % ophthalmic solution Place 1 drop into both eyes at bedtime.    [provider]  levothyroxine (SYNTHROID) 88 MCG tablet TAKE 1 TABLET EVERY DAY 05/09/21   [name], MD  Menthol-Zinc Oxide [facility] 0.44-20.6 % OINT Apply to affected area BID 08/15/21   Bennie Pierini, FNP  Multiple Vitamin (MULTIVITAMIN) tablet Take 1 tablet by mouth daily.      [provider]  pantoprazole  (PROTONIX) 40 MG tablet TAKE 1 TABLET EVERY DAY 01/24/22   [name], MD  potassium chloride (K-DUR,KLOR-CON) 10 MEQ tablet Take 10 mEq by mouth daily as needed.  Patient not taking: Reported on 04/19/2021    [provider]  predniSONE (DELTASONE) 10 MG tablet Take 5 daily for 2 days followed by 4,3,2 and 1 for 2 days each. 04/24/21   [name], MD  tamsulosin (FLOMAX) 0.4 MG CAPS capsule TAKE 1 CAPSULE DAILY AFTER BREAKFAST 01/24/22   [name], MD  vitamin B-12 (CYANOCOBALAMIN) 1000 MCG tablet Take 1 tablet (1,000 mcg total) by mouth daily. 06/06/21   [name], MD  warfarin (COUMADIN) 2 MG tablet TAKE 1 TABLET EVERY DAY 03/20/22   [name], MD      Allergies    Ivp dye [iodinated contrast media], Nabumetone, Codeine, and Sulfonamide derivatives    Review of Systems   Review of Systems  Physical Exam Updated Vital Signs BP (!) 144/76 (BP Location: Left Arm)   Pulse 81   Temp 98.5 F (36.9 C) (Oral)   Resp (!) 23   Ht 5\' 8"  (1.727 m)   Wt 60.9 kg   SpO2 97%   BMI 20.41 kg/m  Physical Exam Vitals and nursing note reviewed.  Constitutional:      General: He is not in acute distress.    Appearance: He is well-developed.  HENT:     Head:  Normocephalic. Laceration present.      Mouth/Throat:     Mouth: Mucous membranes are moist.  Eyes:     General: Vision grossly intact. Gaze aligned appropriately.     Extraocular Movements: Extraocular movements intact.     Conjunctiva/sclera: Conjunctivae normal.     Comments: Right pupil large, irregular  Cardiovascular:     Rate and Rhythm: Normal rate and regular rhythm.     Pulses: Normal pulses.     Heart sounds: Normal heart sounds, S1 normal and S2 normal. No murmur heard.    No friction rub. No gallop.  Pulmonary:     Effort: Pulmonary effort is normal. No respiratory distress.     Breath sounds: Normal breath sounds.  Abdominal:     Palpations: Abdomen is soft.     Tenderness: There is no  abdominal tenderness. There is no guarding or rebound.     Hernia: No hernia is present.  Musculoskeletal:        General: No swelling.     Cervical back: Full passive range of motion without pain, normal range of motion and neck supple. No pain with movement, spinous process tenderness or muscular tenderness. Normal range of motion.     Right lower leg: No edema.     Left lower leg: No edema.  Skin:    General: Skin is warm and dry.     Capillary Refill: Capillary refill takes less than 2 seconds.     Findings: Laceration present. No ecchymosis, erythema, lesion or wound.  Neurological:     Mental Status: He is alert. Mental status is at baseline. He is confused.     GCS: GCS eye subscore is 4. GCS verbal subscore is 5. GCS motor subscore is 6.     Sensory: Sensation is intact.     Motor: Motor function is intact. No weakness or abnormal muscle tone.     Coordination: Coordination is intact.     ED Results / Procedures / Treatments   Labs (all labs ordered are listed, but only abnormal results are displayed) Labs Reviewed  CBC WITH DIFFERENTIAL/PLATELET - Abnormal; Notable for the following components:      Result Value   RBC 3.54 (*)    Hemoglobin 11.3 (*)    HCT 35.2 (*)    Platelets 130 (*)    Lymphs Abs 0.6 (*)    All other components within normal limits  BASIC METABOLIC PANEL  PROTIME-INR    EKG EKG Interpretation  Date/Time:  Saturday April 22 2022 06:44:08 EDT Ventricular Rate:  88 PR Interval:  187 QRS Duration: 187 QT Interval:  466 QTC Calculation: 564 R Axis:   -80 Text Interpretation: Sinus rhythm with LBBB vs atrial sensed v-pacing Confirmed by Gilda Crease [phone]) on 04/22/2022 7:09:40 AM  Radiology No results found.  Procedures Procedures    Medications Ordered in ED Medications - No data to display  ED Course/ Medical Decision Making/ A&P                           Medical Decision Making Amount and/or Complexity of Data  Reviewed Labs: ordered. Radiology: ordered.   Presents to the emergency department for evaluation after a fall.  Patient with a laceration to the left occipital parietal scalp region.  He is on blood thinners.  Will check CT head and cervical spine.  Patient does not appear to be in any distress.  He is alert and  appears to be at his normal confused baseline.  He does have a history of dementia.  Check basic labs, chest and pelvis.  Reevaluate after imaging.  Patient did have unequal pupils but he does have documented history of bilateral cataract surgery and this is likely chronic.  Will sign to oncoming ER physician to follow-up on imaging.        Final Clinical Impression(s) / ED Diagnoses Final diagnoses:  None    Rx / DC Orders ED Discharge Orders     None         Jael Kostick, Canary Brim, MD 04/22/22 [phone]

## 2022-04-22 NOTE — ED Notes (Signed)
X2 staples placed by Dr Coralee Pesa. Patient tolerated well.

## 2022-04-22 NOTE — ED Notes (Signed)
Son transporting pt back to [facility].  Pt assisted to [facility] and into son's vehicle

## 2022-04-22 NOTE — ED Provider Notes (Signed)
Patient signed out to me by previous provider. Please refer to their note for full HPI.  Briefly this is a 88 year old male with fall, head injury on anticoagulation.  Pending CT/x-ray imaging.  Reported to be baseline per the son at bedside. Physical Exam  BP 112/73   Pulse 88   Temp 98.5 F (36.9 C) (Oral)   Resp 20   Ht 5\' 8"  (1.727 m)   Wt 60.9 kg   SpO2 92%   BMI 20.41 kg/m   Physical Exam Vitals and nursing note reviewed.  Constitutional:      Appearance: Normal appearance.  HENT:     Head: Normocephalic.     Comments: 2 small lacerations to the left parietal/left occipital, bleeding controlled, no pulsatile bleeding    Mouth/Throat:     Mouth: Mucous membranes are moist.  Eyes:     Comments: Unequal pupils which is reported to be baseline, status post cataract surgery  Cardiovascular:     Rate and Rhythm: Normal rate.  Pulmonary:     Effort: Pulmonary effort is normal. No respiratory distress.  Abdominal:     Palpations: Abdomen is soft.     Tenderness: There is no abdominal tenderness.  Skin:    General: Skin is warm.  Neurological:     Mental Status: He is alert and oriented to person, place, and time. Mental status is at baseline.  Psychiatric:        Mood and Affect: Mood normal.     Procedures  . Laceration Repair  Date/Time: 04/22/2022 10:45 AM  Performed by: 06/22/2022, DO Authorized by: Rozelle Logan, DO   Consent:    Consent obtained:  Verbal   Consent given by:  Patient and healthcare agent   Risks, benefits, and alternatives were discussed: yes     Risks discussed:  Infection, poor cosmetic result, poor wound healing and need for additional repair   Alternatives discussed:  No treatment Universal protocol:    Patient identity confirmed:  Arm band Anesthesia:    Anesthesia method:  None Laceration details:    Location:  Scalp   Scalp location:  L parietal   Length (cm):  1 Exploration:    Hemostasis achieved with:  Direct  pressure Treatment:    Amount of cleaning:  Standard   Irrigation solution:  Sterile saline   Irrigation method:  Syringe Skin repair:    Repair method:  Staples   Number of staples:  1 Approximation:    Approximation:  Close Repair type:    Repair type:  Simple Post-procedure details:    Dressing:  Non-adherent dressing   Procedure completion:  Tolerated .Rozelle LoganLaceration Repair  Date/Time: 04/22/2022 10:46 AM  Performed by: 06/22/2022, DO Authorized by: Rozelle Logan, DO   Consent:    Consent obtained:  Verbal   Consent given by:  Patient and healthcare agent   Risks discussed:  Infection, need for additional repair, poor cosmetic result, pain and poor wound healing   Alternatives discussed:  No treatment Universal protocol:    Patient identity confirmed:  Verbally with patient and arm band Anesthesia:    Anesthesia method:  None Laceration details:    Location:  Scalp   Scalp location:  Occipital   Length (cm):  2 Exploration:    Hemostasis achieved with:  Direct pressure Treatment:    Amount of cleaning:  Standard   Irrigation solution:  Sterile saline   Irrigation method:  Syringe Skin repair:    Repair  method:  Staples   Number of staples:  1 Approximation:    Approximation:  Close Repair type:    Repair type:  Simple Post-procedure details:    Dressing:  Non-adherent dressing   Procedure completion:  Tolerated   ED Course / MDM    Medical Decision Making Amount and/or Complexity of Data Reviewed Labs: ordered. Radiology: ordered.  Risk Prescription drug management.   CT of the head and cervical spine is unremarkable.  Chest x-ray identifies 2 left rib fractures with associated atelectasis/contusion.  The son reveals that the patient fell about 5 weeks ago and since then has been complaining of left-sided rib pain.  There is no current tachypnea or hypoxia.  Currently the patient is not complaining of any pain.  We will plan to treat  conservatively.  2 small lacerations to the side of the head were repaired with staples, tetanus updated.  Pelvis x-ray is stable.  No acute changes per patient/son at bedside.  Plan for outpatient follow-up. Blood work stable.  Patient at this time appears safe and stable for discharge and close outpatient follow up. Discharge plan and strict return to ED precautions discussed, patient verbalizes understanding and agreement.      Rozelle Logan, DO 04/22/22 1058

## 2022-04-22 NOTE — ED Triage Notes (Signed)
Pt arrived from [facility] in [place] via [facility] following a fall out of bed. Pt presents baseline confused with Hx of Dementia, on blood thinners with laceration to left scalp. Bleeding not controled att this time. Pt poor historian. Pupils unequal upon presentation, but facility reports this is baseline for Pt, and report Pt receives multiple eye drops for dilated right pupil.

## 2022-04-22 NOTE — Discharge Instructions (Addendum)
You have been seen and discharged from the emergency department. Your head CT and neck CT was normal. Chest XR shows two left rib fractures, seem to be old from your fall a few weeks ago. Follow-up with your primary provider for further evaluation and further care. Two staples should be removed in 7-10 days. Take home medications as prescribed.

## 2022-04-24 ENCOUNTER — Telehealth: Payer: Self-pay | Admitting: *Deleted

## 2022-04-24 NOTE — Telephone Encounter (Signed)
LMOVM to Neysa Bonito w/ [facility] INR drawn on 04/20/22, results in chart were 1.7 Dr. Darlyn Read sent message on results to continue coumadin as is    Printed telephone encounter and faxed to [facility]

## 2022-04-26 ENCOUNTER — Telehealth: Payer: Self-pay | Admitting: *Deleted

## 2022-04-26 DIAGNOSIS — F0390 Unspecified dementia without behavioral disturbance: Secondary | ICD-10-CM | POA: Diagnosis not present

## 2022-04-26 DIAGNOSIS — I482 Chronic atrial fibrillation, unspecified: Secondary | ICD-10-CM | POA: Diagnosis not present

## 2022-04-26 DIAGNOSIS — N400 Enlarged prostate without lower urinary tract symptoms: Secondary | ICD-10-CM | POA: Diagnosis not present

## 2022-04-26 DIAGNOSIS — F419 Anxiety disorder, unspecified: Secondary | ICD-10-CM | POA: Diagnosis not present

## 2022-04-26 DIAGNOSIS — Z952 Presence of prosthetic heart valve: Secondary | ICD-10-CM | POA: Diagnosis not present

## 2022-04-26 DIAGNOSIS — E079 Disorder of thyroid, unspecified: Secondary | ICD-10-CM | POA: Diagnosis not present

## 2022-04-26 DIAGNOSIS — K219 Gastro-esophageal reflux disease without esophagitis: Secondary | ICD-10-CM | POA: Diagnosis not present

## 2022-04-26 DIAGNOSIS — R2681 Unsteadiness on feet: Secondary | ICD-10-CM | POA: Diagnosis not present

## 2022-04-26 DIAGNOSIS — H409 Unspecified glaucoma: Secondary | ICD-10-CM | POA: Diagnosis not present

## 2022-04-26 NOTE — Telephone Encounter (Signed)
TC to [facility] w/ [facility],  Hold today's dose then 1 mg daily, recheck in 2 wks Encounter printed & faxed to [facility]

## 2022-04-26 NOTE — Telephone Encounter (Signed)
TC from [place] w/ [facility] INR today 3.4 BP was low, HR 45, pt had a fall on 04/22/22, is to come in on 04/28/22 to have staples removed     Please advise and call Christy back [phone]   And any changes need to be faxed to [facility] @ [phone] Print the telephone encounter and this can be faxed to [facility] please call [facility] [phone] to confirm fax was received.

## 2022-04-28 ENCOUNTER — Encounter: Payer: Self-pay | Admitting: Family Medicine

## 2022-04-28 ENCOUNTER — Ambulatory Visit (INDEPENDENT_AMBULATORY_CARE_PROVIDER_SITE_OTHER): Payer: Medicare HMO | Admitting: Family Medicine

## 2022-04-28 VITALS — BP 92/51 | HR 82 | Temp 97.3°F

## 2022-04-28 DIAGNOSIS — Z4802 Encounter for removal of sutures: Secondary | ICD-10-CM

## 2022-04-28 NOTE — Progress Notes (Unsigned)
Assessment & Plan:  1. Encounter for staple removal Stapes removed without difficulty.    Stanley Boston, MSN, APRN, FNP-C [facility]  Subjective:   Patient ID: [name], male    DOB: 1928/01/22, 88 y.o.   MRN: [id number]  HPI: [name] is a 88 y.o. male presenting on 04/28/2022 for Suture / Staple Removal  Patient is accompanied by his son and caregiver.   Patient was seen in the ER on 04/22/2022 after a fall for a laceration repair to his scalp. He had two staples placed - one left parietal, one left occipital. He is here for staple removal.    ROS: Negative unless specifically indicated above in HPI.   Relevant past medical history reviewed and updated as indicated.   Allergies and medications reviewed and updated.   Current Outpatient Medications:    brimonidine-timolol (COMBIGAN) 0.2-0.5 % ophthalmic solution, Place 1 drop into the right eye 2 (two) times daily., Disp: , Rfl:    carbamide peroxide (DEBROX) 6.5 % OTIC solution, apply 5 drops while laying on your side with ear positioned upward for 15 minutes. Repeat with other ear if needed. Use X 10 days, Disp: 15 mL, Rfl: 0   docusate sodium (COLACE) 100 MG capsule, Take 100 mg by mouth daily., Disp: , Rfl:    donepezil (ARICEPT) 10 MG tablet, TAKE 1 TABLET BY MOUTH EVERY DAY AT BEDTIME, Disp: 90 tablet, Rfl: 0   dorzolamide (TRUSOPT) 2 % ophthalmic solution, Place 1 drop into the right eye 2 (two) times daily., Disp: , Rfl:    DULoxetine (CYMBALTA) 20 MG capsule, TAKE 1 CAPSULE EVERY DAY, Disp: 90 capsule, Rfl: 0   latanoprost (XALATAN) 0.005 % ophthalmic solution, Place 1 drop into both eyes at bedtime., Disp: , Rfl:    levothyroxine (SYNTHROID) 88 MCG tablet, TAKE 1 TABLET EVERY DAY, Disp: 90 tablet, Rfl: 2   Menthol-Zinc Oxide (CALMOSEPTINE) 0.44-20.6 % OINT, Apply to affected area BID, Disp: 113 g, Rfl: 2   Multiple Vitamin (MULTIVITAMIN) tablet, Take 1 tablet by mouth daily.   , Disp: , Rfl:    pantoprazole (PROTONIX) 40 MG tablet, TAKE 1 TABLET EVERY DAY, Disp: 90 tablet, Rfl: 1   tamsulosin (FLOMAX) 0.4 MG CAPS capsule, TAKE 1 CAPSULE DAILY AFTER BREAKFAST, Disp: 90 capsule, Rfl: 1   vitamin B-12 (CYANOCOBALAMIN) 1000 MCG tablet, Take 1 tablet (1,000 mcg total) by mouth daily., Disp: , Rfl:    warfarin (COUMADIN) 2 MG tablet, TAKE 1 TABLET EVERY DAY, Disp: 90 tablet, Rfl: 3   potassium chloride (K-DUR,KLOR-CON) 10 MEQ tablet, Take 10 mEq by mouth daily as needed.  (Patient not taking: Reported on 04/19/2021), Disp: , Rfl:   Allergies  Allergen Reactions   Ivp Dye [Iodinated Contrast Media] Swelling    Swelling    Nabumetone Hives   Codeine Nausea Only    Nausea    Sulfonamide Derivatives Hives and Itching    Hives itching     Objective:   BP (!) 92/51   Pulse 82   Temp (!) 97.3 F (36.3 C) (Temporal)    Physical Exam Vitals reviewed.  Constitutional:      General: He is not in acute distress.    Appearance: Normal appearance. He is not ill-appearing, toxic-appearing or diaphoretic.  HENT:     Head: Normocephalic and atraumatic.  Eyes:     General: No scleral icterus.       Right eye: No discharge.  Left eye: No discharge.     Conjunctiva/sclera: Conjunctivae normal.  Cardiovascular:     Rate and Rhythm: Normal rate.  Pulmonary:     Effort: Pulmonary effort is normal. No respiratory distress.  Musculoskeletal:        General: Normal range of motion.     Cervical back: Normal range of motion.  Skin:    General: Skin is warm and dry.     Findings: Laceration (left parietal and left occipital scalp, each with one staple. Well approximated. No swelling, erythema, warmth, or drainage.) present.  Neurological:     Mental Status: He is alert and oriented to person, place, and time. Mental status is at baseline.     Gait: Gait abnormal (riding in [facility]).  Psychiatric:        Mood and Affect: Mood normal.        Behavior: Behavior normal.         Thought Content: Thought content normal.        Judgment: Judgment normal.    Suture Removal  Date/Time: 04/28/2022 10:00 AM  Performed by: Gwenlyn Fudge, FNP Authorized by: Gwenlyn Fudge, FNP  Body area: head/neck Location details: scalp Wound Appearance: clean Staples Removed: 2 Patient tolerance: patient tolerated the procedure well with no immediate complications

## 2022-05-01 ENCOUNTER — Encounter: Payer: Self-pay | Admitting: Family Medicine

## 2022-05-02 ENCOUNTER — Telehealth: Payer: Self-pay | Admitting: Family Medicine

## 2022-05-08 ENCOUNTER — Other Ambulatory Visit: Payer: Self-pay | Admitting: Family Medicine

## 2022-05-08 DIAGNOSIS — M159 Polyosteoarthritis, unspecified: Secondary | ICD-10-CM

## 2022-05-08 DIAGNOSIS — F0150 Vascular dementia without behavioral disturbance: Secondary | ICD-10-CM

## 2022-05-08 DIAGNOSIS — R296 Repeated falls: Secondary | ICD-10-CM

## 2022-05-09 ENCOUNTER — Other Ambulatory Visit: Payer: Self-pay | Admitting: Family Medicine

## 2022-05-09 ENCOUNTER — Telehealth: Payer: Self-pay | Admitting: Family Medicine

## 2022-05-09 DIAGNOSIS — F0150 Vascular dementia without behavioral disturbance: Secondary | ICD-10-CM

## 2022-05-09 DIAGNOSIS — I482 Chronic atrial fibrillation, unspecified: Secondary | ICD-10-CM

## 2022-05-09 DIAGNOSIS — Z7901 Long term (current) use of anticoagulants: Secondary | ICD-10-CM

## 2022-05-09 DIAGNOSIS — Z952 Presence of prosthetic heart valve: Secondary | ICD-10-CM

## 2022-05-09 DIAGNOSIS — R296 Repeated falls: Secondary | ICD-10-CM

## 2022-05-09 DIAGNOSIS — I25118 Atherosclerotic heart disease of native coronary artery with other forms of angina pectoris: Secondary | ICD-10-CM

## 2022-05-09 DIAGNOSIS — I4729 Other ventricular tachycardia: Secondary | ICD-10-CM

## 2022-05-09 DIAGNOSIS — N1832 Chronic kidney disease, stage 3b: Secondary | ICD-10-CM

## 2022-05-09 DIAGNOSIS — E039 Hypothyroidism, unspecified: Secondary | ICD-10-CM

## 2022-05-09 NOTE — Telephone Encounter (Signed)
Referral placed, as requested [facility] 

## 2022-05-10 DIAGNOSIS — K219 Gastro-esophageal reflux disease without esophagitis: Secondary | ICD-10-CM | POA: Diagnosis not present

## 2022-05-10 DIAGNOSIS — E079 Disorder of thyroid, unspecified: Secondary | ICD-10-CM | POA: Diagnosis not present

## 2022-05-10 DIAGNOSIS — I482 Chronic atrial fibrillation, unspecified: Secondary | ICD-10-CM | POA: Diagnosis not present

## 2022-05-10 DIAGNOSIS — F419 Anxiety disorder, unspecified: Secondary | ICD-10-CM | POA: Diagnosis not present

## 2022-05-10 DIAGNOSIS — Z952 Presence of prosthetic heart valve: Secondary | ICD-10-CM | POA: Diagnosis not present

## 2022-05-10 DIAGNOSIS — N400 Enlarged prostate without lower urinary tract symptoms: Secondary | ICD-10-CM | POA: Diagnosis not present

## 2022-05-10 DIAGNOSIS — H409 Unspecified glaucoma: Secondary | ICD-10-CM | POA: Diagnosis not present

## 2022-05-10 DIAGNOSIS — R2681 Unsteadiness on feet: Secondary | ICD-10-CM | POA: Diagnosis not present

## 2022-05-10 DIAGNOSIS — F0390 Unspecified dementia without behavioral disturbance: Secondary | ICD-10-CM | POA: Diagnosis not present

## 2022-05-10 NOTE — Telephone Encounter (Signed)
Patient's son [name] Aware.

## 2022-05-12 ENCOUNTER — Telehealth: Payer: Self-pay | Admitting: *Deleted

## 2022-05-12 DIAGNOSIS — Z952 Presence of prosthetic heart valve: Secondary | ICD-10-CM

## 2022-05-12 NOTE — Telephone Encounter (Signed)
Description   INR 1.8 today-goal 1.8-2.2 Diagnosis valvular A-fib Continue current dose of 1 mg every day, looks like he has a lower goal, likely because of high risk Recheck in 3 to 4 weeks  Recheck in 3-4 days.     Arville Care, MD [facility] 05/12/2022, 3:27 PM

## 2022-05-12 NOTE — Telephone Encounter (Signed)
Left voicemail with Neysa Bonito with dosing instruction and return date.

## 2022-05-12 NOTE — Telephone Encounter (Signed)
TC from [place] w/ [facility] INR Wed afternoon 1.8    Please advise and call Christy back [phone]   And any changes need to be faxed to [facility] @ [phone] Print the telephone encounter and this can be faxed to [facility] please call [facility] [phone] to confirm fax was received.

## 2022-05-12 NOTE — Telephone Encounter (Signed)
I need to know what he is on currently?  According to pathway.anticoagdose Milligram and 3 mg tablets but then the last telephone call it looks like he might be taking 1 mg?  Or maybe 1 tablet daily?  Please call and ask what he is on currently so that we can make predictions.  Also look like in a recent note Dr. Darlyn Read was talking about discontinuing his Coumadin because of falls and risks? Arville Care, MD [facility] 05/12/2022, 12:57 PM

## 2022-05-12 NOTE — Telephone Encounter (Signed)
Per [facility], patient is taking 1 mg daily.

## 2022-05-15 ENCOUNTER — Telehealth: Payer: Self-pay

## 2022-05-15 DIAGNOSIS — F419 Anxiety disorder, unspecified: Secondary | ICD-10-CM | POA: Diagnosis not present

## 2022-05-15 DIAGNOSIS — K219 Gastro-esophageal reflux disease without esophagitis: Secondary | ICD-10-CM | POA: Diagnosis not present

## 2022-05-15 DIAGNOSIS — H409 Unspecified glaucoma: Secondary | ICD-10-CM | POA: Diagnosis not present

## 2022-05-15 DIAGNOSIS — Z952 Presence of prosthetic heart valve: Secondary | ICD-10-CM | POA: Diagnosis not present

## 2022-05-15 DIAGNOSIS — E079 Disorder of thyroid, unspecified: Secondary | ICD-10-CM | POA: Diagnosis not present

## 2022-05-15 DIAGNOSIS — N401 Enlarged prostate with lower urinary tract symptoms: Secondary | ICD-10-CM | POA: Diagnosis not present

## 2022-05-15 DIAGNOSIS — R2681 Unsteadiness on feet: Secondary | ICD-10-CM | POA: Diagnosis not present

## 2022-05-15 DIAGNOSIS — F0390 Unspecified dementia without behavioral disturbance: Secondary | ICD-10-CM | POA: Diagnosis not present

## 2022-05-15 DIAGNOSIS — I482 Chronic atrial fibrillation, unspecified: Secondary | ICD-10-CM | POA: Diagnosis not present

## 2022-05-15 NOTE — Telephone Encounter (Signed)
Christy from [facility] called to let us know patient's INR is 1.1 today and his blood pressure is running in the 80's/40's.  She said he is stable, oxygen and pulse are normal.If you would like her to do anything different, please let her know.

## 2022-05-15 NOTE — Telephone Encounter (Signed)
Description   INR 1.1 today-goal 1.8-2.2 Diagnosis valvular A-fib Take 2 whole 1 mg tablets today and only and then increase to take 1-1/2 tablets every Friday or 1.5 mg and take 1 mg every other day, looks like he has a lower goal, likely because of high risk Recheck in 3 to 4 weeks  Recheck in 3-4 days.     It looks like Dr. Darlyn Read was discussing possibly taking him off of the Coumadin entirely because of high risk, I do not know if that has been propagated to the family and what is their decision on that but that is something that should be discussed more between them and Dr. Darlyn Read.  With the blood pressure being low I would make sure he hydrates really well with fluids.  It looks like from what I can tell his blood pressure normally runs low but keep an eye on it and keep checking it however often they check it currently and let us know if it is running any higher or lower.  Call in some blood pressures next week for Dr. Darlyn Read when he returns.  Arville Care, MD [facility] 05/15/2022, 10:58 AM

## 2022-05-17 NOTE — Telephone Encounter (Signed)
TC to [name], talked to pt's son regarding referral to Hospice vs Palliative care and continuing or DC'ing pt's Coumadin and INR checks. [name] was not quite comfortable with making this decision on his own.  TC to Caryn Bee w/ [facility] to find out if pt was placed w/ Palliative care or Hospice. With Palliative care, Home Health can continue w/ services and check pt's INR levels, on Hospice, HH can not have services in conjunction w/ Hospice.  After pt was seen this past weekend, they did deem him appropriate and admitted him to Hospice.  With Hospice in place and if Coumadin will be DCd, will need to write order to DC Coumadin to have this faxed to [facility] as well as a DC order for [facility] to be faxed to [facility] office. I will call and inform Neysa Bonito, nurse w/ [phone] as well.

## 2022-05-18 ENCOUNTER — Ambulatory Visit (INDEPENDENT_AMBULATORY_CARE_PROVIDER_SITE_OTHER): Payer: Medicare HMO

## 2022-05-18 DIAGNOSIS — I482 Chronic atrial fibrillation, unspecified: Secondary | ICD-10-CM

## 2022-05-18 DIAGNOSIS — H409 Unspecified glaucoma: Secondary | ICD-10-CM

## 2022-05-18 DIAGNOSIS — F0390 Unspecified dementia without behavioral disturbance: Secondary | ICD-10-CM

## 2022-05-18 DIAGNOSIS — K219 Gastro-esophageal reflux disease without esophagitis: Secondary | ICD-10-CM

## 2022-05-18 DIAGNOSIS — N401 Enlarged prostate with lower urinary tract symptoms: Secondary | ICD-10-CM

## 2022-05-18 DIAGNOSIS — F419 Anxiety disorder, unspecified: Secondary | ICD-10-CM

## 2022-05-18 DIAGNOSIS — R2681 Unsteadiness on feet: Secondary | ICD-10-CM

## 2022-05-18 DIAGNOSIS — E079 Disorder of thyroid, unspecified: Secondary | ICD-10-CM | POA: Diagnosis not present

## 2022-05-23 ENCOUNTER — Other Ambulatory Visit: Payer: Self-pay | Admitting: Family Medicine

## 2022-06-15 ENCOUNTER — Telehealth: Payer: Self-pay | Admitting: *Deleted

## 2022-06-15 NOTE — Telephone Encounter (Signed)
TC to [name], if pt has to change facility's to a SNF d/t resent falls, he will NTBS in person for a new FL2. This came up after I talked to the director at [facility] about a call report on a fall on another pt yesterday.

## 2022-06-21 ENCOUNTER — Ambulatory Visit (INDEPENDENT_AMBULATORY_CARE_PROVIDER_SITE_OTHER): Admitting: Family Medicine

## 2022-06-21 ENCOUNTER — Encounter: Payer: Self-pay | Admitting: Family Medicine

## 2022-06-21 VITALS — BP 114/65 | HR 62 | Temp 97.2°F

## 2022-06-21 DIAGNOSIS — Z950 Presence of cardiac pacemaker: Secondary | ICD-10-CM | POA: Diagnosis not present

## 2022-06-21 DIAGNOSIS — F0150 Vascular dementia without behavioral disturbance: Secondary | ICD-10-CM

## 2022-06-21 DIAGNOSIS — E032 Hypothyroidism due to medicaments and other exogenous substances: Secondary | ICD-10-CM

## 2022-06-21 DIAGNOSIS — Z952 Presence of prosthetic heart valve: Secondary | ICD-10-CM | POA: Diagnosis not present

## 2022-06-21 DIAGNOSIS — I482 Chronic atrial fibrillation, unspecified: Secondary | ICD-10-CM

## 2022-06-21 DIAGNOSIS — M159 Polyosteoarthritis, unspecified: Secondary | ICD-10-CM | POA: Diagnosis not present

## 2022-06-21 DIAGNOSIS — R296 Repeated falls: Secondary | ICD-10-CM | POA: Diagnosis not present

## 2022-06-21 DIAGNOSIS — I25118 Atherosclerotic heart disease of native coronary artery with other forms of angina pectoris: Secondary | ICD-10-CM | POA: Diagnosis not present

## 2022-06-21 NOTE — Progress Notes (Signed)
+  Subjective:  Patient ID: [name], male    DOB: 02/07/1928  Age: 88 y.o. MRN: [id number]  CC: No chief complaint on file.   HPI [name] presents for frequent falling. [name] Circle out of bed and had staples to scalp 3 weeks ago. [name] Circle out of wheelchair last week. He is unable to do transfers. Totally incontinent of bowel and bladder. Has to be fed or assisted with eating. Has tremor at times that prevents this. Usuaally he just sits up and his head falls over on his chest. Minimally communicative due to  dementia.      06/21/2022    1:11 PM 04/28/2022   10:14 AM 04/19/2021   10:03 AM  Depression screen PHQ 2/9  Decreased Interest 0 0 0  Down, Depressed, Hopeless 0 0 2  PHQ - 2 Score 0 0 2  Altered sleeping  0 2  Tired, decreased energy  0 2  Change in appetite  0 1  Feeling bad or failure about yourself   0 0  Trouble concentrating  0 1  Moving slowly or fidgety/restless  0 3  Suicidal thoughts  0 1  PHQ-9 Score  0 12  Difficult doing work/chores  Not difficult at all Not difficult at all    History [name] has a past medical history of Aortic stenosis, CAD (coronary artery disease), Carotid stenosis, Cataract, CVA (cerebral infarction) (12/14/2010), Dementia ([place]), DVT (deep venous thrombosis) ([place]), History of pulmonary embolism, History of rib fracture, History of shingles, Hyperlipidemia, Hypothyroidism, Pacemaker  Biotronik, Personal history of subdural hematoma, S/P cholecystectomy (11/14/2003), S/P shoulder surgery, Stroke ([place]) (11/13/2010), and Syncope.   He has a past surgical history that includes Aortic valve replacement (1985); Cardiac catheterization; Vascular surgery; Brain surgery; permanent pacemaker insertion (N/A, 08/01/2012); and Eye surgery.   His family history includes Arthritis in his sister; Cancer in his father; Coronary artery disease in an other family member; Heart attack in his brother, brother, and father; Heart disease in his brother and  father; Stroke in his mother.He reports that he has never smoked. He has never used smokeless tobacco. He reports that he does not drink alcohol and does not use drugs.    ROS Review of Systems  Unable to perform ROS: Dementia    Objective:  BP 114/65   Pulse 62   Temp (!) 97.2 F (36.2 C)   SpO2 97%   BP Readings from Last 3 Encounters:  06/21/22 114/65  04/28/22 (!) 92/51  04/22/22 136/73    Wt Readings from Last 3 Encounters:  04/22/22 134 lb 3.2 oz (60.9 kg)  04/19/21 125 lb 9.6 oz (57 kg)  09/13/20 125 lb (56.7 kg)     Physical Exam Vitals reviewed.  Constitutional:      General: He is not in acute distress.    Appearance: He is well-developed. He is ill-appearing.  HENT:     Head: Normocephalic and atraumatic.     Right Ear: External ear normal.     Left Ear: External ear normal.     Mouth/Throat:     Pharynx: No oropharyngeal exudate or posterior oropharyngeal erythema.  Eyes:     Pupils: Pupils are equal, round, and reactive to light.  Cardiovascular:     Rate and Rhythm: Normal rate and regular rhythm.     Heart sounds: No murmur heard. Pulmonary:     Effort: No respiratory distress.     Breath sounds: Wheezing present.  Abdominal:  General: Abdomen is flat. There is no distension.     Tenderness: There is no abdominal tenderness.  Musculoskeletal:     Cervical back: Normal range of motion and neck supple.  Neurological:     Mental Status: He is alert. He is disoriented.     Cranial Nerves: No cranial nerve deficit.     Motor: Weakness present.     Gait: Gait abnormal (unable).  Psychiatric:        Attention and Perception: He is inattentive.        Speech: Speech is slurred.        Behavior: Behavior is withdrawn.        Cognition and Memory: Cognition is impaired.       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 I am also having him maintain his multivitamin, docusate sodium, latanoprost, dorzolamide, brimonidine-timolol, potassium chloride, cyanocobalamin, Debrox, Calmoseptine, tamsulosin, pantoprazole, DULoxetine, donepezil, and levothyroxine.  Allergies as of 06/21/2022       Reactions   Ivp Dye [iodinated Contrast Media] Swelling   Swelling   Nabumetone Hives   Codeine Nausea Only   Nausea   Sulfonamide Derivatives Hives, Itching   Hives itching        Medication List        Accurate as of June 21, 2022  2:06 PM. If you have any questions, ask your nurse or doctor.          STOP taking these medications    warfarin 2 MG tablet Commonly known as: COUMADIN Stopped by: Claretta Fraise, MD       TAKE these medications    brimonidine-timolol 0.2-0.5 % ophthalmic solution Commonly known as: COMBIGAN Place 1 drop into the right eye 2 (two) times daily.   Calmoseptine 0.44-20.6 % Oint Generic drug: Menthol-Zinc Oxide Apply to affected area BID   cyanocobalamin 1000 MCG tablet Commonly known as: VITAMIN B12 Take 1 tablet (1,000 mcg total) by mouth daily.   Debrox 6.5 % OTIC solution Generic drug: carbamide peroxide apply 5 drops while laying on your side with ear positioned upward for 15 minutes. Repeat with other ear if needed. Use X 10 days   docusate sodium 100 MG capsule Commonly known as: COLACE Take 100 mg by mouth daily.   donepezil 10 MG tablet Commonly known as: ARICEPT TAKE 1 TABLET BY MOUTH EVERY DAY AT BEDTIME   dorzolamide 2 % ophthalmic  solution Commonly known as: TRUSOPT Place 1 drop into the right eye 2 (two) times daily.   DULoxetine 20 MG capsule Commonly known as: CYMBALTA TAKE 1 CAPSULE EVERY DAY   latanoprost 0.005 % ophthalmic solution Commonly known as: XALATAN Place 1 drop into both eyes at bedtime.   levothyroxine 88 MCG tablet Commonly known as: SYNTHROID Take 1 tablet (88 mcg total) by mouth daily. (NEEDS TO BE SEEN BEFORE NEXT REFILL)   multivitamin tablet Take 1 tablet by mouth daily.   pantoprazole 40 MG tablet Commonly known as: PROTONIX  TAKE 1 TABLET EVERY DAY   potassium chloride 10 MEQ tablet Commonly known as: KLOR-CON M Take 10 mEq by mouth daily as needed.   tamsulosin 0.4 MG Caps capsule Commonly known as: FLOMAX TAKE 1 CAPSULE DAILY AFTER BREAKFAST         Follow-up: Return in about 3 months (around 09/21/2022).  Claretta Fraise, M.D.

## 2022-06-22 ENCOUNTER — Other Ambulatory Visit: Payer: Self-pay | Admitting: Family Medicine

## 2022-06-22 LAB — CBC WITH DIFFERENTIAL/PLATELET
Basophils Absolute: 0 10*3/uL (ref 0.0–0.2)
Basos: 1 %
EOS (ABSOLUTE): 0.2 10*3/uL (ref 0.0–0.4)
Eos: 4 %
Hematocrit: 33.8 % — ABNORMAL LOW (ref 37.5–51.0)
Hemoglobin: 10.9 g/dL — ABNORMAL LOW (ref 13.0–17.7)
Immature Grans (Abs): 0 10*3/uL (ref 0.0–0.1)
Immature Granulocytes: 0 %
Lymphocytes Absolute: 0.6 10*3/uL — ABNORMAL LOW (ref 0.7–3.1)
Lymphs: 15 %
MCH: 31.7 pg (ref 26.6–33.0)
MCHC: 32.2 g/dL (ref 31.5–35.7)
MCV: 98 fL — ABNORMAL HIGH (ref 79–97)
Monocytes Absolute: 0.3 10*3/uL (ref 0.1–0.9)
Monocytes: 8 %
Neutrophils Absolute: 3 10*3/uL (ref 1.4–7.0)
Neutrophils: 72 %
Platelets: 159 10*3/uL (ref 150–450)
RBC: 3.44 x10E6/uL — ABNORMAL LOW (ref 4.14–5.80)
RDW: 14 % (ref 11.6–15.4)
WBC: 4.2 10*3/uL (ref 3.4–10.8)

## 2022-06-22 LAB — CMP14+EGFR
ALT: 8 IU/L (ref 0–44)
AST: 20 IU/L (ref 0–40)
Albumin/Globulin Ratio: 1 — ABNORMAL LOW (ref 1.2–2.2)
Albumin: 3.5 g/dL — ABNORMAL LOW (ref 3.6–4.6)
Alkaline Phosphatase: 119 IU/L (ref 44–121)
BUN/Creatinine Ratio: 22 (ref 10–24)
BUN: 23 mg/dL (ref 10–36)
Bilirubin Total: 0.3 mg/dL (ref 0.0–1.2)
CO2: 24 mmol/L (ref 20–29)
Calcium: 8.6 mg/dL (ref 8.6–10.2)
Chloride: 103 mmol/L (ref 96–106)
Creatinine, Ser: 1.03 mg/dL (ref 0.76–1.27)
Globulin, Total: 3.5 g/dL (ref 1.5–4.5)
Glucose: 115 mg/dL — ABNORMAL HIGH (ref 70–99)
Potassium: 4 mmol/L (ref 3.5–5.2)
Sodium: 139 mmol/L (ref 134–144)
Total Protein: 7 g/dL (ref 6.0–8.5)
eGFR: 69 mL/min/{1.73_m2} (ref >59)

## 2022-06-22 LAB — TSH+FREE T4
Free T4: 1.2 ng/dL (ref 0.82–1.77)
TSH: 4.95 u[IU]/mL — ABNORMAL HIGH (ref 0.450–4.500)

## 2022-06-22 MED ORDER — LEVOTHYROXINE SODIUM 100 MCG PO TABS: 100.0000 ug | ORAL_TABLET | Freq: Every day | ORAL | 3 refills | Status: AC

## 2022-10-13 DEATH — deceased
